# Patient Record
Sex: Male | Born: 1940 | Race: White | Hispanic: No | Marital: Married | Smoking: Former smoker
Health system: Southern US, Community
[De-identification: ages and names within clinical notes are randomized; demographics above are authoritative.]

## PROBLEM LIST (undated history)

## (undated) DIAGNOSIS — E785 Hyperlipidemia, unspecified: Secondary | ICD-10-CM

## (undated) DIAGNOSIS — IMO0002 Reserved for concepts with insufficient information to code with codable children: Secondary | ICD-10-CM

## (undated) DIAGNOSIS — H269 Unspecified cataract: Secondary | ICD-10-CM

## (undated) DIAGNOSIS — M25569 Pain in unspecified knee: Secondary | ICD-10-CM

## (undated) DIAGNOSIS — N400 Enlarged prostate without lower urinary tract symptoms: Secondary | ICD-10-CM

## (undated) DIAGNOSIS — I2510 Atherosclerotic heart disease of native coronary artery without angina pectoris: Secondary | ICD-10-CM

## (undated) DIAGNOSIS — H3530 Unspecified macular degeneration: Secondary | ICD-10-CM

## (undated) DIAGNOSIS — Z8719 Personal history of other diseases of the digestive system: Secondary | ICD-10-CM

## (undated) DIAGNOSIS — I509 Heart failure, unspecified: Secondary | ICD-10-CM

## (undated) DIAGNOSIS — E559 Vitamin D deficiency, unspecified: Secondary | ICD-10-CM

## (undated) DIAGNOSIS — Z87442 Personal history of urinary calculi: Secondary | ICD-10-CM

## (undated) DIAGNOSIS — K5730 Diverticulosis of large intestine without perforation or abscess without bleeding: Secondary | ICD-10-CM

## (undated) DIAGNOSIS — I495 Sick sinus syndrome: Secondary | ICD-10-CM

## (undated) DIAGNOSIS — T7840XA Allergy, unspecified, initial encounter: Secondary | ICD-10-CM

## (undated) DIAGNOSIS — I712 Thoracic aortic aneurysm, without rupture, unspecified: Secondary | ICD-10-CM

## (undated) DIAGNOSIS — H8110 Benign paroxysmal vertigo, unspecified ear: Secondary | ICD-10-CM

## (undated) DIAGNOSIS — E786 Lipoprotein deficiency: Secondary | ICD-10-CM

## (undated) DIAGNOSIS — H6990 Unspecified Eustachian tube disorder, unspecified ear: Secondary | ICD-10-CM

## (undated) DIAGNOSIS — J019 Acute sinusitis, unspecified: Secondary | ICD-10-CM

## (undated) DIAGNOSIS — I359 Nonrheumatic aortic valve disorder, unspecified: Secondary | ICD-10-CM

## (undated) DIAGNOSIS — R51 Headache: Secondary | ICD-10-CM

## (undated) DIAGNOSIS — I428 Other cardiomyopathies: Secondary | ICD-10-CM

## (undated) DIAGNOSIS — I10 Essential (primary) hypertension: Secondary | ICD-10-CM

## (undated) DIAGNOSIS — Z860100 Personal history of colon polyps, unspecified: Secondary | ICD-10-CM

## (undated) DIAGNOSIS — M1990 Unspecified osteoarthritis, unspecified site: Secondary | ICD-10-CM

## (undated) DIAGNOSIS — K219 Gastro-esophageal reflux disease without esophagitis: Secondary | ICD-10-CM

## (undated) DIAGNOSIS — M5126 Other intervertebral disc displacement, lumbar region: Secondary | ICD-10-CM

## (undated) DIAGNOSIS — R269 Unspecified abnormalities of gait and mobility: Secondary | ICD-10-CM

## (undated) DIAGNOSIS — L989 Disorder of the skin and subcutaneous tissue, unspecified: Secondary | ICD-10-CM

## (undated) DIAGNOSIS — E119 Type 2 diabetes mellitus without complications: Secondary | ICD-10-CM

## (undated) DIAGNOSIS — H6980 Other specified disorders of Eustachian tube, unspecified ear: Secondary | ICD-10-CM

## (undated) HISTORY — DX: Unspecified cataract: H26.9

## (undated) HISTORY — DX: Disorder of the skin and subcutaneous tissue, unspecified: L98.9

## (undated) HISTORY — DX: Benign prostatic hyperplasia without lower urinary tract symptoms: N40.0

## (undated) HISTORY — DX: Thoracic aortic aneurysm, without rupture: I71.2

## (undated) HISTORY — DX: Lipoprotein deficiency: E78.6

## (undated) HISTORY — DX: Personal history of colonic polyps: Z86.010

## (undated) HISTORY — DX: Reserved for concepts with insufficient information to code with codable children: IMO0002

## (undated) HISTORY — DX: Other intervertebral disc displacement, lumbar region: M51.26

## (undated) HISTORY — DX: Nonrheumatic aortic valve disorder, unspecified: I35.9

## (undated) HISTORY — DX: Diverticulosis of large intestine without perforation or abscess without bleeding: K57.30

## (undated) HISTORY — DX: Essential (primary) hypertension: I10

## (undated) HISTORY — PX: TONSILLECTOMY: SUR1361

## (undated) HISTORY — DX: Personal history of colon polyps, unspecified: Z86.0100

## (undated) HISTORY — DX: Thoracic aortic aneurysm, without rupture, unspecified: I71.20

## (undated) HISTORY — DX: Unspecified macular degeneration: H35.30

## (undated) HISTORY — DX: Unspecified abnormalities of gait and mobility: R26.9

## (undated) HISTORY — DX: Personal history of other diseases of the digestive system: Z87.19

## (undated) HISTORY — DX: Heart failure, unspecified: I50.9

## (undated) HISTORY — DX: Vitamin D deficiency, unspecified: E55.9

## (undated) HISTORY — DX: Atherosclerotic heart disease of native coronary artery without angina pectoris: I25.10

## (undated) HISTORY — DX: Pain in unspecified knee: M25.569

## (undated) HISTORY — PX: CATARACT EXTRACTION, BILATERAL: SHX1313

## (undated) HISTORY — DX: Gastro-esophageal reflux disease without esophagitis: K21.9

## (undated) HISTORY — DX: Acute sinusitis, unspecified: J01.90

## (undated) HISTORY — PX: COLONOSCOPY W/ POLYPECTOMY: SHX1380

## (undated) HISTORY — DX: Benign paroxysmal vertigo, unspecified ear: H81.10

## (undated) HISTORY — DX: Sick sinus syndrome: I49.5

## (undated) HISTORY — PX: OTHER SURGICAL HISTORY: SHX169

## (undated) HISTORY — DX: Hyperlipidemia, unspecified: E78.5

## (undated) HISTORY — DX: Other cardiomyopathies: I42.8

## (undated) HISTORY — DX: Unspecified eustachian tube disorder, unspecified ear: H69.90

## (undated) HISTORY — DX: Other specified disorders of Eustachian tube, unspecified ear: H69.80

## (undated) HISTORY — DX: Headache: R51

## (undated) HISTORY — DX: Allergy, unspecified, initial encounter: T78.40XA

---
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
## 2003-03-29 ENCOUNTER — Encounter: Payer: Self-pay | Admitting: Family Medicine

## 2005-03-24 HISTORY — PX: DOBUTAMINE STRESS ECHO: SHX5426

## 2006-01-22 HISTORY — PX: ESOPHAGOGASTRODUODENOSCOPY: SHX1529

## 2006-04-02 ENCOUNTER — Encounter: Payer: Self-pay | Admitting: Family Medicine

## 2006-12-23 ENCOUNTER — Ambulatory Visit: Payer: Self-pay | Admitting: Family Medicine

## 2006-12-23 DIAGNOSIS — R51 Headache: Secondary | ICD-10-CM | POA: Insufficient documentation

## 2006-12-23 DIAGNOSIS — Z8719 Personal history of other diseases of the digestive system: Secondary | ICD-10-CM | POA: Insufficient documentation

## 2006-12-23 DIAGNOSIS — I10 Essential (primary) hypertension: Secondary | ICD-10-CM | POA: Insufficient documentation

## 2006-12-23 DIAGNOSIS — R519 Headache, unspecified: Secondary | ICD-10-CM | POA: Insufficient documentation

## 2006-12-23 DIAGNOSIS — N400 Enlarged prostate without lower urinary tract symptoms: Secondary | ICD-10-CM | POA: Insufficient documentation

## 2006-12-23 DIAGNOSIS — I152 Hypertension secondary to endocrine disorders: Secondary | ICD-10-CM | POA: Insufficient documentation

## 2007-06-24 ENCOUNTER — Encounter: Payer: Self-pay | Admitting: Cardiovascular Disease

## 2007-06-24 ENCOUNTER — Ambulatory Visit: Payer: Self-pay | Admitting: Family Medicine

## 2007-06-24 DIAGNOSIS — H8110 Benign paroxysmal vertigo, unspecified ear: Secondary | ICD-10-CM | POA: Insufficient documentation

## 2007-06-24 DIAGNOSIS — I495 Sick sinus syndrome: Secondary | ICD-10-CM | POA: Insufficient documentation

## 2007-07-30 ENCOUNTER — Ambulatory Visit: Payer: Self-pay | Admitting: Cardiology

## 2007-10-08 DIAGNOSIS — I359 Nonrheumatic aortic valve disorder, unspecified: Secondary | ICD-10-CM | POA: Insufficient documentation

## 2007-10-08 DIAGNOSIS — I429 Cardiomyopathy, unspecified: Secondary | ICD-10-CM | POA: Insufficient documentation

## 2007-10-08 DIAGNOSIS — I428 Other cardiomyopathies: Secondary | ICD-10-CM

## 2007-12-06 ENCOUNTER — Ambulatory Visit: Payer: Self-pay | Admitting: Family Medicine

## 2007-12-08 LAB — CONVERTED CEMR LAB
ALT: 19 units/L (ref 0–53)
AST: 24 units/L (ref 0–37)
Albumin: 4 g/dL (ref 3.5–5.2)
Alkaline Phosphatase: 63 units/L (ref 39–117)
BUN: 22 mg/dL (ref 6–23)
Bilirubin, Direct: 0.1 mg/dL (ref 0.0–0.3)
CO2: 30 meq/L (ref 19–32)
Calcium: 9.2 mg/dL (ref 8.4–10.5)
Chloride: 110 meq/L (ref 96–112)
Cholesterol: 171 mg/dL (ref 0–200)
Creatinine, Ser: 1.1 mg/dL (ref 0.4–1.5)
GFR calc Af Amer: 86 mL/min
GFR calc non Af Amer: 71 mL/min
Glucose, Bld: 100 mg/dL — ABNORMAL HIGH (ref 70–99)
HDL: 33.6 mg/dL — ABNORMAL LOW (ref >39.0)
LDL Cholesterol: 123 mg/dL — ABNORMAL HIGH (ref 0–99)
PSA: 0.29 ng/mL (ref 0.10–4.00)
Potassium: 4.2 meq/L (ref 3.5–5.1)
Sodium: 141 meq/L (ref 135–145)
Total Bilirubin: 1.4 mg/dL — ABNORMAL HIGH (ref 0.3–1.2)
Total CHOL/HDL Ratio: 5.1
Total Protein: 7 g/dL (ref 6.0–8.3)
Triglycerides: 73 mg/dL (ref 0–149)
VLDL: 15 mg/dL (ref 0–40)

## 2007-12-09 ENCOUNTER — Ambulatory Visit: Payer: Self-pay | Admitting: Family Medicine

## 2007-12-09 DIAGNOSIS — E786 Lipoprotein deficiency: Secondary | ICD-10-CM | POA: Insufficient documentation

## 2007-12-09 DIAGNOSIS — L989 Disorder of the skin and subcutaneous tissue, unspecified: Secondary | ICD-10-CM | POA: Insufficient documentation

## 2007-12-09 LAB — CONVERTED CEMR LAB
Cholesterol, target level: 200 mg/dL
HDL goal, serum: 40 mg/dL
LDL Goal: 130 mg/dL

## 2008-01-24 ENCOUNTER — Ambulatory Visit: Payer: Self-pay | Admitting: Family Medicine

## 2008-01-24 DIAGNOSIS — IMO0002 Reserved for concepts with insufficient information to code with codable children: Secondary | ICD-10-CM | POA: Insufficient documentation

## 2008-01-24 DIAGNOSIS — M5126 Other intervertebral disc displacement, lumbar region: Secondary | ICD-10-CM | POA: Insufficient documentation

## 2008-02-03 ENCOUNTER — Encounter: Payer: Self-pay | Admitting: Family Medicine

## 2008-02-07 ENCOUNTER — Ambulatory Visit: Payer: Self-pay | Admitting: Family Medicine

## 2008-04-04 ENCOUNTER — Ambulatory Visit: Payer: Self-pay | Admitting: Cardiovascular Disease

## 2008-04-25 ENCOUNTER — Encounter: Payer: Self-pay | Admitting: Cardiovascular Disease

## 2008-04-25 ENCOUNTER — Ambulatory Visit: Payer: Self-pay

## 2008-07-20 ENCOUNTER — Ambulatory Visit: Payer: Self-pay | Admitting: Family Medicine

## 2008-07-20 DIAGNOSIS — IMO0002 Reserved for concepts with insufficient information to code with codable children: Secondary | ICD-10-CM | POA: Insufficient documentation

## 2008-07-20 DIAGNOSIS — M25569 Pain in unspecified knee: Secondary | ICD-10-CM | POA: Insufficient documentation

## 2008-09-01 DIAGNOSIS — E7800 Pure hypercholesterolemia, unspecified: Secondary | ICD-10-CM | POA: Insufficient documentation

## 2008-09-26 ENCOUNTER — Encounter: Payer: Self-pay | Admitting: Cardiovascular Disease

## 2008-09-26 ENCOUNTER — Ambulatory Visit: Payer: Self-pay | Admitting: Cardiovascular Disease

## 2008-09-29 ENCOUNTER — Ambulatory Visit: Payer: Self-pay | Admitting: Cardiology

## 2008-09-29 ENCOUNTER — Encounter: Payer: Self-pay | Admitting: Cardiovascular Disease

## 2008-10-17 LAB — CONVERTED CEMR LAB
Cholesterol: 187 mg/dL (ref 0–200)
HDL: 43 mg/dL (ref >39)
LDL Cholesterol: 126 mg/dL — ABNORMAL HIGH (ref 0–99)
Total CHOL/HDL Ratio: 4.3
Triglycerides: 92 mg/dL (ref <150)
VLDL: 18 mg/dL (ref 0–40)

## 2008-11-21 ENCOUNTER — Ambulatory Visit: Payer: Self-pay | Admitting: Family Medicine

## 2008-11-22 LAB — CONVERTED CEMR LAB
Basophils Absolute: 0.1 10*3/uL (ref 0.0–0.1)
Basophils Relative: 0.8 % (ref 0.0–3.0)
Eosinophils Absolute: 0.1 10*3/uL (ref 0.0–0.7)
Eosinophils Relative: 1.6 % (ref 0.0–5.0)
HCT: 38.7 % — ABNORMAL LOW (ref 39.0–52.0)
Hemoglobin: 13 g/dL (ref 13.0–17.0)
Lymphocytes Relative: 23.7 % (ref 12.0–46.0)
Lymphs Abs: 1.7 10*3/uL (ref 0.7–4.0)
MCHC: 33.5 g/dL (ref 30.0–36.0)
MCV: 93.1 fL (ref 78.0–100.0)
Monocytes Absolute: 0.7 10*3/uL (ref 0.1–1.0)
Monocytes Relative: 9.8 % (ref 3.0–12.0)
Neutro Abs: 4.7 10*3/uL (ref 1.4–7.7)
Neutrophils Relative %: 64.1 % (ref 43.0–77.0)
Platelets: 182 10*3/uL (ref 150.0–400.0)
RBC: 4.16 M/uL — ABNORMAL LOW (ref 4.22–5.81)
RDW: 11.9 % (ref 11.5–14.6)
WBC: 7.3 10*3/uL (ref 4.5–10.5)

## 2008-11-28 ENCOUNTER — Ambulatory Visit: Payer: Self-pay | Admitting: Family Medicine

## 2008-12-01 ENCOUNTER — Telehealth: Payer: Self-pay | Admitting: Family Medicine

## 2008-12-04 ENCOUNTER — Ambulatory Visit: Payer: Self-pay | Admitting: Internal Medicine

## 2008-12-15 ENCOUNTER — Telehealth: Payer: Self-pay | Admitting: Internal Medicine

## 2008-12-19 ENCOUNTER — Ambulatory Visit: Payer: Self-pay | Admitting: Internal Medicine

## 2008-12-19 LAB — HM COLONOSCOPY

## 2009-01-08 ENCOUNTER — Ambulatory Visit: Payer: Self-pay | Admitting: Family Medicine

## 2009-04-05 ENCOUNTER — Encounter: Payer: Self-pay | Admitting: Cardiovascular Disease

## 2009-04-05 ENCOUNTER — Ambulatory Visit: Payer: Self-pay

## 2009-04-13 ENCOUNTER — Ambulatory Visit: Payer: Self-pay | Admitting: Cardiovascular Disease

## 2009-04-16 ENCOUNTER — Ambulatory Visit: Payer: Self-pay | Admitting: Cardiovascular Disease

## 2009-04-19 ENCOUNTER — Encounter: Payer: Self-pay | Admitting: Cardiovascular Disease

## 2009-04-19 LAB — CONVERTED CEMR LAB
Cholesterol: 174 mg/dL (ref 0–200)
HDL: 37 mg/dL — ABNORMAL LOW (ref >39)
LDL Cholesterol: 121 mg/dL — ABNORMAL HIGH (ref 0–99)
Total CHOL/HDL Ratio: 4.7
Triglycerides: 78 mg/dL (ref <150)
VLDL: 16 mg/dL (ref 0–40)

## 2009-04-24 ENCOUNTER — Telehealth: Payer: Self-pay | Admitting: Family Medicine

## 2009-04-26 ENCOUNTER — Ambulatory Visit: Payer: Self-pay | Admitting: Family Medicine

## 2009-04-26 LAB — CONVERTED CEMR LAB
ALT: 21 units/L (ref 0–53)
AST: 24 units/L (ref 0–37)
Albumin: 4.3 g/dL (ref 3.5–5.2)
Alkaline Phosphatase: 77 units/L (ref 39–117)
BUN: 19 mg/dL (ref 6–23)
Basophils Absolute: 0.1 10*3/uL (ref 0.0–0.1)
CO2: 29 meq/L (ref 19–32)
Chloride: 105 meq/L (ref 96–112)
Eosinophils Absolute: 0.6 10*3/uL (ref 0.0–0.7)
Glucose, Bld: 108 mg/dL — ABNORMAL HIGH (ref 70–99)
Lymphocytes Relative: 28.3 % (ref 12.0–46.0)
MCHC: 32.8 g/dL (ref 30.0–36.0)
Monocytes Relative: 9.8 % (ref 3.0–12.0)
PSA: 0.32 ng/mL (ref 0.10–4.00)
Platelets: 182 10*3/uL (ref 150.0–400.0)
Potassium: 4.5 meq/L (ref 3.5–5.1)
RDW: 13.1 % (ref 11.5–14.6)
TSH: 1.69 microintl units/mL (ref 0.35–5.50)
Total Protein: 8.1 g/dL (ref 6.0–8.3)

## 2009-04-27 ENCOUNTER — Ambulatory Visit: Payer: Self-pay | Admitting: Family Medicine

## 2009-04-27 DIAGNOSIS — R269 Unspecified abnormalities of gait and mobility: Secondary | ICD-10-CM | POA: Insufficient documentation

## 2009-04-27 DIAGNOSIS — H6981 Other specified disorders of Eustachian tube, right ear: Secondary | ICD-10-CM | POA: Insufficient documentation

## 2009-04-30 ENCOUNTER — Encounter: Payer: Self-pay | Admitting: Family Medicine

## 2009-05-01 DIAGNOSIS — E559 Vitamin D deficiency, unspecified: Secondary | ICD-10-CM | POA: Insufficient documentation

## 2009-05-01 LAB — CONVERTED CEMR LAB: TSH: 1.134 microintl units/mL (ref 0.350–4.500)

## 2009-05-17 ENCOUNTER — Telehealth: Payer: Self-pay | Admitting: Cardiovascular Disease

## 2009-07-24 ENCOUNTER — Telehealth: Payer: Self-pay | Admitting: Family Medicine

## 2009-10-12 ENCOUNTER — Ambulatory Visit: Payer: Self-pay | Admitting: Cardiovascular Disease

## 2009-10-12 DIAGNOSIS — I712 Thoracic aortic aneurysm, without rupture, unspecified: Secondary | ICD-10-CM | POA: Insufficient documentation

## 2010-02-25 ENCOUNTER — Encounter (INDEPENDENT_AMBULATORY_CARE_PROVIDER_SITE_OTHER): Payer: Self-pay | Admitting: *Deleted

## 2010-02-25 ENCOUNTER — Telehealth: Payer: Self-pay | Admitting: Cardiovascular Disease

## 2010-03-04 ENCOUNTER — Ambulatory Visit: Payer: Self-pay | Admitting: Family Medicine

## 2010-03-04 DIAGNOSIS — J019 Acute sinusitis, unspecified: Secondary | ICD-10-CM | POA: Insufficient documentation

## 2010-03-12 ENCOUNTER — Ambulatory Visit (HOSPITAL_COMMUNITY)
Admission: RE | Admit: 2010-03-12 | Discharge: 2010-03-12 | Payer: Self-pay | Source: Home / Self Care | Attending: Cardiovascular Disease | Admitting: Cardiovascular Disease

## 2010-03-21 DIAGNOSIS — I2510 Atherosclerotic heart disease of native coronary artery without angina pectoris: Secondary | ICD-10-CM | POA: Insufficient documentation

## 2010-03-26 ENCOUNTER — Ambulatory Visit
Admission: RE | Admit: 2010-03-26 | Discharge: 2010-03-26 | Payer: Self-pay | Source: Home / Self Care | Attending: Cardiovascular Disease | Admitting: Cardiovascular Disease

## 2010-03-27 LAB — CONVERTED CEMR LAB
BUN: 16 mg/dL (ref 6–23)
Calcium: 9.1 mg/dL (ref 8.4–10.5)
Creatinine, Ser: 0.8 mg/dL (ref 0.4–1.5)
GFR calc non Af Amer: 96.24 mL/min (ref >60.00)
Potassium: 3.9 meq/L (ref 3.5–5.1)

## 2010-03-28 ENCOUNTER — Ambulatory Visit
Admission: RE | Admit: 2010-03-28 | Discharge: 2010-03-28 | Payer: Self-pay | Source: Home / Self Care | Attending: Cardiovascular Disease | Admitting: Cardiovascular Disease

## 2010-03-29 ENCOUNTER — Other Ambulatory Visit: Payer: Self-pay | Admitting: Cardiovascular Disease

## 2010-03-29 ENCOUNTER — Ambulatory Visit
Admission: RE | Admit: 2010-03-29 | Discharge: 2010-03-29 | Payer: Self-pay | Source: Home / Self Care | Attending: Cardiovascular Disease | Admitting: Cardiovascular Disease

## 2010-03-29 LAB — LIPID PANEL
Cholesterol: 129 mg/dL (ref 0–200)
HDL: 33.9 mg/dL — ABNORMAL LOW (ref >39.00)
LDL Cholesterol: 85 mg/dL (ref 0–99)
Total CHOL/HDL Ratio: 4
Triglycerides: 49 mg/dL (ref 0.0–149.0)
VLDL: 9.8 mg/dL (ref 0.0–40.0)

## 2010-03-29 LAB — HEPATIC FUNCTION PANEL
ALT: 19 U/L (ref 0–53)
AST: 20 U/L (ref 0–37)
Albumin: 3.6 g/dL (ref 3.5–5.2)
Alkaline Phosphatase: 68 U/L (ref 39–117)
Bilirubin, Direct: 0.3 mg/dL (ref 0.0–0.3)
Total Bilirubin: 1.5 mg/dL — ABNORMAL HIGH (ref 0.3–1.2)
Total Protein: 6.4 g/dL (ref 6.0–8.3)

## 2010-04-05 ENCOUNTER — Telehealth: Payer: Self-pay | Admitting: Cardiovascular Disease

## 2010-04-24 NOTE — Miscellaneous (Signed)
  Clinical Lists Changes  Medications: Added new medication of CRESTOR 10 MG TABS (ROSUVASTATIN CALCIUM) Take one tablet by mouth daily.

## 2010-04-24 NOTE — Progress Notes (Signed)
Summary: RX  Phone Note Refill Request Call back at Home Phone [phone] Message from:  Patient on May 17, 2009 12:11 PM  Refills Requested: Medication #1:  CRESTOR 10 MG TABS Take one tablet by mouth daily. [facility] [address] [place]  Initial call taken by: Harlon Flor,  May 17, 2009 12:12 PM    Prescriptions: CRESTOR 10 MG TABS (ROSUVASTATIN CALCIUM) Take one tablet by mouth daily.  #30 x 6   Entered by:   Mercer Pod   Authorized by:   Verne Carrow, MD   Signed by:   Mercer Pod on 05/17/2009   Method used:   Electronically to        [facility] [phone]* (retail)       [address]       [place], [place]  [address]       Ph: [phone]       Fax: [phone]   RxID:   [id number]

## 2010-04-24 NOTE — Assessment & Plan Note (Signed)
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  Hyperlipidemia, poor control: Recnetly started on Crestor10 mg daily.  Prediabetes: glucose increased from last check.    Occ episodes  of "sinking feeling" Sudden loss of energy...lightheaded.  2 episodes in last 6 weeks...lasts seconds.  More frequent when weather bad.  No new medicaitons. No relationship to meals.  CBC, TSH, CMET   Increase in loss of balance in last year, gradual...cannot stand on one leg when dressing, trips over own feet intermittantly. Depth perceptions off some too.  No numbness Fingers and toes cold. No weakness in muscles exccept maybe slightly decrease gripping. Has noted occ memory loss..not severe.  Difficulty recaxlling long term hisotry.  No issue with current events.   Has longterm issues with congestion, inner ear issues...occ feels like fluid in ears. Years ago saw ENT.  Rash at waist sincce August , mild puritics. No blisters. Red, more so after hot shower.No pain.    Hypertension History:      Positive major cardiovascular risk factors include male age 73 years old or older, hyperlipidemia, and hypertension.  Negative major cardiovascular risk factors include no history of diabetes, negative family history for ischemic heart disease, and non-tobacco-user status.        Positive history for target organ damage include ASHD (either angina/prior MI/prior CABG).  Further assessment for target organ damage reveals no history of stroke/TIA, peripheral vascular disease, renal insufficiency, or hypertensive  retinopathy.     Problems Prior to Update: 1)  Sprain and Strain of Unspecified Site of Back  (ICD-847.9) 2)  Sprain and Strain of Ribs  (ICD-848.3) 3)  Change in Bowels  (ICD-787.99) 4)  Hematochezia  (ICD-578.1) 5)  Diverticulitis, Colon  (ICD-562.11) 6)  Cad, Unspecified Site  (ICD-414.00) 7)  Aortic Insufficiency  (ICD-424.1) 8)  Sinus Bradycardia  (ICD-427.81) 9)  Low Hdl  (ICD-272.5) 10)  Hx of Cardiomyopathy, Primary  (ICD-425.4) 11)  Hypertension  (ICD-401.9) 12)  Hyperlipidemia-mixed  (ICD-272.4) 13)  Herniated Lumbar Disc  (ICD-722.10) 14)  Skin Lesions, Multiple  (ICD-709.9) 15)  Benign Positional Vertigo  (ICD-386.11) 16)  Headache  (ICD-784.0) 17)  Benign Prostatic Hypertrophy  (ICD-600.00) 18)  Knee Pain, Right  (ICD-719.46) 19)  Anserine Bursitis, Right  (ICD-726.61) 20)  Back Pain, Lumbar, With Radiculopathy  (ICD-724.4) 21)  Diverticulitis, Hx of  (ICD-V12.79) 22)  Adenomatous Colonic Polyps, Hx of  (ICD-V12.72)  Current Medications (verified): 1)  Ramipril 2.5 Mg  Caps (Ramipril) .... Take 1 Tablet By Mouth Once A Day 2)  Finasteride 5 Mg  Tabs (Finasteride) .... Take 1 Tablet By Mouth Once A Day 3)  Terazosin Hcl 10 Mg Caps (Terazosin Hcl) .... Take 1 Tablet By Mouth Once A Day 4)  Loratadine-D 24hr 10-240 Mg  Tb24 (Loratadine-Pseudoephedrine) .... Take 1 Tablet By Mouth Once A Day 5)  Bayer Low Strength 81 Mg  Tbec (Aspirin) .... Take 1 Tablet By Mouth Once A Day 6)  Glucosamine-Chondroitin 1500-1200 Mg/72ml  Liqd (  Glucosamine-Chondroitin) .... Take 1 Tablet By Mouth Once A Day 7)  Red Yeast Rice 600 Mg Tabs (Red Yeast Rice Extract) .... Takes 2 Tablets By Mouth Once Daily 8)  Fish Oil 1200 Mg Caps (Omega-3 Fatty Acids) .... Take One By Mouth Once Daily 9)  Crestor 10 Mg Tabs (Rosuvastatin Calcium) .... Take One Tablet By Mouth Daily.  Allergies: 1)  ! Sulfa 2)  ! Codeine  Past History:  Past medical, surgical, family and social histories (including  risk factors) reviewed, and no changes noted (except as noted below).  Past Medical History: Reviewed history from 09/01/2008 and no changes required. CAD, UNSPECIFIED SITE (ICD-414.00) AORTIC INSUFFICIENCY (ICD-424.1) SINUS BRADYCARDIA (ICD-427.81) LOW HDL (ICD-272.5) Hx of CARDIOMYOPATHY, PRIMARY (ICD-425.4) HYPERTENSION (ICD-401.9) HYPERLIPIDEMIA-MIXED (ICD-272.4) HERNIATED LUMBAR DISC (ICD-722.10) SKIN LESIONS, MULTIPLE (ICD-709.9) BENIGN POSITIONAL VERTIGO (ICD-386.11) HEADACHE (ICD-784.0) BENIGN PROSTATIC HYPERTROPHY (ICD-600.00) KNEE PAIN, RIGHT (ICD-719.46) ANSERINE BURSITIS, RIGHT (ICD-726.61) BACK PAIN, LUMBAR, WITH RADICULOPATHY (ICD-724.4) DIVERTICULITIS, HX OF (ICD-V12.79) COLONIC POLYPS, HX OF (ICD-V12.72)  Past Surgical History: Reviewed history from 12/04/2008 and no changes required. 03/2003 CT chest neg for PE 07/2003  hosp for chest pain, neg heart cath 09/2003 sleep study 03/2005 stress echo: lateral hypokinesis but no ischemia  PFTs showed diminished lung capacity 2003 PFTS stable in 2005  L knee surgery 1947 01/2006 EGD: Schatzki's ring, non bleeding erosive gastropathy [place]  Family History: Reviewed history from 12/04/2008 and no changes required. father died 65 lung cancer mother died age 67 CHF, CAD, pacemaker, CVA for coumadin 1 sister breast cancer 1 brother overweight MGM: CHF No FH of Colon Cancer:  Social History: Reviewed history from 09/01/2008 and no changes required. 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 CV:  Denies chest pain or discomfort and swelling of feet. Resp:  Denies shortness of breath. GI:  Denies abdominal pain, bloody stools, constipation, and  diarrhea. GU:  Denies dysuria.  Physical Exam  General:  elderly male in NAD  Head:  no maxillary sinus ttp Ears:  External ear exam shows no significant lesions or deformities.  Clear fluid B TMs Nose:  External nasal examination shows no deformity or inflammation. Nasal mucosa are pink and moist without lesions or exudates. Mouth:  Oral mucosa and oropharynx without lesions or exudates.  Teeth in good repair. Neck:  no carotid bruit or thyromegaly no cervical or supraclavicular lymphadenopathy  Lungs:  Normal respiratory effort, chest expands symmetrically. Lungs are clear to auscultation, no crackles or wheezes. Heart:  Normal rate and regular rhythm. S1 and S2 normal without gallop, murmur, click, rub or other extra sounds. Abdomen:  Bowel sounds positive,abdomen soft and non-tender without masses, organomegaly or hernias noted. Rectal:  No external abnormalities noted. Normal sphincter tone. No rectal masses or tenderness. Genitalia:  Testes bilaterally descended without nodularity, tenderness or masses. No scrotal masses or lesions. No penis lesions or urethral discharge. Prostate:  Prostate gland firm and smooth, no enlargement, nodularity, tenderness, mass, asymmetry or induration. Pulses:  R and L posterior tibial pulses are full and equal bilaterally  Extremities:  no edema  Neurologic:  No cranial nerve deficits noted. Station and gait are normal....except cxannot stand on one leg.   Plantar reflexes are down-going bilaterally. DTRs are symmetrical throughout. Sensory, motor and coordinative  functions appear intact.gait normal, DTRs symmetrical and normal, finger-to-nose normal, and heel-to-shin normal.      Impression & Recommendations:  Problem # 1:  GAIT IMBALANCE (ICD-781.2) Less issue with gait and more balancing on one foot.. No other suggestion of cerebellar issues on neuro exam. Nml neuro exam otherwise. ? due to #2. Less likely CVA/TIA but possible. No evidence on exam  of mass lesion...if not imrpoving and labs negative consider MRI brain vs neuro referral.  ? proprioception issue..? caused by knee problems.  Orders: T-Sed Rate (Automated) [phone]) T- * Misc. 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 continbuting to dizzy spells.Marland Kitchenand balance issues..will treat with nasal steroid and follow up in 1 month.  If not better consider ENT referral.   Problem # 3:  CAD, UNSPECIFIED SITE (ICD-414.00) stable per CARDS His updated medication list for this problem includes:    Ramipril 2.5 Mg Caps (Ramipril) .[name]... Take 1 tablet by mouth once a day    Terazosin Hcl 10 Mg Caps (Terazosin hcl) .[name]... Take 1 tablet by mouth once a day    Bayer Low Strength 81 Mg Tbec (Aspirin) .[name]... Take 1 tablet by mouth once a day  Problem # 4:  HYPERTENSION (ICD-401.9)  Well controlled at home on current meds.  His updated medication list for this problem includes:    Ramipril 2.5 Mg Caps (Ramipril) .[name]... Take 1 tablet by mouth once a day    Terazosin Hcl 10 Mg Caps (Terazosin hcl) .[name]... Take 1 tablet by mouth once a day  BP today: 110/90 Prior BP: 104/76 (04/13/2009)  10 Yr Risk Heart Disease: N/A Prior 10 Yr Risk Heart Disease: 22 % (12/09/2007)  Labs Reviewed: K+: 4.5 (04/26/2009) Creat: : 1.1 (04/26/2009)   Chol: 174 (04/16/2009)   HDL: 37 (04/16/2009)   LDL: 121 (04/16/2009)   TG: 78 (04/16/2009)  Problem # 5:  HYPERLIPIDEMIA-MIXED (ICD-272.4) Poor control..started now on crestor. Cards to recehck in 6 months.  His updated medication list for this problem includes:    Crestor 10 Mg Tabs (Rosuvastatin calcium) .[name]... Take one tablet by mouth daily.  Complete Medication List: 1)  Ramipril 2.5 Mg Caps (Ramipril) .... Take 1 tablet by mouth once a day 2)  Finasteride 5 Mg Tabs (Finasteride) .... Take 1 tablet by mouth once a day 3)  Terazosin Hcl 10 Mg Caps (Terazosin hcl) .... Take 1  tablet by mouth once a day 4)  Loratadine-d 24hr 10-240 Mg Tb24 (Loratadine-pseudoephedrine) .... Take 1 tablet by mouth once a day 5)  Bayer Low Strength 81 Mg Tbec (Aspirin) .... Take 1 tablet by mouth once a day 6)  Glucosamine-chondroitin 1500-1200 Mg/26ml Liqd (Glucosamine-chondroitin) .... Take 1 tablet by mouth once a day 7)  Fish Oil 1200 Mg Caps (Omega-3 fatty acids) .... Take one by mouth once daily 8)  Crestor 10 Mg Tabs (Rosuvastatin calcium) .... Take one tablet by mouth daily. 9)  Fluticasone Propionate 50 Mcg/act Susp (Fluticasone propionate) .... 2 sprays nasal daily  Hypertension Assessment/Plan:      The patient's hypertensive risk group is category C: Target organ damage and/or diabetes.  Today's blood pressure is 110/90.  His blood pressure goal is < 140/90.  Patient Instructions: 1)  Call insurance about shingles vaccine.  2)  Please schedule a follow-up appointment in 1 month 30 min balance issues.  3)  Call if any new neurologic symptoms.  Prescriptions: FLUTICASONE PROPIONATE 50 MCG/ACT SUSP (FLUTICASONE PROPIONATE)  2 sprays nasal daily  #1 x 3   Entered and Authorized by:   Kerby Nora MD   Signed by:   Kerby Nora MD on 04/27/2009   Method used:   Electronically to        [facility] [phone]* (retail)       [address]       [place], [place]  [address]       Ph: [phone]       Fax: [phone]   RxID:   [id number]   Current Allergies (reviewed today): ! SULFA ! CODEINE  Last Flu Vaccine:  Fluvax 3+ (12/09/2007 2:22:22 PM) Flu Vaccine Result Date:  12/22/2008 Flu Vaccine Result:  given Flu Vaccine Next Due:  1 yr Hemoccult Next Due:  Not Indicated Last PSA Result:  0.32 (04/26/2009 9:08:29 AM) PSA Next Due:  1 yr  Appended Document: CPX

## 2010-04-24 NOTE — Progress Notes (Signed)
Summary: order for cardiac mri?  Phone Note Call from Patient   Caller: Patient [phone] Reason for Call: Talk to Nurse Summary of Call: pt calling to see if he needs a cardiac mri before next appt Initial call taken by: Glynda Jaeger,  February 25, 2010 11:20 AM

## 2010-04-24 NOTE — Progress Notes (Signed)
Summary: REFILL  Phone Note Refill Request Message from:  Scriptline on Jul 24, 2009 8:23 AM  Refills Requested: Medication #1:  DIFLUNEC SODIUM   Supply Requested: 1 month [facility] [address]   Method Requested: Electronic Initial call taken by: Benny Lennert CMA Duncan Dull),  Jul 24, 2009 8:24 AM  Follow-up for Phone Call        ? diclofenac? yes sorry cant spell Follow-up by: Kerby Nora MD,  Jul 24, 2009 8:33 AM    New/Updated Medications: DICLOFENAC SODIUM 75 MG TBEC (DICLOFENAC SODIUM) 1 tab by mouth two times a day Prescriptions: DICLOFENAC SODIUM 75 MG TBEC (DICLOFENAC SODIUM) 1 tab by mouth two times a day  #60 x 0   Entered and Authorized by:   Kerby Nora MD   Signed by:   Kerby Nora MD on 07/24/2009   Method used:   Electronically to        [facility] [phone]* (retail)       [address]       [place], [place]  [address]       Ph: [phone]       Fax: [phone]   RxID:   [id number]

## 2010-04-24 NOTE — Letter (Signed)
Summary: Appointment - Cardiac MRI  [facility], Main Office  [address]   [place], [place] [address]   Phone: [phone]  Fax: [phone]      February 25, 2010 MRN: [id number]   Dominic Kane [address] [place], [place]  [address]   Dear Mr. Gowin,   We have scheduled the above patient for an appointment for a Cardiac MRI on 03-12-2010 at 1:00 p.m.  Please refer to the below information for the location and instructions for this test:  Location:     [facility]       [address]       [place], [place]  [address] Instructions:    Wilmon Arms at [facility] Outpatient Registration 45 minutes prior to your appointment time.  This will ensure you are in the Radiology Department 30 minutes prior to your appointment.    There are no restrictions for this test you may eat and take medications as usual.  If you need to reschedule this appointment please call at the number listed above.  Sincerely,      Lorne Skeens  [facility] Scheduling Team

## 2010-04-24 NOTE — Assessment & Plan Note (Signed)
Summary: f24m   Visit Type:  Follow-up Referring Provider:  Kerby Nora, MD Primary Provider:  Kerby Nora, MD  CC:  no cp, knee edema from arthritis, and no sob.  History of Present Illness: Dominic Kane is a pleasant 70 year old Caucasian male with a past medical history significant for hypertension, nonobstructive coronary artery disease, idiopathic cardiomyopathy, benign prostatic hypertrophy, hyperlipidemia and hiatal hernia, who presents today for routine cardiac followup. Dominic Kane was seen most recently in our office six months ago.   Upon reviewing all records at the last visit, it seems that Dominic Kane had an episode of chest pain about 5 years ago; at which time, he was evaluated by the [facility] in [place], [place].  He was apparently known to have an ejection fraction of 35-40%, which was felt to be idiopathic.  Left heart catheterization was performed in May 2005 and showed diffuse 15% irregularities in the LAD, diffuse 20% irregularities in the ramus intermedius branch, diffuse 30% irregularities in the nondominant circumflex, and a 50% stenosis in the proximal RCA with irregularities in the midportion of the vessel.  The ascending aorta was mildly dilated with no evidence of dissection during that catheterization.  Ejection fraction was noted to be 65%.  An echocardiogram during that admission in May 2005 in [place] showed an ejection fraction of 55-60% with the ascending aorta at the sinotubular junction measuring 4.1 cm.  We got an echo in February 2010 which showed mild systolic and diastolic dysfunction with LVEF of 45%. There was mild to moderate aortic stenosis.  Follow up echo on January 13,2011  with mild global LV dysfunction, EF 45%, no significant aortic stenosis, mild aortic regurgitation and mildly dilated aortic root (stable in size).    He has been doing well. He denies any chest pain, SOB, DOE, palpitations, near syncope, syncope, orthopnea, PND, LE edema.  He has been watching his diet and eats few fatty foods. His knee has been bothering him but no other complaints.   Current Medications (verified): 1)  Ramipril 2.5 Mg  Caps (Ramipril) .... Take 1 Tablet By Mouth Once A Day 2)  Finasteride 5 Mg  Tabs (Finasteride) .... Take 1 Tablet By Mouth Once A Day 3)  Terazosin Hcl 10 Mg Caps (Terazosin Hcl) .... Take 1 Tablet By Mouth Once A Day 4)  Loratadine-D 24hr 10-240 Mg  Tb24 (Loratadine-Pseudoephedrine) .... Take 1 Tablet By Mouth Once A Day 5)  Bayer Low Strength 81 Mg  Tbec (Aspirin) .... Take 1 Tablet By Mouth Once A Day 6)  Glucosamine-Chondroitin 1500-1200 Mg/83ml  Liqd (Glucosamine-Chondroitin) .... Take 1 Tablet By Mouth Once A Day 7)  Red Yeast Rice 600 Mg Tabs (Red Yeast Rice Extract) .... Takes 2 Tablets By Mouth Once Daily 8)  Fish Oil 1200 Mg Caps (Omega-3 Fatty Acids) .... Take One By Mouth Once Daily  Allergies (verified): 1)  ! Sulfa 2)  ! Codeine  Past History:  Past Medical History: Reviewed history from 09/01/2008 and no changes required. CAD, UNSPECIFIED SITE (ICD-414.00) AORTIC INSUFFICIENCY (ICD-424.1) SINUS BRADYCARDIA (ICD-427.81) LOW HDL (ICD-272.5) Hx of CARDIOMYOPATHY, PRIMARY (ICD-425.4) HYPERTENSION (ICD-401.9) HYPERLIPIDEMIA-MIXED (ICD-272.4) HERNIATED LUMBAR DISC (ICD-722.10) SKIN LESIONS, MULTIPLE (ICD-709.9) BENIGN POSITIONAL VERTIGO (ICD-386.11) HEADACHE (ICD-784.0) BENIGN PROSTATIC HYPERTROPHY (ICD-600.00) KNEE PAIN, RIGHT (ICD-719.46) ANSERINE BURSITIS, RIGHT (ICD-726.61) BACK PAIN, LUMBAR, WITH RADICULOPATHY (ICD-724.4) DIVERTICULITIS, HX OF (ICD-V12.79) COLONIC POLYPS, HX OF (ICD-V12.72)  Social History: Reviewed history from 09/01/2008 and no changes required. 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  The patient denies fatigue, malaise, fever, weight gain/loss, vision loss, decreased hearing, hoarseness, chest pain, palpitations, shortness of breath, prolonged cough, wheezing, sleep apnea, coughing up blood, abdominal pain, blood in stool, nausea, vomiting, diarrhea, heartburn, incontinence, blood in urine, muscle weakness, leg swelling, rash, skin lesions, headache, fainting, dizziness, depression, anxiety, enlarged lymph nodes, easy bruising or bleeding, and environmental allergies.    Vital Signs:  Patient profile:   70 year old male Height:      74 inches Weight:      221.50 pounds BMI:     28.54 Pulse rate:   61 / minute Pulse rhythm:   regular BP sitting:   104 / 76  (left arm) Cuff size:   large  Vitals Entered By: Mercer Pod (April 13, 2009 10:10 AM)  Physical Exam  General:  General: Well developed, well nourished, NAD Musculoskeletal: Muscle strength 5/5 all ext Psychiatric: Mood and affect normal Neck: No JVD, no carotid bruits, no thyromegaly, no lymphadenopathy. Lungs:Clear bilaterally, no wheezes, rhonci, crackles CV: RRR no murmurs, gallops rubs Abdomen: soft, NT, ND, BS present Extremities: No edema, pulses 2+.    Echocardiogram  Procedure date:  04/05/2009  Findings:      Study Conclusions            - Left ventricle: The cavity size was normal. Wall thickness was       increased in a pattern of mild LVH. Systolic function was mildly       reduced. The estimated ejection fraction was 45%. Mild global       hypokinesis. Doppler parameters are consistent with abnormal left       ventricular relaxation (grade 1 diastolic dysfunction).     - Aortic valve: Trileaflet; mildly calcified leaflets. There was no       stenosis.     - Aorta: Mildly dilated aortic root. Aortic root dimension: 42mm       (ED).     - Mitral valve: Trivial regurgitation.     - Left  atrium: The atrium was mildly dilated.     - Right ventricle: The cavity size was normal. Systolic function was       normal.     - Pulmonary arteries: PA peak pressure: 30mm Hg (S).     - Systemic veins: The IVC was not visualized.     Impressions:            - Normal LV size with mildly decreased systolic function, EF 45%       (global hypokinesis). Mild aortic insufficiency and mildly dilated       aortic root.  EKG  Procedure date:  04/13/2009  Findings:      NSR, rate 61 bpm. Non-specific T wave abnormalities.   Impression & Recommendations:  Problem # 1:  CAD, UNSPECIFIED SITE (ICD-414.00) Stable. He has been managed on an ASA and an Ace-inhibitor. We discussed adding a beta blocker but his heart rate is usuallyin in the low 60's so we will hold off for now. He is not on a statin. We have discussed this at prior visits. He has not been willing to start one. We will check fasting lipids next week and then will  call him to discuss statins. He is willing to start a statin now.   His updated medication list for this problem includes:    Ramipril 2.5 Mg Caps (Ramipril) .[name]... Take 1 tablet by mouth once a day    Bayer Low Strength 81 Mg Tbec (Aspirin) .[name]... Take 1 tablet by mouth once a day  His updated medication list for this problem includes:    Ramipril 2.5 Mg Caps (Ramipril) .[name]... Take 1 tablet by mouth once a day    Bayer Low Strength 81 Mg Tbec (Aspirin) .[name]... Take 1 tablet by mouth once a day  Problem # 2:  AORTIC INSUFFICIENCY (ICD-424.1) Mild, stable.  Repeat echo one year.   His updated medication list for this problem includes:    Ramipril 2.5 Mg Caps (Ramipril) .[name]... Take 1 tablet by mouth once a day  His updated medication list for this problem includes:    Ramipril 2.5 Mg Caps (Ramipril) .[name]... Take 1 tablet by mouth once a day  Problem # 3:  Hx of CARDIOMYOPATHY, PRIMARY (ICD-425.4) Stable systolic function. Continue Ace-inhibitor. See discussoin in regards to  beta blocker above.   His updated medication list for this problem includes:    Ramipril 2.5 Mg Caps (Ramipril) .[name]... Take 1 tablet by mouth once a day    Bayer Low Strength 81 Mg Tbec (Aspirin) .[name]... Take 1 tablet by mouth once a day  Problem # 4:  HYPERTENSION (ICD-401.9) Well controlled on current therapy.   His updated medication list for this problem includes:    Ramipril 2.5 Mg Caps (Ramipril) .[name]... Take 1 tablet by mouth once a day    Terazosin Hcl 10 Mg Caps (Terazosin hcl) .[name]... Take 1 tablet by mouth once a day    Bayer Low Strength 81 Mg Tbec (Aspirin) .[name]... Take 1 tablet by mouth once a day  Patient Instructions: 1)  Your physician recommends that you schedule a follow-up appointment in: 6 months 2)  Your physician recommends that you return for a FASTING lipid profile: Monday Prescriptions: TERAZOSIN HCL 10 MG CAPS (TERAZOSIN HCL) Take 1 tablet by mouth once a day  #90 x 3   Entered by:   Charlena Cross, RN, BSN   Authorized by:   Verne Carrow, MD   Signed by:   Charlena Cross, RN, BSN on 04/13/2009   Method used:   Print then Give to Patient   RxID:   [id number] FINASTERIDE 5 MG  TABS (FINASTERIDE) Take 1 tablet by mouth once a day  #90 Each x 3   Entered by:   Charlena Cross, RN, BSN   Authorized by:   Verne Carrow, MD   Signed by:   Charlena Cross, RN, BSN on 04/13/2009   Method used:   Print then Give to Patient   RxID:   [id number] RAMIPRIL 2.5 MG  CAPS (RAMIPRIL) Take 1 tablet by mouth once a day  #90 Each x 3   Entered by:   Charlena Cross, RN, BSN   Authorized by:   Verne Carrow, MD   Signed by:   Charlena Cross, RN, BSN on 04/13/2009   Method used:   Print then Give to Patient   RxID:   [id number]

## 2010-04-24 NOTE — Progress Notes (Signed)
Summary: Rx Terazosin (90 day supply)  Phone Note Refill Request Call back at Work Phone [phone] Message from:  Patient on April 24, 2009 11:17 AM  Refills Requested: Medication #1:  TERAZOSIN HCL 10 MG CAPS Take 1 tablet by mouth once a day   Supply Requested: 3 months Patient request a new Rx, 90 day supply.  Please call when ready for pick up.   Method Requested: Pick up at Office Initial call taken by: Linde Gillis CMA Duncan Dull),  April 24, 2009 11:21 AM  Follow-up for Phone Call        Overdue for CPX.Marland Kitchenrefill until appt scheduled.[name] as below Chol done at pharm but needs fasting CMET, Dx 401.1, PSA Dx v76.44 prior to CPX.  Follow-up by: Kerby Nora MD,  April 24, 2009 12:13 PM  Additional Follow-up for Phone Call Additional follow up Details #1::        Patient advised and he will call and schedule appt Additional Follow-up by: Benny Lennert CMA Duncan Dull),  April 24, 2009 12:24 PM    Prescriptions: TERAZOSIN HCL 10 MG CAPS (TERAZOSIN HCL) Take 1 tablet by mouth once a day  #90 x 0   Entered and Authorized by:   Kerby Nora MD   Signed by:   Kerby Nora MD on 04/24/2009   Method used:   Print then Give to Patient   RxID:   [id number]

## 2010-04-24 NOTE — Assessment & Plan Note (Signed)
Summary: Dominic Kane   Visit Type:  6 months follow up Referring Provider:  Kerby Nora, MD Primary Provider:  Kerby Nora, MD  CC:  None.  History of Present Illness: Dominic Kane is a pleasant 70 year old Caucasian male with a past medical history significant for hypertension, nonobstructive coronary artery disease, idiopathic cardiomyopathy, benign prostatic hypertrophy, hyperlipidemia and hiatal hernia, who presents today for routine cardiac followup. Dominic Kane was seen most recently in our office six months ago.   Upon reviewing all records at the last visit, it seems that Dominic Kane had an episode of chest pain about 5 years ago; at which time, he was evaluated by the [facility] in [place], [place].  He was apparently known to have an ejection fraction of 35-40%, which was felt to be idiopathic.  Left heart catheterization was performed in May 2005 and showed diffuse 15% irregularities in the LAD, diffuse 20% irregularities in the ramus intermedius branch, diffuse 30% irregularities in the nondominant circumflex, and a 50% stenosis in the proximal RCA with irregularities in the midportion of the vessel.  The ascending aorta was mildly dilated with no evidence of dissection during that catheterization.  Ejection fraction was noted to be 65%.  An echocardiogram during that admission in May 2005 in [place] showed an ejection fraction of 55-60% with the ascending aorta at the sinotubular junction measuring 4.1 cm.  We got an echo in February 2010 which showed mild systolic and diastolic dysfunction with LVEF of 45%. There was mild to moderate aortic stenosis.  Follow up echo on January 13,2011  with mild global LV dysfunction, EF 45%, no significant aortic stenosis, mild aortic regurgitation and mildly dilated aortic root (stable in size).    He has been doing well. He denies any chest pain, SOB, DOE, palpitations, near syncope, syncope, orthopnea, PND, LE edema. He has been watching his diet  and eats few fatty foods. He has lost 30 pounds over the last 6 months.  His knee has been bothering him but no other complaints.   Current Medications (verified): 1)  Ramipril 2.5 Mg  Caps (Ramipril) .... Take 1 Tablet By Mouth Once A Day 2)  Finasteride 5 Mg  Tabs (Finasteride) .... Take 1 Tablet By Mouth Once A Day 3)  Terazosin Hcl 10 Mg Caps (Terazosin Hcl) .... Take 1 Tablet By Mouth Once A Day 4)  Loratadine-D 24hr 10-240 Mg  Tb24 (Loratadine-Pseudoephedrine) .... Take 1 Tablet By Mouth Once A Day 5)  Bayer Low Strength 81 Mg  Tbec (Aspirin) .... Take 1 Tablet By Mouth Once A Day 6)  Glucosamine-Chondroitin 1500-1200 Mg/68ml  Liqd (Glucosamine-Chondroitin) .... Take 1 Tablet By Mouth Once A Day 7)  Fish Oil 1200 Mg Caps (Omega-3 Fatty Acids) .... Take One By Mouth Once Daily 8)  Crestor 10 Mg Tabs (Rosuvastatin Calcium) .... Take One Tablet By Mouth Daily. 9)  Fluticasone Propionate 50 Mcg/act Susp (Fluticasone Propionate) .... As Needed  Allergies: 1)  ! Sulfa 2)  ! Codeine  Past History:  Past Medical History: Dilated aortic root CAD, UNSPECIFIED SITE (ICD-414.00) AORTIC INSUFFICIENCY (ICD-424.1)-mild SINUS BRADYCARDIA (ICD-427.81) LOW HDL (ICD-272.5) Hx of CARDIOMYOPATHY, PRIMARY (ICD-425.4) HYPERTENSION (ICD-401.9) HYPERLIPIDEMIA-MIXED (ICD-272.4) HERNIATED LUMBAR DISC (ICD-722.10) SKIN LESIONS, MULTIPLE (ICD-709.9) BENIGN POSITIONAL VERTIGO (ICD-386.11) HEADACHE (ICD-784.0) BENIGN PROSTATIC HYPERTROPHY (ICD-600.00) KNEE PAIN, RIGHT (ICD-719.46) ANSERINE BURSITIS, RIGHT (ICD-726.61) BACK PAIN, LUMBAR, WITH RADICULOPATHY (ICD-724.4) DIVERTICULITIS, HX OF (ICD-V12.79) COLONIC POLYPS, HX OF (ICD-V12.72)  Social History: Reviewed history from 09/01/2008 and no changes required. 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 Never Smoked Alcohol use-yes, wine glasses every few days Regular exercise-yes, bicycle 4 x per week Diet:  fruit and veggies, 40 lb weight loss in lsat 2 years Drug Use - no  Review of Systems  The patient denies fatigue, malaise, fever, weight gain/loss, vision loss, decreased hearing, hoarseness, chest pain, palpitations, shortness of breath, prolonged cough, wheezing, sleep apnea, coughing up blood, abdominal pain, blood in stool, nausea, vomiting, diarrhea, heartburn, incontinence, blood in urine, muscle weakness, joint pain, leg swelling, rash, skin lesions, headache, fainting, dizziness, depression, anxiety, enlarged lymph nodes, easy bruising or bleeding, and environmental allergies.    Vital Signs:  Patient profile:   70 year old male Height:      74 inches Weight:      191.50 pounds BMI:     24.68 Pulse rate:   55 / minute Pulse rhythm:   regular Resp:     18 per minute BP sitting:   108 / 80  (left arm) Cuff size:   large  Vitals Entered By: Vikki Ports (October 12, 2009 9:13 AM)  Physical Exam  General:  General: Well developed, well nourished, NAD Neuro: No focal deficits Musculoskeletal: Muscle strength 5/5 all ext Psychiatric: Mood and affect normal Neck: No JVD, no carotid bruits, no thyromegaly, no lymphadenopathy. Lungs:Clear bilaterally, no wheezes, rhonci, crackles CV: RRR no murmurs, gallops rubs Abdomen: soft, NT, ND, BS present Extremities: No edema, pulses 2+.    EKG  Procedure date:  10/12/2009  Findings:      Sinus bradycardia, rate 55bpm.   Impression & Recommendations:  Problem # 1:  AORTIC INSUFFICIENCY (ICD-424.1)  Stable.   His updated medication list for this problem includes:    Ramipril 2.5 Mg Caps (Ramipril) .[name]... 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   Problem # 3:  CAD, UNSPECIFIED SITE (ICD-414.00) Stable.   His updated medication list for this problem includes:     Ramipril 2.5 Mg Caps (Ramipril) .[name]... Take 1 tablet by mouth once a day    Bayer Low Strength 81 Mg Tbec (Aspirin) .[name]... Take 1 tablet by mouth once a day  Other Orders: EKG w/ Interpretation (93000) MRA (MRA)  Patient Instructions: 1)  Your physician recommends that you schedule a follow-up appointment in: 6 months after MRI/MRA 2)  Your physician recommends that you return for lab work week prior to test. 3)  Your physician has requested that you have a cardiac MRI and MRA. Cardiac MRI uses a computer to create images of your heart as it's beating, producing both still and moving pictures of your heart and major blood vessels. For further information please visit  [email or web address]  Please follow the instruction sheet given to you today for more information. To be done in 6 months

## 2010-04-25 NOTE — Progress Notes (Signed)
Summary: test results  Phone Note Call from Patient Call back at Work Phone [phone]   Caller: Patient Summary of Call: Pt returning call for test results Initial call taken by: Judie Grieve,  April 05, 2010 10:18 AM  Follow-up for Phone Call        patient is aware of test results. Whitney Maeola Sarah RN  April 05, 2010 10:58 AM  Follow-up by: Whitney Maeola Sarah RN,  April 05, 2010 10:58 AM

## 2010-04-25 NOTE — Assessment & Plan Note (Signed)
Summary: CONGESTION,FEVER/CLE   Vital Signs:  Patient profile:   70 year old male Height:      74 inches Weight:      198 pounds BMI:     25.51 Temp:     98.5 degrees F oral Pulse rate:   80 / minute Pulse rhythm:   regular BP sitting:   102 / 60  (left arm) Cuff size:   regular  Vitals Entered By: Delilah Shan CMA Duncan Dull) (March 04, 2010 2:55 PM) CC: Congestion, fever   History of Present Illness: Started about 5 weeks ago with cold or allergies.  Still with symptoms, worse at night.  Temp up to 101.  Inc in pressure in face when bending over.  Yellow discharge from now.  Ears feel plugged up.  Cough with sputum.   Allergies: 1)  ! Sulfa 2)  ! 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  Otherwise negative.    Physical Exam  General:  GEN: nad, alert and oriented HEENT: mucous membranes moist, TM w/o erythema, nasal epithelium injected, OP with cobblestoning NECK: supple w/o LA CV: rrr. PULM: ctab, no inc wob ABD: soft, +bs EXT: no edema  max sinus tender to palpation    Impression & Recommendations:  Problem # 1:  SINUSITIS - ACUTE-NOS (ICD-461.9) Start amoxil today and continue supportive tx.  Nontoxic and okay for outpatient follow up.  He agrees.  See instructions.  His updated medication list for this problem includes:    Loratadine-d 24hr 10-240 Mg Tb24 (Loratadine-pseudoephedrine) .[name]... Take 1 tablet by mouth once a day    Fluticasone Propionate 50 Mcg/act Susp (Fluticasone propionate) .[name]... As needed    Amoxicillin 875 Mg Tabs (Amoxicillin) .[name]... 1 by mouth two times a day    Hydrocodone-homatropine 5-1.5 Mg/67ml Syrp (Hydrocodone-homatropine) .Marland KitchenMarland KitchenMarland KitchenMarland Kitchen 5 ml by mouth at bedtime as needed  for cough  Orders: Prescription Created Electronically [phone])  Complete Medication List: 1)  Ramipril 2.5 Mg Caps (Ramipril) .... Take 1 tablet by mouth once a day 2)  Finasteride 5 Mg Tabs (Finasteride) .... Take 1 tablet by mouth once a day 3)  Terazosin Hcl 10 Mg Caps (Terazosin hcl) .... Take 1 tablet by mouth once a day 4)  Loratadine-d 24hr 10-240 Mg Tb24 (Loratadine-pseudoephedrine) .... Take 1 tablet by mouth once a day 5)  Bayer Low Strength 81 Mg Tbec (Aspirin) .... Take 1 tablet by mouth once a day 6)  Glucosamine-chondroitin 1500-1200 Mg/63ml Liqd (Glucosamine-chondroitin) .... Take 1 tablet by mouth once a day 7)  Fish Oil 1200 Mg Caps (Omega-3 fatty acids) .... Take one by mouth once daily 8)  Crestor 10 Mg Tabs (Rosuvastatin calcium) .... Take one tablet by mouth daily. 9)  Fluticasone Propionate 50 Mcg/act Susp (Fluticasone propionate) .... As needed 10)  Amoxicillin 875 Mg Tabs (Amoxicillin) .[name].. 1 by mouth two times a day 11)  Hydrocodone-homatropine 5-1.5 Mg/75ml Syrp (Hydrocodone-homatropine) .... 5 ml by mouth at bedtime as needed for cough  Patient Instructions: 1)  Get plenty of rest, drink lots of clear liquids, and use Tylenol for fever and comfort. Start the antibiotics today and use the cough medicine as needed.  It can make you drowsy.  Take care.  Prescriptions: HYDROCODONE-HOMATROPINE 5-1.5 MG/5ML SYRP (HYDROCODONE-HOMATROPINE) 5 ml by mouth at bedtime as needed for cough  #6oz x 0   Entered and Authorized by:   Crawford Givens MD   Signed by:   Crawford Givens MD on 03/04/2010   Method used:   Print then Give to Patient   RxID:   [id number] AMOXICILLIN 875 MG TABS (AMOXICILLIN) 1 by mouth two times a day  #20 x 0   Entered and Authorized by:   Crawford Givens MD   Signed by:   Crawford Givens MD on 03/04/2010   Method used:   Electronically to        [facility] [phone]* (retail)       [address]       [place], [place]  [address]       Ph:  [phone]       Fax: [phone]   RxID:   [id number]    Orders Added: 1)  Est. Patient Level III [34742] 2)  Prescription Created Electronically [phone]    Current Allergies (reviewed today): ! SULFA ! CODEINE

## 2010-04-25 NOTE — Assessment & Plan Note (Signed)
Summary: per check out/sf   Visit Type:  rov Referring Provider:  Kerby Nora, MD Primary Provider:  Kerby Nora, MD  CC:  no chest pain.  History of Present Illness: [name] is a pleasant 70 year-old Caucasian male with a past medical history significant for hypertension, nonobstructive coronary artery disease, idiopathic cardiomyopathy, benign prostatic hypertrophy, hyperlipidemia and hiatal hernia, who presents today for routine cardiac followup. Mr. Halley was seen most recently in our office six months ago.  [name] had an episode of chest pain about 5 years ago; at which time, he was evaluated by the [facility] in [place], [place].  He was apparently known to have an ejection fraction of 35-40%, which was felt to be idiopathic.  Left heart catheterization was performed in May 2005 and showed diffuse 15% irregularities in the LAD, diffuse 20% irregularities in the ramus intermedius branch, diffuse 30% irregularities in the nondominant circumflex, and a 50% stenosis in the proximal RCA with irregularities in the midportion of the vessel.  The ascending aorta was mildly dilated with no evidence of dissection during that catheterization.  Ejection fraction was noted to be 65%.  An echocardiogram during that admission in May 2005 in [place] showed an ejection fraction of 55-60% with the ascending aorta at the sinotubular junction measuring 4.1 cm.  We got an echo in February 2010 which showed mild systolic and diastolic dysfunction with LVEF of 45%. There was mild to moderate aortic stenosis.  Follow up echo on January 13,2011  with mild global LV dysfunction, EF 45%, no significant aortic stenosis, mild aortic regurgitation and mildly dilated aortic root (stable in size). At the last visit I ordered a cardiac MRI which showed mild enlargement of the aortic root and EF of 48%.    He has been doing well. He denies any chest pain, SOB, DOE, palpitations, near syncope, syncope,  orthopnea, PND, LE edema. He has been watching his diet and eats few fatty foods.   Current Medications (verified): 1)  Ramipril 2.5 Mg  Caps (Ramipril) .... Take 1 Tablet By Mouth Once A Day 2)  Finasteride 5 Mg  Tabs (Finasteride) .... Take 1 Tablet By Mouth Once A Day 3)  Terazosin Hcl 10 Mg Caps (Terazosin Hcl) .... Take 1 Tablet By Mouth Once A Day 4)  Loratadine-D 24hr 10-240 Mg  Tb24 (Loratadine-Pseudoephedrine) .... Take 1 Tablet By Mouth Once A Day 5)  Bayer Low Strength 81 Mg  Tbec (Aspirin) .... Take 1 Tablet By Mouth Once A Day 6)  Glucosamine-Chondroitin 1500-1200 Mg/52ml  Liqd (Glucosamine-Chondroitin) .... 2 Tab Once Daily 7)  Fish Oil 1200 Mg Caps (Omega-3 Fatty Acids) .... 2 Caps Once Daily 8)  Crestor 10 Mg Tabs (Rosuvastatin Calcium) .... Take One Tablet By Mouth Daily. 9)  Fluticasone Propionate 50 Mcg/act Susp (Fluticasone Propionate) .... As Needed  Allergies: 1)  ! Sulfa 2)  ! Codeine  Past History:  Past Medical History: Reviewed history from 03/21/2010 and no changes required. 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) UNSPECIFIED VITAMIN D DEFICIENCY (ICD-268.9) EUSTACHIAN TUBE DYSFUNCTION, BILATERAL (ICD-381.81) GAIT IMBALANCE (ICD-781.2) AORTIC INSUFFICIENCY (ICD-424.1) SINUS BRADYCARDIA (ICD-427.81) LOW HDL (ICD-272.5) Hx of CARDIOMYOPATHY, PRIMARY (ICD-425.4) HYPERTENSION (ICD-401.9) HYPERLIPIDEMIA-MIXED (ICD-272.4) HERNIATED LUMBAR DISC (ICD-722.10) SKIN LESIONS, MULTIPLE (ICD-709.9) BENIGN POSITIONAL VERTIGO (ICD-386.11) HEADACHE (ICD-784.0) BENIGN PROSTATIC HYPERTROPHY (ICD-600.00) KNEE PAIN, RIGHT (ICD-719.46) ANSERINE BURSITIS, RIGHT (ICD-726.61) BACK PAIN, LUMBAR, WITH RADICULOPATHY (ICD-724.4) DIVERTICULITIS, HX OF (ICD-V12.79) ADENOMATOUS COLONIC POLYPS, HX OF (ICD-V12.72)  Social History: Reviewed history from 03/04/2010 and no changes required. 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; Clinical research associate Married 1 daughter Never Smoked Alcohol use-yes, wine glasses every few days Regular exercise-yes, bicycle 4 x per week Diet: fruit and veggies, 40 lb weight loss in lsat 2 years Drug Use - no  Review of Systems  The patient denies fatigue, malaise, fever, weight gain/loss, vision loss, decreased hearing, hoarseness, chest pain, palpitations, shortness of breath, prolonged cough, wheezing, sleep apnea, coughing up blood, abdominal pain, blood in stool, nausea, vomiting, diarrhea, heartburn, incontinence, blood in urine, muscle weakness, joint pain, leg swelling, rash, skin lesions, headache, fainting, dizziness, depression, anxiety, enlarged lymph nodes, easy bruising or bleeding, and environmental allergies.    Vital Signs:  Patient profile:   70 year old male Height:      74 inches Weight:      200.50 pounds BMI:     25.84 Pulse rate:   72 / minute Pulse rhythm:   regular BP sitting:   104 / 70  (left arm) Cuff size:   regular  Vitals Entered By: Danielle Rankin, CMA (March 28, 2010 12:14 PM)  Physical Exam  General:  General: Well developed, well nourished, NAD Musculoskeletal: Muscle strength 5/5 all ext Psychiatric: Mood and affect normal Neck: No JVD, no carotid bruits, no thyromegaly, no lymphadenopathy. Lungs:Clear bilaterally, no wheezes, rhonci, crackles CV: RRR no murmurs, gallops rubs Abdomen: soft, NT, ND, BS present Extremities: No edema, pulses 2+.    MRI EXAM  Procedure date:  03/12/2010  Findings:      Measurements:   LV EDV 159 mL   LV SV 77 mL   LV EF 48%   Aortic root 4.2 cm   Ascending aorta at maximal dimension 4.3 cm   Aortic arch 2.8 cm   Isthmus 2.7 cm   Descending thoracic aorta 2.7 cm   IMPRESSION: 1.  Normal LV size with mild global hypokinesis, EF 48%.   2. Normal RV size with mild systolic dysfunction.   3. No myocardial delayed enhancement, so no definite evidence for infiltrative  disease, myocarditis, or prior myocardial infarction.   4. Mildly dilated aortic root and ascending aorta in the setting of a trileaflet aortic valve and mild aortic insufficiency.  Impression & Recommendations:  Problem # 1:  CAD, NATIVE VESSEL (ICD-414.01) STable. No changdes in therapy.   His updated medication list for this problem includes:    Ramipril 2.5 Mg Caps (Ramipril) .[name]... Take 1 tablet by mouth once a day    Bayer Low Strength 81 Mg Tbec (Aspirin) .[name]... 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 Repeat imaging with echo in one year.   Problem # 3:  HYPERLIPIDEMIA-MIXED (ICD-272.4) Fasting lipids and LFTs. Continue statin.   His updated medication list for this problem includes:    Crestor 10 Mg Tabs (Rosuvastatin calcium) .[name]... Take one tablet by mouth daily.  Patient Instructions: 1)  Your physician recommends that you schedule a follow-up appointment in: 1 year 2)  Your physician recommends that you continue on your current medications as directed. Please refer to the Current Medication list given to you today. 3)  Your physician recommends that you return for a FASTING lipid profile and liver function profile in 1-2 weeks.  Prescriptions: CRESTOR 10 MG TABS (ROSUVASTATIN CALCIUM) Take one tablet by mouth daily.  #90 x 3   Entered by:   Whitney Maeola Sarah RN  Authorized by:   Verne Carrow, MD   Signed by:   Whitney Maeola Sarah RN on 03/28/2010   Method used:   Print then Give to Patient   RxID:   [id number] RAMIPRIL 2.5 MG  CAPS (RAMIPRIL) Take 1 tablet by mouth once a day  #90 x 3   Entered by:   Whitney Maeola Sarah RN   Authorized by:   Verne Carrow, MD   Signed by:   Whitney Maeola Sarah RN on 03/28/2010   Method used:   Print then Give to Patient   RxID:   [id number] FINASTERIDE 5 MG  TABS (FINASTERIDE) Take 1 tablet by mouth once a day  #90 x 3   Entered by:   Whitney Maeola Sarah RN    Authorized by:   Verne Carrow, MD   Signed by:   Whitney Maeola Sarah RN on 03/28/2010   Method used:   Print then Give to Patient   RxID:   [id number] RAMIPRIL 2.5 MG  CAPS (RAMIPRIL) Take 1 tablet by mouth once a day  #30 x 12   Entered by:   Whitney Maeola Sarah RN   Authorized by:   Verne Carrow, MD   Signed by:   Ellender Hose RN on 03/28/2010   Method used:   Electronically to        [facility] [phone]* (retail)       [address]       [place], [place]  [address]       Ph: [phone]       Fax: [phone]   RxID:   [id number] CRESTOR 10 MG TABS (ROSUVASTATIN CALCIUM) Take one tablet by mouth daily.  #30 x 12   Entered by:   Whitney Maeola Sarah RN   Authorized by:   Verne Carrow, MD   Signed by:   Ellender Hose RN on 03/28/2010   Method used:   Electronically to        [facility] [phone]* (retail)       [address]       [place], [place]  [address]       Ph: [phone]       Fax: [phone]   RxID:   [id number] FINASTERIDE 5 MG  TABS (FINASTERIDE) Take 1 tablet by mouth once a day  #30 x 12   Entered by:   Whitney Maeola Sarah RN   Authorized by:   Verne Carrow, MD   Signed by:   Ellender Hose RN on 03/28/2010   Method used:   Electronically to        [facility] [phone]* (retail)       [address]       [place], [place]  [address]       Ph: [phone]       Fax: [phone]   RxID:   [id number]

## 2010-06-03 LAB — BUN: BUN: 12 mg/dL (ref 6–23)

## 2010-07-03 ENCOUNTER — Encounter: Payer: Self-pay | Admitting: Family Medicine

## 2010-07-04 ENCOUNTER — Encounter: Payer: Self-pay | Admitting: Family Medicine

## 2010-07-04 ENCOUNTER — Ambulatory Visit (INDEPENDENT_AMBULATORY_CARE_PROVIDER_SITE_OTHER): Payer: Medicare Other | Admitting: Family Medicine

## 2010-07-04 DIAGNOSIS — IMO0002 Reserved for concepts with insufficient information to code with codable children: Secondary | ICD-10-CM

## 2010-07-04 DIAGNOSIS — M5126 Other intervertebral disc displacement, lumbar region: Secondary | ICD-10-CM

## 2010-07-04 MED ORDER — CYCLOBENZAPRINE HCL 10 MG PO TABS: 10.0000 mg | ORAL_TABLET | Freq: Three times a day (TID) | ORAL | Status: AC | PRN

## 2010-07-04 MED ORDER — TRAMADOL HCL 50 MG PO TABS: 50.0000 mg | ORAL_TABLET | Freq: Four times a day (QID) | ORAL | Status: AC | PRN

## 2010-07-04 NOTE — Progress Notes (Signed)
70 year old with back pain:  Pain Monday was bad, then got a lot worse. Hurt to pick up or move either leg.  Has been taking some motrin.  No injury.  No trauma.   Radiculopathy to the knee.  7-8/10 at worst, now 3/10  The above patient presents with back pain that has been ongoing for approximately: 4 days The patient has had back pain before and radiculopathy The back pain is localized into the lumbar spine area down to R leg.  No bowel or bladder incontinence. No focal weakness. Prior interventions: none Physical therapy: No Chiropractic manipulations: no Acupuncture: No Osteopathic manipulation: No Heat or cold: Minimal effect  REVIEW OF SYSTEMS  GEN: No fevers, chills. Nontoxic. Primarily MSK c/o today. MSK: Detailed in the HPI GI: tolerating PO intake without difficulty Neuro: As above  Otherwise the pertinent positives of the ROS are noted above.    The PMH, PSH, Social History, Family History, Medications, and allergies have been reviewed in [facility], and have been updated if relevant.  Gen: Well-developed,well-nourished,in no acute distress; alert,appropriate and cooperative throughout examination HEENT: Normocephalic and atraumatic without obvious abnormalities.  Ears, externally no deformities Pulm: Breathing comfortably in no respiratory distress Range of motion at  the waist: Flexion: nml Extension: nml Rotation: nml  No echymosis or edema Rises to examination table with no difficulty Gait: non antalgic  Inspection/Deformity: N Paraspinus T: nt  B Ankle Dorsiflexion (L5,4): 5/5 B Great Toe Dorsiflexion (L5,4): 5/5 Heel Walk (L5): WNL Toe Walk (S1): WNL Rise/Squat (L4): WNL  SENSORY B Medial Foot (L4): WNL B Dorsum (L5): WNL B Lateral (S1): WNL Light Touch: WNL Pinprick: WNL  REFLEXES Knee (L4): 2+ Ankle (S1): 2+  B SLR, seated: neg B SLR, supine: neg B FABER: neg B Reverse FABER: neg B Greater Troch: NT B Log Roll: neg B Stork: NT B  Sciatic Notch: NT Leg Lengths: equal    Assessment and plan: Lumbar back pain with radiculopathy, sciatica  I reviewed with the patient the structures involved and how they related to their diagnosis.   Conservative algorithms for acute back pain generally begin with the following: NSAIDS Muscle Relaxants Mild pain medication  If not progressing, these modalities can be helpful in select cases. Chiropractic or Osteopathic Manipulation Accupuncture  Start with medications, core rehab, and progress from there following low back pain algorithm.  No red flags are present.

## 2010-07-04 NOTE — Patient Instructions (Signed)
MOTRIN 4 TABLETS, 3 TIMES A DAY TRAMADOL FOR PAIN IF YOU NEED IT CYCLOBENZAPRINE AT NIGHT - CAN MAKE YOU DROWSY

## 2010-08-06 NOTE — Assessment & Plan Note (Signed)
[facility] OFFICE NOTE   NAME:Dominic Kane, Dominic Kane                       MRN:          [id number]  DATE:07/30/2007                            DOB:          December 30, 1940    PRIMARY CARE PHYSICIAN:  Kerby Nora, M.D.   REASON FOR PRESENTATION:  Bradycardia.   HISTORY OF PRESENT ILLNESS:  The patient is pleasant 70 year old  gentleman whose cardiac history includes chest discomfort about 5 or 6  years ago.  He was  evaluated lived elsewhere.  He had a cardiac  catheterization.  He reports mild disease.  He has not required any  further cardiovascular testing.  He has been battling vertigo.  He gets  these episodes relatively frequently and they sound significant.  He  said in the last 3 months he has had two episodes of his blood pressure  dropping and his heart rate dropping while he has been in the middle one  his vertigo spells.  Vertigo spells last for days.  He has noticed again  twice that while up and moving during the vertigo episode, he has  noticed all the energy drain out of my feet.  He knows he needs to sit  down.  He has taken his blood pressure and pulse from a typical 120-130  range to systolics in the 100-110 range.  The heart rate which is  typically in the 80s falls to the 50 silk.  Again this has happened  twice.  He has not lost consciousness.  It passes after 3 or 4.  He does  not get chest discomfort, neck or arm discomfort.  He does not describe  any new shortness of breath.  He does not break out in a sweat.  He  cannot bring these on.  Otherwise he is active if he is not having  vertigo.  He can do heavy lifting and walking in his job.  With this he  denies any chest pressure, neck or arm discomfort.  He does not  typically have any palpitations.  He has some baseline shortness of  breath and been told he has some lung disease.  However, does not have  any PND or orthopnea typically.   PAST  MEDICAL HISTORY:  1. Diverticulitis.  2. Hypertension times 10 years.  3. Benign prostatic hypertrophy.  4. Seasonal headache.   PAST SURGICAL HISTORY:  1. Left knee surgery.  2. Tonsillectomy.   ALLERGIES/INTOLERANCES:  SULFA.   MEDICATIONS:  1. Ramipril 2.5 mg daily.  2. Finasteride 5 mg daily.  3. Terazosin 5 mg daily.  4. Slo-Niacin 5 mg daily.  5. Ranitidine.  6. Loratadine.  7. Fish oil.  8. Omega 3.  9. Glucosamine chondroitin.   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  He is married.  Has  one child.  He was relocated here.  His 61 year old mother-in-law lives  with them.   FAMILY HISTORY:  Noncontributory for early coronary artery disease.  His  mother had heart failure, dying at age 19.   REVIEW OF SYSTEMS:  As stated in the  HPI, positive for reflux, colitis,  seasonal allergies.  Negative for all other systems.   PHYSICAL EXAMINATION:  VITAL SIGNS:  The patient is in no distress.  Blood pressure 126/73, heart rate 83 and regular, body mass index 27.5.  HEENT:  Pupils equal, round, reactive.  Fundi not visualized, oral  mucosa normal.  NECK:  No jugular distention at 45 degrees.  Carotid upstroke brisk and  symmetrical.  No bruits.  No thyromegaly.  LYMPHATICS:  No cervical, axillary, inguinal nodes.  LUNGS:  Clear to auscultation bilaterally.  BACK:  No costovertebral angle tenderness.  CHEST:  Normal.  HEART:  PMI not displaced or sustained.  S1 and S2 within normal limits.  No S3, S4.  No clicks, rubs, murmurs.  ABDOMEN:  Flat, positive bowel sounds.  Normal in frequency and pitch.  No bruits, rebound, guarding, or midline pulsatile mass.  No hepatomegaly, no splenomegaly.  SKIN:  No rashes, no nodules.  EXTREMITIES:  2+ pulses throughout.  No edema, cyanosis or clubbing.  NEURO:  Oriented to person, place and time.  Cranial nerves II-XII  grossly intact.  Motor grossly intact.   An EKG (done Dr. Daphine Deutscher office) showed sinus rhythm, rate  66, axis  within normal limits, intervals within normal limits, early transition  lead V1.  No acute ST wave changes.   ASSESSMENT/PLAN:  1. Bradycardia.  The patient has had two episodes of bradycardia and      low blood pressure in the midst of vertigo spells.  He has a normal      physical exam.  His EKG demonstrates some mild abnormality with      early transition in lead V1 and a very slightly prolonged QT.      However, I do not suspect tachy arrhythmia for any of his symptoms.      This sounds more like a vagal episode.  At this point he has only      had two and they were short lived.  I would not suggest further      cardiovascular testing unless he has recurrence of these.  We      discussed this.  At that point we would probably apply a monitor of      some type.  He will let me know if he has worsening symptoms.  2. Hypertension.  Blood pressure is controlled on medications as      listed.  I doubt that he is having any orthostatic symptoms.  He      can remain on these medications.  3. Weight.  He is very slightly overweight, looks fit and I do not      suggest any specific change.  4. Follow up.  Will see back as needed.     Rollene Rotunda, MD, [facility]  Electronically Signed    JH/MedQ  DD: 07/30/2007  DT: 07/30/2007  Job #: [id number]   cc:   Kerby Nora, MD

## 2010-08-06 NOTE — Assessment & Plan Note (Signed)
[facility] OFFICE NOTE   NAME:Dominic Kane, Dominic Kane                       MRN:          [id number]  DATE:04/04/2008                            DOB:          12-Nov-1940    PRIMARY CARE PHYSICIAN:  Kerby Nora, MD   HISTORY OF PRESENT ILLNESS:  Mr. Vanscyoc is a pleasant 70 year old  Caucasian male with a past medical history significant for hypertension,  nonobstructive coronary artery disease, idiopathic cardiomyopathy, mild  aortic insufficiency, benign prostatic hypertrophy, hyperlipidemia and  hiatal hernia, who presents today for routine cardiac followup.  Mr.  Creekmore was seen most recently in our office in May 2009 by Dr. Angelina Sheriff.  Dr. Antoine Poche is no longer seeing patients in this office and  because of that, Mr. Merrick is here to see me today.  Upon reviewing all  records, it seems that Mr. Stiehl had an episode of chest pain about 5  years ago; at which time, he was evaluated by the [facility] in  [place], [place].  He was apparently known to have an ejection  fraction of 35-40%, which was felt to be idiopathic.  Left heart  catheterization was performed in May 2005 and showed diffuse 15%  irregularities in the LAD, diffuse 20% irregularities in the ramus  intermedius branch, diffuse 30% irregularities in the nondominant  circumflex, and a 50% stenosis in the proximal RCA with irregularities  in the midportion of the vessel.  The ascending aorta was mildly dilated  with no evidence of dissection during that catheterization.  Ejection  fraction was noted to be 65%.  An echocardiogram during that admission  in May 2005 in [place] showed an ejection fraction of 55-60% with the  ascending aorta at the sinotubular junction measuring 4.1 cm.  The  patient has done well since his discharge from the hospital there 5  years ago, and was seen by Dr. Antoine Poche in this office in May 2009 with  complaints  of dizziness and vertigo.  The patient did have some  bradycardia and drop in his blood pressure with these dizzy spells.  At  that point, it was not felt that any further cardiac testing was  necessary.   The patient comes in today and tells me that he has been in his normal  state of good health, but has over the last 7-10 days had episodes of  nausea and vomiting after meals.  He tells me that certain foods cause  him to become nauseous and to abruptly vomit.  He feels dizzy during  these spells, but he has not had any syncopal episodes.  After he  vomits, he tells me that he feels normal.  He denies any chest pain,  change in his baseline dyspnea, palpitations, syncopal episodes,  orthopnea, PND, or lower extremity edema.  He does have a history of a  hiatal hernia, but the records that I have available do not indicate if  there was any workup for this.  He has no other complaints at this time.   PAST MEDICAL HISTORY:  1. Hypertension.  2. Hyperlipidemia.  3. Benign prostatic hypertrophy.  4. Nonobstructive coronary artery disease.  5. Mentioned in the records of idiopathic cardiomyopathy with most      recent assessment of his ejection fraction being normal in May      2005.  6. Diverticulitis.  7. Dyslipidemia.  8. History of aortic insufficiency, although most recent      echocardiogram in May 2005 did not demonstrate any aortic      insufficiency.  9. Dilated aortic root with most recent assessment in 2005.  10.Hiatal hernia.   PAST SURGICAL HISTORY:  1. Left knee surgery.  2. Tonsillectomy.   ALLERGIES:  SULFA and CODEINE.   CURRENT MEDICATIONS:  1. Ramipril 2.5 mg once daily.  2. Finasteride 5 mg once daily.  3. Terazosin 10 mg once daily.  4. Slo-Niacin 500 mg once daily.  5. Loratadine 10 mg once daily.  6. Fish oil 1 g once daily.  7. Aspirin 81 mg once daily.  8. Glucosamine/chondroitin once daily.   SOCIAL HISTORY:  The patient admits to remote tobacco  abuse, but has not  smoked in the last 25 years.  He denies use of alcohol or illicit drugs.  He is married and has 1 child.  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   FAMILY HISTORY:  Noncontributory.   REVIEW OF SYSTEMS:  As stated in the history of present illness is  otherwise negative.   PHYSICAL EXAMINATION:  VITAL SIGNS:  Blood pressure 130/74, pulse 65 and  regular, and respirations 12 and unlabored.  GENERAL:  He is a pleasant elderly Caucasian male in no acute distress.  He is alert and oriented x3.  SKIN:  Warm and dry.  HEENT:  Normal.  PSYCHIATRIC:  Mood and affect are appropriate.  MUSCULOSKELETAL:  Muscle strength and tone is normal.  NEUROLOGICAL:  No focal neurological deficits.  NECK:  No JVD.  No carotid bruits.  No thyromegaly.  No lymphadenopathy.  LUNGS:  Clear to auscultation bilaterally without wheezes, rhonchi, or  crackles noted.  CARDIOVASCULAR:  Regular rate and rhythm with soft systolic murmur noted  at the lower left sternal border and apex.  No diastolic murmurs are  noted.  No gallops or rubs are noted.  ABDOMEN:  Soft, nontender, and nondistended.  Bowel sounds are present.  EXTREMITIES:  No evidence of edema.  Pulses are 2+ in all extremities.   DIAGNOSTIC STUDIES:  A 12-lead EKG obtained in our office today shows  normal sinus rhythm with intraventricular conduction delay.  Corrected  QT interval is 478 milliseconds.   ASSESSMENT AND PLAN:  This is a pleasant 70 year old Caucasian male with  a history of idiopathic cardiomyopathy with most recent assessment of  ejection fraction normal, nonobstructive coronary artery disease without  a assessment in the last 5 years, hypertension, hyperlipidemia, benign  prostatic hypertrophy and hiatal hernia, who presents today for a  routine cardiac followup.  The patient has complaints today that seen  mostly gastrointestinal in nature.  I cannot explain any of his symptoms   has a cardiac source.  His blood pressure is well controlled today and  his heart rate is in an acceptable range.  His EKG shows no changes that  are suggestive of ischemia.  The patient does not describe a history  that is suggestive of arrhythmias, angina, or congestive heart failures.  I would like to perform an echocardiogram, as he has not had an  assessment  of his left ventricular function in the last 5 years.  We  will plan on performing this in the next several weeks and we will alert  the patient of those results.  Otherwise, I would like to see him back  in 6 months.  He is instructed to call us if he has any change in his  clinical status.  The patient is scheduled to follow up with his primary  care physician, Dr. Ermalene Searing, later this week for further evaluation of  his nausea and vomiting.     Verne Carrow, MD  Electronically Signed    CM/MedQ  DD: 04/04/2008  DT: 04/05/2008  Job #: [id number]   cc:   Kerby Nora, MD

## 2010-09-12 ENCOUNTER — Encounter: Payer: Self-pay | Admitting: Cardiovascular Disease

## 2010-09-30 ENCOUNTER — Other Ambulatory Visit: Payer: Self-pay | Admitting: Family Medicine

## 2010-09-30 NOTE — Telephone Encounter (Signed)
Refilled for 3 months , no further refills until pt has an appt.

## 2010-09-30 NOTE — Telephone Encounter (Signed)
Patient not had Physical in 1 year

## 2010-10-02 NOTE — Telephone Encounter (Signed)
Unable to contact patient advised pharmacy to let patient know no further refills until seen for physical

## 2010-10-09 ENCOUNTER — Telehealth: Payer: Self-pay | Admitting: *Deleted

## 2010-10-09 NOTE — Telephone Encounter (Signed)
Patient is going deep sea fishing in a couple of weeks and he gets sea sick. He is asking for motion sickness patches. Uses [facility] [address].

## 2010-10-10 MED ORDER — SCOPOLAMINE 1 MG/3DAYS TD PT72: 1.0000 | MEDICATED_PATCH | TRANSDERMAL | Status: DC

## 2010-10-10 NOTE — Telephone Encounter (Signed)
Patient advised.

## 2010-10-10 NOTE — Telephone Encounter (Signed)
Sent in enough for 30 days... Not sure ho much he needs.

## 2010-10-21 ENCOUNTER — Other Ambulatory Visit: Payer: Self-pay | Admitting: Family Medicine

## 2011-04-02 ENCOUNTER — Ambulatory Visit: Payer: Medicare Other | Admitting: Cardiovascular Disease

## 2011-04-02 ENCOUNTER — Other Ambulatory Visit: Payer: Self-pay | Admitting: *Deleted

## 2011-04-02 MED ORDER — ROSUVASTATIN CALCIUM 10 MG PO TABS: 10.0000 mg | ORAL_TABLET | Freq: Every day | ORAL | Status: DC

## 2011-04-19 ENCOUNTER — Other Ambulatory Visit: Payer: Self-pay | Admitting: Cardiovascular Disease

## 2011-04-22 ENCOUNTER — Telehealth: Payer: Self-pay | Admitting: Family Medicine

## 2011-04-22 DIAGNOSIS — I10 Essential (primary) hypertension: Secondary | ICD-10-CM

## 2011-04-22 DIAGNOSIS — E785 Hyperlipidemia, unspecified: Secondary | ICD-10-CM

## 2011-04-22 DIAGNOSIS — E559 Vitamin D deficiency, unspecified: Secondary | ICD-10-CM

## 2011-04-22 NOTE — Telephone Encounter (Signed)
Message copied by Excell Seltzer on Tue Apr 22, 2011  3:34 PM ------      Message from: Alvina Chou      Created: Tue Apr 22, 2011  3:14 PM      Regarding: Lab orders for 2.6.13       Patient is scheduled for CPX labs, please order future labs, Thanks , Camelia Eng

## 2011-04-30 ENCOUNTER — Other Ambulatory Visit: Payer: Medicare Other

## 2011-05-01 ENCOUNTER — Ambulatory Visit (INDEPENDENT_AMBULATORY_CARE_PROVIDER_SITE_OTHER): Payer: Medicare Other | Admitting: Family Medicine

## 2011-05-01 ENCOUNTER — Encounter: Payer: Self-pay | Admitting: Family Medicine

## 2011-05-01 VITALS — BP 120/76 | HR 58 | Temp 98.5°F | Ht 74.0 in | Wt 217.1 lb

## 2011-05-01 DIAGNOSIS — I428 Other cardiomyopathies: Secondary | ICD-10-CM

## 2011-05-01 DIAGNOSIS — I10 Essential (primary) hypertension: Secondary | ICD-10-CM

## 2011-05-01 DIAGNOSIS — Z125 Encounter for screening for malignant neoplasm of prostate: Secondary | ICD-10-CM | POA: Diagnosis not present

## 2011-05-01 DIAGNOSIS — M5416 Radiculopathy, lumbar region: Secondary | ICD-10-CM

## 2011-05-01 DIAGNOSIS — IMO0002 Reserved for concepts with insufficient information to code with codable children: Secondary | ICD-10-CM | POA: Diagnosis not present

## 2011-05-01 DIAGNOSIS — N400 Enlarged prostate without lower urinary tract symptoms: Secondary | ICD-10-CM

## 2011-05-01 DIAGNOSIS — I495 Sick sinus syndrome: Secondary | ICD-10-CM

## 2011-05-01 DIAGNOSIS — Z0000 Encounter for general adult medical examination without abnormal findings: Secondary | ICD-10-CM | POA: Diagnosis not present

## 2011-05-01 DIAGNOSIS — E559 Vitamin D deficiency, unspecified: Secondary | ICD-10-CM | POA: Diagnosis not present

## 2011-05-01 DIAGNOSIS — E786 Lipoprotein deficiency: Secondary | ICD-10-CM | POA: Diagnosis not present

## 2011-05-01 DIAGNOSIS — E785 Hyperlipidemia, unspecified: Secondary | ICD-10-CM | POA: Diagnosis not present

## 2011-05-01 DIAGNOSIS — N139 Obstructive and reflux uropathy, unspecified: Secondary | ICD-10-CM

## 2011-05-01 DIAGNOSIS — N320 Bladder-neck obstruction: Secondary | ICD-10-CM

## 2011-05-01 DIAGNOSIS — I359 Nonrheumatic aortic valve disorder, unspecified: Secondary | ICD-10-CM

## 2011-05-01 DIAGNOSIS — I2510 Atherosclerotic heart disease of native coronary artery without angina pectoris: Secondary | ICD-10-CM

## 2011-05-01 DIAGNOSIS — Z79899 Other long term (current) drug therapy: Secondary | ICD-10-CM

## 2011-05-01 DIAGNOSIS — R102 Pelvic and perineal pain unspecified side: Secondary | ICD-10-CM

## 2011-05-01 LAB — CBC WITH DIFFERENTIAL/PLATELET
Basophils Relative: 0.7 % (ref 0.0–3.0)
Eosinophils Absolute: 0.3 10*3/uL (ref 0.0–0.7)
HCT: 44.1 % (ref 39.0–52.0)
Hemoglobin: 15.1 g/dL (ref 13.0–17.0)
Lymphs Abs: 1.9 10*3/uL (ref 0.7–4.0)
MCHC: 34.3 g/dL (ref 30.0–36.0)
MCV: 91 fl (ref 78.0–100.0)
Monocytes Absolute: 0.7 10*3/uL (ref 0.1–1.0)
Neutro Abs: 6.3 10*3/uL (ref 1.4–7.7)
Neutrophils Relative %: 67.7 % (ref 43.0–77.0)
RBC: 4.84 Mil/uL (ref 4.22–5.81)

## 2011-05-01 LAB — LIPID PANEL
Cholesterol: 141 mg/dL (ref 0–200)
HDL: 39.6 mg/dL (ref >39.00)
Triglycerides: 79 mg/dL (ref 0.0–149.0)

## 2011-05-01 LAB — BASIC METABOLIC PANEL
CO2: 28 mEq/L (ref 19–32)
Chloride: 103 mEq/L (ref 96–112)
Creatinine, Ser: 1 mg/dL (ref 0.4–1.5)

## 2011-05-01 LAB — HEPATIC FUNCTION PANEL
Albumin: 4.2 g/dL (ref 3.5–5.2)
Total Protein: 7.7 g/dL (ref 6.0–8.3)

## 2011-05-01 MED ORDER — TRAMADOL HCL 50 MG PO TABS: 50.0000 mg | ORAL_TABLET | Freq: Four times a day (QID) | ORAL | Status: AC | PRN

## 2011-05-01 MED ORDER — CYCLOBENZAPRINE HCL 10 MG PO TABS: 10.0000 mg | ORAL_TABLET | Freq: Three times a day (TID) | ORAL | Status: AC | PRN

## 2011-05-01 MED ORDER — SILDENAFIL CITRATE 100 MG PO TABS: 100.0000 mg | ORAL_TABLET | Freq: Every day | ORAL | Status: DC | PRN

## 2011-05-01 NOTE — Progress Notes (Signed)
Patient Name: Dominic Kane Date of Birth: [birth year] Age: 71 y.o. Medical Record Number: [id number] Gender: male Date of Encounter: 05/01/2011  History of Present Illness:  Dominic Kane is a 71 y.o. very pleasant male patient who presents with the following:  Here for medicare wellness exam and f/u of multiple medical problems:  Preventative Health Maintenance Visit:  Health Maintenance Summary Reviewed and updated, unless pt declines services.  Tobacco History Reviewed. Alcohol: No concerns, no excessive use Exercise Habits: Some activity, rec at least 30 mins 5 times a week STD concerns: no risk or activity to increase risk Drug Use: None Encouraged self-testicular check  Health Maintenance  Topic Date Due  . Zostavax  01/07/2001  . Tetanus/tdap  03/25/2011  . Influenza Vaccine  12/23/2011  . Colonoscopy  12/20/2018  . Pneumococcal Polysaccharide Vaccine Age 50 And Over  Addressed  given zostavax script  Labs reviewed with the patient.   Lipids:    Component Value Date/Time   CHOL 141 05/01/2011 1117   TRIG 79.0 05/01/2011 1117   HDL 39.60 05/01/2011 1117   VLDL 15.8 05/01/2011 1117   CHOLHDL 4 05/01/2011 1117    CBC:    Component Value Date/Time   WBC 9.3 05/01/2011 1117   HGB 15.1 05/01/2011 1117   HCT 44.1 05/01/2011 1117   PLT 205.0 05/01/2011 1117   MCV 91.0 05/01/2011 1117   NEUTROABS 6.3 05/01/2011 1117   LYMPHSABS 1.9 05/01/2011 1117   MONOABS 0.7 05/01/2011 1117   EOSABS 0.3 05/01/2011 1117   BASOSABS 0.1 05/01/2011 1117    Basic Metabolic Panel:    Component Value Date/Time   NA 138 05/01/2011 1117   K 4.4 05/01/2011 1117   CL 103 05/01/2011 1117   CO2 28 05/01/2011 1117   BUN 20 05/01/2011 1117   CREATININE 1.0 05/01/2011 1117   GLUCOSE 104* 05/01/2011 1117   CALCIUM 9.9 05/01/2011 1117    Lab Results  Component Value Date   ALT 18 05/01/2011   AST 22 05/01/2011   ALKPHOS 90 05/01/2011   BILITOT 1.5* 05/01/2011    Lab Results  Component Value Date   PSA 0.27 05/01/2011   PSA  0.32 04/26/2009   PSA 0.29 12/06/2007     HTN: Tolerating all medications without side effects Stable and at goal No CP, no sob. No HA.  BP Readings from Last 3 Encounters:  05/01/11 120/76  07/04/10 120/72  03/28/10 104/70   Lipids: Doing well, stable. Tolerating meds fine with no SE. Panel reviewed with patient.  Cardiomyopathy. The patient is on an ACE inhibitor. He is also on a statin. He is also taking aspirin. No pulmonary congestion. No significant edema. No shortness of breath.  Intermittent sciatica. This will flare up and bothering him when he is not active. He does still work. When he is not working, sciatica will flare up. He is doing well when he is more active in the stretches region now. He does intermittently use of Flexeril and tramadol when it will flare up.  Rare headache.  More in fall and winter.   Patient Active Problem List  Diagnoses  . UNSPECIFIED VITAMIN D DEFICIENCY  . HYPERLIPIDEMIA-MIXED  . LOW HDL  . EUSTACHIAN TUBE DYSFUNCTION, BILATERAL  . BENIGN POSITIONAL VERTIGO  . HYPERTENSION  . AORTIC INSUFFICIENCY  . CARDIOMYOPATHY, PRIMARY  . SINUS BRADYCARDIA  . THORACIC AORTIC ANEURYSM  . BENIGN PROSTATIC HYPERTROPHY  . SKIN LESIONS, MULTIPLE  . BACK PAIN, LUMBAR, WITH RADICULOPATHY  . GAIT IMBALANCE  .  DIVERTICULITIS, HX OF  . CAD, NATIVE VESSEL   Past Medical History  Diagnosis Date  . Coronary atherosclerosis of native coronary artery   . Acute sinusitis, unspecified   . 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 Unspecified vitamin D deficiency   . Dysfunction of eustachian tube   . Abnormality of gait   . Aortic valve disorders   . Sinoatrial node dysfunction   . Lipoprotein deficiencies   . Other primary cardiomyopathies   . Unspecified essential hypertension   . Other and unspecified hyperlipidemia   . Displacement of lumbar intervertebral disc without myelopathy   . Unspecified disorder of skin and subcutaneous tissue   .  Benign paroxysmal positional vertigo   . Headache   . Hypertrophy of prostate without urinary obstruction and other lower urinary tract symptoms (LUTS)   . Pain in joint, lower leg   . Pes anserinus tendinitis or bursitis   . Thoracic or lumbosacral neuritis or radiculitis, unspecified   . Personal history of other diseases of digestive system   . Personal history of colonic polyps    Past Surgical History  Procedure Date  . Knee surgery 1947  . Esophagogastroduodenoscopy 11/07    Schatzki's ring, non bleeding erosive gastropathy [place]  . Dobutamine stress echo 1/07    Lateral hypokinesis but no ischemia  . Pulmonary functioning tests 1. 2003  2. 2005    1. Diminished lung capacity  2. Stable   History  Substance Use Topics  . Smoking status: Former Smoker    Quit date: 07/03/1980  . Smokeless tobacco: Not on file  . Alcohol Use: Yes   Family History  Problem Relation Age of Onset  . Heart failure Mother   . Coronary artery disease Mother   . Stroke Mother   . Cancer Father     LUNG  . Cancer Sister     BREAST  . Heart failure Maternal Grandmother    Allergies  Allergen Reactions  . Codeine     REACTION: nausea  . Sulfonamide Derivatives     REACTION: hives   Current Outpatient Prescriptions on File Prior to Visit  Medication Sig Dispense Refill  . aspirin 81 MG tablet Take 81 mg by mouth daily.        . finasteride (PROSCAR) 5 MG tablet TAKE ONE TABLET BY MOUTH EVERY DAY  90 tablet  0  . fluticasone (FLONASE) 50 MCG/ACT nasal spray USE TWO SPRAY IN EACH NOSTRIL EVERY DAY  16 g  2  . glucosamine-chondroitin 500-400 MG tablet Take 1 tablet by mouth 2 (two) times daily.        [name] loratadine-pseudoephedrine (CLARITIN-D 24-HOUR) 10-240 MG per 24 hr tablet Take 1 tablet by mouth daily.        . Omega-3 Fatty Acids (FISH OIL) 1200 MG CAPS Take 2 capsules by mouth daily.        . ramipril (ALTACE) 2.5 MG tablet Take 2.5 mg by mouth daily.        . rosuvastatin  (CRESTOR) 10 MG tablet Take 1 tablet (10 mg total) by mouth daily.  90 tablet  2  . terazosin (HYTRIN) 10 MG capsule TAKE ONE CAPSULE BY MOUTH EVERY DAY  90 capsule  3     Past Medical History, Surgical History, Social History, Family History, Problem List, Medications, and Allergies have been reviewed and updated if relevant.  Review of Systems:  General: Denies fever, chills, sweats. No significant weight loss. Eyes: Denies  blurring,significant itching ENT: Denies earache, sore throat, and hoarseness. Cardiovascular: Denies chest pains, palpitations, dyspnea on exertion Respiratory: Denies cough, dyspnea at rest,wheeezing Breast: no concerns about lumps GI: Denies nausea, vomiting, diarrhea, constipation, change in bowel habits, abdominal pain, melena, hematochezia GU: Denies penile discharge, ED, urinary flow / outflow problems. No STD concerns. Musculoskeletal: ABOVE Derm: Denies rash, itching Neuro: Denies  paresthesias, frequent falls, frequent headaches Psych: Denies depression, anxiety Endocrine: Denies cold intolerance, heat intolerance, polydipsia Heme: Denies enlarged lymph nodes Allergy: No hayfever   Physical Examination: Filed Vitals:   05/01/11 0954  BP: 120/76  Pulse: 58  Temp: 98.5 F (36.9 C)  TempSrc: Oral  Height: 6\' 2"  (1.88 m)  Weight: 217 lb 1.9 oz (98.485 kg)  SpO2: 98%    Body mass index is 27.88 kg/(m^2).   Wt Readings from Last 3 Encounters:  05/01/11 217 lb 1.9 oz (98.485 kg)  07/04/10 208 lb 12.8 oz (94.711 kg)  03/28/10 200 lb 8 oz (90.946 kg)    GEN: well developed, well nourished, no acute distress Eyes: conjunctiva and lids normal, PERRLA, EOMI ENT: TM clear, nares clear, oral exam WNL Neck: supple, no lymphadenopathy, no thyromegaly, no JVD Pulm: clear to auscultation and percussion, respiratory effort normal CV: regular rate and rhythm, S1-S2, 1-2/6 SEM, rub or gallop, no bruits, peripheral pulses normal and symmetric, no  cyanosis, clubbing, edema or varicosities Chest: no scars, masses GI: soft, non-tender; no hepatosplenomegaly, masses; active bowel sounds all quadrants GU: no hernia, testicular mass, penile discharge, or prostate enlargement Lymph: no cervical, axillary or inguinal adenopathy MSK: gait normal, muscle tone and strength WNL, no joint swelling, effusions, discoloration, crepitus  SKIN: clear, good turgor, color WNL, no rashes, lesions, or ulcerations Neuro: normal mental status, normal strength, sensation, and motion Psych: alert; oriented to person, place and time, normally interactive and not anxious or depressed in appearance.  Assessment and Plan: 1. Routine general medical examination at a health care facility    2. CAD, NATIVE VESSEL    3. SINUS BRADYCARDIA    4. CARDIOMYOPATHY, PRIMARY    5. AORTIC INSUFFICIENCY    6. LOW HDL  Lipid panel  7. HYPERLIPIDEMIA-MIXED  Lipid panel  8. Unspecified vitamin D deficiency  Vitamin D 25 hydroxy  9. HYPERTENSION    10. Lumbar radiculopathy  traMADol (ULTRAM) 50 MG tablet, cyclobenzaprine (FLEXERIL) 10 MG tablet  11. BPH (benign prostatic hyperplasia)  PSA  12. Encounter for long-term (current) use of other medications  Basic metabolic panel, CBC with Differential, Hepatic function panel  13. Special screening for malignant neoplasm of prostate  PSA  14. Pelvic pain  PSA  15. Bladder outflow obstruction  PSA  16. BENIGN PROSTATIC HYPERTROPHY      I have personally reviewed the Medicare Annual Wellness questionnaire and have noted 1. The patient's medical and social history 2. Their use of alcohol, tobacco or illicit drugs 3. Their current medications and supplements 4. The patient's functional ability including ADL's, fall risks, home safety risks and hearing or visual             impairment. 5. Diet and physical activities 6. Evidence for depression or mood disorders  The patients weight, height, BMI and visual acuity have been  recorded in the chart I have made referrals, counseling and provided education to the patient based review of the above and I have provided the pt with a written personalized care plan for preventive services.  I have provided the patient with a  copy of your personalized plan for preventive services. Instructed to take the time to review along with their updated medication list.   From a coronary disease as well as his cardiomyopathy and aortic insufficiency. The patient is stable and has no complaints. He does follow up and see cardiology routinely.  Hyperlipidemia, we'll check a lipid panel today. Stable current medications.  Hypertension: Stable.  Sciatica and lumbar radiculopathy. Reviewed  Some basic rehabilitation and stretching would be appropriate and a good idea. Refill tramadol and Flexeril.  BPH with some mild outflow obstruction. Check psa, cont hytrin and proscar

## 2011-05-02 ENCOUNTER — Encounter: Payer: Medicare Other | Admitting: Family Medicine

## 2011-05-02 DIAGNOSIS — L909 Atrophic disorder of skin, unspecified: Secondary | ICD-10-CM | POA: Diagnosis not present

## 2011-05-02 DIAGNOSIS — L919 Hypertrophic disorder of the skin, unspecified: Secondary | ICD-10-CM | POA: Diagnosis not present

## 2011-05-02 DIAGNOSIS — L570 Actinic keratosis: Secondary | ICD-10-CM | POA: Diagnosis not present

## 2011-05-02 DIAGNOSIS — L821 Other seborrheic keratosis: Secondary | ICD-10-CM | POA: Diagnosis not present

## 2011-05-05 ENCOUNTER — Encounter: Payer: Medicare Other | Admitting: Family Medicine

## 2011-06-05 ENCOUNTER — Ambulatory Visit (INDEPENDENT_AMBULATORY_CARE_PROVIDER_SITE_OTHER): Payer: Medicare Other | Admitting: Family Medicine

## 2011-06-05 ENCOUNTER — Encounter: Payer: Self-pay | Admitting: Family Medicine

## 2011-06-05 VITALS — BP 118/62 | HR 73 | Temp 100.2°F | Wt 217.8 lb

## 2011-06-05 DIAGNOSIS — B349 Viral infection, unspecified: Secondary | ICD-10-CM

## 2011-06-05 DIAGNOSIS — B9789 Other viral agents as the cause of diseases classified elsewhere: Secondary | ICD-10-CM | POA: Diagnosis not present

## 2011-06-05 MED ORDER — BENZONATATE 200 MG PO CAPS: 200.0000 mg | ORAL_CAPSULE | Freq: Three times a day (TID) | ORAL | Status: AC | PRN

## 2011-06-05 NOTE — Progress Notes (Signed)
duration of symptoms: ~1 week Rhinorrhea: yes, discolored congestion:yes ear pain: no sore throat: yes, worse at night.   Cough: yes, since last night, now chest is sore with certain R arm movements.  myalgias:yes other concerns:fatigue and facial pain  Works with [employer]  ROS: See HPI.  Otherwise negative.    Meds, vitals, and allergies reviewed.   GEN: nad, alert and oriented HEENT: mucous membranes moist, TM w/o erythema, nasal epithelium injected, OP with cobblestoning and typical viral appearing changes in the posterior OP NECK: supple w/o LA CV: rrr. PULM: ctab, no inc wob ABD: soft, +bs EXT: no edema Chest wall ttp on R anterior side, more pain with deep breath but improved with external compression.

## 2011-06-05 NOTE — Patient Instructions (Signed)
Hug a pillow when you cough.  Use the tessalon for the cough, too.  Gargle with salt water for your throat and use the nasal spray.  This should gradually get better.

## 2011-06-06 DIAGNOSIS — B349 Viral infection, unspecified: Secondary | ICD-10-CM | POA: Insufficient documentation

## 2011-06-06 NOTE — Assessment & Plan Note (Signed)
With diffuse changes suggestive of viral process.  Chest wall irritation likely from cough.  Supportive tx. ddx d/w pt and abx likely not of use.  F/u prn.  He agrees. Nontoxic.

## 2011-06-16 ENCOUNTER — Ambulatory Visit (INDEPENDENT_AMBULATORY_CARE_PROVIDER_SITE_OTHER): Payer: Medicare Other | Admitting: Ophthalmology

## 2011-06-16 DIAGNOSIS — H40019 Open angle with borderline findings, low risk, unspecified eye: Secondary | ICD-10-CM

## 2011-06-16 DIAGNOSIS — H2510 Age-related nuclear cataract, unspecified eye: Secondary | ICD-10-CM

## 2011-06-16 DIAGNOSIS — H43819 Vitreous degeneration, unspecified eye: Secondary | ICD-10-CM

## 2011-06-16 DIAGNOSIS — H35039 Hypertensive retinopathy, unspecified eye: Secondary | ICD-10-CM

## 2011-06-16 DIAGNOSIS — I10 Essential (primary) hypertension: Secondary | ICD-10-CM | POA: Diagnosis not present

## 2011-06-16 DIAGNOSIS — H35379 Puckering of macula, unspecified eye: Secondary | ICD-10-CM

## 2011-06-18 ENCOUNTER — Encounter (INDEPENDENT_AMBULATORY_CARE_PROVIDER_SITE_OTHER): Payer: Self-pay | Admitting: Ophthalmology

## 2011-07-10 DIAGNOSIS — H2510 Age-related nuclear cataract, unspecified eye: Secondary | ICD-10-CM | POA: Diagnosis not present

## 2011-07-10 DIAGNOSIS — H40019 Open angle with borderline findings, low risk, unspecified eye: Secondary | ICD-10-CM | POA: Diagnosis not present

## 2011-07-10 DIAGNOSIS — H35039 Hypertensive retinopathy, unspecified eye: Secondary | ICD-10-CM | POA: Diagnosis not present

## 2011-07-10 DIAGNOSIS — H3530 Unspecified macular degeneration: Secondary | ICD-10-CM | POA: Diagnosis not present

## 2011-07-19 ENCOUNTER — Other Ambulatory Visit: Payer: Self-pay | Admitting: Cardiovascular Disease

## 2011-07-21 NOTE — Telephone Encounter (Signed)
Refilled ramipril and proscar

## 2011-07-24 ENCOUNTER — Other Ambulatory Visit: Payer: Self-pay

## 2011-07-24 NOTE — Telephone Encounter (Signed)
Pt said [facility] [address] does not have refills for Crestor, terazosin, finasteride, and ramipril. I spoke with Dois Davenport at [facility] [address] and pt has refills on all meds. Pt notified while on phone.

## 2011-07-25 ENCOUNTER — Encounter: Payer: Medicare Other | Admitting: Family Medicine

## 2011-09-02 ENCOUNTER — Other Ambulatory Visit: Payer: Self-pay | Admitting: Cardiovascular Disease

## 2011-10-13 ENCOUNTER — Other Ambulatory Visit: Payer: Self-pay | Admitting: *Deleted

## 2011-10-13 MED ORDER — TERAZOSIN HCL 10 MG PO CAPS: 10.0000 mg | ORAL_CAPSULE | Freq: Every day | ORAL | Status: DC

## 2011-10-13 NOTE — Telephone Encounter (Signed)
Received faxed refill request from pharmacy. Refill sent to pharmacy electronically. 

## 2011-10-16 ENCOUNTER — Other Ambulatory Visit: Payer: Self-pay | Admitting: Cardiovascular Disease

## 2011-10-17 MED ORDER — FINASTERIDE 5 MG PO TABS: 5.0000 mg | ORAL_TABLET | Freq: Every day | ORAL | Status: DC

## 2011-11-04 ENCOUNTER — Other Ambulatory Visit: Payer: Self-pay | Admitting: *Deleted

## 2011-12-05 ENCOUNTER — Ambulatory Visit (INDEPENDENT_AMBULATORY_CARE_PROVIDER_SITE_OTHER): Payer: Medicare Other | Admitting: Cardiovascular Disease

## 2011-12-05 ENCOUNTER — Encounter: Payer: Self-pay | Admitting: Cardiovascular Disease

## 2011-12-05 VITALS — BP 128/70 | HR 72 | Ht 74.0 in | Wt 215.0 lb

## 2011-12-05 DIAGNOSIS — I428 Other cardiomyopathies: Secondary | ICD-10-CM | POA: Diagnosis not present

## 2011-12-05 DIAGNOSIS — I712 Thoracic aortic aneurysm, without rupture, unspecified: Secondary | ICD-10-CM | POA: Diagnosis not present

## 2011-12-05 DIAGNOSIS — I25810 Atherosclerosis of coronary artery bypass graft(s) without angina pectoris: Secondary | ICD-10-CM | POA: Diagnosis not present

## 2011-12-05 DIAGNOSIS — E785 Hyperlipidemia, unspecified: Secondary | ICD-10-CM | POA: Diagnosis not present

## 2011-12-05 DIAGNOSIS — R079 Chest pain, unspecified: Secondary | ICD-10-CM

## 2011-12-05 NOTE — Progress Notes (Signed)
History of Present Illness: [name] is a pleasant 71 year-old Caucasian male with a past medical history significant for hypertension, nonobstructive coronary artery disease, idiopathic cardiomyopathy, benign prostatic hypertrophy, hyperlipidemia and hiatal hernia, who presents today for routine cardiac followup. [name] had an episode of chest pain about 7 years ago; at which time, he was evaluated by the [facility] in [place], [place]. He was apparently known to have an ejection fraction of 35-40%, which was felt to be idiopathic. Left heart catheterization was performed in May 2005 and showed diffuse 15% irregularities in the LAD, diffuse 20% irregularities in the ramus intermedius branch, diffuse 30% irregularities in the nondominant circumflex, and a 50% stenosis in the proximal RCA with irregularities in the midportion of the vessel. The ascending aorta was mildly dilated with no evidence of dissection during that catheterization. Ejection fraction was noted to be 65%. An echocardiogram during that admission in May 2005 in [place] showed an ejection fraction of 55-60% with the ascending aorta at the sinotubular junction measuring 4.1 cm. We got an echo in February 2010 which showed mild systolic and diastolic dysfunction with LVEF of 45%. There was mild to moderate aortic stenosis. Follow up echo on January 13,2011 with mild global LV dysfunction, EF 45%, no significant aortic stenosis, mild aortic regurgitation and mildly dilated aortic root (stable in size). Cardiac MRI December 2011 showed mild enlargement of the aortic root and EF of 48%.   He is here today for follow up. He describes exertional dyspnea and mild chest pressure. This has been present for several months. No worsening. No awareness of palpitations, near syncope, syncope, orthopnea, PND, LE edema. He is very active.   Primary Care Physician: Kerby Nora  Last Lipid Profile:Lipid Panel     Component Value  Date/Time   CHOL 141 05/01/2011 1117   TRIG 79.0 05/01/2011 1117   HDL 39.60 05/01/2011 1117   CHOLHDL 4 05/01/2011 1117   VLDL 15.8 05/01/2011 1117   LDLCALC 86 05/01/2011 1117     Past Medical History  Diagnosis Date  . Coronary atherosclerosis of native coronary artery   . Acute sinusitis, unspecified   . 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 Unspecified vitamin D deficiency   . Dysfunction of eustachian tube   . Abnormality of gait   . Aortic valve disorders   . Sinoatrial node dysfunction   . Lipoprotein deficiencies   . Other primary cardiomyopathies   . Unspecified essential hypertension   . Other and unspecified hyperlipidemia   . Displacement of lumbar intervertebral disc without myelopathy   . Unspecified disorder of skin and subcutaneous tissue   . Benign paroxysmal positional vertigo   . Headache   . Hypertrophy of prostate without urinary obstruction and other lower urinary tract symptoms (LUTS)   . Pain in joint, lower leg   . Pes anserinus tendinitis or bursitis   . Thoracic or lumbosacral neuritis or radiculitis, unspecified   . Personal history of other diseases of digestive system   . Personal history of colonic polyps     Past Surgical History  Procedure Date  . Knee surgery 1947  . Esophagogastroduodenoscopy 11/07    Schatzki's ring, non bleeding erosive gastropathy [name]  . Dobutamine stress echo 1/07    Lateral hypokinesis but no ischemia  . Pulmonary functioning tests 1. 2003  2. 2005    1. Diminished lung capacity  2. Stable    Current Outpatient Prescriptions  Medication Sig Dispense  Refill  . aspirin 81 MG tablet Take 81 mg by mouth daily.        . finasteride (PROSCAR) 5 MG tablet Take 1 tablet (5 mg total) by mouth daily.  30 tablet  0  . Multiple Vitamins-Minerals (VISION-VITE PRESERVE PO) Take 1 tablet by mouth daily.      . Pseudoephedrine-Acetaminophen (ALLEREST NO DROWSINESS PO) Take by mouth daily.      . ramipril (ALTACE)  2.5 MG capsule TAKE ONE CAPSULE BY MOUTH EVERY DAY  30 capsule  6  . rosuvastatin (CRESTOR) 10 MG tablet Take 1 tablet (10 mg total) by mouth daily.  90 tablet  2  . terazosin (HYTRIN) 10 MG capsule Take 1 capsule (10 mg total) by mouth daily.  90 capsule  1  . sildenafil (VIAGRA) 100 MG tablet Take 1 tablet (100 mg total) by mouth daily as needed for erectile dysfunction.  5 tablet  prn  . DISCONTD: ramipril (ALTACE) 2.5 MG tablet Take 2.5 mg by mouth daily.          Allergies  Allergen Reactions  . Codeine     REACTION: nausea  . Sulfonamide Derivatives     REACTION: hives    History   Social History  . Marital Status: Married    Spouse Name: N/A    Number of Children: 1  . Years of Education: N/A   Occupational History  . retired Museum/gallery curator    Social History Main Topics  . Smoking status: Former Smoker    Quit date: 07/03/1980  . Smokeless tobacco: Not on file  . Alcohol Use: Yes  . Drug Use: No  . Sexually Active: Not on file   Other Topics Concern  . Not on file   Social History Narrative   REGULAR EXERCISE-Yes, bicycle 4 times per weekDiet: fruit and veggies, 40lb weight loss in 2 years    Family History  Problem Relation Age of Onset  . Heart failure Mother   . Coronary artery disease Mother   . Stroke Mother   . Cancer Father     LUNG  . Cancer Sister     BREAST  . Heart failure Maternal Grandmother     Review of Systems:  As stated in the HPI and otherwise negative.   BP 128/70  Pulse 72  Ht 6\' 2"  (1.88 m)  Wt 215 lb (97.523 kg)  BMI 27.60 kg/m2  Physical Examination: General: Well developed, well nourished, NAD HEENT: OP clear, mucus membranes moist SKIN: warm, dry. No rashes. Neuro: No focal deficits Musculoskeletal: Muscle strength 5/5 all ext Psychiatric: Mood and affect normal Neck: No JVD, no carotid bruits, no thyromegaly, no lymphadenopathy. Lungs:Clear bilaterally, no wheezes, rhonci, crackles Cardiovascular: Regular rate and  rhythm. No murmurs, gallops or rubs. Abdomen:Soft. Bowel sounds present. Non-tender.  Extremities: No lower extremity edema. Pulses are 2 + in the bilateral DP/PT.  EKG: NSR, rate 72 bpm. LAFB. LVH.   Assessment and Plan:   1. CAD: Recent chest pressure. He is known to have CAD. Will arrange exercise stress myoview to exclude ischemia. Continue ASA and statin.   2. Non-ischemic Cardiomyopathy: LVEF 48% by cardiac MRI 12/11. Will repeat echo to assess LVEF, exclude new structural abnormalities.   3. Thoracic aortic aneurysm:  Last imaging 12/11. We had planned repeat echo earlier this year but this was not done. Will arrange f/u echo today.    4. HLD: Continue statin.

## 2011-12-05 NOTE — Patient Instructions (Addendum)
Your physician wants you to follow-up in:  12 months. You will receive a reminder letter in the mail two months in advance. If you don't receive a letter, please call our office to schedule the follow-up appointment.  Your physician has requested that you have an exercise stress myoview. For further information please visit [email or web address] Please follow instruction sheet, as given.  Your physician has requested that you have an echocardiogram. Echocardiography is a painless test that uses sound waves to create images of your heart. It provides your doctor with information about the size and shape of your heart and how well your heart's chambers and valves are working. This procedure takes approximately one hour. There are no restrictions for this procedure.

## 2011-12-10 ENCOUNTER — Ambulatory Visit (HOSPITAL_COMMUNITY): Payer: Medicare Other | Attending: Cardiovascular Disease

## 2011-12-10 DIAGNOSIS — I2510 Atherosclerotic heart disease of native coronary artery without angina pectoris: Secondary | ICD-10-CM | POA: Diagnosis not present

## 2011-12-10 DIAGNOSIS — I080 Rheumatic disorders of both mitral and aortic valves: Secondary | ICD-10-CM | POA: Insufficient documentation

## 2011-12-10 DIAGNOSIS — I369 Nonrheumatic tricuspid valve disorder, unspecified: Secondary | ICD-10-CM | POA: Insufficient documentation

## 2011-12-10 DIAGNOSIS — I712 Thoracic aortic aneurysm, without rupture, unspecified: Secondary | ICD-10-CM

## 2011-12-10 DIAGNOSIS — I2589 Other forms of chronic ischemic heart disease: Secondary | ICD-10-CM | POA: Insufficient documentation

## 2011-12-10 DIAGNOSIS — I428 Other cardiomyopathies: Secondary | ICD-10-CM

## 2011-12-10 DIAGNOSIS — I10 Essential (primary) hypertension: Secondary | ICD-10-CM | POA: Diagnosis not present

## 2011-12-10 DIAGNOSIS — I379 Nonrheumatic pulmonary valve disorder, unspecified: Secondary | ICD-10-CM | POA: Insufficient documentation

## 2011-12-10 NOTE — Progress Notes (Signed)
Echocardiogram performed.  

## 2011-12-15 ENCOUNTER — Ambulatory Visit (HOSPITAL_COMMUNITY): Payer: Medicare Other | Admitting: Radiology

## 2011-12-15 ENCOUNTER — Telehealth (HOSPITAL_COMMUNITY): Payer: Self-pay

## 2011-12-15 ENCOUNTER — Encounter: Payer: Self-pay | Admitting: Family Medicine

## 2011-12-15 ENCOUNTER — Ambulatory Visit (INDEPENDENT_AMBULATORY_CARE_PROVIDER_SITE_OTHER): Payer: Medicare Other | Admitting: Family Medicine

## 2011-12-15 VITALS — BP 104/64 | HR 80 | Temp 99.3°F | Wt 219.0 lb

## 2011-12-15 DIAGNOSIS — J0100 Acute maxillary sinusitis, unspecified: Secondary | ICD-10-CM | POA: Diagnosis not present

## 2011-12-15 DIAGNOSIS — J069 Acute upper respiratory infection, unspecified: Secondary | ICD-10-CM

## 2011-12-15 LAB — POCT INFLUENZA A/B
Influenza A, POC: NEGATIVE
Influenza B, POC: NEGATIVE

## 2011-12-15 MED ORDER — AMOXICILLIN-POT CLAVULANATE 875-125 MG PO TABS: 1.0000 | ORAL_TABLET | Freq: Two times a day (BID) | ORAL | Status: DC

## 2011-12-15 NOTE — Telephone Encounter (Signed)
Dominic Kane (DOB 1940-06-02) here for a Cardiolite today. The patient is complaining of fever 100 yesterday. He has a cough with yellow-green phlegm,dizziness, and chills. The patient's temperature here is 98.9. The  Cardiolite was  cancelled today. I  called Dr Daphine Deutscher office and was given an appointment for the patient at 10:15 this am with Dr. Dallas Schimke.Irean Hong, RN.

## 2011-12-15 NOTE — Progress Notes (Signed)
[facility] [address] [place] [address] Phone: [phone] Fax: [phone]  Date:  12/15/2011   Name:  Dominic Kane   DOB:  [birth year]   MRN:  [id number] Gender: male Age: 71 y.o.  PCP:  Kerby Nora, MD    Chief Complaint: Chills, fever up to 100 yesterday, feels lousy and Yellow green nasal drainage, sore throat, cough   History of Present Illness:  Dominic Kane is a 71 y.o. pleasant patient who presents with the following:  Sat, HA, fever,sore throat and light chills. Will be eating ok in general.  Having a lot of muscle aches and joint aches.   He did have a fever greater than 100 on Saturday, and he is been having a lot of facial pain and headaches. He has had some ear fullness. He also complains of polyarthralgias and myalgias.  Patient Active Problem List  Diagnosis  . UNSPECIFIED VITAMIN D DEFICIENCY  . HYPERLIPIDEMIA-MIXED  . LOW HDL  . EUSTACHIAN TUBE DYSFUNCTION, BILATERAL  . BENIGN POSITIONAL VERTIGO  . HYPERTENSION  . AORTIC INSUFFICIENCY  . CARDIOMYOPATHY, PRIMARY  . SINUS BRADYCARDIA  . THORACIC AORTIC ANEURYSM  . BENIGN PROSTATIC HYPERTROPHY  . SKIN LESIONS, MULTIPLE  . BACK PAIN, LUMBAR, WITH RADICULOPATHY  . GAIT IMBALANCE  . DIVERTICULITIS, HX OF  . CAD, NATIVE VESSEL  . Viral syndrome    Past Medical History  Diagnosis Date  . Coronary atherosclerosis of native coronary artery   . Acute sinusitis, unspecified   . 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 Unspecified vitamin D deficiency   . Dysfunction of eustachian tube   . Abnormality of gait   . Aortic valve disorders   . Sinoatrial node dysfunction   . Lipoprotein deficiencies   . Other primary cardiomyopathies   . Unspecified essential hypertension   . Other and unspecified hyperlipidemia   . Displacement of lumbar intervertebral disc without myelopathy   . Unspecified disorder of skin and subcutaneous tissue   . Benign  paroxysmal positional vertigo   . Headache   . Hypertrophy of prostate without urinary obstruction and other lower urinary tract symptoms (LUTS)   . Pain in joint, lower leg   . Pes anserinus tendinitis or bursitis   . Thoracic or lumbosacral neuritis or radiculitis, unspecified   . Personal history of other diseases of digestive system   . Personal history of colonic polyps     Past Surgical History  Procedure Date  . Knee surgery 1947  . Esophagogastroduodenoscopy 11/07    Schatzki's ring, non bleeding erosive gastropathy Dominic Kane  . Dobutamine stress echo 1/07    Lateral hypokinesis but no ischemia  . Pulmonary functioning tests 1. 2003  2. 2005    1. Diminished lung capacity  2. Stable    History  Substance Use Topics  . Smoking status: Former Smoker    Quit date: 07/03/1980  . Smokeless tobacco: Not on file  . Alcohol Use: Yes    Family History  Problem Relation Age of Onset  . Heart failure Mother   . Coronary artery disease Mother   . Stroke Mother   . Cancer Father     LUNG  . Cancer Sister     BREAST  . Heart failure Maternal Grandmother     Allergies  Allergen Reactions  . Codeine     REACTION: nausea  . Sulfonamide Derivatives     REACTION: hives    Medication  list has been reviewed and updated.  Outpatient Prescriptions Prior to Visit  Medication Sig Dispense Refill  . aspirin 81 MG tablet Take 81 mg by mouth daily.        . Multiple Vitamins-Minerals (VISION-VITE PRESERVE PO) Take 1 tablet by mouth daily.      . Pseudoephedrine-Acetaminophen (ALLEREST NO DROWSINESS PO) Take by mouth daily.      . ramipril (ALTACE) 2.5 MG capsule TAKE ONE CAPSULE BY MOUTH EVERY DAY  30 capsule  6  . rosuvastatin (CRESTOR) 10 MG tablet Take 1 tablet (10 mg total) by mouth daily.  90 tablet  2  . terazosin (HYTRIN) 10 MG capsule Take 1 capsule (10 mg total) by mouth daily.  90 capsule  1  . finasteride (PROSCAR) 5 MG tablet Take 1 tablet (5 mg total) by mouth  daily.  30 tablet  0  . sildenafil (VIAGRA) 100 MG tablet Take 1 tablet (100 mg total) by mouth daily as needed for erectile dysfunction.  5 tablet  prn    Review of Systems: ROS: GEN: Acute illness details above GI: Tolerating PO intake GU: maintaining adequate hydration and urination Pulm: No SOB Interactive and getting along well at home.  Otherwise, ROS is as per the HPI.   Physical Examination: Filed Vitals:   12/15/11 1011  BP: 104/64  Pulse: 80  Temp: 99.3 F (37.4 C)   Filed Vitals:   12/15/11 1011  Weight: 219 lb (99.338 kg)   There is no height on file to calculate BMI. Ideal Body Weight:     Gen: WDWN, NAD; alert,appropriate and cooperative throughout exam  HEENT: Normocephalic and atraumatic. Throat clear, w/o exudate, no LAD, R TM clear, L TM - good landmarks, No fluid present. rhinnorhea.  Left frontal and maxillary sinuses: Tender, max Right frontal and maxillary sinuses: Tender, max  Neck: No ant or post LAD CV: RRR, No M/G/R Pulm: Breathing comfortably in no resp distress. no w/c/r Abd: S,NT,ND,+BS Extr: no c/c/e Psych: full affect, pleasant   Assessment and Plan:  1. Sinusitis, acute maxillary    2. URI (upper respiratory infection)  Influenza A/B   Probable sinusitis, treated with antibiotics. Augmentin  Results for orders placed in visit on 12/15/11  POCT INFLUENZA A/B      Component Value Range   Influenza A, POC Negative     Influenza B, POC Negative       Orders Today:  Orders Placed This Encounter  Procedures  . Influenza A/B    Updated Medication List: (Includes new medications, updates to list, dose adjustments) Outpatient Encounter Prescriptions as of 12/15/2011  Medication Sig Dispense Refill  . aspirin 81 MG tablet Take 81 mg by mouth daily.        . Multiple Vitamins-Minerals (VISION-VITE PRESERVE PO) Take 1 tablet by mouth daily.      . Pseudoephedrine-Acetaminophen (ALLEREST NO DROWSINESS PO) Take by mouth daily.        . ramipril (ALTACE) 2.5 MG capsule TAKE ONE CAPSULE BY MOUTH EVERY DAY  30 capsule  6  . rosuvastatin (CRESTOR) 10 MG tablet Take 1 tablet (10 mg total) by mouth daily.  90 tablet  2  . terazosin (HYTRIN) 10 MG capsule Take 1 capsule (10 mg total) by mouth daily.  90 capsule  1  . amoxicillin-clavulanate (AUGMENTIN) 875-125 MG per tablet Take 1 tablet by mouth 2 (two) times daily.  20 tablet  0  . finasteride (PROSCAR) 5 MG tablet Take 1 tablet (5 mg  total) by mouth daily.  30 tablet  0  . DISCONTD: sildenafil (VIAGRA) 100 MG tablet Take 1 tablet (100 mg total) by mouth daily as needed for erectile dysfunction.  5 tablet  prn    Medications Discontinued: Medications Discontinued During This Encounter  Medication Reason  . sildenafil (VIAGRA) 100 MG tablet Error     Hannah Beat, MD

## 2011-12-29 ENCOUNTER — Ambulatory Visit (HOSPITAL_COMMUNITY): Payer: Medicare Other | Attending: Cardiology | Admitting: Radiology

## 2011-12-29 VITALS — BP 117/76 | Ht 74.0 in | Wt 211.0 lb

## 2011-12-29 DIAGNOSIS — I4949 Other premature depolarization: Secondary | ICD-10-CM

## 2011-12-29 DIAGNOSIS — R079 Chest pain, unspecified: Secondary | ICD-10-CM | POA: Diagnosis not present

## 2011-12-29 DIAGNOSIS — I2510 Atherosclerotic heart disease of native coronary artery without angina pectoris: Secondary | ICD-10-CM | POA: Diagnosis not present

## 2011-12-29 DIAGNOSIS — R0609 Other forms of dyspnea: Secondary | ICD-10-CM | POA: Diagnosis not present

## 2011-12-29 DIAGNOSIS — I10 Essential (primary) hypertension: Secondary | ICD-10-CM | POA: Insufficient documentation

## 2011-12-29 DIAGNOSIS — R0602 Shortness of breath: Secondary | ICD-10-CM

## 2011-12-29 DIAGNOSIS — R0789 Other chest pain: Secondary | ICD-10-CM | POA: Diagnosis not present

## 2011-12-29 DIAGNOSIS — R42 Dizziness and giddiness: Secondary | ICD-10-CM | POA: Insufficient documentation

## 2011-12-29 DIAGNOSIS — R0989 Other specified symptoms and signs involving the circulatory and respiratory systems: Secondary | ICD-10-CM | POA: Diagnosis not present

## 2011-12-29 MED ORDER — TECHNETIUM TC 99M SESTAMIBI GENERIC - CARDIOLITE
30.0000 | Freq: Once | INTRAVENOUS | Status: AC | PRN
  Administered 2011-12-29: 30 via INTRAVENOUS

## 2011-12-29 MED ORDER — TECHNETIUM TC 99M SESTAMIBI GENERIC - CARDIOLITE
10.0000 | Freq: Once | INTRAVENOUS | Status: AC | PRN
  Administered 2011-12-29: 10 via INTRAVENOUS

## 2011-12-29 NOTE — Progress Notes (Signed)
[facility] SITE 3 NUCLEAR MED [id number] [place] [address] [phone]  Cardiology Nuclear Med Study  Dominic Kane is a 71 y.o. male     MRN : [id number]     DOB: 08-04-1940  Procedure Date: 12/29/2011  Nuclear Med Background Indication for Stress Test:  Evaluation for Ischemia History:  NICM, 5/05 Heart Cath: N/O CAD EF: 65% Ascending Aorta dilated  12.11 Cardiac MRI: EF: 48% mild increase aortic root and ascending aorta 4.1 cm, SSS, Idiopathic EF: 35-40% 12/10/11 ECHO: EF: 30% mild LVH Trivial AR Cardiac Risk Factors: Family History - CAD, History of Smoking, Hypertension and Lipids  Symptoms:  Chest Pressure, DOE, Fatigue, Lightheadedness   Nuclear Pre-Procedure Caffeine/Decaff Intake:  8:00pm NPO After: 9:00pm   Lungs:  clear O2 Sat: 94% on room air. IV 0.9% NS with Angio Cath:  20g  IV Site: R Hand  IV Started by:  Cathlyn Parsons, RN  Chest Size (in):  46 Cup Size: n/a  Height: 6\' 2"  (1.88 m)  Weight:  211 lb (95.709 kg)  BMI:  Body mass index is 27.09 kg/(m^2). Tech Comments:  n/a    Nuclear Med Study 1 or 2 day study: 1 day  Stress Test Type:  Stress  Reading MD: Willa Rough, MD  Order Authorizing Provider:  Tedra Senegal  Resting Radionuclide: Technetium 50m Sestamibi  Resting Radionuclide Dose: 11.0 mCi   Stress Radionuclide:  Technetium 56m Sestamibi  Stress Radionuclide Dose: 33.0 mCi           Stress Protocol Rest HR: 60 Stress HR: 60  Rest BP: 117/76 Stress BP: 165/79  Exercise Time (min): 5:59 METS: 7.0   Predicted Max HR: 150 bpm % Max HR: 89.33 bpm Rate Pressure Product: 62952   Dose of Adenosine (mg):  n/a Dose of Lexiscan: n/a mg  Dose of Atropine (mg): n/a Dose of Dobutamine: n/a mcg/kg/min (at max HR)  Stress Test Technologist: Milana Na, EMT-P  Nuclear Technologist:  Domenic Polite, CNMT     Rest Procedure:  Myocardial perfusion imaging was performed at rest 45 minutes  following the intravenous administration of Technetium 7m Sestamibi. Rest ECG: Sinus Bradycardia  Stress Procedure:  The patient performed treadmill exercise using a Bruce  Protocol for 5:59 minutes. The patient stopped due to fatigue and denied any chest pain.  There were no significant ST-T wave changes and occ pvcs.  Technetium 9m Sestamibi was injected at peak exercise and myocardial perfusion imaging was performed after a brief delay. Stress ECG: No significant change from baseline ECG  QPS Raw Data Images:  Patient motion noted; appropriate software correction applied. Stress Images:  There is mild photon reduction in the inferior wall.This represents a small area. Rest Images:  The rest images are similar to the stress images. There is there is mild photon reduction in the inferior wall and a small area. Subtraction (SDS):  There is no definite ischemia. Transient Ischemic Dilatation (Normal <1.22):  1.00 Lung/Heart Ratio (Normal <0.45):  0.34  Quantitative Gated Spect Images QGS EDV:  146 ml QGS ESV:  84 ml  Impression Exercise Capacity:  Fair exercise capacity. BP Response:  Normal blood pressure response. Clinical Symptoms:  mild chest tightness ECG Impression:  No significant ST segment change suggestive of ischemia. Comparison with Prior Nuclear Study: No images to compare  Overall Impression:  There is mild decreased activity in the inferior wall. There is a corresponding wall motion abnormality. This may represent some scar in  this area. 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 There is no significant ischemia seen.  LV Ejection Fraction: 42%.  LV Wall Motion:    There is mild hypokinesis of the inferior wall and the inferior septum.  Willa Rough, MD

## 2011-12-30 NOTE — Progress Notes (Signed)
Nuclear report sent to Dr. Clifton James 12/30/11.Dominic Kane

## 2012-01-07 DIAGNOSIS — Z23 Encounter for immunization: Secondary | ICD-10-CM | POA: Diagnosis not present

## 2012-02-08 ENCOUNTER — Other Ambulatory Visit: Payer: Self-pay | Admitting: Cardiovascular Disease

## 2012-04-20 ENCOUNTER — Ambulatory Visit (INDEPENDENT_AMBULATORY_CARE_PROVIDER_SITE_OTHER): Payer: Medicare Other | Admitting: Family Medicine

## 2012-04-20 ENCOUNTER — Encounter: Payer: Self-pay | Admitting: Family Medicine

## 2012-04-20 VITALS — BP 120/72 | HR 72 | Temp 97.8°F | Ht 74.0 in | Wt 220.5 lb

## 2012-04-20 DIAGNOSIS — R1310 Dysphagia, unspecified: Secondary | ICD-10-CM | POA: Insufficient documentation

## 2012-04-20 DIAGNOSIS — H9190 Unspecified hearing loss, unspecified ear: Secondary | ICD-10-CM | POA: Diagnosis not present

## 2012-04-20 NOTE — Progress Notes (Signed)
  Subjective:    Patient ID: Dominic Kane, male    DOB: Oct 10, 1940, 73 y.o.   MRN: [id number]  HPI  72 year old male with issues swallowing rough or hot foods... Causes regurgitation stomach contents.  Has been going on off and on in last 3-4 months, graduallly worsening frequency. Sometimes able to stop swallowing and it calms down before he regurgitates. no fever. No episgatric pain. No heart burn  No chest pain.  Has history of similar symptoms in 200. Saw Dr. Leone Payor and had area dilated per pt.. I cannot find records of endoscopy.. Only colonoscopy.   He has been having further hearing loss as he ages.. Asks for referral to audiologist for consideration of hearing aid.  Review of Systems  Constitutional: Negative for fever and fatigue.  HENT: Negative for ear pain.   Eyes: Negative for pain.  Respiratory: Negative for shortness of breath.   Cardiovascular: Negative for chest pain.       Objective:   Physical Exam  Constitutional: Vital signs are normal. He appears well-developed and well-nourished.  HENT:  Head: Normocephalic.  Right Ear: Hearing normal.  Left Ear: Hearing normal.  Nose: Nose normal.  Mouth/Throat: Oropharynx is clear and moist and mucous membranes are normal.  Neck: Trachea normal. Carotid bruit is not present. No mass and no thyromegaly present.  Cardiovascular: Normal rate, regular rhythm and normal pulses.  Exam reveals no gallop, no distant heart sounds and no friction rub.   No murmur heard.      No peripheral edema  Pulmonary/Chest: Effort normal and breath sounds normal. No respiratory distress.  Skin: Skin is warm, dry and intact. No rash noted.  Psychiatric: He has a normal mood and affect. His speech is normal and behavior is normal. Thought content normal.          Assessment & Plan:

## 2012-04-20 NOTE — Patient Instructions (Addendum)
Overdue for medicare wellness visit schedule in next few weeks with fasting labs prior.  Stop at front desk for referral.

## 2012-04-20 NOTE — Assessment & Plan Note (Signed)
Ext ears and TMs clear.. Refer to audiologist for eval.

## 2012-04-20 NOTE — Assessment & Plan Note (Signed)
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 No symptoms of reflux. Refer to GI for endoscopy.

## 2012-04-27 DIAGNOSIS — H903 Sensorineural hearing loss, bilateral: Secondary | ICD-10-CM | POA: Diagnosis not present

## 2012-04-27 DIAGNOSIS — R1310 Dysphagia, unspecified: Secondary | ICD-10-CM | POA: Diagnosis not present

## 2012-04-27 DIAGNOSIS — K219 Gastro-esophageal reflux disease without esophagitis: Secondary | ICD-10-CM | POA: Diagnosis not present

## 2012-05-05 ENCOUNTER — Encounter: Payer: Self-pay | Admitting: *Deleted

## 2012-05-07 ENCOUNTER — Other Ambulatory Visit: Payer: Self-pay | Admitting: Cardiovascular Disease

## 2012-05-11 ENCOUNTER — Ambulatory Visit: Payer: Medicare Other | Admitting: Internal Medicine

## 2012-05-13 ENCOUNTER — Other Ambulatory Visit: Payer: Self-pay | Admitting: Cardiovascular Disease

## 2012-05-21 ENCOUNTER — Other Ambulatory Visit: Payer: Self-pay | Admitting: Cardiovascular Disease

## 2012-05-23 ENCOUNTER — Telehealth: Payer: Self-pay | Admitting: Family Medicine

## 2012-05-23 DIAGNOSIS — E785 Hyperlipidemia, unspecified: Secondary | ICD-10-CM

## 2012-05-23 DIAGNOSIS — E559 Vitamin D deficiency, unspecified: Secondary | ICD-10-CM

## 2012-05-23 DIAGNOSIS — Z125 Encounter for screening for malignant neoplasm of prostate: Secondary | ICD-10-CM

## 2012-05-23 DIAGNOSIS — N400 Enlarged prostate without lower urinary tract symptoms: Secondary | ICD-10-CM

## 2012-05-23 NOTE — Telephone Encounter (Signed)
Message copied by Excell Seltzer on Sun May 23, 2012 10:19 PM ------      Message from: Alvina Chou      Created: Fri May 21, 2012  1:03 PM      Regarding: lab orders for Monday,03.3.14       Patient is scheduled for CPX labs, please order future labs, Thanks , Terri             ------

## 2012-05-24 ENCOUNTER — Other Ambulatory Visit (INDEPENDENT_AMBULATORY_CARE_PROVIDER_SITE_OTHER): Payer: Medicare Other

## 2012-05-24 DIAGNOSIS — E785 Hyperlipidemia, unspecified: Secondary | ICD-10-CM

## 2012-05-24 DIAGNOSIS — Z125 Encounter for screening for malignant neoplasm of prostate: Secondary | ICD-10-CM | POA: Diagnosis not present

## 2012-05-24 DIAGNOSIS — N400 Enlarged prostate without lower urinary tract symptoms: Secondary | ICD-10-CM

## 2012-05-24 DIAGNOSIS — E559 Vitamin D deficiency, unspecified: Secondary | ICD-10-CM | POA: Diagnosis not present

## 2012-05-24 LAB — PSA, MEDICARE: PSA: 1.48 ng/ml (ref 0.10–4.00)

## 2012-05-24 LAB — COMPREHENSIVE METABOLIC PANEL
AST: 20 U/L (ref 0–37)
Albumin: 3.9 g/dL (ref 3.5–5.2)
BUN: 20 mg/dL (ref 6–23)
CO2: 25 mEq/L (ref 19–32)
Calcium: 9.5 mg/dL (ref 8.4–10.5)
Chloride: 105 mEq/L (ref 96–112)
Creatinine, Ser: 1 mg/dL (ref 0.4–1.5)
GFR: 77.31 mL/min (ref >60.00)
Potassium: 4.6 mEq/L (ref 3.5–5.1)

## 2012-05-24 LAB — LIPID PANEL
Total CHOL/HDL Ratio: 4
Triglycerides: 106 mg/dL (ref 0.0–149.0)

## 2012-05-25 ENCOUNTER — Other Ambulatory Visit: Payer: Medicare Other

## 2012-06-01 ENCOUNTER — Encounter: Payer: Medicare Other | Admitting: Family Medicine

## 2012-06-15 ENCOUNTER — Other Ambulatory Visit: Payer: Self-pay

## 2012-06-15 MED ORDER — TERAZOSIN HCL 10 MG PO CAPS: 10.0000 mg | ORAL_CAPSULE | Freq: Every day | ORAL | Status: DC

## 2012-06-15 NOTE — Telephone Encounter (Signed)
Dominic Kane with [employer] left message pt transferring refills from [facility] and pt does not have refill on Hytrin. Sent electronically.

## 2012-06-16 ENCOUNTER — Ambulatory Visit (INDEPENDENT_AMBULATORY_CARE_PROVIDER_SITE_OTHER): Payer: Medicare Other | Admitting: Ophthalmology

## 2012-06-21 ENCOUNTER — Other Ambulatory Visit: Payer: Self-pay | Admitting: Cardiovascular Disease

## 2012-06-30 ENCOUNTER — Ambulatory Visit (INDEPENDENT_AMBULATORY_CARE_PROVIDER_SITE_OTHER): Payer: Medicare Other | Admitting: Ophthalmology

## 2012-06-30 DIAGNOSIS — H3530 Unspecified macular degeneration: Secondary | ICD-10-CM

## 2012-06-30 DIAGNOSIS — H35379 Puckering of macula, unspecified eye: Secondary | ICD-10-CM | POA: Diagnosis not present

## 2012-06-30 DIAGNOSIS — H43819 Vitreous degeneration, unspecified eye: Secondary | ICD-10-CM

## 2012-06-30 DIAGNOSIS — H35039 Hypertensive retinopathy, unspecified eye: Secondary | ICD-10-CM

## 2012-06-30 DIAGNOSIS — H2510 Age-related nuclear cataract, unspecified eye: Secondary | ICD-10-CM

## 2012-06-30 DIAGNOSIS — I10 Essential (primary) hypertension: Secondary | ICD-10-CM

## 2012-07-01 ENCOUNTER — Ambulatory Visit (INDEPENDENT_AMBULATORY_CARE_PROVIDER_SITE_OTHER): Payer: Medicare Other | Admitting: Family Medicine

## 2012-07-01 ENCOUNTER — Ambulatory Visit (INDEPENDENT_AMBULATORY_CARE_PROVIDER_SITE_OTHER): Payer: Self-pay | Admitting: Ophthalmology

## 2012-07-01 ENCOUNTER — Encounter: Payer: Self-pay | Admitting: Family Medicine

## 2012-07-01 VITALS — BP 110/74 | HR 75 | Temp 98.2°F | Ht 74.0 in | Wt 214.8 lb

## 2012-07-01 DIAGNOSIS — R1310 Dysphagia, unspecified: Secondary | ICD-10-CM | POA: Diagnosis not present

## 2012-07-01 DIAGNOSIS — Z0000 Encounter for general adult medical examination without abnormal findings: Secondary | ICD-10-CM

## 2012-07-01 DIAGNOSIS — IMO0002 Reserved for concepts with insufficient information to code with codable children: Secondary | ICD-10-CM

## 2012-07-01 DIAGNOSIS — I10 Essential (primary) hypertension: Secondary | ICD-10-CM | POA: Diagnosis not present

## 2012-07-01 DIAGNOSIS — E785 Hyperlipidemia, unspecified: Secondary | ICD-10-CM

## 2012-07-01 MED ORDER — TRAMADOL HCL 50 MG PO TABS: 50.0000 mg | ORAL_TABLET | Freq: Three times a day (TID) | ORAL | Status: DC | PRN

## 2012-07-01 NOTE — Assessment & Plan Note (Signed)
Current exam is normal.. Will given tramadol to use for flares.

## 2012-07-01 NOTE — Progress Notes (Signed)
Subjective:    Patient ID: Dominic Kane, male    DOB: 1941-01-26, 72 y.o.   MRN: [id number]  HPI  I have personally reviewed the Medicare Annual Wellness questionnaire and have noted 1. The patient's medical and social history 2. Their use of alcohol, tobacco or illicit drugs 3. Their current medications and supplements 4. The patient's functional ability including ADL's, fall risks, home safety risks and hearing or visual             impairment. 5. Diet and physical activities 6. Evidence for depression or mood disorders The patients weight, height, BMI and visual acuity have been recorded in the chart I have made referrals, counseling and provided education to the patient based review of the above and I have provided the pt with a written personalized care plan for preventive services.  Currently having some issue with low back pain, occ radiates to left leg, pain returned in 01/2012, has been off and on since then.  Has seen Dr. Patsy Lager for this in past.  In past tramadol and flexeril has helped.  Was worse in winter better now.  Some difficulty sleeping at night.. Some from back. No frequent urination.  Has not tried anything for this.  no depression, no anxiety.  Very active and working outside.  Hypertension:  At goal on current meds. Using medication without problems or lightheadedness: none Chest pain with exertion:None Edema:none Short of breath:none Average home BPs: not checking   Other issues: HX of CAD, aortic insufficiency, thoracic aneyrysm Followed by Dr. Dicie Beam  Seen last 11/2011  Echo stable and stress test low risk for ischemia  Elevated Cholesterol:  LDL at goal on low dose crestor.  Lab Results  Component Value Date   CHOL 125 05/24/2012   HDL 33.50* 05/24/2012   LDLCALC 70 05/24/2012   TRIG 106.0 05/24/2012   CHOLHDL 4 05/24/2012  Using medications without problems: None Muscle aches: None Diet compliance: Good Exercise:Walking and manual  work. Other complaints: CAD Wt Readings from Last 3 Encounters:  07/01/12 214 lb 12 oz (97.41 kg)  04/20/12 220 lb 8 oz (100.018 kg)  12/29/11 211 lb (95.709 kg)       Review of Systems  Constitutional: Negative for fever, fatigue and unexpected weight change.  HENT: Negative for ear pain, congestion, sore throat, rhinorrhea, trouble swallowing and postnasal drip.   Eyes: Negative for pain.  Respiratory: Negative for cough, shortness of breath and wheezing.   Cardiovascular: Negative for chest pain, palpitations and leg swelling.  Gastrointestinal: Negative for nausea, abdominal pain, diarrhea, constipation and blood in stool.  Genitourinary: Negative for dysuria, urgency, hematuria, discharge, penile swelling, scrotal swelling, difficulty urinating, penile pain and testicular pain.  Musculoskeletal: Positive for back pain.  Skin: Negative for rash.  Neurological: Negative for syncope, weakness, light-headedness, numbness and headaches.  Psychiatric/Behavioral: Positive for sleep disturbance. Negative for behavioral problems and dysphoric mood. The patient is not nervous/anxious.        Objective:   Physical Exam  Constitutional: He appears well-developed and well-nourished.  Non-toxic appearance. He does not appear ill. No distress.  HENT:  Head: Normocephalic and atraumatic.  Right Ear: Hearing, tympanic membrane, external ear and ear canal normal.  Left Ear: Hearing, tympanic membrane, external ear and ear canal normal.  Nose: Nose normal.  Mouth/Throat: Uvula is midline, oropharynx is clear and moist and mucous membranes are normal.  Eyes: Conjunctivae, EOM and lids are normal. Pupils are equal, round, and reactive to light. No foreign  bodies found.  Neck: Trachea normal, normal range of motion and phonation normal. Neck supple. Carotid bruit is not present. No mass and no thyromegaly present.  Cardiovascular: Normal rate, regular rhythm, S1 normal, S2 normal, intact distal  pulses and normal pulses.  Exam reveals no gallop.   No murmur heard. Pulmonary/Chest: Breath sounds normal. He has no wheezes. He has no rhonchi. He has no rales.  Abdominal: Soft. Normal appearance and bowel sounds are normal. There is no hepatosplenomegaly. There is no tenderness. There is no rebound, no guarding and no CVA tenderness. No hernia. Hernia confirmed negative in the right inguinal area and confirmed negative in the left inguinal area.  Genitourinary: Prostate normal, testes normal and penis normal. Rectal exam shows no external hemorrhoid, no internal hemorrhoid, no fissure, no mass, no tenderness and anal tone normal. Guaiac negative stool. Prostate is not enlarged and not tender. Right testis shows no mass and no tenderness. Left testis shows no mass and no tenderness. No paraphimosis or penile tenderness.  Musculoskeletal:       Thoracic back: Normal.       Lumbar back: Normal. He exhibits normal range of motion, no tenderness, no bony tenderness and no swelling.  Neg SLR  Lymphadenopathy:    He has no cervical adenopathy.       Right: No inguinal adenopathy present.       Left: No inguinal adenopathy present.  Neurological: He is alert. He has normal strength and normal reflexes. No cranial nerve deficit or sensory deficit. Gait normal.  Skin: Skin is warm, dry and intact. No rash noted.  Psychiatric: He has a normal mood and affect. His speech is normal and behavior is normal. Judgment normal.          Assessment & Plan:  The patient's preventative maintenance and recommended screening tests for an annual wellness exam were reviewed in full today. Brought up to date unless services declined.  Counselled on the importance of diet, exercise, and its role in overall health and mortality. The patient's FH and SH was reviewed, including their home life, tobacco status, and drug and alcohol status.   Vaccines:Uptodate with PNA, due for Td and shingles vaccine  Colon: Nml  2010 , Dr. Leone Payor, repeat in 5 years given past adenoma. Former smoker Lab Results  Component Value Date   PSA 1.48 05/24/2012   PSA 0.27 05/01/2011   PSA 0.32 04/26/2009  Will do one last prostate exam today, then stop at this point on.

## 2012-07-01 NOTE — Assessment & Plan Note (Signed)
Endoscopy showed no stricture.. Esophagitis present.. Improved with omeprazole 40 mg daily.

## 2012-07-01 NOTE — Patient Instructions (Addendum)
Can try melatonin to help with sleep if needed. Try to increase cardiovascular exercise. Look into shingles vaccine coverage. Follow up in 1 year for medicare wellness.

## 2012-07-01 NOTE — Assessment & Plan Note (Signed)
Well controlled. Continue current medication.  

## 2012-07-27 ENCOUNTER — Encounter: Payer: Medicare Other | Admitting: Family Medicine

## 2012-09-29 ENCOUNTER — Other Ambulatory Visit: Payer: Self-pay | Admitting: Cardiovascular Disease

## 2012-10-05 ENCOUNTER — Other Ambulatory Visit: Payer: Self-pay | Admitting: Cardiovascular Disease

## 2012-11-05 ENCOUNTER — Ambulatory Visit (INDEPENDENT_AMBULATORY_CARE_PROVIDER_SITE_OTHER)
Admission: RE | Admit: 2012-11-05 | Discharge: 2012-11-05 | Disposition: A | Discharge status: Home / Self Care | Payer: Medicare Other | Source: Ambulatory Visit | Attending: Internal Medicine | Admitting: Internal Medicine

## 2012-11-05 ENCOUNTER — Other Ambulatory Visit: Payer: Self-pay | Admitting: Cardiovascular Disease

## 2012-11-05 ENCOUNTER — Encounter: Payer: Self-pay | Admitting: Internal Medicine

## 2012-11-05 ENCOUNTER — Ambulatory Visit (INDEPENDENT_AMBULATORY_CARE_PROVIDER_SITE_OTHER): Payer: Medicare Other | Admitting: Internal Medicine

## 2012-11-05 VITALS — BP 110/60 | HR 60 | Temp 98.6°F | Wt 223.0 lb

## 2012-11-05 DIAGNOSIS — IMO0002 Reserved for concepts with insufficient information to code with codable children: Secondary | ICD-10-CM | POA: Diagnosis not present

## 2012-11-05 DIAGNOSIS — M25562 Pain in left knee: Secondary | ICD-10-CM

## 2012-11-05 DIAGNOSIS — M25569 Pain in unspecified knee: Secondary | ICD-10-CM

## 2012-11-05 DIAGNOSIS — M1712 Unilateral primary osteoarthritis, left knee: Secondary | ICD-10-CM | POA: Insufficient documentation

## 2012-11-05 DIAGNOSIS — M1710 Unilateral primary osteoarthritis, unspecified knee: Secondary | ICD-10-CM | POA: Diagnosis not present

## 2012-11-05 NOTE — Patient Instructions (Signed)
You can continue heat and use aleve as needed for your knee. Let me know if it gets worse--we can consider a cortisone shot.

## 2012-11-05 NOTE — Assessment & Plan Note (Signed)
Only this week Doesn't seem to have major arthritis No ligament or meniscus findings  Will check x-ray Discussed exercises and naproxen prn (he uses this at times) Could consider injection if worsens

## 2012-11-05 NOTE — Progress Notes (Signed)
Subjective:    Patient ID: Dominic Kane, male    DOB: [birth year], 72 y.o.   MRN: [id number]  HPI Having problems with his left knee 4-5 days of pain nad has been progressively worsening Now some numbness in left great toe and 2nd toe--when off them (only started this AM) Pain is worst when getting up but then improves with being up for a while Notes limited flexion--- hard to get shoe and sock on  Had surgery on left knee 65 years ago He doesn't know what was done  Knee not clearly swollen Romeo Apple gay on the posterior portion seems to help  Current Outpatient Prescriptions on File Prior to Visit  Medication Sig Dispense Refill  . aspirin 81 MG tablet Take 81 mg by mouth daily.        . CRESTOR 10 MG tablet TAKE ONE TABLET BY MOUTH ONE TIME DAILY  90 tablet  3  . finasteride (PROSCAR) 5 MG tablet TAKE ONE TABLET BY MOUTH ONE TIME DAILY  30 tablet  0  . ramipril (ALTACE) 2.5 MG capsule TAKE ONE CAPSULE BY MOUTH ONE TIME DAILY  30 capsule  3  . terazosin (HYTRIN) 10 MG capsule Take 1 capsule (10 mg total) by mouth daily.  90 capsule  1  . traMADol (ULTRAM) 50 MG tablet Take 1 tablet (50 mg total) by mouth every 8 (eight) hours as needed for pain.  30 tablet  0   No current facility-administered medications on file prior to visit.    Allergies  Allergen Reactions  . Codeine     REACTION: nausea  . Sulfonamide Derivatives     REACTION: hives    Past Medical History  Diagnosis Date  . Coronary atherosclerosis of native coronary artery   . Acute sinusitis, unspecified   . 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 Unspecified vitamin D deficiency   . Dysfunction of eustachian tube   . Abnormality of gait   . Aortic valve disorders   . Sinoatrial node dysfunction   . Lipoprotein deficiencies   . Other primary cardiomyopathies   . Unspecified essential hypertension   . Other and unspecified hyperlipidemia   . Displacement of lumbar intervertebral disc without  myelopathy   . Unspecified disorder of skin and subcutaneous tissue   . Benign paroxysmal positional vertigo   . Headache(784.0)   . Hypertrophy of prostate without urinary obstruction and other lower urinary tract symptoms (LUTS)   . Pain in joint, lower leg   . Pes anserinus tendinitis or bursitis   . Thoracic or lumbosacral neuritis or radiculitis, unspecified   . Personal history of other diseases of digestive system   . Personal history of colonic polyps   . Diverticulosis of colon (without mention of hemorrhage)     Past Surgical History  Procedure Laterality Date  . Knee surgery  1947  . Esophagogastroduodenoscopy  11/07    Schatzki's ring, non bleeding erosive gastropathy [place]  . Dobutamine stress echo  1/07    Lateral hypokinesis but no ischemia  . Pulmonary functioning tests  1. 2003  2. 2005    1. Diminished lung capacity  2. Stable    Family History  Problem Relation Age of Onset  . Heart failure Mother   . Coronary artery disease Mother   . Stroke Mother   . Cancer Father     LUNG  . Cancer Sister     BREAST  . Heart failure Maternal Grandmother  History   Social History  . Marital Status: Married    Spouse Name: N/A    Number of Children: 1  . Years of Education: N/A   Occupational History  . retired Museum/gallery curator    Social History Main Topics  . Smoking status: Former Smoker    Quit date: 07/03/1980  . Smokeless tobacco: Never Used  . Alcohol Use: Yes  . Drug Use: No  . Sexual Activity: Not on file   Other Topics Concern  . Not on file   Social History Narrative   REGULAR EXERCISE-Yes, bicycle 4 times per week      Diet: fruit and veggies, 40lb weight loss in 2 years   Review of Systems No sickness No fever    Objective:   Physical Exam  Constitutional: He appears well-developed and well-nourished. No distress.  Musculoskeletal:  Large medial left knee scar Limited passive flexion (~100 degrees) of left knee No  ligament instability or meniscus findings No sig swelling No crepitus  Moderate decreased internal rotation of left hip but no tenderness  Neurological:  Gait normal No focal weakness Mild decreased sensation in left 1st and 2nd toes          Assessment & Plan:

## 2012-11-08 ENCOUNTER — Encounter: Payer: Self-pay | Admitting: *Deleted

## 2012-11-15 ENCOUNTER — Encounter: Payer: Self-pay | Admitting: Internal Medicine

## 2012-11-15 ENCOUNTER — Ambulatory Visit (INDEPENDENT_AMBULATORY_CARE_PROVIDER_SITE_OTHER): Payer: Medicare Other | Admitting: Internal Medicine

## 2012-11-15 VITALS — BP 110/70 | HR 77 | Temp 98.6°F | Wt 219.0 lb

## 2012-11-15 DIAGNOSIS — IMO0002 Reserved for concepts with insufficient information to code with codable children: Secondary | ICD-10-CM | POA: Diagnosis not present

## 2012-11-15 DIAGNOSIS — M1710 Unilateral primary osteoarthritis, unspecified knee: Secondary | ICD-10-CM | POA: Diagnosis not present

## 2012-11-15 DIAGNOSIS — M1712 Unilateral primary osteoarthritis, left knee: Secondary | ICD-10-CM

## 2012-11-15 MED ORDER — DICLOFENAC SODIUM 1 % TD GEL: 2.0000 g | Freq: Four times a day (QID) | TRANSDERMAL | Status: DC | PRN

## 2012-11-15 NOTE — Progress Notes (Signed)
Subjective:    Patient ID: Dominic Kane, male    DOB: 20-Jan-1941, 72 y.o.   MRN: [id number]  HPI Still having bad knee pain Suddenly seems better today Using his wife's voltaren gel since last week  Trouble getting out of bed Pain for 30 minutes or so when first getting up--then more tolerable Swelling is not apparent Current Outpatient Prescriptions on File Prior to Visit  Medication Sig Dispense Refill  . aspirin 81 MG tablet Take 81 mg by mouth daily.        . CRESTOR 10 MG tablet TAKE ONE TABLET BY MOUTH ONE TIME DAILY  90 tablet  3  . finasteride (PROSCAR) 5 MG tablet TAKE ONE TABLET BY MOUTH ONE TIME DAILY   30 tablet  6  . ramipril (ALTACE) 2.5 MG capsule TAKE ONE CAPSULE BY MOUTH ONE TIME DAILY  30 capsule  3  . terazosin (HYTRIN) 10 MG capsule Take 1 capsule (10 mg total) by mouth daily.  90 capsule  1   No current facility-administered medications on file prior to visit.    Allergies  Allergen Reactions  . Codeine     REACTION: nausea  . Sulfonamide Derivatives     REACTION: hives    Past Medical History  Diagnosis Date  . Coronary atherosclerosis of native coronary artery   . Acute sinusitis, unspecified   . 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 Unspecified vitamin D deficiency   . Dysfunction of eustachian tube   . Abnormality of gait   . Aortic valve disorders   . Sinoatrial node dysfunction   . Lipoprotein deficiencies   . Other primary cardiomyopathies   . Unspecified essential hypertension   . Other and unspecified hyperlipidemia   . Displacement of lumbar intervertebral disc without myelopathy   . Unspecified disorder of skin and subcutaneous tissue   . Benign paroxysmal positional vertigo   . Headache(784.0)   . Hypertrophy of prostate without urinary obstruction and other lower urinary tract symptoms (LUTS)   . Pain in joint, lower leg   . Pes anserinus tendinitis or bursitis   . Thoracic or lumbosacral neuritis or radiculitis,  unspecified   . Personal history of other diseases of digestive system   . Personal history of colonic polyps   . Diverticulosis of colon (without mention of hemorrhage)     Past Surgical History  Procedure Laterality Date  . Knee surgery  1947  . Esophagogastroduodenoscopy  11/07    Schatzki's ring, non bleeding erosive gastropathy [place]  . Dobutamine stress echo  1/07    Lateral hypokinesis but no ischemia  . Pulmonary functioning tests  1. 2003  2. 2005    1. Diminished lung capacity  2. Stable    Family History  Problem Relation Age of Onset  . Heart failure Mother   . Coronary artery disease Mother   . Stroke Mother   . Cancer Father     LUNG  . Cancer Sister     BREAST  . Heart failure Maternal Grandmother     History   Social History  . Marital Status: Married    Spouse Name: N/A    Number of Children: 1  . Years of Education: N/A   Occupational History  . retired Museum/gallery curator    Social History Main Topics  . Smoking status: Former Smoker    Quit date: 07/03/1980  . Smokeless tobacco: Never Used  . Alcohol Use: Yes  . Drug Use:  No  . Sexual Activity: Not on file   Other Topics Concern  . Not on file   Social History Narrative   REGULAR EXERCISE-Yes, bicycle 4 times per week      Diet: fruit and veggies, 40lb weight loss in 2 years   Review of Systems No fever No redness    Objective:   Physical Exam  Constitutional: He appears well-developed and well-nourished. No distress.  Musculoskeletal:  No left knee swelling Good range of motion  Neurological:  Gait normal No weakness          Assessment & Plan:

## 2012-11-15 NOTE — Assessment & Plan Note (Signed)
Suddenly better this AM X-ray only shows mild arthritis Presumably the voltaren gel is now working  Discussed the cortisone shot Since he is doing well now, we will hold off

## 2012-11-25 ENCOUNTER — Other Ambulatory Visit: Payer: Self-pay | Admitting: Family Medicine

## 2012-11-26 NOTE — Telephone Encounter (Signed)
Ok to refill x 1 year per Dr. Ermalene Searing.

## 2012-11-29 ENCOUNTER — Ambulatory Visit: Payer: Medicare Other | Admitting: Cardiovascular Disease

## 2012-12-08 ENCOUNTER — Other Ambulatory Visit: Payer: Self-pay | Admitting: Internal Medicine

## 2012-12-08 NOTE — Telephone Encounter (Signed)
Ok to fill 

## 2012-12-15 DIAGNOSIS — L570 Actinic keratosis: Secondary | ICD-10-CM | POA: Diagnosis not present

## 2012-12-15 DIAGNOSIS — D485 Neoplasm of uncertain behavior of skin: Secondary | ICD-10-CM | POA: Diagnosis not present

## 2012-12-15 DIAGNOSIS — L821 Other seborrheic keratosis: Secondary | ICD-10-CM | POA: Diagnosis not present

## 2012-12-28 DIAGNOSIS — D485 Neoplasm of uncertain behavior of skin: Secondary | ICD-10-CM | POA: Diagnosis not present

## 2012-12-28 DIAGNOSIS — L905 Scar conditions and fibrosis of skin: Secondary | ICD-10-CM | POA: Diagnosis not present

## 2013-01-02 ENCOUNTER — Other Ambulatory Visit: Payer: Self-pay | Admitting: Cardiovascular Disease

## 2013-01-06 ENCOUNTER — Encounter: Payer: Self-pay | Admitting: Cardiovascular Disease

## 2013-01-06 ENCOUNTER — Ambulatory Visit (INDEPENDENT_AMBULATORY_CARE_PROVIDER_SITE_OTHER): Payer: Medicare Other | Admitting: Cardiovascular Disease

## 2013-01-06 VITALS — BP 158/100 | HR 59 | Ht 74.0 in | Wt 219.4 lb

## 2013-01-06 DIAGNOSIS — I2510 Atherosclerotic heart disease of native coronary artery without angina pectoris: Secondary | ICD-10-CM

## 2013-01-06 DIAGNOSIS — E785 Hyperlipidemia, unspecified: Secondary | ICD-10-CM | POA: Diagnosis not present

## 2013-01-06 DIAGNOSIS — I428 Other cardiomyopathies: Secondary | ICD-10-CM

## 2013-01-06 DIAGNOSIS — I712 Thoracic aortic aneurysm, without rupture, unspecified: Secondary | ICD-10-CM | POA: Diagnosis not present

## 2013-01-06 DIAGNOSIS — Z23 Encounter for immunization: Secondary | ICD-10-CM | POA: Diagnosis not present

## 2013-01-06 LAB — BASIC METABOLIC PANEL
BUN: 17 mg/dL (ref 6–23)
CO2: 28 mEq/L (ref 19–32)
Glucose, Bld: 89 mg/dL (ref 70–99)
Potassium: 3.8 mEq/L (ref 3.5–5.1)
Sodium: 138 mEq/L (ref 135–145)

## 2013-01-06 MED ORDER — FUROSEMIDE 20 MG PO TABS: 20.0000 mg | ORAL_TABLET | Freq: Every day | ORAL | Status: DC

## 2013-01-06 NOTE — Patient Instructions (Signed)
Your physician wants you to follow-up in:  3 months. You will receive a reminder letter in the mail two months in advance. If you don't receive a letter, please call our office to schedule the follow-up appointment.  Your physician has requested that you have a cardiac MRI. Cardiac MRI uses a computer to create images of your heart as its beating, producing both still and moving pictures of your heart and major blood vessels. For further information please visit [email or web address].    Your physician has recommended you make the following change in your medication:  Start furosemide 20 mg by mouth daily

## 2013-01-06 NOTE — Progress Notes (Signed)
History of Present Illness: [name] is a pleasant 72 year-old Caucasian male with a past medical history significant for hypertension, nonobstructive coronary artery disease, idiopathic cardiomyopathy, benign prostatic hypertrophy, hyperlipidemia and hiatal hernia, who presents today for routine cardiac followup. [name] had an episode of chest pain about 7 years ago; at which time, he was evaluated by the [facility] in [place], [place]. He was apparently known to have an ejection fraction of 35-40%, which was felt to be idiopathic. Left heart catheterization was performed in May 2005 and showed diffuse 15% irregularities in the LAD, diffuse 20% irregularities in the ramus intermedius branch, diffuse 30% irregularities in the nondominant circumflex, and a 50% stenosis in the proximal RCA with irregularities in the midportion of the vessel. The ascending aorta was mildly dilated with no evidence of dissection during that catheterization. Ejection fraction was noted to be 65%. An echocardiogram during that admission in May 2005 in [place] showed an ejection fraction of 55-60% with the ascending aorta at the sinotubular junction measuring 4.1 cm. We got an echo in February 2010 which showed mild systolic and diastolic dysfunction with LVEF of 45%. There was mild to moderate aortic stenosis. Follow up echo on January 13,2011 with mild global LV dysfunction, EF 45%, no significant aortic stenosis, mild aortic regurgitation and mildly dilated aortic root (stable in size). Cardiac MRI December 2011 showed mild enlargement of the aortic root and EF of 48%.   He is here today for follow up. He describes dyspnea. Worsened with bending over.  This has been present for several months. No worsening. No awareness of palpitations, near syncope, syncope, orthopnea, PND, LE edema. He is very active. Weight up by 5 lbs last year.   Primary Care Physician: Kerby Nora  Last Lipid Profile:Lipid Panel    Component Value Date/Time   CHOL 125 05/24/2012 0818   TRIG 106.0 05/24/2012 0818   HDL 33.50* 05/24/2012 0818   CHOLHDL 4 05/24/2012 0818   VLDL 21.2 05/24/2012 0818   LDLCALC 70 05/24/2012 0818     Past Medical History  Diagnosis Date  . Coronary atherosclerosis of native coronary artery   . Acute sinusitis, unspecified   . 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 Unspecified vitamin D deficiency   . Dysfunction of eustachian tube   . Abnormality of gait   . Aortic valve disorders   . Sinoatrial node dysfunction   . Lipoprotein deficiencies   . Other primary cardiomyopathies   . Unspecified essential hypertension   . Other and unspecified hyperlipidemia   . Displacement of lumbar intervertebral disc without myelopathy   . Unspecified disorder of skin and subcutaneous tissue   . Benign paroxysmal positional vertigo   . Headache(784.0)   . Hypertrophy of prostate without urinary obstruction and other lower urinary tract symptoms (LUTS)   . Pain in joint, lower leg   . Pes anserinus tendinitis or bursitis   . Thoracic or lumbosacral neuritis or radiculitis, unspecified   . Personal history of other diseases of digestive system   . Personal history of colonic polyps   . Diverticulosis of colon (without mention of hemorrhage)     Past Surgical History  Procedure Laterality Date  . Knee surgery  1947  . Esophagogastroduodenoscopy  11/07    Schatzki's ring, non bleeding erosive gastropathy [place]  . Dobutamine stress echo  1/07    Lateral hypokinesis but no ischemia  . Pulmonary functioning tests  1. 2003  2.  2005    1. Diminished lung capacity  2. Stable    Current Outpatient Prescriptions  Medication Sig Dispense Refill  . aspirin 81 MG tablet Take 81 mg by mouth daily.        . CRESTOR 10 MG tablet TAKE ONE TABLET BY MOUTH ONE TIME DAILY  90 tablet  3  . finasteride (PROSCAR) 5 MG tablet TAKE ONE TABLET BY MOUTH ONE TIME DAILY   30 tablet  6  . Multiple  Vitamins-Minerals (OCUVITE PRESERVISION PO) Take by mouth daily.      . ramipril (ALTACE) 2.5 MG capsule Take one capsule by mouth one time daily  30 capsule  2  . terazosin (HYTRIN) 10 MG capsule Take one capsule by mouth one time daily  90 capsule  3  . VOLTAREN 1 % GEL APPLY 2GM TO AFFECTED AREA(S) FOUR TIMES A DAY AS NEEDED   100 g  0   No current facility-administered medications for this visit.    Allergies  Allergen Reactions  . Codeine     REACTION: nausea  . Sulfonamide Derivatives     REACTION: hives    History   Social History  . Marital Status: Married    Spouse Name: N/A    Number of Children: 1  . Years of Education: N/A   Occupational History  . retired Museum/gallery curator    Social History Main Topics  . Smoking status: Former Smoker    Quit date: 07/03/1980  . Smokeless tobacco: Never Used  . Alcohol Use: Yes  . Drug Use: No  . Sexual Activity: Not on file   Other Topics Concern  . Not on file   Social History Narrative   REGULAR EXERCISE-Yes, bicycle 4 times per week      Diet: fruit and veggies, 40lb weight loss in 2 years    Family History  Problem Relation Age of Onset  . Heart failure Mother   . Coronary artery disease Mother   . Stroke Mother   . Cancer Father     LUNG  . Cancer Sister     BREAST  . Heart failure Maternal Grandmother     Review of Systems:  As stated in the HPI and otherwise negative.   BP 158/100  Pulse 59  Ht 6\' 2"  (1.88 m)  Wt 219 lb 6.4 oz (99.519 kg)  BMI 28.16 kg/m2  Physical Examination: General: Well developed, well nourished, NAD HEENT: OP clear, mucus membranes moist SKIN: warm, dry. No rashes. Neuro: No focal deficits Musculoskeletal: Muscle strength 5/5 all ext Psychiatric: Mood and affect normal Neck: No JVD, no carotid bruits, no thyromegaly, no lymphadenopathy. Lungs:Clear bilaterally, no wheezes, rhonci, crackles Cardiovascular: Regular rate and rhythm. No murmurs, gallops or  rubs. Abdomen:Soft. Bowel sounds present. Non-tender.  Extremities: No lower extremity edema. Pulses are 2 + in the bilateral DP/PT.  EKG: Sinus brady, rate 59 bpm. RBBB. LAFB. LVH.   Echo 12/10/11: Left ventricle: The cavity size was normal. Wall thickness was increased in a pattern of mild LVH. The estimated ejection fraction was 30%. Diffuse hypokinesis. Doppler parameters are consistent with abnormal left ventricular relaxation (grade 1 diastolic dysfunction). - Aortic valve: There was no stenosis. Trivial regurgitation. - Aorta: Ascending aorta dimension: 43 mm. Mildly dilated aortic root and ascending aorta. Aortic root dimension:20mm (ED). - Mitral valve: Trivial regurgitation. - Left atrium: The atrium was mildly dilated. - Right ventricle: The cavity size was normal. Systolic function was mildly to moderately reduced. - Tricuspid  valve: Peak RV-RA gradient: 22mm Hg (S). - Pulmonary arteries: PA peak pressure: 32mm Hg (S). - Systemic veins: IVC measured 1.9 cm with some respirophasic variation, suggesting RA pressure 10 mmHg. Impressions:  - Normal LV size with mild LV hypertrophy. EF 30% with diffuse hypokinesis. Normal RV size with mild to moderate hypokinesis. No significant valvular abnormalities. Dilation of the aortic root and ascending aorta. Would consider MRA of the chest to further delineate the thoracic aorta.    Assessment and Plan:   1. CAD: No chest pains. Appears stable. Recent normal stress test September 2013. He is known to have non-obstructive CAD by cath 2005. Continue ASA and statin.   2. Non-ischemic Cardiomyopathy: LVEF 48% by cardiac MRI 12/11 but 30% by echo September 2013. Repeat cardiac MRI now to assess LV function and aortic root. Continue Ace-inh. No beta blocker. Weight overall stable but some SOB. Will start Lasix 20 mg po Qdaily. Check BMET today. If LVEF still below 35% on MRI, will need repeat cath and then discuss ICD.   3. Thoracic  aortic aneurysm:  Last imaging 12/11. Cardiac MRI/MRA to assess.     4. HLD: Continue statin.

## 2013-01-10 ENCOUNTER — Encounter: Payer: Self-pay | Admitting: Cardiovascular Disease

## 2013-01-10 NOTE — Telephone Encounter (Signed)
This encounter was created in error - please disregard.

## 2013-01-10 NOTE — Telephone Encounter (Signed)
Follow Up  Pt returning call// please assist.

## 2013-01-23 ENCOUNTER — Emergency Department (HOSPITAL_COMMUNITY)
Admission: EM | Admit: 2013-01-23 | Discharge: 2013-01-23 | Disposition: A | Discharge status: Home / Self Care | Payer: Medicare Other | Attending: Emergency Medicine | Admitting: Emergency Medicine

## 2013-01-23 ENCOUNTER — Emergency Department (HOSPITAL_COMMUNITY): Payer: Medicare Other

## 2013-01-23 ENCOUNTER — Encounter (HOSPITAL_COMMUNITY): Payer: Self-pay | Admitting: Emergency Medicine

## 2013-01-23 DIAGNOSIS — M5126 Other intervertebral disc displacement, lumbar region: Secondary | ICD-10-CM | POA: Diagnosis not present

## 2013-01-23 DIAGNOSIS — Z9889 Other specified postprocedural states: Secondary | ICD-10-CM | POA: Insufficient documentation

## 2013-01-23 DIAGNOSIS — Z79899 Other long term (current) drug therapy: Secondary | ICD-10-CM | POA: Insufficient documentation

## 2013-01-23 DIAGNOSIS — I10 Essential (primary) hypertension: Secondary | ICD-10-CM | POA: Insufficient documentation

## 2013-01-23 DIAGNOSIS — Z87891 Personal history of nicotine dependence: Secondary | ICD-10-CM | POA: Insufficient documentation

## 2013-01-23 DIAGNOSIS — Z8639 Personal history of other endocrine, nutritional and metabolic disease: Secondary | ICD-10-CM | POA: Diagnosis not present

## 2013-01-23 DIAGNOSIS — Z860100 Personal history of colon polyps, unspecified: Secondary | ICD-10-CM | POA: Insufficient documentation

## 2013-01-23 DIAGNOSIS — I2510 Atherosclerotic heart disease of native coronary artery without angina pectoris: Secondary | ICD-10-CM | POA: Diagnosis not present

## 2013-01-23 DIAGNOSIS — N400 Enlarged prostate without lower urinary tract symptoms: Secondary | ICD-10-CM | POA: Diagnosis not present

## 2013-01-23 DIAGNOSIS — Z872 Personal history of diseases of the skin and subcutaneous tissue: Secondary | ICD-10-CM | POA: Diagnosis not present

## 2013-01-23 DIAGNOSIS — K922 Gastrointestinal hemorrhage, unspecified: Secondary | ICD-10-CM | POA: Diagnosis not present

## 2013-01-23 DIAGNOSIS — Z8669 Personal history of other diseases of the nervous system and sense organs: Secondary | ICD-10-CM | POA: Diagnosis not present

## 2013-01-23 DIAGNOSIS — E785 Hyperlipidemia, unspecified: Secondary | ICD-10-CM | POA: Diagnosis not present

## 2013-01-23 DIAGNOSIS — Z8709 Personal history of other diseases of the respiratory system: Secondary | ICD-10-CM | POA: Insufficient documentation

## 2013-01-23 DIAGNOSIS — K5730 Diverticulosis of large intestine without perforation or abscess without bleeding: Secondary | ICD-10-CM | POA: Diagnosis not present

## 2013-01-23 DIAGNOSIS — Z7982 Long term (current) use of aspirin: Secondary | ICD-10-CM | POA: Diagnosis not present

## 2013-01-23 LAB — COMPREHENSIVE METABOLIC PANEL
ALT: 13 U/L (ref 0–53)
AST: 15 U/L (ref 0–37)
Albumin: 3.8 g/dL (ref 3.5–5.2)
Alkaline Phosphatase: 78 U/L (ref 39–117)
Chloride: 104 mEq/L (ref 96–112)
GFR calc non Af Amer: 66 mL/min — ABNORMAL LOW (ref >90)
Glucose, Bld: 113 mg/dL — ABNORMAL HIGH (ref 70–99)
Potassium: 3.9 mEq/L (ref 3.5–5.1)
Sodium: 138 mEq/L (ref 135–145)
Total Protein: 7.1 g/dL (ref 6.0–8.3)

## 2013-01-23 LAB — OCCULT BLOOD, POC DEVICE: Fecal Occult Bld: POSITIVE — AB

## 2013-01-23 LAB — CBC
Hemoglobin: 12.9 g/dL — ABNORMAL LOW (ref 13.0–17.0)
MCHC: 34.4 g/dL (ref 30.0–36.0)
Platelets: 182 10*3/uL (ref 150–400)
RBC: 4.15 MIL/uL — ABNORMAL LOW (ref 4.22–5.81)
RDW: 13.1 % (ref 11.5–15.5)

## 2013-01-23 MED ORDER — IOHEXOL 300 MG/ML  SOLN
100.0000 mL | Freq: Once | INTRAMUSCULAR | Status: AC | PRN
  Administered 2013-01-23: 100 mL via INTRAVENOUS

## 2013-01-23 MED ORDER — IOHEXOL 300 MG/ML  SOLN
25.0000 mL | Freq: Once | INTRAMUSCULAR | Status: AC | PRN
  Administered 2013-01-23: 25 mL via ORAL

## 2013-01-23 NOTE — ED Notes (Signed)
CT notified that pt finished drinking contrast. 

## 2013-01-23 NOTE — ED Notes (Signed)
Pt reports onset Friday of dark black stools and abd pressure, denies any pain. occ nausea, no vomiting. Reports hx of same.

## 2013-01-23 NOTE — ED Notes (Signed)
MD at bedside. 

## 2013-01-23 NOTE — ED Provider Notes (Signed)
CSN: [id number]     Arrival date & time 01/23/13  1631 History   First MD Initiated Contact with Patient 01/23/13 1918     Chief Complaint  Patient presents with  . GI Problem   (Consider location/radiation/quality/duration/timing/severity/associated sxs/prior Treatment) HPI Comments: Patient presents today with a chief complaint of melena.  He reports that he has had dark black stool for the past 3 days.  He has had approximately two episodes daily.  He also reports that he has noticed a small amount of bright red blood mixed in with the stool.  He denies any rectal bleeding in between bowel movements.  He denies any abdominal pain, nausea, or vomiting.  He denies fever or chills.  He does have a prior history of Diverticulosis and reports that he has had prior GI bleeds secondary to this.  He is currently on 81 mg ASA, but no other anticoagulants.  He reports that his last Colonoscopy was approximately five years ago and was done by [facility].  He reports that he was diagnosed with Diverticulosis and Polyps at that time.  He denies any SOB, weakness, dizziness, or lightheadedness.  The history is provided by the patient.    Past Medical History  Diagnosis Date  . Coronary atherosclerosis of native coronary artery   . Acute sinusitis, unspecified   . 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 Unspecified vitamin D deficiency   . Dysfunction of eustachian tube   . Abnormality of gait   . Aortic valve disorders   . Sinoatrial node dysfunction   . Lipoprotein deficiencies   . Other primary cardiomyopathies   . Unspecified essential hypertension   . Other and unspecified hyperlipidemia   . Displacement of lumbar intervertebral disc without myelopathy   . Unspecified disorder of skin and subcutaneous tissue   . Benign paroxysmal positional vertigo   . Headache(784.0)   . Hypertrophy of prostate without urinary obstruction and other lower urinary tract symptoms (LUTS)   . Pain in  joint, lower leg   . Pes anserinus tendinitis or bursitis   . Thoracic or lumbosacral neuritis or radiculitis, unspecified   . Personal history of other diseases of digestive system   . Personal history of colonic polyps   . Diverticulosis of colon (without mention of hemorrhage)    Past Surgical History  Procedure Laterality Date  . Knee surgery  1947  . Esophagogastroduodenoscopy  11/07    Schatzki's ring, non bleeding erosive gastropathy [place]  . Dobutamine stress echo  1/07    Lateral hypokinesis but no ischemia  . Pulmonary functioning tests  1. 2003  2. 2005    1. Diminished lung capacity  2. Stable   Family History  Problem Relation Age of Onset  . Heart failure Mother   . Coronary artery disease Mother   . Stroke Mother   . Cancer Father     LUNG  . Cancer Sister     BREAST  . Heart failure Maternal Grandmother    History  Substance Use Topics  . Smoking status: Former Smoker    Quit date: 07/03/1980  . Smokeless tobacco: Never Used  . Alcohol Use: Yes    Review of Systems  All other systems reviewed and are negative.    Allergies  Codeine and Sulfonamide derivatives  Home Medications   Current Outpatient Rx  Name  Route  Sig  Dispense  Refill  . aspirin 81 MG tablet   Oral   Take  81 mg by mouth daily.           . CRESTOR 10 MG tablet      TAKE ONE TABLET BY MOUTH ONE TIME DAILY   90 tablet   3   . finasteride (PROSCAR) 5 MG tablet      TAKE ONE TABLET BY MOUTH ONE TIME DAILY    30 tablet   6   . furosemide (LASIX) 20 MG tablet   Oral   Take 1 tablet (20 mg total) by mouth daily.   30 tablet   6   . Multiple Vitamins-Minerals (OCUVITE PRESERVISION PO)   Oral   Take by mouth daily.         [name] omeprazole (PRILOSEC) 40 MG capsule   Oral   Take 40 mg by mouth daily.         . ramipril (ALTACE) 2.5 MG capsule      Take one capsule by mouth one time daily   30 capsule   2     .Marland KitchenPatient needs to contact office to  schedule  App ...   . terazosin (HYTRIN) 10 MG capsule      Take one capsule by mouth one time daily   90 capsule   3   . VOLTAREN 1 % GEL      APPLY 2GM TO AFFECTED AREA(S) FOUR TIMES A DAY AS NEEDED    100 g   0    BP 99/69  Pulse 99  Temp(Src) 97.8 F (36.6 C) (Oral)  Resp 31  Wt 212 lb 1.6 oz (96.208 kg)  SpO2 92% Physical Exam  Nursing note and vitals reviewed. Constitutional: He appears well-developed and well-nourished.  HENT:  Head: Normocephalic and atraumatic.  Mouth/Throat: Oropharynx is clear and moist.  Neck: Normal range of motion. Neck supple.  Cardiovascular: Normal rate, regular rhythm and normal heart sounds.   Pulmonary/Chest: Effort normal and breath sounds normal.  Abdominal: Soft. Bowel sounds are normal. He exhibits no distension and no mass. There is no tenderness. There is no rebound and no guarding.  Genitourinary: Rectal exam shows no fissure. Guaiac positive stool.  Dark red blood stool  Neurological: He is alert.  Skin: Skin is warm and dry.  Psychiatric: He has a normal mood and affect.    ED Course  Procedures (including critical care time) Labs Review Labs Reviewed  CBC - Abnormal; Notable for the following:    RBC 4.15 (*)    Hemoglobin 12.9 (*)    HCT 37.5 (*)    All other components within normal limits  COMPREHENSIVE METABOLIC PANEL - Abnormal; Notable for the following:    Glucose, Bld 113 (*)    BUN 26 (*)    GFR calc non Af Amer 66 (*)    GFR calc Af Amer 76 (*)    All other components within normal limits  OCCULT BLOOD, POC DEVICE - Abnormal; Notable for the following:    Fecal Occult Bld POSITIVE (*)    All other components within normal limits   Imaging Review Ct Abdomen Pelvis W Contrast  01/23/2013   CLINICAL DATA:  72 year old male with history of dark black stools and abdominal pain. Initial encounter.  EXAM: CT ABDOMEN AND PELVIS WITH CONTRAST  TECHNIQUE: Multidetector CT imaging of the abdomen and pelvis was  performed using the standard protocol following bolus administration of intravenous contrast.  CONTRAST:  OMNIPAQUE IOHEXOL 300 MG/ML  SOLN  COMPARISON:  None.  FINDINGS: No pericardial  or pleural effusion. Mild scarring or atelectasis at the lung bases.  Scoliosis. Degenerative changes in the spine, including grade 1 spondylolisthesis at the lumbosacral junction with advanced disc and facet degeneration. Associated chronic L5 pars fractures. No acute osseous abnormality identified.  No pelvic free fluid. Negative bladder. Negative rectum and distal sigmoid colon.  Redundant sigmoid with severe diverticulosis of the proximal sigmoid segment and throughout the more proximal colon (including the left colon, transverse, and right colon). Normal appendix and terminal ileum. Oral contrast administered but has not yet reached the distal small bowel. No dilated small bowel loops. No definite large bowel mesenteric stranding or wall thickening.  Negative stomach and duodenum. Negative liver, gallbladder, spleen, and adrenal glands. Scattered parenchymal calcification of the pancreas which appears mildly atrophied. No definite pancreatic inflammation.  Portal venous system within normal limits. Aortoiliac calcified atherosclerosis noted. Major arterial structures in the abdomen and pelvis are patent.  Mild to moderate bilateral perinephric stranding may be age related. No renal or ureteral obstruction. Hypodense areas in both kidneys are too small to characterize but most likely benign cysts.  No lymphadenopathy or abdominal free fluid.  IMPRESSION: 1. Severe diverticulosis throughout the colon, sparing only the distal sigmoid and rectum. No definite acute diverticulitis. If GI bleeding is suspected, consider bleeding diverticular disease.  2. No bowel obstruction or definite focal inflammatory process in the abdomen or pelvis.  3. Chronic findings, including L5-S1 spondylolisthesis, spondylolysis, and associated  spinal degeneration.   Electronically Signed   By: Augusto Gamble M.D.   On: 01/23/2013 22:26    EKG Interpretation   None      Patient discussed with Dr. Micheline Maze who also evaluated the patient.   MDM  No diagnosis found. Patient presenting with a chief complaint of melena and also hematochezia.  Hemoccult positive.  Hemoglobin 12.9.  Patient is hemodynamically stable.  No vomiting.  No abdominal pain.  CT abdomen/pelvis showing severe diverticulosis, but no acute findings.  Suspect diverticular bleed.  Feel that the patient is stable for discharge. Patient instructed to follow up with GI and PCP.  Dr. Micheline Maze in agreement with plan for discharge.  Strict return precautions given to the patient.    Santiago Glad, PA-C 01/24/13 1200

## 2013-01-24 ENCOUNTER — Ambulatory Visit (INDEPENDENT_AMBULATORY_CARE_PROVIDER_SITE_OTHER): Payer: Medicare Other | Admitting: Internal Medicine

## 2013-01-24 ENCOUNTER — Encounter: Payer: Self-pay | Admitting: Internal Medicine

## 2013-01-24 ENCOUNTER — Telehealth: Payer: Self-pay

## 2013-01-24 VITALS — BP 104/64 | HR 64 | Temp 97.9°F | Ht 74.0 in | Wt 214.8 lb

## 2013-01-24 DIAGNOSIS — K5730 Diverticulosis of large intestine without perforation or abscess without bleeding: Secondary | ICD-10-CM

## 2013-01-24 DIAGNOSIS — K921 Melena: Secondary | ICD-10-CM

## 2013-01-24 DIAGNOSIS — K5790 Diverticulosis of intestine, part unspecified, without perforation or abscess without bleeding: Secondary | ICD-10-CM

## 2013-01-24 MED ORDER — SUCRALFATE 1 G PO TABS: 1.0000 g | ORAL_TABLET | Freq: Three times a day (TID) | ORAL | Status: DC

## 2013-01-24 MED ORDER — OMEPRAZOLE 40 MG PO CPDR: 40.0000 mg | DELAYED_RELEASE_CAPSULE | Freq: Two times a day (BID) | ORAL | Status: DC

## 2013-01-24 NOTE — ED Provider Notes (Signed)
Medical screening examination/treatment/procedure(s) were conducted as a shared visit with non-physician practitioner(s) and myself.  I personally evaluated the patient during the encounter.  Pt presents w/ painless rectal bleeding. VSS, pt in NAD.  Abdominal exam benign. Hb stable.  CT c/w bleeding diverticula.  Pt safe for d/c w/ return precautions for new or worsening symptoms. He can f/u with PCP and GI.  1. GI bleed      EKG Interpretation   None         Shanna Cisco, MD 01/24/13 1541

## 2013-01-24 NOTE — Patient Instructions (Signed)

## 2013-01-24 NOTE — Telephone Encounter (Signed)
Pt was seen [place] for GI bleeding (? Diverticulitis) on 01/23/13; pt scheduled appt with Nicki Reaper NP this today at 10:15 for f/u ED visit and referral to GI doctor. Pt said has not seen bright red blood but dark tarry stools. No N or V and no abd pain.

## 2013-01-24 NOTE — Progress Notes (Signed)
HPI: Pt presents to office today for ED follow up regarding GI bleed. Pt was evaluated on 11/2 at [facility] ED for episodes of black tarry stools. Pt has history diverticulitis and was concerned about possible flare. CT Scan revealed severe diverticulosis, without evidence of acute diverticulitis, but could not rule out a diverticular bleed. Pt endorses one black tarry stool since hospital visit. Pt denies abdominal pain, bright red blood per rectum, diarrhea, fever, or chills. Pt did stop taking his ASA when stools started. Pt denies taking anything OTC and was not prescribed anything per ED MD.  Past Medical History  Diagnosis Date  . Coronary atherosclerosis of native coronary artery   . Acute sinusitis, unspecified   . 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 Unspecified vitamin D deficiency   . Dysfunction of eustachian tube   . Abnormality of gait   . Aortic valve disorders   . Sinoatrial node dysfunction   . Lipoprotein deficiencies   . Other primary cardiomyopathies   . Unspecified essential hypertension   . Other and unspecified hyperlipidemia   . Displacement of lumbar intervertebral disc without myelopathy   . Unspecified disorder of skin and subcutaneous tissue   . Benign paroxysmal positional vertigo   . Headache(784.0)   . Hypertrophy of prostate without urinary obstruction and other lower urinary tract symptoms (LUTS)   . Pain in joint, lower leg   . Pes anserinus tendinitis or bursitis   . Thoracic or lumbosacral neuritis or radiculitis, unspecified   . Personal history of other diseases of digestive system   . Personal history of colonic polyps   . Diverticulosis of colon (without mention of hemorrhage)     Current Outpatient Prescriptions  Medication Sig Dispense Refill  . aspirin 81 MG tablet Take 81 mg by mouth daily.        . CRESTOR 10 MG tablet TAKE ONE TABLET BY MOUTH ONE TIME DAILY  90 tablet  3  . finasteride (PROSCAR) 5 MG tablet TAKE ONE TABLET BY MOUTH  ONE TIME DAILY   30 tablet  6  . furosemide (LASIX) 20 MG tablet Take 1 tablet (20 mg total) by mouth daily.  30 tablet  6  . Multiple Vitamins-Minerals (OCUVITE PRESERVISION PO) Take by mouth daily.      [name] omeprazole (PRILOSEC) 40 MG capsule Take 40 mg by mouth daily.      . ramipril (ALTACE) 2.5 MG capsule Take one capsule by mouth one time daily  30 capsule  2  . terazosin (HYTRIN) 10 MG capsule Take one capsule by mouth one time daily  90 capsule  3  . VOLTAREN 1 % GEL APPLY 2GM TO AFFECTED AREA(S) FOUR TIMES A DAY AS NEEDED   100 g  0   No current facility-administered medications for this visit.    Allergies  Allergen Reactions  . Codeine     REACTION: nausea  . Sulfonamide Derivatives     REACTION: hives     ROS:  Constitutional: Denies fever, malaise, fatigue, headache or abrupt weight changes.   Gastrointestinal:Endorses dark tarry stool.  Denies abdominal pain, bloating, constipation, or diarrhea  Neurological: Denies dizziness, difficulty with memory, difficulty with speech or problems with balance and coordination.   No other specific complaints in a complete review of systems (except as listed in HPI above).  PE:  BP 104/64  Pulse 64  Temp(Src) 97.9 F (36.6 C) (Oral)  Ht 6\' 2"  (1.88 m)  Wt 214 lb 12 oz (  97.41 kg)  BMI 27.56 kg/m2 Wt Readings from Last 3 Encounters:  01/24/13 214 lb 12 oz (97.41 kg)  01/23/13 212 lb 1.6 oz (96.208 kg)  01/06/13 219 lb 6.4 oz (99.519 kg)    General: Appears their stated age, well developed, well nourished in NAD. Cardiovascular: Normal rate and rhythm. S1,S2 noted.  No murmur, rubs or gallops noted. No JVD or BLE edema. No carotid bruits noted. Pulmonary/Chest: Normal effort and positive vesicular breath sounds. No respiratory distress. No wheezes, rales or ronchi noted.  Abdomen: Soft and nontender. Normal bowel sounds, no bruits noted. No distention or masses noted. Liver, spleen and kidneys non palpable. Psychiatric: Mood  and affect normal. Behavior is normal. Judgment and thought content normal.    Assessment and Plan: Black tarry stools/Diverticulosis Increase Prilosec 40mg  to one tablet twice daily Stop ASA 81mg  for 3 days Prescribed Carafate 1gram by mouth TID for 2 weeks GI Referral? If symptoms persist Follow up in 5-7 days if symptoms worsen or do not improve  Dewarren Ledbetter S, Student-NP

## 2013-01-24 NOTE — Telephone Encounter (Signed)
Noted, will discuss at OV

## 2013-01-24 NOTE — Telephone Encounter (Signed)
[facility] Triage Call Report Triage Record Num: [id number] Operator: Tomasita Crumble Patient Name: Dominic Kane Call Date & Time: 01/23/2013 3:08:08PM Patient Phone: [phone] PCP: Patient Gender: Male PCP Fax : Patient DOB: Aug 13, 1940 Practice Name: [facility] Reason for Call: Caller: Gradyn/Patient; PCP: Kerby Nora (Family Practice); CB#: [phone]; Call regarding Rectal bleeding; onset 01/21/13. Blood with black/ tarry stools (estimated 6-7 stools). Emergent symptoms ruled out. See provider within 4 hours per Gastrointestinal Bleeding protocol due to One or more episodes of rectal bleeding more than scant and no symptoms of hypovolemia. Advised ED per nursing judgment. Caller agreed to go to [facility] ED for evaluation. Protocol(s) Used: Gastrointestinal Bleeding Recommended Outcome per Protocol: See Provider within 4 hours Reason for Outcome: One or more episodes of rectal bleeding (more than scant) and no symptoms of hypovolemia Care Advice: ~ List, or take, all current prescription(s), nonprescription or alternative medication(s) to provider for evaluation. 01/23/2013 3:16:54PM Page 1 of 1 [email or web address]

## 2013-01-24 NOTE — Progress Notes (Signed)
Subjective:    Patient ID: Dominic Kane, male    DOB: [birth year], 72 y.o.   MRN: [id number]  HPI  Pt presents to the clinic today for ER follow up. He was seen at [facility] ER on 11/2 for rectal bleeding. He did notice some BRBPR. He has noticed this before when he had a diverticulitis flare. He denies abdominal pain. CT scan of the abdomen was done, see results below. After leaving the hospital, he has noticed some black, tarry looking stools. He denies dizziness, lightheadedness, chest pain or shortness of breath.  IMPRESSION:  1. Severe diverticulosis throughout the colon, sparing only the  distal sigmoid and rectum. No definite acute diverticulitis. If GI  bleeding is suspected, consider bleeding diverticular disease.  2. No bowel obstruction or definite focal inflammatory process in  the abdomen or pelvis.  3. Chronic findings, including L5-S1 spondylolisthesis,  spondylolysis, and associated spinal degeneration.  Electronically Signed  By: Augusto Gamble M.D.  On: 01/23/2013 22:26  Review of Systems      Past Medical History  Diagnosis Date  . Coronary atherosclerosis of native coronary artery   . Acute sinusitis, unspecified   . 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 Unspecified vitamin D deficiency   . Dysfunction of eustachian tube   . Abnormality of gait   . Aortic valve disorders   . Sinoatrial node dysfunction   . Lipoprotein deficiencies   . Other primary cardiomyopathies   . Unspecified essential hypertension   . Other and unspecified hyperlipidemia   . Displacement of lumbar intervertebral disc without myelopathy   . Unspecified disorder of skin and subcutaneous tissue   . Benign paroxysmal positional vertigo   . Headache(784.0)   . Hypertrophy of prostate without urinary obstruction and other lower urinary tract symptoms (LUTS)   . Pain in joint, lower leg   . Pes anserinus tendinitis or bursitis   . Thoracic or lumbosacral neuritis or radiculitis,  unspecified   . Personal history of other diseases of digestive system   . Personal history of colonic polyps   . Diverticulosis of colon (without mention of hemorrhage)     Current Outpatient Prescriptions  Medication Sig Dispense Refill  . aspirin 81 MG tablet Take 81 mg by mouth daily.        . CRESTOR 10 MG tablet TAKE ONE TABLET BY MOUTH ONE TIME DAILY  90 tablet  3  . finasteride (PROSCAR) 5 MG tablet TAKE ONE TABLET BY MOUTH ONE TIME DAILY   30 tablet  6  . furosemide (LASIX) 20 MG tablet Take 1 tablet (20 mg total) by mouth daily.  30 tablet  6  . Multiple Vitamins-Minerals (OCUVITE PRESERVISION PO) Take by mouth daily.      [name] omeprazole (PRILOSEC) 40 MG capsule Take 40 mg by mouth daily.      . ramipril (ALTACE) 2.5 MG capsule Take one capsule by mouth one time daily  30 capsule  2  . terazosin (HYTRIN) 10 MG capsule Take one capsule by mouth one time daily  90 capsule  3  . VOLTAREN 1 % GEL APPLY 2GM TO AFFECTED AREA(S) FOUR TIMES A DAY AS NEEDED   100 g  0   No current facility-administered medications for this visit.    Allergies  Allergen Reactions  . Codeine     REACTION: nausea  . Sulfonamide Derivatives     REACTION: hives    Family History  Problem Relation Age of Onset  .  Heart failure Mother   . Coronary artery disease Mother   . Stroke Mother   . Cancer Father     LUNG  . Cancer Sister     BREAST  . Heart failure Maternal Grandmother     History   Social History  . Marital Status: Married    Spouse Name: N/A    Number of Children: 1  . Years of Education: N/A   Occupational History  . retired Museum/gallery curator    Social History Main Topics  . Smoking status: Former Smoker    Quit date: 07/03/1980  . Smokeless tobacco: Never Used  . Alcohol Use: Yes  . Drug Use: No  . Sexual Activity: Not on file   Other Topics Concern  . Not on file   Social History Narrative   REGULAR EXERCISE-Yes, bicycle 4 times per week      Diet: fruit and  veggies, 40lb weight loss in 2 years     Constitutional: Denies fever, malaise, fatigue, headache or abrupt weight changes.  Respiratory: Denies difficulty breathing, shortness of breath, cough or sputum production.   Cardiovascular: Denies chest pain, chest tightness, palpitations or swelling in the hands or feet.  Gastrointestinal: Denies abdominal pain, bloating, constipation, diarrhea.  Neurological: Denies dizziness, difficulty with memory, difficulty with speech or problems with balance and coordination.   No other specific complaints in a complete review of systems (except as listed in HPI above).  Objective:   Physical Exam   BP 104/64  Pulse 64  Temp(Src) 97.9 F (36.6 C) (Oral)  Ht 6\' 2"  (1.88 m)  Wt 214 lb 12 oz (97.41 kg)  BMI 27.56 kg/m2 Wt Readings from Last 3 Encounters:  01/24/13 214 lb 12 oz (97.41 kg)  01/23/13 212 lb 1.6 oz (96.208 kg)  01/06/13 219 lb 6.4 oz (99.519 kg)    General: Appears his stated age, well developed, well nourished in NAD.  Cardiovascular: Normal rate and rhythm. S1,S2 noted.  No murmur, rubs or gallops noted. No JVD or BLE edema. No carotid bruits noted. Pulmonary/Chest: Normal effort and positive vesicular breath sounds. No respiratory distress. No wheezes, rales or ronchi noted.  Abdomen: Soft and nontender. Normal bowel sounds, no bruits noted. No distention or masses noted. Liver, spleen and kidneys non palpable. Neurological: Alert and oriented. Cranial nerves II-XII intact. Coordination normal. +DTRs bilaterally.     BMET    Component Value Date/Time   NA 138 01/23/2013 1642   K 3.9 01/23/2013 1642   CL 104 01/23/2013 1642   CO2 25 01/23/2013 1642   GLUCOSE 113* 01/23/2013 1642   BUN 26* 01/23/2013 1642   CREATININE 1.09 01/23/2013 1642   CALCIUM 9.2 01/23/2013 1642   GFRNONAA 66* 01/23/2013 1642   GFRAA 76* 01/23/2013 1642    Lipid Panel     Component Value Date/Time   CHOL 125 05/24/2012 0818   TRIG 106.0 05/24/2012 0818    HDL 33.50* 05/24/2012 0818   CHOLHDL 4 05/24/2012 0818   VLDL 21.2 05/24/2012 0818   LDLCALC 70 05/24/2012 0818    CBC    Component Value Date/Time   WBC 8.9 01/23/2013 1642   RBC 4.15* 01/23/2013 1642   HGB 12.9* 01/23/2013 1642   HCT 37.5* 01/23/2013 1642   PLT 182 01/23/2013 1642   MCV 90.4 01/23/2013 1642   MCH 31.1 01/23/2013 1642   MCHC 34.4 01/23/2013 1642   RDW 13.1 01/23/2013 1642   LYMPHSABS 1.9 05/01/2011 1117   MONOABS 0.7 05/01/2011  1117   EOSABS 0.3 05/01/2011 1117   BASOSABS 0.1 05/01/2011 1117    Hgb A1C No results found for this basename: HGBA1C        Assessment & Plan:   Black tarry stools secondary to sever diverticular bleed:  He is due for his colonoscopy, he will contact his GI doctor Ok to take prilosec BID x 2 weeks then return to daily thereafter eRx for Carafate 1 gm TID for the next 2 weeks Hold aspirin for the next 3-5 days No evidence of infection on CT Scan so no indication for abx Continue to monitor for BRBPR Drink plenty of fluids to avoid dehydration  RTC as needed or if bleeding continues or worsens

## 2013-02-01 ENCOUNTER — Encounter: Payer: Self-pay | Admitting: Cardiovascular Disease

## 2013-02-08 ENCOUNTER — Ambulatory Visit (INDEPENDENT_AMBULATORY_CARE_PROVIDER_SITE_OTHER): Payer: Medicare Other | Admitting: Family Medicine

## 2013-02-08 ENCOUNTER — Ambulatory Visit (INDEPENDENT_AMBULATORY_CARE_PROVIDER_SITE_OTHER)
Admission: RE | Admit: 2013-02-08 | Discharge: 2013-02-08 | Disposition: A | Discharge status: Home / Self Care | Payer: Medicare Other | Source: Ambulatory Visit | Attending: Family Medicine | Admitting: Family Medicine

## 2013-02-08 ENCOUNTER — Encounter: Payer: Self-pay | Admitting: Family Medicine

## 2013-02-08 ENCOUNTER — Encounter: Payer: Self-pay | Admitting: Cardiovascular Disease

## 2013-02-08 VITALS — BP 118/68 | HR 88 | Temp 99.7°F | Ht 74.0 in | Wt 217.5 lb

## 2013-02-08 DIAGNOSIS — R059 Cough, unspecified: Secondary | ICD-10-CM

## 2013-02-08 DIAGNOSIS — J209 Acute bronchitis, unspecified: Secondary | ICD-10-CM

## 2013-02-08 DIAGNOSIS — R05 Cough: Secondary | ICD-10-CM

## 2013-02-08 DIAGNOSIS — I10 Essential (primary) hypertension: Secondary | ICD-10-CM | POA: Diagnosis not present

## 2013-02-08 MED ORDER — LEVOFLOXACIN 500 MG PO TABS: 500.0000 mg | ORAL_TABLET | Freq: Every day | ORAL | Status: DC

## 2013-02-08 MED ORDER — HYDROCODONE-HOMATROPINE 5-1.5 MG/5ML PO SYRP: 5.0000 mL | ORAL_SOLUTION | Freq: Four times a day (QID) | ORAL | Status: DC | PRN

## 2013-02-08 NOTE — Progress Notes (Signed)
Pre-visit discussion using our clinic review tool. No additional management support is needed unless otherwise documented below in the visit note.  

## 2013-02-08 NOTE — Patient Instructions (Addendum)
Start levaquin x 7 days. We will call with radiologist reading of X-ray. Hold off on MRI until next week. Call if not significant improvement in 48 hours.  Tussionex for cough.

## 2013-02-08 NOTE — Assessment & Plan Note (Signed)
In smoker.. No formal dx of COPD, but likley.  Start antibiotics... Send CXR for review by radiologist ( no clear PNA seen). If SOB not improving consider albuterol and steroid course.

## 2013-02-08 NOTE — Progress Notes (Signed)
  Subjective:    Patient ID: Dominic Kane, male    DOB: 1940-10-31, 72 y.o.   MRN: [id number]  Cough This is a new problem. The current episode started 1 to 4 weeks ago. The cough is productive of purulent sputum and productive of blood-tinged sputum. Associated symptoms include chills, a fever, nasal congestion and shortness of breath. Pertinent negatives include no ear congestion, postnasal drip, rash or wheezing. Associated symptoms comments: Chest congestion. The symptoms are aggravated by lying down. Treatments tried: coricidin. The treatment provided mild relief. There is no history of asthma, bronchiectasis, bronchitis, COPD, emphysema, environmental allergies or pneumonia. former smoker > 25 pack year history    Currently eval pending for heart...  Primary cardiomyopathy. Has MRI scheduled tommorow.  Review of Systems  Constitutional: Positive for fever and chills.  HENT: Negative for postnasal drip.   Respiratory: Positive for cough and shortness of breath. Negative for wheezing.   Skin: Negative for rash.  Allergic/Immunologic: Negative for environmental allergies.       Objective:   Physical Exam        Assessment & Plan:

## 2013-02-09 ENCOUNTER — Ambulatory Visit (HOSPITAL_COMMUNITY): Admission: RE | Admit: 2013-02-09 | Payer: Medicare Other | Source: Ambulatory Visit

## 2013-02-11 ENCOUNTER — Other Ambulatory Visit: Payer: Self-pay | Admitting: Family Medicine

## 2013-02-19 ENCOUNTER — Other Ambulatory Visit: Payer: Self-pay | Admitting: Internal Medicine

## 2013-02-21 ENCOUNTER — Ambulatory Visit (HOSPITAL_COMMUNITY): Payer: Medicare Other

## 2013-02-21 ENCOUNTER — Ambulatory Visit (HOSPITAL_COMMUNITY)
Admission: RE | Admit: 2013-02-21 | Discharge: 2013-02-21 | Disposition: A | Discharge status: Home / Self Care | Payer: Medicare Other | Source: Ambulatory Visit | Attending: Cardiovascular Disease | Admitting: Cardiovascular Disease

## 2013-02-21 DIAGNOSIS — I712 Thoracic aortic aneurysm, without rupture, unspecified: Secondary | ICD-10-CM | POA: Diagnosis not present

## 2013-02-21 DIAGNOSIS — I428 Other cardiomyopathies: Secondary | ICD-10-CM | POA: Insufficient documentation

## 2013-02-21 DIAGNOSIS — I2510 Atherosclerotic heart disease of native coronary artery without angina pectoris: Secondary | ICD-10-CM

## 2013-02-21 MED ORDER — GADOBENATE DIMEGLUMINE 529 MG/ML IV SOLN
33.0000 mL | Freq: Once | INTRAVENOUS | Status: AC | PRN
  Administered 2013-02-21: 33 mL via INTRAVENOUS

## 2013-02-22 ENCOUNTER — Telehealth: Payer: Self-pay | Admitting: Interventional Cardiology

## 2013-02-22 ENCOUNTER — Telehealth: Payer: Self-pay | Admitting: Cardiovascular Disease

## 2013-02-22 NOTE — Telephone Encounter (Signed)
Follow Up ° °Pt returned call//  °

## 2013-02-22 NOTE — Telephone Encounter (Signed)
Error

## 2013-02-22 NOTE — Telephone Encounter (Signed)
Spoke with pt and reviewed MRI results with him.

## 2013-03-22 ENCOUNTER — Other Ambulatory Visit: Payer: Self-pay | Admitting: Internal Medicine

## 2013-03-29 ENCOUNTER — Other Ambulatory Visit: Payer: Self-pay | Admitting: Cardiology

## 2013-04-01 ENCOUNTER — Encounter: Payer: Self-pay | Admitting: Family Medicine

## 2013-04-05 ENCOUNTER — Encounter: Payer: Self-pay | Admitting: Cardiovascular Disease

## 2013-04-05 ENCOUNTER — Ambulatory Visit (INDEPENDENT_AMBULATORY_CARE_PROVIDER_SITE_OTHER): Payer: Medicare Other | Admitting: Cardiovascular Disease

## 2013-04-05 VITALS — BP 136/90 | HR 68 | Ht 74.0 in | Wt 223.0 lb

## 2013-04-05 DIAGNOSIS — I428 Other cardiomyopathies: Secondary | ICD-10-CM

## 2013-04-05 DIAGNOSIS — I712 Thoracic aortic aneurysm, without rupture, unspecified: Secondary | ICD-10-CM | POA: Diagnosis not present

## 2013-04-05 DIAGNOSIS — I5022 Chronic systolic (congestive) heart failure: Secondary | ICD-10-CM

## 2013-04-05 DIAGNOSIS — E785 Hyperlipidemia, unspecified: Secondary | ICD-10-CM

## 2013-04-05 DIAGNOSIS — I2510 Atherosclerotic heart disease of native coronary artery without angina pectoris: Secondary | ICD-10-CM

## 2013-04-05 DIAGNOSIS — I509 Heart failure, unspecified: Secondary | ICD-10-CM

## 2013-04-05 MED ORDER — FUROSEMIDE 40 MG PO TABS: 40.0000 mg | ORAL_TABLET | Freq: Every day | ORAL | Status: DC

## 2013-04-05 NOTE — Progress Notes (Signed)
History of Present Illness: Dominic Kane is a pleasant 73 year-old Caucasian male with a past medical history significant for diastolic CHF, hypertension, nonobstructive coronary artery disease, idiopathic cardiomyopathy, benign prostatic hypertrophy, hyperlipidemia and hiatal hernia, who presents today for routine cardiac followup. Dominic Kane had an episode of chest pain about 7 years ago; at which time, he was evaluated by the [facility] in [place], [place]. He was apparently known to have an ejection fraction of 35-40%, which was felt to be idiopathic. Left heart catheterization was performed in May 2005 and showed diffuse 15% irregularities in the LAD, diffuse 20% irregularities in the ramus intermedius branch, diffuse 30% irregularities in the nondominant circumflex, and a 50% stenosis in the proximal RCA with irregularities in the midportion of the vessel. The ascending aorta was mildly dilated with no evidence of dissection during that catheterization. Ejection fraction was noted to be 65%. An echocardiogram during that admission in May 2005 in [place] showed an ejection fraction of 55-60% with the ascending aorta at the sinotubular junction measuring 4.1 cm. LVEF has been noted to be around 45% on studies in 2010, 2011. Echo September 2013 suggested LVEF was around 30%. Cardiac MRI December 2014 showed mild enlargement of the aortic root (4.2 cm) and and EF of 44%. Last stress test 9/13 without ischemia. Recent pneumonia and ulcer but recovering.   He is here today for follow up. He is describes dyspnea. Worsened with bending over.  This has been present for several months. No worsening. No awareness of palpitations, near syncope, syncope, orthopnea, PND, LE edema. He is very active. Weight up by 5 lbs last year.   Primary Care Physician: Eliezer Lofts  Last Lipid Profile:Lipid Panel     Component Value Date/Time   CHOL 125 05/24/2012 0818   TRIG 106.0 05/24/2012 0818   HDL 33.50*  05/24/2012 0818   CHOLHDL 4 05/24/2012 0818   VLDL 21.2 05/24/2012 0818   LDLCALC 70 05/24/2012 0818     Past Medical History  Diagnosis Date  . Coronary atherosclerosis of native coronary artery   . Acute sinusitis, unspecified   . 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 Unspecified vitamin D deficiency   . Dysfunction of eustachian tube   . Abnormality of gait   . Aortic valve disorders   . Sinoatrial node dysfunction   . Lipoprotein deficiencies   . Other primary cardiomyopathies   . Unspecified essential hypertension   . Other and unspecified hyperlipidemia   . Displacement of lumbar intervertebral disc without myelopathy   . Unspecified disorder of skin and subcutaneous tissue   . Benign paroxysmal positional vertigo   . Headache(784.0)   . Hypertrophy of prostate without urinary obstruction and other lower urinary tract symptoms (LUTS)   . Pain in joint, lower leg   . Pes anserinus tendinitis or bursitis   . Thoracic or lumbosacral neuritis or radiculitis, unspecified   . Personal history of other diseases of digestive system   . Personal history of colonic polyps   . Diverticulosis of colon (without mention of hemorrhage)     Past Surgical History  Procedure Laterality Date  . Knee surgery  1947  . Esophagogastroduodenoscopy  11/07    Schatzki's ring, non bleeding erosive gastropathy [place]  . Dobutamine stress echo  1/07    Lateral hypokinesis but no ischemia  . Pulmonary functioning tests  1. 2003  2. 2005    1. Diminished lung capacity  2. Stable  Current Outpatient Prescriptions  Medication Sig Dispense Refill  . aspirin 81 MG tablet Take 81 mg by mouth daily.        . CRESTOR 10 MG tablet TAKE ONE TABLET BY MOUTH ONE TIME DAILY  90 tablet  3  . finasteride (PROSCAR) 5 MG tablet TAKE ONE TABLET BY MOUTH ONE TIME DAILY   30 tablet  6  . furosemide (LASIX) 20 MG tablet Take 1 tablet (20 mg total) by mouth daily.  30 tablet  6  . levofloxacin  (LEVAQUIN) 500 MG tablet Take 1 tablet (500 mg total) by mouth daily.  7 tablet  0  . Multiple Vitamins-Minerals (OCUVITE PRESERVISION PO) Take by mouth daily.      [name] omeprazole (PRILOSEC) 40 MG capsule Take 1 capsule (40 mg total) by mouth daily.  30 capsule  0  . ramipril (ALTACE) 2.5 MG capsule TAKE ONE CAPSULE BY MOUTH ONE TIME DAILY   30 capsule  0  . terazosin (HYTRIN) 10 MG capsule Take one capsule by mouth one time daily  90 capsule  3  . VOLTAREN 1 % GEL APPLY 2GM TO AFFECTED AREA(S) FOUR TIMES A DAY AS NEEDED   100 g  0   No current facility-administered medications for this visit.    Allergies  Allergen Reactions  . Codeine     REACTION: nausea  . Sulfonamide Derivatives     REACTION: hives    History   Social History  . Marital Status: Married    Spouse Name: N/A    Number of Children: 1  . Years of Education: N/A   Occupational History  . retired Teacher, English as a foreign language    Social History Main Topics  . Smoking status: Former Smoker    Quit date: 07/03/1980  . Smokeless tobacco: Never Used  . Alcohol Use: Yes  . Drug Use: No  . Sexual Activity: Not on file   Other Topics Concern  . Not on file   Social History Narrative   REGULAR EXERCISE-Yes, bicycle 4 times per week      Diet: fruit and veggies, 40lb weight loss in 2 years    Family History  Problem Relation Age of Onset  . Heart failure Mother   . Coronary artery disease Mother   . Stroke Mother   . Cancer Father     LUNG  . Cancer Sister     BREAST  . Heart failure Maternal Grandmother     Review of Systems:  As stated in the HPI and otherwise negative.   BP 136/90  Pulse 68  Ht 6\' 2"  (1.88 m)  Wt 223 lb (101.152 kg)  BMI 28.62 kg/m2  SpO2 97%  Physical Examination: General: Well developed, well nourished, NAD HEENT: OP clear, mucus membranes moist SKIN: warm, dry. No rashes. Neuro: No focal deficits Musculoskeletal: Muscle strength 5/5 all ext Psychiatric: Mood and affect normal Neck:  No JVD, no carotid bruits, no thyromegaly, no lymphadenopathy. Lungs:Clear bilaterally, no wheezes, rhonci, crackles Cardiovascular: Regular rate and rhythm. No murmurs, gallops or rubs. Abdomen:Soft. Bowel sounds present. Non-tender.  Extremities: No lower extremity edema. Pulses are 2 + in the bilateral DP/PT.  Echo 12/10/11: Left ventricle: The cavity size was normal. Wall thickness was increased in a pattern of mild LVH. The estimated ejection fraction was 30%. Diffuse hypokinesis. Doppler parameters are consistent with abnormal left ventricular relaxation (grade 1 diastolic dysfunction). - Aortic valve: There was no stenosis. Trivial regurgitation. - Aorta: Ascending aorta dimension: 43 mm. Mildly  dilated aortic root and ascending aorta. Aortic root dimension:59mm (ED). - Mitral valve: Trivial regurgitation. - Left atrium: The atrium was mildly dilated. - Right ventricle: The cavity size was normal. Systolic function was mildly to moderately reduced. - Tricuspid valve: Peak RV-RA gradient: 69mm Hg (S). - Pulmonary arteries: PA peak pressure: 30mm Hg (S). - Systemic veins: IVC measured 1.9 cm with some respirophasic variation, suggesting RA pressure 10 mmHg. Impressions:  - Normal LV size with mild LV hypertrophy. EF 30% with diffuse hypokinesis. Normal RV size with mild to moderate hypokinesis. No significant valvular abnormalities. Dilation of the aortic root and ascending aorta. Would consider MRA of the chest to further delineate the thoracic aorta.  Cardiac MRI 02/22/13: On limited views of the lung fields, there were no gross abnormalities. Normal left ventricular size with normal wall thickness. There was global hypokinesis with EF calculated to be 44%. The right ventricle was normal in size with mild systolic dysfunction. The left atrium was mildly dilated. The right atrium was normal in size. The aortic valve was trileaflet. It opened well, I do not think that  there was significant aortic stenosis. There was trivial aortic insufficiency. Significant mitral regurgitation was not noted.  On delayed enhancement imaging, there was no myocardial delayed enhancement.  On MRA of the chest, the aortic root and ascending aorta were mildly dilated. The arch and descending aorta were normal in caliber. The arch vessels originated normally. The pulmonary veins drained normally to the left atrium.  Measurements:  LV EDV 146 mL  LV SV 64 mL  LV EF 44%  Aortic root 4.2 cm  Ascending aorta 4.1 cm (maximal diameter)  Arch 3.3 cm  Descending thoracic aorta 3.0 cm  IMPRESSION: 1. Normal LV size with mild global hypokinesis, EF 44%. There was no myocardial delayed enhancement so no definitive evidence for prior myocardial infarction, infiltrative disease, or myocarditis.  2. Normal RV size with mildly decreased systolic function.  3. Mild aortic root (4.2 cm) and ascending thoracic aorta (4.1 cm) dilation.  Assessment and Plan:   1. CAD: No chest pains. Appears stable. Normal stress test September 2013. He is known to have non-obstructive CAD by cath 2005. Continue ASA and statin.   2. Non-ischemic Cardiomyopathy: LVEF 44% by cardiac MRI 12/14 Continue Ace-inh. No beta blocker with bradycardia. Weight is up. Will increase Lasix to 40 mg po Qdaily. He will eat several bananas per week for potassium content.     3. Thoracic aortic aneurysm:  Last imaging December 2014 by MRI, stable.     4. HLD: Continue statin.   5. Chronic systolic CHF: As above, will increase Lasix.

## 2013-04-05 NOTE — Patient Instructions (Signed)
Your physician wants you to follow-up in: 6 months.  You will receive a reminder letter in the mail two months in advance. If you don't receive a letter, please call our office to schedule the follow-up appointment.  Your physician has recommended you make the following change in your medication:  Increase furosemide to 40 mg by mouth daily   

## 2013-04-07 ENCOUNTER — Ambulatory Visit: Payer: Medicare Other | Admitting: Cardiovascular Disease

## 2013-04-18 ENCOUNTER — Other Ambulatory Visit: Payer: Self-pay | Admitting: Internal Medicine

## 2013-04-29 ENCOUNTER — Other Ambulatory Visit: Payer: Self-pay

## 2013-04-29 MED ORDER — RAMIPRIL 2.5 MG PO CAPS: ORAL_CAPSULE | ORAL | Status: DC

## 2013-05-24 ENCOUNTER — Other Ambulatory Visit: Payer: Self-pay | Admitting: Cardiovascular Disease

## 2013-06-09 ENCOUNTER — Other Ambulatory Visit: Payer: Self-pay | Admitting: Cardiovascular Disease

## 2013-07-04 ENCOUNTER — Ambulatory Visit (INDEPENDENT_AMBULATORY_CARE_PROVIDER_SITE_OTHER): Payer: Medicare Other | Admitting: Ophthalmology

## 2013-07-04 DIAGNOSIS — H35039 Hypertensive retinopathy, unspecified eye: Secondary | ICD-10-CM

## 2013-07-04 DIAGNOSIS — H3530 Unspecified macular degeneration: Secondary | ICD-10-CM

## 2013-07-04 DIAGNOSIS — I10 Essential (primary) hypertension: Secondary | ICD-10-CM

## 2013-07-04 DIAGNOSIS — H35379 Puckering of macula, unspecified eye: Secondary | ICD-10-CM | POA: Diagnosis not present

## 2013-07-04 DIAGNOSIS — H43819 Vitreous degeneration, unspecified eye: Secondary | ICD-10-CM

## 2013-07-27 ENCOUNTER — Telehealth: Payer: Self-pay | Admitting: *Deleted

## 2013-07-27 ENCOUNTER — Encounter: Payer: Self-pay | Admitting: Family Medicine

## 2013-07-27 ENCOUNTER — Ambulatory Visit (INDEPENDENT_AMBULATORY_CARE_PROVIDER_SITE_OTHER): Payer: Medicare Other | Admitting: Family Medicine

## 2013-07-27 VITALS — BP 112/62 | HR 51 | Temp 97.9°F | Ht 74.0 in | Wt 223.2 lb

## 2013-07-27 DIAGNOSIS — I2510 Atherosclerotic heart disease of native coronary artery without angina pectoris: Secondary | ICD-10-CM | POA: Diagnosis not present

## 2013-07-27 DIAGNOSIS — IMO0002 Reserved for concepts with insufficient information to code with codable children: Secondary | ICD-10-CM

## 2013-07-27 DIAGNOSIS — M1712 Unilateral primary osteoarthritis, left knee: Secondary | ICD-10-CM

## 2013-07-27 DIAGNOSIS — M1710 Unilateral primary osteoarthritis, unspecified knee: Secondary | ICD-10-CM | POA: Diagnosis not present

## 2013-07-27 MED ORDER — TRAMADOL HCL 50 MG PO TABS: 50.0000 mg | ORAL_TABLET | Freq: Three times a day (TID) | ORAL | Status: DC | PRN

## 2013-07-27 MED ORDER — DICLOFENAC SODIUM 1 % TD GEL: TRANSDERMAL | Status: DC

## 2013-07-27 MED ORDER — MELOXICAM 15 MG PO TABS: 15.0000 mg | ORAL_TABLET | Freq: Every day | ORAL | Status: DC

## 2013-07-27 MED ORDER — CYCLOBENZAPRINE HCL 10 MG PO TABS: 10.0000 mg | ORAL_TABLET | Freq: Every evening | ORAL | Status: DC | PRN

## 2013-07-27 NOTE — Assessment & Plan Note (Signed)
NSAIDs, heat, muscle relaxant, start home PT. If not improving follow up in 2 weeks for X-ray and possible referral to PT.

## 2013-07-27 NOTE — Assessment & Plan Note (Signed)
Refilled voltaren gel for knee to use prn.

## 2013-07-27 NOTE — Patient Instructions (Signed)
Start meloxicam for anti-inflammatory, can use muscle relaxant at night for spasm. Tramadol for breakthrough pain.  Follow up if not improving in 2 weeks, or sooner if severe pain or new weakness.   Sciatica Sciatica is pain, weakness, numbness, or tingling along the path of the sciatic nerve. The nerve starts in the lower back and runs down the back of each leg. The nerve controls the muscles in the lower leg and in the back of the knee, while also providing sensation to the back of the thigh, lower leg, and the sole of your foot. Sciatica is a symptom of another medical condition. For instance, nerve damage or certain conditions, such as a herniated disk or bone spur on the spine, pinch or put pressure on the sciatic nerve. This causes the pain, weakness, or other sensations normally associated with sciatica. Generally, sciatica only affects one side of the body. CAUSES   Herniated or slipped disc.  Degenerative disk disease.  A pain disorder involving the narrow muscle in the buttocks (piriformis syndrome).  Pelvic injury or fracture.  Pregnancy.  Tumor (rare). SYMPTOMS  Symptoms can vary from mild to very severe. The symptoms usually travel from the low back to the buttocks and down the back of the leg. Symptoms can include:  Mild tingling or dull aches in the lower back, leg, or hip.  Numbness in the back of the calf or sole of the foot.  Burning sensations in the lower back, leg, or hip.  Sharp pains in the lower back, leg, or hip.  Leg weakness.  Severe back pain inhibiting movement. These symptoms may get worse with coughing, sneezing, laughing, or prolonged sitting or standing. Also, being overweight may worsen symptoms. DIAGNOSIS  Your caregiver will perform a physical exam to look for common symptoms of sciatica. He or she may ask you to do certain movements or activities that would trigger sciatic nerve pain. Other tests may be performed to find the cause of the  sciatica. These may include:  Blood tests.  X-rays.  Imaging tests, such as an MRI or CT scan. TREATMENT  Treatment is directed at the cause of the sciatic pain. Sometimes, treatment is not necessary and the pain and discomfort goes away on its own. If treatment is needed, your caregiver may suggest:  Over-the-counter medicines to relieve pain.  Prescription medicines, such as anti-inflammatory medicine, muscle relaxants, or narcotics.  Applying heat or ice to the painful area.  Steroid injections to lessen pain, irritation, and inflammation around the nerve.  Reducing activity during periods of pain.  Exercising and stretching to strengthen your abdomen and improve flexibility of your spine. Your caregiver may suggest losing weight if the extra weight makes the back pain worse.  Physical therapy.  Surgery to eliminate what is pressing or pinching the nerve, such as a bone spur or part of a herniated disk. HOME CARE INSTRUCTIONS   Only take over-the-counter or prescription medicines for pain or discomfort as directed by your caregiver.  Apply ice to the affected area for 20 minutes, 3 4 times a day for the first 48 72 hours. Then try heat in the same way.  Exercise, stretch, or perform your usual activities if these do not aggravate your pain.  Attend physical therapy sessions as directed by your caregiver.  Keep all follow-up appointments as directed by your caregiver.  Do not wear high heels or shoes that do not provide proper support.  Check your mattress to see if it is too  soft. A firm mattress may lessen your pain and discomfort. SEEK IMMEDIATE MEDICAL CARE IF:   You lose control of your bowel or bladder (incontinence).  You have increasing weakness in the lower back, pelvis, buttocks, or legs.  You have redness or swelling of your back.  You have a burning sensation when you urinate.  You have pain that gets worse when you lie down or awakens you at  night.  Your pain is worse than you have experienced in the past.  Your pain is lasting longer than 4 weeks.  You are suddenly losing weight without reason. MAKE SURE YOU:  Understand these instructions.  Will watch your condition.  Will get help right away if you are not doing well or get worse. Document Released: 03/04/2001 Document Revised: 09/09/2011 Document Reviewed: 07/20/2011 [facility] Patient Information 2014 [place].

## 2013-07-27 NOTE — Progress Notes (Signed)
   Subjective:    Patient ID: Dominic Kane, male    DOB: 02/11/1941, 73 y.o.   MRN: [id number]  HPI  73 year old male with history of lumbar back pain with radiculopathy presents with recurrence of pain in last week.  Pain is in left lower back and runs down to left toes. intermittent numbness. No weakness, no fever.  No incontinence of urine. No recent falls. No known injury, no change in activity. Most pain with bending and moving/ walking.  No past X-ray of back. No past surgeries.  Used some tramadol which helped some with pain.  Request voltaren gel for left knee OA. Needs refills.  Review of Systems  Constitutional: Negative for fever and fatigue.  HENT: Negative for ear pain.   Eyes: Negative for pain.  Respiratory: Negative for cough and shortness of breath.   Cardiovascular: Negative for chest pain.  Gastrointestinal: Negative for abdominal pain.       Objective:   Physical Exam  Constitutional: Vital signs are normal. He appears well-developed and well-nourished.  HENT:  Head: Normocephalic.  Right Ear: Hearing normal.  Left Ear: Hearing normal.  Nose: Nose normal.  Mouth/Throat: Oropharynx is clear and moist and mucous membranes are normal.  Neck: Trachea normal. Carotid bruit is not present. No mass and no thyromegaly present.  Cardiovascular: Normal rate, regular rhythm and normal pulses.  Exam reveals no gallop, no distant heart sounds and no friction rub.   Murmur heard. No peripheral edema  Pulmonary/Chest: Effort normal and breath sounds normal. No respiratory distress.  Musculoskeletal:       Thoracic back: Normal.       Lumbar back: He exhibits decreased range of motion, tenderness, bony tenderness, pain and spasm. He exhibits no edema, no deformity and no laceration.  Strength 5/5 B lower ext  mildly positive SLR on left, neg faber's for anterior hip pain but caused low back pain  Skin: Skin is warm, dry and intact. No rash noted.  Psychiatric:  He has a normal mood and affect. His speech is normal and behavior is normal. Thought content normal.          Assessment & Plan:

## 2013-07-27 NOTE — Telephone Encounter (Signed)
Received fax from [facility] requesting prior authorization on Voltaren Gel.  Prior Authorization completed on CoverMyMeds.

## 2013-07-27 NOTE — Progress Notes (Signed)
Pre visit review using our clinic review tool, if applicable. No additional management support is needed unless otherwise documented below in the visit note. 

## 2013-07-29 ENCOUNTER — Other Ambulatory Visit: Payer: Self-pay | Admitting: Family Medicine

## 2013-07-29 NOTE — Telephone Encounter (Signed)
Voltaren Gel 1% approved from 07/27/2013 through 07/28/2014.

## 2013-08-10 ENCOUNTER — Other Ambulatory Visit: Payer: Self-pay | Admitting: Family Medicine

## 2013-08-22 ENCOUNTER — Other Ambulatory Visit: Payer: Self-pay | Admitting: Family Medicine

## 2013-09-21 ENCOUNTER — Other Ambulatory Visit: Payer: Self-pay | Admitting: Cardiovascular Disease

## 2013-10-03 ENCOUNTER — Telehealth: Payer: Self-pay | Admitting: Cardiovascular Disease

## 2013-10-03 NOTE — Telephone Encounter (Signed)
New Message  Pt called states that he had blurriness in his right eye for several hours on 10/02/2013. [name] Pt states that he spoke with his primary care whom advised the pt to discuss this with his cardiologist. Pt requests a call back to discuss.

## 2013-10-03 NOTE — Telephone Encounter (Signed)
I spoke with the patient. He reports that he was working outside on a camper yesterday. This was a reflective surface and he was not wearing sunglasses. He noticed that his right eye started to feel dilated and he felt like he was "looking at a jigsaw puzzle with gray spots." He called his eye doctor due to a previous eye condition and was told to call his cardiologist because his current symptoms were not related to his eye condition. He denies a history of migraines. He did not lose peripheral vision. His symptoms lasted about 3 hours. He did not stay well hydrated yesterday. He had a little burning to the eye and used saline drops which helped. He feels ok today. I advised his symptoms may have been due to dehydration or being out in the heat. I reviewed with Dr. Harrington Challenger (DOD). She recommends that if symptoms reoccur to follow up with his eye doctor for an exam. I have made him aware of this and suggested this may be neuro related as well and if he has a reoccurrence and his eye exam is normal, he should follow up with his PCP.

## 2013-10-12 ENCOUNTER — Ambulatory Visit: Payer: Medicare Other | Admitting: Cardiovascular Disease

## 2013-10-19 ENCOUNTER — Other Ambulatory Visit: Payer: Self-pay | Admitting: Cardiovascular Disease

## 2013-10-21 ENCOUNTER — Other Ambulatory Visit: Payer: Self-pay

## 2013-10-21 MED ORDER — FUROSEMIDE 40 MG PO TABS: ORAL_TABLET | ORAL | Status: DC

## 2013-10-24 ENCOUNTER — Encounter (INDEPENDENT_AMBULATORY_CARE_PROVIDER_SITE_OTHER): Payer: Medicare Other | Admitting: Ophthalmology

## 2013-10-24 DIAGNOSIS — I10 Essential (primary) hypertension: Secondary | ICD-10-CM | POA: Diagnosis not present

## 2013-10-24 DIAGNOSIS — H348192 Central retinal vein occlusion, unspecified eye, stable: Secondary | ICD-10-CM

## 2013-10-24 DIAGNOSIS — H43819 Vitreous degeneration, unspecified eye: Secondary | ICD-10-CM | POA: Diagnosis not present

## 2013-10-24 DIAGNOSIS — H35039 Hypertensive retinopathy, unspecified eye: Secondary | ICD-10-CM

## 2013-10-28 DIAGNOSIS — Z23 Encounter for immunization: Secondary | ICD-10-CM | POA: Diagnosis not present

## 2013-11-01 ENCOUNTER — Other Ambulatory Visit: Payer: Self-pay | Admitting: Cardiovascular Disease

## 2013-11-14 ENCOUNTER — Ambulatory Visit: Payer: Medicare Other | Admitting: Cardiovascular Disease

## 2013-11-16 ENCOUNTER — Encounter (INDEPENDENT_AMBULATORY_CARE_PROVIDER_SITE_OTHER): Payer: Medicare Other | Admitting: Ophthalmology

## 2013-11-16 DIAGNOSIS — H35379 Puckering of macula, unspecified eye: Secondary | ICD-10-CM

## 2013-11-16 DIAGNOSIS — H348192 Central retinal vein occlusion, unspecified eye, stable: Secondary | ICD-10-CM | POA: Diagnosis not present

## 2013-11-16 DIAGNOSIS — I10 Essential (primary) hypertension: Secondary | ICD-10-CM

## 2013-11-16 DIAGNOSIS — H43819 Vitreous degeneration, unspecified eye: Secondary | ICD-10-CM

## 2013-11-16 DIAGNOSIS — H35039 Hypertensive retinopathy, unspecified eye: Secondary | ICD-10-CM

## 2013-11-16 DIAGNOSIS — H2510 Age-related nuclear cataract, unspecified eye: Secondary | ICD-10-CM

## 2013-11-20 ENCOUNTER — Other Ambulatory Visit: Payer: Self-pay | Admitting: Family Medicine

## 2013-11-23 ENCOUNTER — Other Ambulatory Visit: Payer: Self-pay | Admitting: Family Medicine

## 2013-11-24 ENCOUNTER — Other Ambulatory Visit: Payer: Self-pay

## 2013-11-24 MED ORDER — RAMIPRIL 2.5 MG PO CAPS: ORAL_CAPSULE | ORAL | Status: DC

## 2013-12-05 ENCOUNTER — Ambulatory Visit (INDEPENDENT_AMBULATORY_CARE_PROVIDER_SITE_OTHER): Payer: Medicare Other | Admitting: Cardiovascular Disease

## 2013-12-05 ENCOUNTER — Encounter: Payer: Self-pay | Admitting: Cardiovascular Disease

## 2013-12-05 VITALS — BP 100/72 | HR 67 | Ht 74.0 in | Wt 217.8 lb

## 2013-12-05 DIAGNOSIS — I712 Thoracic aortic aneurysm, without rupture, unspecified: Secondary | ICD-10-CM | POA: Diagnosis not present

## 2013-12-05 DIAGNOSIS — E785 Hyperlipidemia, unspecified: Secondary | ICD-10-CM

## 2013-12-05 DIAGNOSIS — I2510 Atherosclerotic heart disease of native coronary artery without angina pectoris: Secondary | ICD-10-CM

## 2013-12-05 DIAGNOSIS — I509 Heart failure, unspecified: Secondary | ICD-10-CM

## 2013-12-05 DIAGNOSIS — I428 Other cardiomyopathies: Secondary | ICD-10-CM | POA: Diagnosis not present

## 2013-12-05 DIAGNOSIS — I5022 Chronic systolic (congestive) heart failure: Secondary | ICD-10-CM

## 2013-12-05 DIAGNOSIS — R0989 Other specified symptoms and signs involving the circulatory and respiratory systems: Secondary | ICD-10-CM

## 2013-12-05 NOTE — Progress Notes (Signed)
i   History of Present Illness: Dominic Kane is a pleasant 73 year-old Caucasian male with a past medical history significant for diastolic and systolic CHF, hypertension, nonobstructive coronary artery disease, idiopathic cardiomyopathy, benign prostatic hypertrophy, hyperlipidemia and hiatal hernia, who presents today for routine cardiac followup. Dominic Kane had an episode of chest pain about 7 years ago; at which time, he was evaluated by the [facility] in [place], [place]. He was apparently known to have an ejection fraction of 35-40%, which was felt to be idiopathic. Left heart catheterization was performed in May 2005 and showed diffuse 15% irregularities in the LAD, diffuse 20% irregularities in the ramus intermedius branch, diffuse 30% irregularities in the nondominant circumflex, and a 50% stenosis in the proximal RCA with irregularities in the midportion of the vessel. The ascending aorta was mildly dilated with no evidence of dissection during that catheterization. Ejection fraction was noted to be 65%. An echocardiogram during that admission in May 2005 in [place] showed an ejection fraction of 55-60% with the ascending aorta at the sinotubular junction measuring 4.1 cm. LVEF has been noted to be around 45% on studies in 2010, 2011. Echo September 2013 suggested LVEF was around 30%. Cardiac MRI December 2014 showed mild enlargement of the aortic root (4.2 cm) and and EF of 44%. Last stress test 9/13 without ischemia.   He is here today for follow up. He denies chest pain. He has continued dyspnea. NO changes. No awareness of palpitations, near syncope, syncope, orthopnea, PND, LE edema. He is very active.   Primary Care Physician: Eliezer Lofts  Last Lipid Profile:Lipid Panel     Component Value Date/Time   CHOL 125 05/24/2012 0818   TRIG 106.0 05/24/2012 0818   HDL 33.50* 05/24/2012 0818   CHOLHDL 4 05/24/2012 0818   VLDL 21.2 05/24/2012 0818   LDLCALC 70 05/24/2012 0818     Past  Medical History  Diagnosis Date  . Coronary atherosclerosis of native coronary artery   . Acute sinusitis, unspecified   . 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 Unspecified vitamin D deficiency   . Dysfunction of eustachian tube   . Abnormality of gait   . Aortic valve disorders   . Sinoatrial node dysfunction   . Lipoprotein deficiencies   . Other primary cardiomyopathies   . Unspecified essential hypertension   . Other and unspecified hyperlipidemia   . Displacement of lumbar intervertebral disc without myelopathy   . Unspecified disorder of skin and subcutaneous tissue   . Benign paroxysmal positional vertigo   . Headache(784.0)   . Hypertrophy of prostate without urinary obstruction and other lower urinary tract symptoms (LUTS)   . Pain in joint, lower leg   . Pes anserinus tendinitis or bursitis   . Thoracic or lumbosacral neuritis or radiculitis, unspecified   . Personal history of other diseases of digestive system   . Personal history of colonic polyps   . Diverticulosis of colon (without mention of hemorrhage)     Past Surgical History  Procedure Laterality Date  . Knee surgery  1947  . Esophagogastroduodenoscopy  11/07    Schatzki's ring, non bleeding erosive gastropathy [facility] [place]  . Dobutamine stress echo  1/07    Lateral hypokinesis but no ischemia  . Pulmonary functioning tests  1. 2003  2. 2005    1. Diminished lung capacity  2. Stable    Current Outpatient Prescriptions  Medication Sig Dispense Refill  . aspirin 81 MG tablet Take 81  mg by mouth daily.        . CRESTOR 10 MG tablet Take one tablet by mouth one time daily  90 tablet  1  . diclofenac sodium (VOLTAREN) 1 % GEL APPLY 2 GM TO AFFECTED AREA(S) FOUR TIMES A DAY AS NEEDED  500 g  0  . finasteride (PROSCAR) 5 MG tablet TAKE ONE TABLET BY MOUTH ONE TIME DAILY   30 tablet  0  . furosemide (LASIX) 40 MG tablet TAKE ONE TABLET BY MOUTH ONE TIME DAILY  30 tablet  3  . Melatonin 3 MG CAPS  Take 1 capsule by mouth at bedtime as needed.      . Multiple Vitamins-Minerals (OCUVITE PRESERVISION PO) Take by mouth daily.      [name] omeprazole (PRILOSEC) 40 MG capsule TAKE ONE CAPSULE BY MOUTH ONE TIME DAILY   30 capsule  5  . ramipril (ALTACE) 2.5 MG capsule TAKE ONE CAPSULE BY MOUTH ONE TIME DAILY  30 capsule  6  . terazosin (HYTRIN) 10 MG capsule TAKE ONE CAPSULE BY MOUTH ONE TIME DAILY   90 capsule  0   No current facility-administered medications for this visit.    Allergies  Allergen Reactions  . Codeine     REACTION: nausea  . Sulfonamide Derivatives     REACTION: hives    History   Social History  . Marital Status: Married    Spouse Name: N/A    Number of Children: 1  . Years of Education: N/A   Occupational History  . retired Teacher, English as a foreign language    Social History Main Topics  . Smoking status: Former Smoker    Quit date: 07/03/1980  . Smokeless tobacco: Never Used  . Alcohol Use: Yes     Comment: occ  . Drug Use: No  . Sexual Activity: Not on file   Other Topics Concern  . Not on file   Social History Narrative   REGULAR EXERCISE-Yes, bicycle 4 times per week      Diet: fruit and veggies, 40lb weight loss in 2 years    Family History  Problem Relation Age of Onset  . Heart failure Mother   . Coronary artery disease Mother   . Stroke Mother   . Cancer Father     LUNG  . Cancer Sister     BREAST  . Heart failure Maternal Grandmother     Review of Systems:  As stated in the HPI and otherwise negative.   BP 100/72  Pulse 67  Ht 6\' 2"  (1.88 m)  Wt 217 lb 12.8 oz (98.793 kg)  BMI 27.95 kg/m2  Physical Examination: General: Well developed, well nourished, NAD HEENT: OP clear, mucus membranes moist SKIN: warm, dry. No rashes. Neuro: No focal deficits Musculoskeletal: Muscle strength 5/5 all ext Psychiatric: Mood and affect normal Neck: No JVD, no carotid bruits, no thyromegaly, no lymphadenopathy. Lungs:Clear bilaterally, no wheezes, rhonci,  crackles Cardiovascular: Regular rate and rhythm. No murmurs, gallops or rubs. Abdomen:Soft. Bowel sounds present. Non-tender.  Extremities: No lower extremity edema. Pulses are 2 + in the bilateral DP/PT.  EKG: NSR, RBBB, LAFB, LVH  Echo 12/10/11: Left ventricle: The cavity size was normal. Wall thickness was increased in a pattern of mild LVH. The estimated ejection fraction was 30%. Diffuse hypokinesis. Doppler parameters are consistent with abnormal left ventricular relaxation (grade 1 diastolic dysfunction). - Aortic valve: There was no stenosis. Trivial regurgitation. - Aorta: Ascending aorta dimension: 43 mm. Mildly dilated aortic root and ascending aorta.  Aortic root dimension:35mm (ED). - Mitral valve: Trivial regurgitation. - Left atrium: The atrium was mildly dilated. - Right ventricle: The cavity size was normal. Systolic function was mildly to moderately reduced. - Tricuspid valve: Peak RV-RA gradient: 55mm Hg (S). - Pulmonary arteries: PA peak pressure: 60mm Hg (S). - Systemic veins: IVC measured 1.9 cm with some respirophasic variation, suggesting RA pressure 10 mmHg. Impressions:  - Normal LV size with mild LV hypertrophy. EF 30% with diffuse hypokinesis. Normal RV size with mild to moderate hypokinesis. No significant valvular abnormalities. Dilation of the aortic root and ascending aorta. Would consider MRA of the chest to further delineate the thoracic aorta.  Cardiac MRI 02/22/13: On limited views of the lung fields, there were no gross abnormalities. Normal left ventricular size with normal wall thickness. There was global hypokinesis with EF calculated to be 44%. The right ventricle was normal in size with mild systolic dysfunction. The left atrium was mildly dilated. The right atrium was normal in size. The aortic valve was trileaflet. It opened well, I do not think that there was significant aortic stenosis. There was trivial aortic insufficiency.  Significant mitral regurgitation was not noted.  On delayed enhancement imaging, there was no myocardial delayed enhancement.  On MRA of the chest, the aortic root and ascending aorta were mildly dilated. The arch and descending aorta were normal in caliber. The arch vessels originated normally. The pulmonary veins drained normally to the left atrium.  Measurements:  LV EDV 146 mL  LV SV 64 mL  LV EF 44%  Aortic root 4.2 cm  Ascending aorta 4.1 cm (maximal diameter)  Arch 3.3 cm  Descending thoracic aorta 3.0 cm  IMPRESSION: 1. Normal LV size with mild global hypokinesis, EF 44%. There was no myocardial delayed enhancement so no definitive evidence for prior myocardial infarction, infiltrative disease, or myocarditis.  2. Normal RV size with mildly decreased systolic function.  3. Mild aortic root (4.2 cm) and ascending thoracic aorta (4.1 cm) dilation.  Assessment and Plan:   1. CAD: No chest pains. Appears stable. Normal stress test September 2013. He is known to have non-obstructive CAD by cath 2005. Continue ASA and statin.   2. Non-ischemic Cardiomyopathy: LVEF 44% by cardiac MRI 12/14 Continue Ace-inh. No beta blocker with bradycardia.   3. Thoracic aortic aneurysm:  Last imaging December 2014 by MRI, stable.   Will repeat every two years since it has been stable since 2005.   4. HLD: Continue statin. Lipids followed in primary care.   5. Chronic systolic CHF: Volume status is ok today.   6. Carotid bruit, right: Will check carotid dopplers before follow up

## 2013-12-05 NOTE — Patient Instructions (Signed)
Your physician wants you to follow-up in:  4 months.  You will receive a reminder letter in the mail two months in advance. If you don't receive a letter, please call our office to schedule the follow-up appointment.  Your physician has requested that you have a carotid duplex. This test is an ultrasound of the carotid arteries in your neck. It looks at blood flow through these arteries that supply the brain with blood. Allow one hour for this exam. There are no restrictions or special instructions. To be done in 4 months on day of appointment with [name]

## 2013-12-12 ENCOUNTER — Encounter (INDEPENDENT_AMBULATORY_CARE_PROVIDER_SITE_OTHER): Payer: Medicare Other | Admitting: Ophthalmology

## 2013-12-12 DIAGNOSIS — H348192 Central retinal vein occlusion, unspecified eye, stable: Secondary | ICD-10-CM | POA: Diagnosis not present

## 2013-12-12 DIAGNOSIS — H35039 Hypertensive retinopathy, unspecified eye: Secondary | ICD-10-CM

## 2013-12-12 DIAGNOSIS — H35379 Puckering of macula, unspecified eye: Secondary | ICD-10-CM | POA: Diagnosis not present

## 2013-12-12 DIAGNOSIS — I10 Essential (primary) hypertension: Secondary | ICD-10-CM | POA: Diagnosis not present

## 2013-12-12 DIAGNOSIS — H43819 Vitreous degeneration, unspecified eye: Secondary | ICD-10-CM

## 2014-01-06 ENCOUNTER — Other Ambulatory Visit: Payer: Self-pay

## 2014-01-08 ENCOUNTER — Other Ambulatory Visit: Payer: Self-pay | Admitting: Internal Medicine

## 2014-01-09 ENCOUNTER — Encounter (INDEPENDENT_AMBULATORY_CARE_PROVIDER_SITE_OTHER): Payer: Medicare Other | Admitting: Ophthalmology

## 2014-01-09 DIAGNOSIS — H35033 Hypertensive retinopathy, bilateral: Secondary | ICD-10-CM

## 2014-01-09 DIAGNOSIS — H34811 Central retinal vein occlusion, right eye: Secondary | ICD-10-CM | POA: Diagnosis not present

## 2014-01-09 DIAGNOSIS — H35372 Puckering of macula, left eye: Secondary | ICD-10-CM

## 2014-01-09 DIAGNOSIS — I10 Essential (primary) hypertension: Secondary | ICD-10-CM

## 2014-01-09 DIAGNOSIS — H43813 Vitreous degeneration, bilateral: Secondary | ICD-10-CM

## 2014-01-24 ENCOUNTER — Encounter: Payer: Self-pay | Admitting: Internal Medicine

## 2014-01-29 ENCOUNTER — Other Ambulatory Visit: Payer: Self-pay | Admitting: Cardiovascular Disease

## 2014-01-30 ENCOUNTER — Encounter: Payer: Self-pay | Admitting: Internal Medicine

## 2014-02-06 ENCOUNTER — Encounter (INDEPENDENT_AMBULATORY_CARE_PROVIDER_SITE_OTHER): Payer: Medicare Other | Admitting: Ophthalmology

## 2014-02-06 DIAGNOSIS — H2513 Age-related nuclear cataract, bilateral: Secondary | ICD-10-CM

## 2014-02-06 DIAGNOSIS — H43813 Vitreous degeneration, bilateral: Secondary | ICD-10-CM

## 2014-02-06 DIAGNOSIS — H35372 Puckering of macula, left eye: Secondary | ICD-10-CM | POA: Diagnosis not present

## 2014-02-06 DIAGNOSIS — H34811 Central retinal vein occlusion, right eye: Secondary | ICD-10-CM

## 2014-02-06 DIAGNOSIS — I10 Essential (primary) hypertension: Secondary | ICD-10-CM

## 2014-02-06 DIAGNOSIS — H35033 Hypertensive retinopathy, bilateral: Secondary | ICD-10-CM | POA: Diagnosis not present

## 2014-02-08 ENCOUNTER — Other Ambulatory Visit: Payer: Self-pay | Admitting: Family Medicine

## 2014-02-18 ENCOUNTER — Other Ambulatory Visit: Payer: Self-pay | Admitting: Family Medicine

## 2014-02-18 NOTE — Telephone Encounter (Signed)
Last office visit 07/27/2013 for Knee/Back Pain.  Last CPE 07/01/2012.  No future appointments scheduled.  Ok to refill?

## 2014-02-20 ENCOUNTER — Telehealth: Payer: Self-pay | Admitting: Cardiovascular Disease

## 2014-02-20 NOTE — Telephone Encounter (Signed)
New Msg   Patient returning call, he is not sure who attempted to contact him. Please contact patient at [phone].

## 2014-02-20 NOTE — Telephone Encounter (Signed)
Spoke with pt. Appt made for carotid doppler on April 18, 2014 at 8:00 and 4 month follow up with [name] on April 18, 2014 at 9:30

## 2014-03-06 ENCOUNTER — Encounter (INDEPENDENT_AMBULATORY_CARE_PROVIDER_SITE_OTHER): Payer: Medicare Other | Admitting: Ophthalmology

## 2014-03-06 DIAGNOSIS — H34811 Central retinal vein occlusion, right eye: Secondary | ICD-10-CM

## 2014-03-17 ENCOUNTER — Other Ambulatory Visit: Payer: Self-pay | Admitting: Cardiovascular Disease

## 2014-03-20 ENCOUNTER — Telehealth: Payer: Self-pay

## 2014-03-21 NOTE — Telephone Encounter (Signed)
error 

## 2014-03-30 ENCOUNTER — Telehealth: Payer: Self-pay | Admitting: *Deleted

## 2014-03-30 NOTE — Telephone Encounter (Signed)
Dr. Carlean Purl, This pt is coming in for a PV next Tuesday and his colonoscopy is scheduled fo 04-14-14.  Upon reviewing his chart, I noted that he has a significant cardiac hx.  He has chronic CHF and his last Echo in 2013 showed an EF of 30%.  I did note in his last OV with his cardiologist that his 2014 cardiac MRI showed an EF of 44%.  The note states pt c/o dyspnea.   Do you want an OV with this pt?    Cyril Mourning

## 2014-03-30 NOTE — Telephone Encounter (Signed)
I think he is ok for direct.

## 2014-04-03 ENCOUNTER — Encounter (INDEPENDENT_AMBULATORY_CARE_PROVIDER_SITE_OTHER): Payer: Medicare Other | Admitting: Ophthalmology

## 2014-04-03 DIAGNOSIS — I10 Essential (primary) hypertension: Secondary | ICD-10-CM

## 2014-04-03 DIAGNOSIS — H35033 Hypertensive retinopathy, bilateral: Secondary | ICD-10-CM

## 2014-04-03 DIAGNOSIS — H35372 Puckering of macula, left eye: Secondary | ICD-10-CM | POA: Diagnosis not present

## 2014-04-03 DIAGNOSIS — H34811 Central retinal vein occlusion, right eye: Secondary | ICD-10-CM | POA: Diagnosis not present

## 2014-04-03 DIAGNOSIS — H43813 Vitreous degeneration, bilateral: Secondary | ICD-10-CM

## 2014-04-04 ENCOUNTER — Encounter (HOSPITAL_COMMUNITY): Payer: No Typology Code available for payment source

## 2014-04-04 ENCOUNTER — Ambulatory Visit (AMBULATORY_SURGERY_CENTER): Payer: Self-pay | Admitting: *Deleted

## 2014-04-04 VITALS — Ht 74.0 in | Wt 227.6 lb

## 2014-04-04 DIAGNOSIS — Z86010 Personal history of colonic polyps: Secondary | ICD-10-CM

## 2014-04-04 NOTE — Progress Notes (Signed)
No allergies to eggs or soy. No problems with anesthesia.  Pt given Emmi instructions for colonoscopy  No oxygen use  No diet drug use  

## 2014-04-07 ENCOUNTER — Other Ambulatory Visit: Payer: Self-pay | Admitting: Cardiology

## 2014-04-12 ENCOUNTER — Encounter: Payer: Self-pay | Admitting: Internal Medicine

## 2014-04-14 ENCOUNTER — Encounter: Payer: No Typology Code available for payment source | Admitting: Internal Medicine

## 2014-04-18 ENCOUNTER — Ambulatory Visit (INDEPENDENT_AMBULATORY_CARE_PROVIDER_SITE_OTHER): Payer: Medicare Other | Admitting: Cardiovascular Disease

## 2014-04-18 ENCOUNTER — Encounter: Payer: Self-pay | Admitting: Cardiovascular Disease

## 2014-04-18 ENCOUNTER — Ambulatory Visit (HOSPITAL_COMMUNITY): Payer: Medicare Other | Attending: Cardiovascular Disease | Admitting: Cardiology

## 2014-04-18 VITALS — BP 112/70 | HR 75 | Ht 74.0 in | Wt 225.6 lb

## 2014-04-18 DIAGNOSIS — I429 Cardiomyopathy, unspecified: Secondary | ICD-10-CM | POA: Diagnosis not present

## 2014-04-18 DIAGNOSIS — R51 Headache: Secondary | ICD-10-CM | POA: Diagnosis not present

## 2014-04-18 DIAGNOSIS — E785 Hyperlipidemia, unspecified: Secondary | ICD-10-CM | POA: Diagnosis not present

## 2014-04-18 DIAGNOSIS — R42 Dizziness and giddiness: Secondary | ICD-10-CM | POA: Diagnosis not present

## 2014-04-18 DIAGNOSIS — I5022 Chronic systolic (congestive) heart failure: Secondary | ICD-10-CM | POA: Diagnosis not present

## 2014-04-18 DIAGNOSIS — I2510 Atherosclerotic heart disease of native coronary artery without angina pectoris: Secondary | ICD-10-CM | POA: Diagnosis not present

## 2014-04-18 DIAGNOSIS — I428 Other cardiomyopathies: Secondary | ICD-10-CM

## 2014-04-18 DIAGNOSIS — I712 Thoracic aortic aneurysm, without rupture, unspecified: Secondary | ICD-10-CM

## 2014-04-18 DIAGNOSIS — R0989 Other specified symptoms and signs involving the circulatory and respiratory systems: Secondary | ICD-10-CM | POA: Diagnosis not present

## 2014-04-18 DIAGNOSIS — R2681 Unsteadiness on feet: Secondary | ICD-10-CM | POA: Diagnosis not present

## 2014-04-18 DIAGNOSIS — R519 Headache, unspecified: Secondary | ICD-10-CM

## 2014-04-18 NOTE — Patient Instructions (Signed)
Your physician wants you to follow-up in:  12 months.  You will receive a reminder letter in the mail two months in advance. If you don't receive a letter, please call our office to schedule the follow-up appointment.  Your physician recommends that you return for fasting  lab work this Friday--The lab opens at 7:30 AM

## 2014-04-18 NOTE — Progress Notes (Signed)
Carotid duplex performed 

## 2014-04-18 NOTE — Progress Notes (Signed)
i   History of Present Illness: 74 yo white male with a past medical history significant for diastolic and systolic CHF, hypertension, nonobstructive coronary artery disease, idiopathic cardiomyopathy, benign prostatic hypertrophy, hyperlipidemia and hiatal hernia, who presents today for routine cardiac followup. Mr. Barsky had an episode of chest pain about 7 years ago; at which time, he was evaluated by the [facility] in [place], [place]. He was apparently known to have an ejection fraction of 35-40%, which was felt to be idiopathic. Left heart catheterization was performed in May 2005 and showed diffuse 15% irregularities in the LAD, diffuse 20% irregularities in the ramus intermedius branch, diffuse 30% irregularities in the nondominant circumflex, and a 50% stenosis in the proximal RCA with irregularities in the midportion of the vessel. The ascending aorta was mildly dilated with no evidence of dissection during that catheterization. Ejection fraction was noted to be 65%. An echocardiogram during that admission in May 2005 in [place] showed an ejection fraction of 55-60% with the ascending aorta at the sinotubular junction measuring 4.1 cm. LVEF has been noted to be around 45% on studies in 2010, 2011. Echo September 2013 suggested LVEF was around 30%. Cardiac MRI December 2014 showed mild enlargement of the aortic root (4.2 cm) and and EF of 44%. Last stress test 9/13 without ischemia.   He is here today for follow up. He denies chest pain. He has occasional dyspnea but this has not changed. No awareness of palpitations, near syncope, syncope, orthopnea, PND, LE edema. He is very active.   Primary Care Physician: Eliezer Lofts  Last Lipid Profile:Lipid Panel     Component Value Date/Time   CHOL 125 05/24/2012 0818   TRIG 106.0 05/24/2012 0818   HDL 33.50* 05/24/2012 0818   CHOLHDL 4 05/24/2012 0818   VLDL 21.2 05/24/2012 0818   LDLCALC 70 05/24/2012 0818     Past Medical  History  Diagnosis Date  . Coronary atherosclerosis of native coronary artery   . Acute sinusitis, unspecified   . 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 Unspecified vitamin D deficiency   . Dysfunction of eustachian tube   . Abnormality of gait   . Aortic valve disorders   . Sinoatrial node dysfunction   . Lipoprotein deficiencies   . Other primary cardiomyopathies   . Unspecified essential hypertension   . Other and unspecified hyperlipidemia   . Displacement of lumbar intervertebral disc without myelopathy   . Unspecified disorder of skin and subcutaneous tissue   . Benign paroxysmal positional vertigo   . Headache(784.0)   . Hypertrophy of prostate without urinary obstruction and other lower urinary tract symptoms (LUTS)   . Pain in joint, lower leg   . Pes anserinus tendinitis or bursitis   . Thoracic or lumbosacral neuritis or radiculitis, unspecified   . Personal history of other diseases of digestive system   . Personal history of colonic polyps   . Diverticulosis of colon (without mention of hemorrhage)   . Allergy   . CHF (congestive heart failure)   . GERD (gastroesophageal reflux disease)   . Cataract     Past Surgical History  Procedure Laterality Date  . Knee surgery Left 1947  . Esophagogastroduodenoscopy  11/07    Schatzki's ring, non bleeding erosive gastropathy [facility] [place]  . Dobutamine stress echo  1/07    Lateral hypokinesis but no ischemia  . Pulmonary functioning tests  1. 2003  2. 2005    1. Diminished lung capacity  2.  Stable    Current Outpatient Prescriptions  Medication Sig Dispense Refill  . aspirin 81 MG tablet Take 81 mg by mouth daily.      . Bisacodyl (DULCOLAX PO) Take by mouth as directed. Dulcolax as directed for colonoscopy prep    . Cetirizine HCl (KLS ALLER-TEC PO) Take by mouth daily.    . CRESTOR 10 MG tablet TAKE ONE TABLET BY MOUTH ONE TIME DAILY  90 tablet 0  . diclofenac sodium (VOLTAREN) 1 % GEL APPLY 2 GM TO  AFFECTED AREA(S) FOUR TIMES A DAY AS NEEDED 500 g 0  . finasteride (PROSCAR) 5 MG tablet TAKE ONE TABLET BY MOUTH ONE TIME DAILY  30 tablet 3  . furosemide (LASIX) 40 MG tablet TAKE ONE TABLET BY MOUTH ONE TIME DAILY  30 tablet 2  . Melatonin 3 MG CAPS Take 1 capsule by mouth at bedtime as needed.    . Multiple Vitamins-Minerals (OCUVITE PRESERVISION PO) Take by mouth daily.    [name] omeprazole (PRILOSEC) 40 MG capsule TAKE ONE CAPSULE BY MOUTH ONE TIME DAILY  30 capsule 5  . Polyethylene Glycol 3350 (MIRALAX PO) Take by mouth as directed. Miralax 238 Grams as directed for colonoscopy prep    . ramipril (ALTACE) 2.5 MG capsule TAKE ONE CAPSULE BY MOUTH ONE TIME DAILY 30 capsule 6  . terazosin (HYTRIN) 10 MG capsule TAKE ONE CAPSULE BY MOUTH ONE TIME DAILY  90 capsule 3   No current facility-administered medications for this visit.    Allergies  Allergen Reactions  . Codeine     REACTION: nausea  . Sulfonamide Derivatives     REACTION: hives    History   Social History  . Marital Status: Married    Spouse Name: N/A    Number of Children: 1  . Years of Education: N/A   Occupational History  . retired Teacher, English as a foreign language    Social History Main Topics  . Smoking status: Former Smoker    Quit date: 07/03/1980  . Smokeless tobacco: Never Used  . Alcohol Use: 0.6 oz/week    1 Glasses of wine, 0 Not specified per week  . Drug Use: No  . Sexual Activity: Not on file   Other Topics Concern  . Not on file   Social History Narrative   REGULAR EXERCISE-Yes, bicycle 4 times per week      Diet: fruit and veggies, 40lb weight loss in 2 years    Family History  Problem Relation Age of Onset  . Heart failure Mother   . Coronary artery disease Mother   . Stroke Mother   . Cancer Father     LUNG  . Cancer Sister     BREAST  . Heart failure Maternal Grandmother   . Colon cancer Neg Hx     Review of Systems:  As stated in the HPI and otherwise negative.   BP 112/70 mmHg  Pulse 75   Ht 6\' 2"  (1.88 m)  Wt 225 lb 9.6 oz (102.331 kg)  BMI 28.95 kg/m2  SpO2 93%  Physical Examination: General: Well developed, well nourished, NAD HEENT: OP clear, mucus membranes moist SKIN: warm, dry. No rashes. Neuro: No focal deficits Musculoskeletal: Muscle strength 5/5 all ext Psychiatric: Mood and affect normal Neck: No JVD, no carotid bruits, no thyromegaly, no lymphadenopathy. Lungs:Clear bilaterally, no wheezes, rhonci, crackles Cardiovascular: Regular rate and rhythm. No murmurs, gallops or rubs. Abdomen:Soft. Bowel sounds present. Non-tender.  Extremities: No lower extremity edema. Pulses are 2 + in  the bilateral DP/PT.   Echo 12/10/11: Left ventricle: The cavity size was normal. Wall thickness was increased in a pattern of mild LVH. The estimated ejection fraction was 30%. Diffuse hypokinesis. Doppler parameters are consistent with abnormal left ventricular relaxation (grade 1 diastolic dysfunction). - Aortic valve: There was no stenosis. Trivial regurgitation. - Aorta: Ascending aorta dimension: 43 mm. Mildly dilated aortic root and ascending aorta. Aortic root dimension:61mm (ED). - Mitral valve: Trivial regurgitation. - Left atrium: The atrium was mildly dilated. - Right ventricle: The cavity size was normal. Systolic function was mildly to moderately reduced. - Tricuspid valve: Peak RV-RA gradient: 38mm Hg (S). - Pulmonary arteries: PA peak pressure: 13mm Hg (S). - Systemic veins: IVC measured 1.9 cm with some respirophasic variation, suggesting RA pressure 10 mmHg. Impressions:  - Normal LV size with mild LV hypertrophy. EF 30% with diffuse hypokinesis. Normal RV size with mild to moderate hypokinesis. No significant valvular abnormalities. Dilation of the aortic root and ascending aorta. Would consider MRA of the chest to further delineate the thoracic aorta.  Cardiac MRI 02/22/13: On limited views of the lung fields, there were no gross abnormalities.  Normal left ventricular size with normal wall thickness. There was global hypokinesis with EF calculated to be 44%. The right ventricle was normal in size with mild systolic dysfunction. The left atrium was mildly dilated. The right atrium was normal in size. The aortic valve was trileaflet. It opened well, I do not think that there was significant aortic stenosis. There was trivial aortic insufficiency. Significant mitral regurgitation was not noted.  On delayed enhancement imaging, there was no myocardial delayed enhancement.  On MRA of the chest, the aortic root and ascending aorta were mildly dilated. The arch and descending aorta were normal in caliber. The arch vessels originated normally. The pulmonary veins drained normally to the left atrium.  Measurements:  LV EDV 146 mL  LV SV 64 mL  LV EF 44%  Aortic root 4.2 cm  Ascending aorta 4.1 cm (maximal diameter)  Arch 3.3 cm  Descending thoracic aorta 3.0 cm  IMPRESSION: 1. Normal LV size with mild global hypokinesis, EF 44%. There was no myocardial delayed enhancement so no definitive evidence for prior myocardial infarction, infiltrative disease, or myocarditis.  2. Normal RV size with mildly decreased systolic function.  3. Mild aortic root (4.2 cm) and ascending thoracic aorta (4.1 cm) dilation.  Assessment and Plan:   1. CAD: No chest pains. Appears stable. Normal stress test September 2013. He is known to have non-obstructive CAD by cath 2005. Continue ASA and statin.   2. Non-ischemic Cardiomyopathy: LVEF 44% by cardiac MRI 12/14 Continue Ace-inh. No beta blocker with bradycardia.   3. Thoracic aortic aneurysm:  Last imaging December 2014 by MRI, stable.   Will repeat every three years since it has been stable since 2005. Will repeat December 2017.   4. HLD: Continue statin. Will check lipids and LFTs next week.   5. Chronic systolic CHF: Volume status is ok today. Extra Lasix as needed for weight gain.    6. Carotid bruit, right: Carotid dopplers today with no evidence of carotid artery disease.

## 2014-04-19 ENCOUNTER — Telehealth: Payer: Self-pay | Admitting: *Deleted

## 2014-04-19 NOTE — Telephone Encounter (Signed)
Dr. Carlean Purl and Jenny Reichmann,  Dominic Kane is scheduled for previsit 04/21/14 and colonoscopy 05/01/14. He has an EF on echo 11/2011 of 30%. Cardiac MRI 02/2013 shows EF 44%. He just had cardiac followup yesterday and appears to be fairly active without cardiac symptoms. Just wanted your approval to proceed with appointments as scheduled or if you felt the need for an office visit or hospital procedure. Please advise.  Thank you, Olivia Mackie

## 2014-04-19 NOTE — Telephone Encounter (Signed)
Dominic Kane,  This pt is cleared for care at [facility]  Thanks,  Jenny Reichmann

## 2014-04-21 ENCOUNTER — Other Ambulatory Visit (INDEPENDENT_AMBULATORY_CARE_PROVIDER_SITE_OTHER): Payer: Medicare Other

## 2014-04-21 ENCOUNTER — Ambulatory Visit (AMBULATORY_SURGERY_CENTER): Payer: Self-pay | Admitting: *Deleted

## 2014-04-21 VITALS — Ht 74.0 in | Wt 225.4 lb

## 2014-04-21 DIAGNOSIS — E785 Hyperlipidemia, unspecified: Secondary | ICD-10-CM | POA: Diagnosis not present

## 2014-04-21 DIAGNOSIS — Z86010 Personal history of colonic polyps: Secondary | ICD-10-CM

## 2014-04-21 NOTE — Progress Notes (Signed)
No egg or soy allergy No diet pills No home 02 use No issues with past sedation except post op n/v at times Pt had a PV 04-04-2014 with Zannie Cove RN for his procedure. Had to Rs procedure due to snow and was sent back for second PV. ewm

## 2014-05-01 ENCOUNTER — Encounter: Payer: Self-pay | Admitting: Internal Medicine

## 2014-05-01 ENCOUNTER — Other Ambulatory Visit: Payer: Self-pay | Admitting: Cardiovascular Disease

## 2014-05-01 ENCOUNTER — Ambulatory Visit (AMBULATORY_SURGERY_CENTER): Payer: Medicare Other | Admitting: Internal Medicine

## 2014-05-01 VITALS — BP 102/73 | HR 52 | Temp 97.4°F | Resp 16 | Ht 74.0 in | Wt 225.0 lb

## 2014-05-01 DIAGNOSIS — I2510 Atherosclerotic heart disease of native coronary artery without angina pectoris: Secondary | ICD-10-CM | POA: Diagnosis not present

## 2014-05-01 DIAGNOSIS — I429 Cardiomyopathy, unspecified: Secondary | ICD-10-CM | POA: Diagnosis not present

## 2014-05-01 DIAGNOSIS — Z1211 Encounter for screening for malignant neoplasm of colon: Secondary | ICD-10-CM | POA: Diagnosis not present

## 2014-05-01 DIAGNOSIS — D122 Benign neoplasm of ascending colon: Secondary | ICD-10-CM

## 2014-05-01 DIAGNOSIS — N400 Enlarged prostate without lower urinary tract symptoms: Secondary | ICD-10-CM | POA: Diagnosis not present

## 2014-05-01 DIAGNOSIS — D120 Benign neoplasm of cecum: Secondary | ICD-10-CM

## 2014-05-01 DIAGNOSIS — D123 Benign neoplasm of transverse colon: Secondary | ICD-10-CM | POA: Diagnosis not present

## 2014-05-01 DIAGNOSIS — K635 Polyp of colon: Secondary | ICD-10-CM | POA: Diagnosis not present

## 2014-05-01 DIAGNOSIS — Z86010 Personal history of colonic polyps: Secondary | ICD-10-CM | POA: Diagnosis not present

## 2014-05-01 DIAGNOSIS — K5730 Diverticulosis of large intestine without perforation or abscess without bleeding: Secondary | ICD-10-CM | POA: Diagnosis not present

## 2014-05-01 MED ORDER — SODIUM CHLORIDE 0.9 % IV SOLN: 500.0000 mL | INTRAVENOUS | Status: DC

## 2014-05-01 NOTE — Op Note (Signed)
[place]  [employer] [place], [address]   COLONOSCOPY PROCEDURE REPORT  PATIENT: [name], [name]  MR#: [id number] BIRTHDATE: 02/22/1941 , 73  yrs. old GENDER: male ENDOSCOPIST: Gatha Mayer, MD, [facility] PROCEDURE DATE:  05/01/2014 PROCEDURE:   Colonoscopy with biopsy First Screening Colonoscopy - Avg.  risk and is 50 yrs.  old or older - No.  Prior Negative Screening - Now for repeat screening. N/A  History of Adenoma - Now for follow-up colonoscopy & has been > or = to 3 yrs.  Yes hx of adenoma.  Has been 3 or more years since last colonoscopy.  Polyps Removed Today? Yes. ASA CLASS:   Class II INDICATIONS:high risk patient with personal history of colonic polyps. MEDICATIONS: Propofol 250 mg IV, Monitored anesthesia care, and 40 mg Lidocaine IV  DESCRIPTION OF PROCEDURE:   After the risks benefits and alternatives of the procedure were thoroughly explained, informed consent was obtained.  The digital rectal exam revealed no abnormalities of the rectum.   The [id number]  endoscope was introduced through the anus and advanced to the cecum, which was identified by both the appendix and ileocecal valve. No adverse events experienced.   The quality of the prep was adequate, using MiraLax  The instrument was then slowly withdrawn as the colon was fully examined.      COLON FINDINGS: 1) Three 2 mm polyps removed with cold forceps and sent to pathology.  cecum, ascending and transverse locations. 2) Severe diverticulosis - left>right colon. 3) Otherwise normal colonoscopy with adequate prep.  Retroflexed views revealed no abnormalities. The time to cecum=4 minutes 14 seconds.  Withdrawal time=17 minutes 23 seconds.  The scope was withdrawn and the procedure completed. COMPLICATIONS: There were no immediate complications.  ENDOSCOPIC IMPRESSION: 1) Three 2 mm polyps removed with cold forceps and sent to pathology.  cecum, ascending and  transverse locations. 2) Severe diverticulosis - left>right colon. 3) Otherwise normal colonoscopy with adequate prep  RECOMMENDATIONS: Timing of repeat colonoscopy will be determined by pathology findings.  eSigned:  Gatha Mayer, MD, [facility] 05/01/2014 3:05 PM   cc: The Patient

## 2014-05-01 NOTE — Assessment & Plan Note (Signed)
2007 - 2 mm adenoma and possibly some prior to this 2010- no polyps 05/01/2014 three 2 mm polyps

## 2014-05-01 NOTE — Progress Notes (Signed)
Called to room to assist during endoscopic procedure.  Patient ID and intended procedure confirmed with present staff. Received instructions for my participation in the procedure from the performing physician.  

## 2014-05-01 NOTE — Progress Notes (Signed)
Stable to RR 

## 2014-05-01 NOTE — Patient Instructions (Addendum)
I found and removed 3 very tiny polyps. You also have a condition called diverticulosis - common and not usually a problem. Please read the handout provided.  I will let you know pathology results and when to have another routine colonoscopy by mail.  I appreciate the opportunity to care for you. Gatha Mayer, MD, FACG   YOU HAD AN ENDOSCOPIC PROCEDURE TODAY AT [facility]: Refer to the procedure report that was given to you for any specific questions about what was found during the examination.  If the procedure report does not answer your questions, please call your gastroenterologist to clarify.  If you requested that your care partner not be given the details of your procedure findings, then the procedure report has been included in a sealed envelope for you to review at your convenience later.  YOU SHOULD EXPECT: Some feelings of bloating in the abdomen. Passage of more gas than usual.  Walking can help get rid of the air that was put into your GI tract during the procedure and reduce the bloating. If you had a lower endoscopy (such as a colonoscopy or flexible sigmoidoscopy) you may notice spotting of blood in your stool or on the toilet paper. If you underwent a bowel prep for your procedure, then you may not have a normal bowel movement for a few days.  DIET: Your first meal following the procedure should be a light meal and then it is ok to progress to your normal diet.  A half-sandwich or bowl of soup is an example of a good first meal.  Heavy or fried foods are harder to digest and may make you feel nauseous or bloated.  Likewise meals heavy in dairy and vegetables can cause extra gas to form and this can also increase the bloating.  Drink plenty of fluids but you should avoid alcoholic beverages for 24 hours.  ACTIVITY: Your care partner should take you home directly after the procedure.  You should plan to take it easy, moving slowly for the rest of the day.   You can resume normal activity the day after the procedure however you should NOT DRIVE or use heavy machinery for 24 hours (because of the sedation medicines used during the test).    SYMPTOMS TO REPORT IMMEDIATELY: A gastroenterologist can be reached at any hour.  During normal business hours, 8:30 AM to 5:00 PM Monday through Friday, call [phone].  After hours and on weekends, please call the GI answering service at [phone] who will take a message and have the physician on call contact you.   Following lower endoscopy (colonoscopy or flexible sigmoidoscopy):  Excessive amounts of blood in the stool  Significant tenderness or worsening of abdominal pains  Swelling of the abdomen that is new, acute  Fever of 100F or higher  If any biopsies were taken you will be contacted by phone or by letter within the next 1-3 weeks.  Call your gastroenterologist if you have not heard about the biopsies in 3 weeks.  Our staff will call the home number listed on your records the next business day following your procedure to check on you and address any questions or concerns that you may have at that time regarding the information given to you following your procedure. This is a courtesy call and so if there is no answer at the home number and we have not heard from you through the emergency physician on call, we will assume that you  have returned to your regular daily activities without incident.  SIGNATURES/CONFIDENTIALITY: You and/or your care partner have signed paperwork which will be entered into your electronic medical record.  These signatures attest to the fact that that the information above on your After Visit Summary has been reviewed and is understood.  Full responsibility of the confidentiality of this discharge information lies with you and/or your care-partner.  Polyp and diverticulosis information given.

## 2014-05-02 ENCOUNTER — Other Ambulatory Visit: Payer: Self-pay | Admitting: Cardiovascular Disease

## 2014-05-02 ENCOUNTER — Telehealth: Payer: Self-pay | Admitting: *Deleted

## 2014-05-02 NOTE — Telephone Encounter (Signed)
Left message on f/u callback 

## 2014-05-05 ENCOUNTER — Encounter (INDEPENDENT_AMBULATORY_CARE_PROVIDER_SITE_OTHER): Payer: Medicare Other | Admitting: Ophthalmology

## 2014-05-05 DIAGNOSIS — I10 Essential (primary) hypertension: Secondary | ICD-10-CM

## 2014-05-05 DIAGNOSIS — H34811 Central retinal vein occlusion, right eye: Secondary | ICD-10-CM

## 2014-05-05 DIAGNOSIS — H35372 Puckering of macula, left eye: Secondary | ICD-10-CM

## 2014-05-05 DIAGNOSIS — H35033 Hypertensive retinopathy, bilateral: Secondary | ICD-10-CM | POA: Diagnosis not present

## 2014-05-05 DIAGNOSIS — H43813 Vitreous degeneration, bilateral: Secondary | ICD-10-CM

## 2014-05-05 DIAGNOSIS — H2513 Age-related nuclear cataract, bilateral: Secondary | ICD-10-CM

## 2014-05-08 ENCOUNTER — Encounter (INDEPENDENT_AMBULATORY_CARE_PROVIDER_SITE_OTHER): Payer: Medicare Other | Admitting: Ophthalmology

## 2014-05-09 ENCOUNTER — Encounter: Payer: Self-pay | Admitting: Internal Medicine

## 2014-05-09 DIAGNOSIS — Z86010 Personal history of colonic polyps: Secondary | ICD-10-CM

## 2014-05-09 NOTE — Progress Notes (Signed)
Quick Note:  3 diminutive adenomas Given overall hx, age May not require a routine repeat colonoscopy Will place a recall for 5 years and see how he is ______

## 2014-05-24 ENCOUNTER — Telehealth: Payer: Self-pay | Admitting: Internal Medicine

## 2014-05-24 NOTE — Telephone Encounter (Signed)
Patient only needs a copy of the report.  I put the procedure report in the mail.  She needs to file with her cancer insurance

## 2014-06-23 DIAGNOSIS — D485 Neoplasm of uncertain behavior of skin: Secondary | ICD-10-CM | POA: Diagnosis not present

## 2014-06-23 DIAGNOSIS — L820 Inflamed seborrheic keratosis: Secondary | ICD-10-CM | POA: Diagnosis not present

## 2014-06-23 DIAGNOSIS — D225 Melanocytic nevi of trunk: Secondary | ICD-10-CM | POA: Diagnosis not present

## 2014-06-26 ENCOUNTER — Encounter (INDEPENDENT_AMBULATORY_CARE_PROVIDER_SITE_OTHER): Payer: Medicare Other | Admitting: Ophthalmology

## 2014-06-26 DIAGNOSIS — H43811 Vitreous degeneration, right eye: Secondary | ICD-10-CM

## 2014-06-26 DIAGNOSIS — H35033 Hypertensive retinopathy, bilateral: Secondary | ICD-10-CM | POA: Diagnosis not present

## 2014-06-26 DIAGNOSIS — H43813 Vitreous degeneration, bilateral: Secondary | ICD-10-CM

## 2014-06-26 DIAGNOSIS — H2513 Age-related nuclear cataract, bilateral: Secondary | ICD-10-CM

## 2014-06-26 DIAGNOSIS — I10 Essential (primary) hypertension: Secondary | ICD-10-CM

## 2014-06-26 DIAGNOSIS — H35372 Puckering of macula, left eye: Secondary | ICD-10-CM

## 2014-07-10 ENCOUNTER — Ambulatory Visit (INDEPENDENT_AMBULATORY_CARE_PROVIDER_SITE_OTHER): Payer: Medicare Other | Admitting: Ophthalmology

## 2014-07-25 ENCOUNTER — Other Ambulatory Visit: Payer: Self-pay | Admitting: Family Medicine

## 2014-07-25 NOTE — Telephone Encounter (Signed)
Patient has scheduled upcoming labs and CPE in 1 month.

## 2014-08-07 DIAGNOSIS — H25012 Cortical age-related cataract, left eye: Secondary | ICD-10-CM | POA: Diagnosis not present

## 2014-08-07 DIAGNOSIS — H35342 Macular cyst, hole, or pseudohole, left eye: Secondary | ICD-10-CM | POA: Diagnosis not present

## 2014-08-07 DIAGNOSIS — H35372 Puckering of macula, left eye: Secondary | ICD-10-CM | POA: Diagnosis not present

## 2014-08-07 DIAGNOSIS — H34819 Central retinal vein occlusion, unspecified eye: Secondary | ICD-10-CM | POA: Diagnosis not present

## 2014-08-07 DIAGNOSIS — H25011 Cortical age-related cataract, right eye: Secondary | ICD-10-CM | POA: Diagnosis not present

## 2014-08-07 DIAGNOSIS — H2512 Age-related nuclear cataract, left eye: Secondary | ICD-10-CM | POA: Diagnosis not present

## 2014-08-07 DIAGNOSIS — H2511 Age-related nuclear cataract, right eye: Secondary | ICD-10-CM | POA: Diagnosis not present

## 2014-08-24 ENCOUNTER — Encounter (INDEPENDENT_AMBULATORY_CARE_PROVIDER_SITE_OTHER): Payer: Medicare Other | Admitting: Ophthalmology

## 2014-08-24 DIAGNOSIS — H43813 Vitreous degeneration, bilateral: Secondary | ICD-10-CM

## 2014-08-24 DIAGNOSIS — H35033 Hypertensive retinopathy, bilateral: Secondary | ICD-10-CM

## 2014-08-24 DIAGNOSIS — I10 Essential (primary) hypertension: Secondary | ICD-10-CM | POA: Diagnosis not present

## 2014-08-24 DIAGNOSIS — H34811 Central retinal vein occlusion, right eye: Secondary | ICD-10-CM | POA: Diagnosis not present

## 2014-08-24 DIAGNOSIS — H35372 Puckering of macula, left eye: Secondary | ICD-10-CM

## 2014-08-24 DIAGNOSIS — H2513 Age-related nuclear cataract, bilateral: Secondary | ICD-10-CM | POA: Diagnosis not present

## 2014-09-02 ENCOUNTER — Other Ambulatory Visit: Payer: Self-pay | Admitting: Cardiovascular Disease

## 2014-09-18 ENCOUNTER — Other Ambulatory Visit: Payer: Self-pay

## 2014-09-21 ENCOUNTER — Other Ambulatory Visit: Payer: Medicare Other

## 2014-09-26 ENCOUNTER — Encounter: Payer: Medicare Other | Admitting: Family Medicine

## 2014-09-26 DIAGNOSIS — H2511 Age-related nuclear cataract, right eye: Secondary | ICD-10-CM | POA: Diagnosis not present

## 2014-10-06 ENCOUNTER — Telehealth: Payer: Self-pay | Admitting: Family Medicine

## 2014-10-06 ENCOUNTER — Other Ambulatory Visit (INDEPENDENT_AMBULATORY_CARE_PROVIDER_SITE_OTHER): Payer: Medicare Other

## 2014-10-06 DIAGNOSIS — E7800 Pure hypercholesterolemia, unspecified: Secondary | ICD-10-CM

## 2014-10-06 LAB — COMPREHENSIVE METABOLIC PANEL
ALK PHOS: 86 U/L (ref 39–117)
ALT: 11 U/L (ref 0–53)
AST: 13 U/L (ref 0–37)
Albumin: 4 g/dL (ref 3.5–5.2)
BILIRUBIN TOTAL: 1.2 mg/dL (ref 0.2–1.2)
BUN: 23 mg/dL (ref 6–23)
CALCIUM: 9.2 mg/dL (ref 8.4–10.5)
CO2: 26 meq/L (ref 19–32)
Chloride: 104 mEq/L (ref 96–112)
Creatinine, Ser: 1.21 mg/dL (ref 0.40–1.50)
GFR: 62.35 mL/min (ref >60.00)
GLUCOSE: 133 mg/dL — AB (ref 70–99)
POTASSIUM: 4.5 meq/L (ref 3.5–5.1)
Sodium: 138 mEq/L (ref 135–145)
Total Protein: 6.8 g/dL (ref 6.0–8.3)

## 2014-10-06 LAB — LIPID PANEL
CHOL/HDL RATIO: 4
Cholesterol: 113 mg/dL (ref 0–200)
HDL: 31 mg/dL — AB (ref >39.00)
LDL Cholesterol: 63 mg/dL (ref 0–99)
NonHDL: 82
TRIGLYCERIDES: 96 mg/dL (ref 0.0–149.0)
VLDL: 19.2 mg/dL (ref 0.0–40.0)

## 2014-10-06 NOTE — Telephone Encounter (Signed)
-----   Message from Ellamae Sia sent at 09/28/2014 11:02 AM EDT ----- Regarding: Lab orders for Friday,7.15.16 Patient is scheduled for CPX labs, please order future labs, Thanks , Karna Christmas

## 2014-10-12 ENCOUNTER — Ambulatory Visit (INDEPENDENT_AMBULATORY_CARE_PROVIDER_SITE_OTHER): Payer: Medicare Other | Admitting: Family Medicine

## 2014-10-12 ENCOUNTER — Encounter: Payer: Self-pay | Admitting: Family Medicine

## 2014-10-12 VITALS — BP 92/60 | HR 72 | Temp 98.6°F | Ht 70.25 in | Wt 219.2 lb

## 2014-10-12 DIAGNOSIS — Z23 Encounter for immunization: Secondary | ICD-10-CM | POA: Diagnosis not present

## 2014-10-12 DIAGNOSIS — Z0000 Encounter for general adult medical examination without abnormal findings: Secondary | ICD-10-CM

## 2014-10-12 DIAGNOSIS — I10 Essential (primary) hypertension: Secondary | ICD-10-CM

## 2014-10-12 DIAGNOSIS — R739 Hyperglycemia, unspecified: Secondary | ICD-10-CM

## 2014-10-12 DIAGNOSIS — I2510 Atherosclerotic heart disease of native coronary artery without angina pectoris: Secondary | ICD-10-CM | POA: Diagnosis not present

## 2014-10-12 DIAGNOSIS — E11319 Type 2 diabetes mellitus with unspecified diabetic retinopathy without macular edema: Secondary | ICD-10-CM | POA: Insufficient documentation

## 2014-10-12 DIAGNOSIS — Z7189 Other specified counseling: Secondary | ICD-10-CM | POA: Insufficient documentation

## 2014-10-12 DIAGNOSIS — E7800 Pure hypercholesterolemia, unspecified: Secondary | ICD-10-CM

## 2014-10-12 DIAGNOSIS — I509 Heart failure, unspecified: Secondary | ICD-10-CM

## 2014-10-12 LAB — HEMOGLOBIN A1C: Hgb A1c MFr Bld: 6.6 % — ABNORMAL HIGH (ref 4.6–6.5)

## 2014-10-12 NOTE — Progress Notes (Signed)
I have personally reviewed the Medicare Annual Wellness questionnaire and have noted 1. The patient's medical and social history 2. Their use of alcohol, tobacco or illicit drugs 3. Their current medications and supplements 4. The patient's functional ability including ADL's, fall risks, home safety risks and hearing or visual             impairment. 5. Diet and physical activities 6. Evidence for depression or mood disorders 7.         Updated provider list Cognitive evaluation was performed and recorded on Dominic Kane medicare questionnaire form. The patients weight, height, BMI and visual acuity have been recorded in the chart  I have made referrals, counseling and provided education to the patient based review of the above and I have provided the Dominic Kane with a written personalized care plan for preventive services.   Hypertension: At goal on current meds.  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 Glennie Hawk Seen last 11/2013 LVEF has been noted to be around 45% on studies in 2010, 2011. Echo September 2013 suggested LVEF was around 30%. Cardiac MRI December 2014 showed mild enlargement of the aortic root (4.2 cm) and and EF of 44%. Last stress test 9/13 without ischemia.   BBlocker contraindicated as BP too low.  Elevated Cholesterol: LDL at goal on low dose crestor.  Lab Results  Component Value Date   CHOL 113 10/06/2014   HDL 31.00* 10/06/2014   LDLCALC 63 10/06/2014   TRIG 96.0 10/06/2014   CHOLHDL 4 10/06/2014  Using medications without problems:None Muscle aches:none Diet compliance:moderate Exercise: walking at work , no other  exercise. Other complaints: Dominic Kane has lost 6 lbs in last 6 months.    Wt Readings from Last 3 Encounters:  10/12/14 219 lb 4 oz (99.451 kg)  05/01/14 225 lb (102.059 kg)  04/21/14 225 lb 6.4 oz (102.241 kg)   Hyperglycemia:  Possible new diabetes but Dominic Kane had some apple juice 1 hour prior with meds before tests. Has been some more thirsty lately, no change in urinary frequency.    Review of Systems  Constitutional: Negative for fever, fatigue and unexpected weight change.  HENT: Negative for ear pain, congestion, sore throat, rhinorrhea, trouble swallowing and postnasal drip.  Eyes: Negative for pain.  Respiratory: Negative for cough, shortness of breath and wheezing.  Cardiovascular: Negative for chest pain, palpitations and leg swelling.  Gastrointestinal: Negative for nausea, abdominal pain, diarrhea, constipation and blood in stool.  Genitourinary: Negative for dysuria, urgency, hematuria, discharge, penile swelling, scrotal swelling, difficulty urinating, penile pain and testicular pain.  Musculoskeletal: negative Skin: Negative for rash.  Neurological: Negative for syncope, weakness, light-headedness, numbness and headaches.  Psychiatric/Behavioral:  Negative for behavioral problems and dysphoric mood. The patient is not nervous/anxious.       Objective:   Physical Exam  Constitutional: Dominic Kane appears well-developed and well-nourished. Non-toxic appearance. Dominic Kane does not appear ill. No distress.  HENT:  Head: Normocephalic and atraumatic.  Right Ear: Hearing, tympanic membrane, external ear and ear canal normal.  Left Ear: Hearing, tympanic membrane, external ear and ear canal normal.  Nose: Nose normal.  Mouth/Throat: Uvula is midline, oropharynx is clear and moist and mucous membranes are normal.  Eyes: Conjunctivae, EOM and lids are normal. Pupils are equal, round, and reactive to light. No foreign bodies found.  Neck: Trachea normal, normal range of motion and phonation  normal. Neck supple. Carotid bruit is not present. No mass and no thyromegaly present.  Cardiovascular: Normal rate, regular rhythm, S1 normal, S2 normal, intact distal pulses and normal pulses. Exam reveals no gallop.  No murmur heard. Pulmonary/Chest: Breath sounds normal. Dominic Kane has no wheezes. Dominic Kane has no rhonchi. Dominic Kane has no rales.  Abdominal: Soft. Normal appearance and bowel sounds are normal. There is no hepatosplenomegaly. There is no tenderness. There is no rebound, no guarding and no CVA tenderness. No hernia. Hernia confirmed negative in the right inguinal area and confirmed negative in the left inguinal area.  Genitourinary: Not performed. Musculoskeletal:   Thoracic back: Normal.   Lumbar back: Normal. Dominic Kane exhibits normal range of motion, no tenderness, no bony tenderness and no swelling.  Lymphadenopathy:   Dominic Kane has no cervical adenopathy.   Right: No inguinal adenopathy present.   Left: No inguinal adenopathy present.  Neurological: Dominic Kane is alert. Dominic Kane has normal strength and normal reflexes. No cranial nerve deficit or sensory deficit. Gait normal.  Skin: Skin is warm, dry and intact. No rash noted.  Psychiatric: Dominic Kane has a normal mood and affect. His speech is normal and behavior is normal. Judgment normal.          Assessment & Plan:  The patient's preventative maintenance and recommended screening tests for an annual wellness exam were reviewed in full today. Brought up to date unless services declined.  Counselled on the importance of diet, exercise, and its role in overall health and mortality. The patient's FH and SH was reviewed, including their home life, tobacco status, and drug and alcohol status.   Vaccines:Uptodate with PNA, [facility]. Now due for prevnar given onsider Tdap. 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 Carlean Purl, repeat in 5 years . Former smoker 20 pipe, quit 35 years ago.  Prostate eval not indicated.

## 2014-10-12 NOTE — Progress Notes (Signed)
Pre visit review using our clinic review tool, if applicable. No additional management support is needed unless otherwise documented below in the visit note. 

## 2014-10-12 NOTE — Assessment & Plan Note (Signed)
Euvolemic. BBlocker contraindicated given low BP.

## 2014-10-12 NOTE — Assessment & Plan Note (Signed)
Well controlled. Continue current medication.  

## 2014-10-12 NOTE — Assessment & Plan Note (Signed)
Likely prediabetes but given elevated CBG > 126.. eval with A1C. Work on low carb diet, increase exercise and continue weight loss.

## 2014-10-12 NOTE — Patient Instructions (Addendum)
Work on regular exercise as able and Teacher, adult education fats in diet for veggies fats.  Low carb diet.  Stop at lab on way pout for HG A1C.  Look into tdap coverage (tetanus)  Given prevnar today.

## 2014-10-12 NOTE — Addendum Note (Signed)
Addended by: Carter Kitten on: 10/12/2014 02:48 PM   Modules accepted: Orders

## 2014-10-13 ENCOUNTER — Telehealth: Payer: Self-pay | Admitting: Family Medicine

## 2014-10-13 DIAGNOSIS — E119 Type 2 diabetes mellitus without complications: Secondary | ICD-10-CM

## 2014-10-13 NOTE — Telephone Encounter (Signed)
-----   Message from Carter Kitten, [place] sent at 10/12/2014  5:03 PM EDT ----- Mr. Fread notified as instructed by telephone.  He is agreeable to the nutritionist referral.

## 2014-10-18 DIAGNOSIS — H25012 Cortical age-related cataract, left eye: Secondary | ICD-10-CM | POA: Diagnosis not present

## 2014-10-23 ENCOUNTER — Encounter: Payer: Self-pay | Admitting: Dietician

## 2014-10-23 ENCOUNTER — Encounter: Payer: Medicare Other | Attending: Family Medicine | Admitting: Dietician

## 2014-10-23 VITALS — BP 108/61 | Ht 73.0 in | Wt 220.3 lb

## 2014-10-23 DIAGNOSIS — E119 Type 2 diabetes mellitus without complications: Secondary | ICD-10-CM | POA: Diagnosis not present

## 2014-10-23 NOTE — Progress Notes (Signed)
Diabetes Self-Management Education  Visit Type: First/Initial  Appt. Start Time: 1600 Appt. End Time: 1700  10/23/2014  [name], identified by name and date of birth, is a 74 y.o. male with a diagnosis of Diabetes: Type 2.  Other people present during visit:  Spouse/SO   ASSESSMENT  Blood pressure 108/61, height 6\' 1"  (1.854 m), weight 220 lb 4.8 oz (99.927 kg). Body mass index is 29.07 kg/(m^2).  Overweight Lacks knowledge of diabetes care and diet  Initial Visit Information:  Are you currently following a meal plan?: Yes (decreased intake of salt and fried foods)  Needs dental exam Is not checking BG's at home-does not have a meter     Are you checking your feet?: No        Psychosocial:     Patient Belief/Attitude about Diabetes: Motivated to manage diabetes Self-care barriers: None Self-management support: Family Other persons present: Spouse/SO Patient Concerns: Weight Control, Healthy Lifestyle Preferred Learning Style: Visual, Hands on Learning Readiness: Ready  Complications:   Last HgB A1C per patient/outside source: 6.6 mg/dL (10-06-14) How often do you check your blood sugar?: 0 times/day (not testing) Have you had a dilated eye exam in the past 12 months?: Yes Have you had a dental exam in the past 12 months?: No (2011)  Diet Intake:  Snack (afternoon):  (eats afternoon snack of fruit) Snack (evening):  (does not eat a bedtime snack) Beverage(s):  (drinks apple juuice 1x/day and drinks sweet tea 2-3x/day)  Exercise:  Exercise: Light (walking / raking leaves) (walks 1-3 hrs 5x/wk at work)  Individualized Plan for Diabetes Self-Management Training:   Learning Objective:  Patient will have a greater understanding of diabetes self-management.  Patient education plan per assessed needs and concerns is to attend individual sessions for     Education Topics Reviewed with Patient Today:  Definition of diabetes, type 1 and 2, and the  diagnosis of diabetes Role of diet in the treatment of diabetes and the relationship between the three main macronutrients and blood glucose level, Food label reading, portion sizes and measuring food., Carbohydrate counting Role of exercise on diabetes management, blood pressure control and cardiac health.   Taught/evaluated SMBG meter., Purpose and frequency of SMBG., Identified appropriate SMBG and/or A1C goals., Yearly dilated eye exam (gave pt an  Ultra One Touch 2 meter )   Relationship between chronic complications and blood glucose control, Dental care Role of stress on diabetes   Lifestyle issues that need to be addressed for better diabetes care  PATIENTS GOALS/Plan (Developed by the patient): Become more fit Lose weight Prevent diabetes complications Lead healthier lifestyle      Plan:   Patient Instructions   Check blood sugars 2 x day before breakfast and 2 hrs after supper every other day  Exercise: continue walking 1-3 hr  5 days a week  Limit intake of fruit juice to only 4 oz. At a time and decrease intake of sweet tea (try decreasing amount of sugar added to tea)  Eat 3 meals day,   1-2  snacks a day (bedtime + mid-afternoon if desires)  Space meals 4-6 hours apart  Make a  dentist  appointment  Bring blood sugar records to the next appointment/class  Call your doctor for a prescription for:  1. Meter strips (type)  Ultra One Touch test strips  checking  2 times per day  2. Lancets (type)  One Touch Delica Lancets  checking  2   times per day  Get a Sharps container   Return for appointment/classes on:  10-30-14   Expected Outcomes:  Demonstrated interest in learning. Expect positive outcomes  Education material provided: General meal planning guidelines, Ultra One Touch 2 meter  If problems or questions, patient to contact team via: [phone]  Future DSME appointment:

## 2014-10-23 NOTE — Patient Instructions (Signed)
  Check blood sugars 2 x day before breakfast and 2 hrs after supper every other day  Exercise: continue walking 1-3 hr  5 days a week  Limit intake of fruit juice to only 4 oz. At a time and decrease intake of sweet tea (try decreasing amount of sugar added to tea)  Eat 3 meals day,   1-2  snacks a day (bedtime + mid-afternoon if desires)  Space meals 4-6 hours apart  Make a  dentist  appointment  Bring blood sugar records to the next appointment/class  Call your doctor for a prescription for:  1. Meter strips (type)  Ultra One Touch test strips  checking  2 times per day  2. Lancets (type)  One Touch Delica Lancets  checking  2   times per day  Get a Sharps container   Return for appointment/classes on:  10-30-14

## 2014-10-24 ENCOUNTER — Telehealth: Payer: Self-pay

## 2014-10-24 DIAGNOSIS — H2512 Age-related nuclear cataract, left eye: Secondary | ICD-10-CM | POA: Diagnosis not present

## 2014-10-24 MED ORDER — GLUCOSE BLOOD VI STRP
ORAL_STRIP | Status: DC
Start: 2014-10-24 — End: 2016-03-07

## 2014-10-24 MED ORDER — ONETOUCH DELICA LANCETS 33G MISC: Status: DC

## 2014-10-24 NOTE — Telephone Encounter (Signed)
Are we talking about Mr. Dimaio or Mrs. Procell.  Who needs the test strips and lancets?  Looks like she does but phone note is in Mr. Odenthal chart.  Please clarify.

## 2014-10-24 NOTE — Telephone Encounter (Signed)
Mrs Dominic Kane left v/m; pt is Dominic Kane and Dominic Kane is the one who saw nutritionist and needs diabetic supplies. See nutritionist note on 10/23/14.

## 2014-10-24 NOTE — Telephone Encounter (Signed)
Prescription for Test Strips and Lancets sent to [facility] [address]

## 2014-10-24 NOTE — Telephone Encounter (Addendum)
Mrs Sforza left v/m; pt went to see nutritionist and pt was advised to get one touch ultra test strips and one touch delica lancets, checking BS twice a day.[facility] [address].Mrs Fuller wants to know if there is testing that can be done to see if pt has pancreatic cancer. pts father was diagnosed with diabetes. pts father developed pneumonia and pt's father was found to have lung cancer and after autopsy found CA originated in pancreas. Mrs Crass is fearful that pt is worried about having diabetes now and possibly getting cancer like his father did. Mrs Eyster request cb.

## 2014-10-30 ENCOUNTER — Encounter: Payer: Medicare Other | Admitting: Dietician

## 2014-10-30 VITALS — Ht 70.25 in | Wt 215.2 lb

## 2014-10-30 DIAGNOSIS — E119 Type 2 diabetes mellitus without complications: Secondary | ICD-10-CM

## 2014-10-30 NOTE — Progress Notes (Signed)

## 2014-10-31 DIAGNOSIS — Z1283 Encounter for screening for malignant neoplasm of skin: Secondary | ICD-10-CM | POA: Diagnosis not present

## 2014-10-31 DIAGNOSIS — B079 Viral wart, unspecified: Secondary | ICD-10-CM | POA: Diagnosis not present

## 2014-11-06 ENCOUNTER — Encounter (INDEPENDENT_AMBULATORY_CARE_PROVIDER_SITE_OTHER): Payer: Medicare Other | Admitting: Ophthalmology

## 2014-11-06 ENCOUNTER — Encounter: Payer: Self-pay | Admitting: Dietician

## 2014-11-06 ENCOUNTER — Encounter: Payer: Medicare Other | Admitting: Dietician

## 2014-11-06 DIAGNOSIS — I10 Essential (primary) hypertension: Secondary | ICD-10-CM

## 2014-11-06 DIAGNOSIS — H35372 Puckering of macula, left eye: Secondary | ICD-10-CM

## 2014-11-06 DIAGNOSIS — H35033 Hypertensive retinopathy, bilateral: Secondary | ICD-10-CM | POA: Diagnosis not present

## 2014-11-06 DIAGNOSIS — E119 Type 2 diabetes mellitus without complications: Secondary | ICD-10-CM | POA: Diagnosis not present

## 2014-11-06 DIAGNOSIS — H34811 Central retinal vein occlusion, right eye: Secondary | ICD-10-CM | POA: Diagnosis not present

## 2014-11-06 DIAGNOSIS — H43813 Vitreous degeneration, bilateral: Secondary | ICD-10-CM

## 2014-11-06 NOTE — Progress Notes (Signed)

## 2014-11-13 ENCOUNTER — Encounter: Payer: Medicare Other | Admitting: Dietician

## 2014-11-13 VITALS — BP 110/70 | Ht 73.0 in | Wt 211.9 lb

## 2014-11-13 DIAGNOSIS — E119 Type 2 diabetes mellitus without complications: Secondary | ICD-10-CM

## 2014-11-13 NOTE — Progress Notes (Signed)

## 2014-11-14 ENCOUNTER — Encounter: Payer: Self-pay | Admitting: Dietician

## 2014-11-17 DIAGNOSIS — Z23 Encounter for immunization: Secondary | ICD-10-CM | POA: Diagnosis not present

## 2014-12-05 ENCOUNTER — Other Ambulatory Visit: Payer: Self-pay | Admitting: Cardiology

## 2014-12-12 LAB — HM DIABETES EYE EXAM

## 2015-01-11 ENCOUNTER — Ambulatory Visit (INDEPENDENT_AMBULATORY_CARE_PROVIDER_SITE_OTHER): Payer: Medicare Other | Admitting: Family Medicine

## 2015-01-11 ENCOUNTER — Encounter: Payer: Self-pay | Admitting: Family Medicine

## 2015-01-11 VITALS — BP 124/66 | HR 59 | Temp 98.4°F | Ht 70.25 in | Wt 208.5 lb

## 2015-01-11 DIAGNOSIS — I2510 Atherosclerotic heart disease of native coronary artery without angina pectoris: Secondary | ICD-10-CM | POA: Diagnosis not present

## 2015-01-11 DIAGNOSIS — B353 Tinea pedis: Secondary | ICD-10-CM | POA: Diagnosis not present

## 2015-01-11 DIAGNOSIS — E119 Type 2 diabetes mellitus without complications: Secondary | ICD-10-CM

## 2015-01-11 LAB — HM DIABETES FOOT EXAM

## 2015-01-11 NOTE — Patient Instructions (Addendum)
Can try topical fungal cream on feet. Spray shoes with antifungal. Keep up great work on lifestyle changes.  Stop at lab on way out.

## 2015-01-11 NOTE — Progress Notes (Signed)
Pre visit review using our clinic review tool, if applicable. No additional management support is needed unless otherwise documented below in the visit note. 

## 2015-01-11 NOTE — Progress Notes (Signed)
   Subjective:    Patient ID: Dominic Kane, male    DOB: 07/23/1940, 74 y.o.   MRN: [id number]  HPI  74 year old male presents for follow up new diagnosis DM.   At last OV in 10/12/2014 A1C was 6.6. Referred to nutritionist at that time. He  Lost 20 lbs.. He has started decreased calorie diet. Stop sugar. Using medications without difficulties:None Hypoglycemic episodes:None Hyperglycemic episodes:None Feet problems:none Blood Sugars averaging: FBS 95-102 eye exam within last year: yes  Wt Readings from Last 3 Encounters:  01/11/15 208 lb 8 oz (94.575 kg)  11/13/14 211 lb 14.4 oz (96.117 kg)  11/06/14 212 lb 11.2 oz (96.48 kg)   Thirst improved.  Review of Systems  Constitutional: Negative for fever and fatigue.  HENT: Negative for ear pain.   Eyes: Negative for pain.  Respiratory: Negative for cough and shortness of breath.   Cardiovascular: Negative for chest pain, palpitations and leg swelling.  Gastrointestinal: Negative for abdominal pain.  Genitourinary: Negative for dysuria.  Musculoskeletal: Negative for arthralgias.  Neurological: Negative for syncope, light-headedness and headaches.  Psychiatric/Behavioral: Negative for dysphoric mood.       Objective:   Physical Exam  Constitutional: Vital signs are normal. He appears well-developed and well-nourished.  HENT:  Head: Normocephalic.  Right Ear: Hearing normal.  Left Ear: Hearing normal.  Nose: Nose normal.  Mouth/Throat: Oropharynx is clear and moist and mucous membranes are normal.  Neck: Trachea normal. Carotid bruit is not present. No thyroid mass and no thyromegaly present. . Cardiovascular: Normal rate, regular rhythm and normal pulses.  Exam reveals no gallop, no distant heart sounds and no friction rub.   Murmur heard. No peripheral edema  Pulmonary/Chest: Effort normal and breath sounds normal. No respiratory distress.  Skin: Skin is warm, dry and intact. No rash noted.  Psychiatric: He has a  normal mood and affect. His speech is normal and behavior is normal. Thought content normal.   Diabetic foot exam:  inspection: dry flaky skin on feet, cracking, peeling in circular patches, possible tinea infeciton No skin breakdown Mild calluses  Normal DP pulses Normal sensation to light touch and monofilament Nails thickened        Assessment & Plan:

## 2015-01-11 NOTE — Assessment & Plan Note (Signed)
Topical OTC treatment to decrease cracking and risk of bacterial superinfection in setting of DM.

## 2015-01-11 NOTE — Assessment & Plan Note (Signed)
Excellent improvement in control, check A1C , with lifestyle changes and weight loss.

## 2015-01-12 ENCOUNTER — Encounter: Payer: Self-pay | Admitting: *Deleted

## 2015-01-12 LAB — HEMOGLOBIN A1C: Hgb A1c MFr Bld: 6.4 % (ref 4.6–6.5)

## 2015-01-24 ENCOUNTER — Encounter (INDEPENDENT_AMBULATORY_CARE_PROVIDER_SITE_OTHER): Payer: Medicare Other | Admitting: Ophthalmology

## 2015-01-26 ENCOUNTER — Other Ambulatory Visit: Payer: Self-pay | Admitting: Family Medicine

## 2015-01-26 ENCOUNTER — Other Ambulatory Visit: Payer: Self-pay | Admitting: Cardiovascular Disease

## 2015-01-29 ENCOUNTER — Encounter (INDEPENDENT_AMBULATORY_CARE_PROVIDER_SITE_OTHER): Payer: Medicare Other | Admitting: Ophthalmology

## 2015-01-29 DIAGNOSIS — H43813 Vitreous degeneration, bilateral: Secondary | ICD-10-CM | POA: Diagnosis not present

## 2015-01-29 DIAGNOSIS — H348111 Central retinal vein occlusion, right eye, with retinal neovascularization: Secondary | ICD-10-CM | POA: Diagnosis not present

## 2015-01-29 DIAGNOSIS — H34811 Central retinal vein occlusion, right eye, with macular edema: Secondary | ICD-10-CM

## 2015-01-29 DIAGNOSIS — I10 Essential (primary) hypertension: Secondary | ICD-10-CM

## 2015-01-29 DIAGNOSIS — H35372 Puckering of macula, left eye: Secondary | ICD-10-CM

## 2015-01-29 DIAGNOSIS — H35033 Hypertensive retinopathy, bilateral: Secondary | ICD-10-CM

## 2015-03-13 ENCOUNTER — Other Ambulatory Visit: Payer: Self-pay | Admitting: Cardiovascular Disease

## 2015-03-27 ENCOUNTER — Other Ambulatory Visit: Payer: Self-pay | Admitting: Cardiology

## 2015-04-16 ENCOUNTER — Encounter (INDEPENDENT_AMBULATORY_CARE_PROVIDER_SITE_OTHER): Payer: Medicare Other | Admitting: Ophthalmology

## 2015-04-25 ENCOUNTER — Other Ambulatory Visit: Payer: Self-pay | Admitting: Cardiovascular Disease

## 2015-04-25 ENCOUNTER — Encounter (INDEPENDENT_AMBULATORY_CARE_PROVIDER_SITE_OTHER): Payer: Medicare Other | Admitting: Ophthalmology

## 2015-04-25 DIAGNOSIS — H35033 Hypertensive retinopathy, bilateral: Secondary | ICD-10-CM

## 2015-04-25 DIAGNOSIS — H35372 Puckering of macula, left eye: Secondary | ICD-10-CM

## 2015-04-25 DIAGNOSIS — H34811 Central retinal vein occlusion, right eye, with macular edema: Secondary | ICD-10-CM | POA: Diagnosis not present

## 2015-04-25 DIAGNOSIS — H43813 Vitreous degeneration, bilateral: Secondary | ICD-10-CM | POA: Diagnosis not present

## 2015-04-25 DIAGNOSIS — I10 Essential (primary) hypertension: Secondary | ICD-10-CM | POA: Diagnosis not present

## 2015-04-25 NOTE — Telephone Encounter (Signed)
Ok to refill 

## 2015-04-25 NOTE — Telephone Encounter (Signed)
Please ask primary care to refill 

## 2015-04-26 ENCOUNTER — Other Ambulatory Visit: Payer: Self-pay | Admitting: Cardiovascular Disease

## 2015-05-22 ENCOUNTER — Other Ambulatory Visit: Payer: Self-pay | Admitting: Cardiovascular Disease

## 2015-05-22 NOTE — Progress Notes (Signed)
i  Chief Complaint  Patient presents with  . Follow-up    History of Present Illness: 75 yo white male with a past medical history significant for diastolic and systolic CHF, hypertension, nonobstructive coronary artery disease, idiopathic cardiomyopathy, benign prostatic hypertrophy, hyperlipidemia and hiatal hernia, who presents today for routine cardiac followup. Dominic Kane had an episode of chest pain about 7 years ago; at which time, he was evaluated by the [facility] in [place], [place]. He was apparently known to have an ejection fraction of 35-40%, which was felt to be idiopathic. Left heart catheterization was performed in May 2005 and showed diffuse 15% irregularities in the LAD, diffuse 20% irregularities in the ramus intermedius branch, diffuse 30% irregularities in the nondominant circumflex, and a 50% stenosis in the proximal RCA with irregularities in the midportion of the vessel. The ascending aorta was mildly dilated with no evidence of dissection during that catheterization. Ejection fraction was noted to be 65%. An echocardiogram during that admission in May 2005 in [place] showed an ejection fraction of 55-60% with the ascending aorta at the sinotubular junction measuring 4.1 cm. LVEF has been noted to be around 45% on studies in 2010, 2011. Echo September 2013 suggested LVEF was around 30%. Cardiac MRI December 2014 showed mild enlargement of the aortic root (4.2 cm) and and EF of 44%. Last stress test 9/13 without ischemia.   He is here today for follow up. He denies chest pain. He has occasional dyspnea but this has not changed. No awareness of palpitations, near syncope, syncope, orthopnea, PND, LE edema. He is very active.   Primary Care Physician: Eliezer Lofts  Past Medical History  Diagnosis Date  . Coronary atherosclerosis of native coronary artery   . Acute sinusitis, unspecified   . 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 Unspecified vitamin D  deficiency   . Dysfunction of eustachian tube   . Abnormality of gait   . Aortic valve disorders   . Sinoatrial node dysfunction (HCC)   . Lipoprotein deficiencies   . Other primary cardiomyopathies   . Unspecified essential hypertension   . Other and unspecified hyperlipidemia   . Displacement of lumbar intervertebral disc without myelopathy   . Unspecified disorder of skin and subcutaneous tissue   . Benign paroxysmal positional vertigo   . Headache(784.0)   . Hypertrophy of prostate without urinary obstruction and other lower urinary tract symptoms (LUTS)   . Pain in joint, lower leg   . Pes anserinus tendinitis or bursitis   . Thoracic or lumbosacral neuritis or radiculitis, unspecified   . Personal history of other diseases of digestive system   . Personal history of colonic polyps   . Diverticulosis of colon (without mention of hemorrhage)   . Allergy   . CHF (congestive heart failure) ([place])   . GERD (gastroesophageal reflux disease)   . Cataract   . Enlarged prostate   . 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 Knee surgery Left 1947  . Esophagogastroduodenoscopy  11/07    Schatzki's ring, non bleeding erosive gastropathy [place]  . Dobutamine stress echo  1/07    Lateral hypokinesis but no ischemia  . Pulmonary functioning tests  1. 2003  2. 2005    1. Diminished lung capacity  2. Stable    Current Outpatient Prescriptions  Medication Sig Dispense Refill  . aspirin 81 MG tablet Take 81 mg by mouth daily.      [name]  Cetirizine HCl (KLS ALLER-TEC PO) Take 10 mg by mouth daily.     . CRESTOR 10 MG tablet TAKE ONE TABLET BY MOUTH ONE TIME DAILY 90 tablet 0  . finasteride (PROSCAR) 5 MG tablet TAKE ONE TABLET BY MOUTH ONE TIME DAILY  30 tablet 11  . furosemide (LASIX) 40 MG tablet TAKE ONE TABLET BY MOUTH ONE TIME DAILY 30 tablet 2  . glucose blood (ONE TOUCH ULTRA TEST) test strip Use to check blood sugar two  times a day.  Dx: E11.9 100 each 5  . Melatonin 5 MG TABS Take 5 mg by mouth at bedtime. As needed for sleep    . omeprazole (PRILOSEC) 40 MG capsule TAKE ONE CAPSULE BY MOUTH ONE TIME DAILY 30 capsule 11  . ONETOUCH DELICA LANCETS 99991111 MISC Use to check blood sugar two times a day.  Dx: E11.9 100 each 5  . ramipril (ALTACE) 2.5 MG capsule TAKE ONE CAPSULE BY MOUTH ONE TIME DAILY 30 capsule 5  . terazosin (HYTRIN) 10 MG capsule TAKE ONE CAPSULE BY MOUTH ONE TIME DAILY 90 capsule 1   No current facility-administered medications for this visit.    Allergies  Allergen Reactions  . Codeine     REACTION: nausea  . Sulfonamide Derivatives     REACTION: hives    Social History   Social History  . Marital Status: Married    Spouse Name: N/A  . Number of Children: 1  . Years of Education: N/A   Occupational History  . retired Teacher, English as a foreign language    Social History Main Topics  . Smoking status: Former Smoker    Quit date: 07/03/1980  . Smokeless tobacco: Never Used  . Alcohol Use: 0.6 - 1.2 oz/week    1-2 Glasses of wine, 0 Standard drinks or equivalent per week  . Drug Use: No  . Sexual Activity: Not on file   Other Topics Concern  . Not on file   Social History Narrative    Family History  Problem Relation Age of Onset  . Heart failure Mother   . Coronary artery disease Mother   . Stroke Mother   . Cancer Father     LUNG  . Cancer Sister     BREAST  . Heart failure Maternal Grandmother   . Colon cancer Neg Hx     Review of Systems:  As stated in the HPI and otherwise negative.   BP 108/72 mmHg  Pulse 54  Ht 5\' 10"  (1.778 m)  Wt 204 lb 12.8 oz (92.897 kg)  BMI 29.39 kg/m2  SpO2 93%  Physical Examination: General: Well developed, well nourished, NAD HEENT: OP clear, mucus membranes moist SKIN: warm, dry. No rashes. Neuro: No focal deficits Musculoskeletal: Muscle strength 5/5 all ext Psychiatric: Mood and affect normal Neck: No JVD, no carotid bruits, no  thyromegaly, no lymphadenopathy. Lungs:Clear bilaterally, no wheezes, rhonci, crackles Cardiovascular: Regular rate and rhythm. No murmurs, gallops or rubs. Abdomen:Soft. Bowel sounds present. Non-tender.  Extremities: No lower extremity edema. Pulses are 2 + in the bilateral DP/PT.  Echo 12/10/11: Left ventricle: The cavity size was normal. Wall thickness was increased in a pattern of mild LVH. The estimated ejection fraction was 30%. Diffuse hypokinesis. Doppler parameters are consistent with abnormal left ventricular relaxation (grade 1 diastolic dysfunction). - Aortic valve: There was no stenosis. Trivial regurgitation. - Aorta: Ascending aorta dimension: 43 mm. Mildly dilated aortic root and ascending aorta. Aortic root dimension:25mm (ED). - Mitral valve: Trivial regurgitation. - Left  atrium: The atrium was mildly dilated. - Right ventricle: The cavity size was normal. Systolic function was mildly to moderately reduced. - Tricuspid valve: Peak RV-RA gradient: 33mm Hg (S). - Pulmonary arteries: PA peak pressure: 67mm Hg (S). - Systemic veins: IVC measured 1.9 cm with some respirophasic variation, suggesting RA pressure 10 mmHg. Impressions:  - Normal LV size with mild LV hypertrophy. EF 30% with diffuse hypokinesis. Normal RV size with mild to moderate hypokinesis. No significant valvular abnormalities. Dilation of the aortic root and ascending aorta. Would consider MRA of the chest to further delineate the thoracic aorta.  Cardiac MRI 02/22/13: On limited views of the lung fields, there were no gross abnormalities. Normal left ventricular size with normal wall thickness. There was global hypokinesis with EF calculated to be 44%. The right ventricle was normal in size with mild systolic dysfunction. The left atrium was mildly dilated. The right atrium was normal in size. The aortic valve was trileaflet. It opened well, I do not think that there was significant aortic  stenosis. There was trivial aortic insufficiency. Significant mitral regurgitation was not noted.  On delayed enhancement imaging, there was no myocardial delayed enhancement.  On MRA of the chest, the aortic root and ascending aorta were mildly dilated. The arch and descending aorta were normal in caliber. The arch vessels originated normally. The pulmonary veins drained normally to the left atrium.  Measurements:  LV EDV 146 mL  LV SV 64 mL  LV EF 44%  Aortic root 4.2 cm  Ascending aorta 4.1 cm (maximal diameter)  Arch 3.3 cm  Descending thoracic aorta 3.0 cm  IMPRESSION: 1. Normal LV size with mild global hypokinesis, EF 44%. There was no myocardial delayed enhancement so no definitive evidence for prior myocardial infarction, infiltrative disease, or myocarditis.  2. Normal RV size with mildly decreased systolic function.  3. Mild aortic root (4.2 cm) and ascending thoracic aorta (4.1 cm) Dilation.  EKG:  EKG is ordered today. The ekg ordered today demonstrates Sinus brady, rate 54 bpm. RBBB. LAFB. LVH  Recent Labs: 10/06/2014: ALT 11; BUN 23; Creatinine, Ser 1.21; Potassium 4.5; Sodium 138   Lipid Panel    Component Value Date/Time   CHOL 113 10/06/2014 0825   TRIG 96.0 10/06/2014 0825   HDL 31.00* 10/06/2014 0825   CHOLHDL 4 10/06/2014 0825   VLDL 19.2 10/06/2014 0825   LDLCALC 63 10/06/2014 0825     Wt Readings from Last 3 Encounters:  05/23/15 204 lb 12.8 oz (92.897 kg)  01/11/15 208 lb 8 oz (94.575 kg)  11/13/14 211 lb 14.4 oz (96.117 kg)     Other studies Reviewed: Additional studies/ records that were reviewed today include: . Review of the above records demonstrates:    Assessment and Plan:   1. CAD: No chest pains. Appears stable. Normal stress test September 2013. He is known to have non-obstructive CAD by cath 2005. Continue ASA and statin.   2. Non-ischemic Cardiomyopathy: LVEF 44% by cardiac MRI 12/14. Continue Ace-inh. No beta  blocker with bradycardia.   3. Thoracic aortic aneurysm:  Last imaging December 2014 by MRI, stable.   Will repeat every three years since it has been stable since 2005. Will repeat December 2017.   4. HLD: Continue statin. Last LDL at goal July 2016.    5. Chronic systolic CHF: Volume status is ok today. Extra Lasix as needed for weight gain.   6. Carotid bruit, right: Carotid dopplers January 2016 with no evidence of carotid artery  disease.   Current medicines are reviewed at length with the patient today.  The patient does not have concerns regarding medicines.  The following changes have been made:  no change  Labs/ tests ordered today include:  No orders of the defined types were placed in this encounter.     Disposition:   FU with me in 9 months   Signed, Lauree Chandler, MD 05/23/2015 8:27 AM    [facility] [address], [place], [place]  [address] Phone: [phone]; Fax: [phone]

## 2015-05-23 ENCOUNTER — Ambulatory Visit (INDEPENDENT_AMBULATORY_CARE_PROVIDER_SITE_OTHER): Payer: Medicare Other | Admitting: Cardiovascular Disease

## 2015-05-23 ENCOUNTER — Encounter: Payer: Self-pay | Admitting: Cardiovascular Disease

## 2015-05-23 VITALS — BP 108/72 | HR 54 | Ht 70.0 in | Wt 204.8 lb

## 2015-05-23 DIAGNOSIS — I2510 Atherosclerotic heart disease of native coronary artery without angina pectoris: Secondary | ICD-10-CM | POA: Diagnosis not present

## 2015-05-23 DIAGNOSIS — I712 Thoracic aortic aneurysm, without rupture, unspecified: Secondary | ICD-10-CM

## 2015-05-23 DIAGNOSIS — I428 Other cardiomyopathies: Secondary | ICD-10-CM

## 2015-05-23 DIAGNOSIS — I5022 Chronic systolic (congestive) heart failure: Secondary | ICD-10-CM | POA: Diagnosis not present

## 2015-05-23 DIAGNOSIS — E785 Hyperlipidemia, unspecified: Secondary | ICD-10-CM

## 2015-05-23 DIAGNOSIS — I429 Cardiomyopathy, unspecified: Secondary | ICD-10-CM | POA: Diagnosis not present

## 2015-05-23 NOTE — Patient Instructions (Signed)
Medication Instructions:  Your physician recommends that you continue on your current medications as directed. Please refer to the Current Medication list given to you today.   Labwork: none  Testing/Procedures: none  Follow-Up: Your physician wants you to follow-up in: December.  You will receive a reminder letter in the mail two months in advance. If you don't receive a letter, please call our office to schedule the follow-up appointment. al  Any Other Special Instructions Will Be Listed Below (If Applicable).     If you need a refill on your cardiac medications before your next appointment, please call your pharmacy.

## 2015-05-26 ENCOUNTER — Other Ambulatory Visit: Payer: Self-pay | Admitting: Cardiovascular Disease

## 2015-05-30 ENCOUNTER — Other Ambulatory Visit: Payer: Self-pay | Admitting: Cardiovascular Disease

## 2015-05-30 NOTE — Telephone Encounter (Signed)
AGree. Dominic Kane

## 2015-05-30 NOTE — Telephone Encounter (Signed)
Please send to primary care

## 2015-05-30 NOTE — Telephone Encounter (Signed)
Can refill for one month since pt is out but further refills should come from primary care

## 2015-06-19 ENCOUNTER — Other Ambulatory Visit: Payer: Self-pay | Admitting: Cardiovascular Disease

## 2015-06-20 ENCOUNTER — Telehealth: Payer: Self-pay | Admitting: *Deleted

## 2015-06-20 ENCOUNTER — Other Ambulatory Visit: Payer: Self-pay

## 2015-06-20 MED ORDER — ROSUVASTATIN CALCIUM 10 MG PO TABS: 10.0000 mg | ORAL_TABLET | Freq: Every day | ORAL | Status: DC

## 2015-06-20 MED ORDER — FUROSEMIDE 40 MG PO TABS: 40.0000 mg | ORAL_TABLET | Freq: Every day | ORAL | Status: DC

## 2015-06-20 MED ORDER — RAMIPRIL 2.5 MG PO CAPS: 2.5000 mg | ORAL_CAPSULE | Freq: Every day | ORAL | Status: DC

## 2015-06-20 NOTE — Telephone Encounter (Signed)
Reyaansh Lynds's  wife called today to ask for a HARD copy prescription for Crestor 10 mg, Ramipril 2.5 mg, and lasix 40 mg for him. He is moving all his medicines to a 28 day supply company located within [facility].

## 2015-06-20 NOTE — Telephone Encounter (Signed)
Spoke with pt. He would like prescriptions mailed to him. Home address confirmed with pt. Will mail.

## 2015-06-20 NOTE — Telephone Encounter (Signed)
Dominic Kane said changing to mail order pharmacy and request printed rx for finasteride, omeprazole and terazosin. Call when ready for pick up. Last annual 10/12/14.

## 2015-06-20 NOTE — Telephone Encounter (Signed)
Left message to call back  

## 2015-06-21 ENCOUNTER — Other Ambulatory Visit: Payer: Self-pay | Admitting: Family Medicine

## 2015-06-21 MED ORDER — OMEPRAZOLE 40 MG PO CPDR: 40.0000 mg | DELAYED_RELEASE_CAPSULE | Freq: Every day | ORAL | Status: DC

## 2015-06-21 MED ORDER — TERAZOSIN HCL 10 MG PO CAPS: 10.0000 mg | ORAL_CAPSULE | Freq: Every day | ORAL | Status: DC

## 2015-06-21 MED ORDER — FINASTERIDE 5 MG PO TABS: 5.0000 mg | ORAL_TABLET | Freq: Every day | ORAL | Status: DC

## 2015-06-25 NOTE — Telephone Encounter (Signed)
Mrs. Livingston notified prescriptions would be ready to pick up on 06/22/15.

## 2015-07-18 ENCOUNTER — Other Ambulatory Visit: Payer: Self-pay | Admitting: Cardiovascular Disease

## 2015-07-18 ENCOUNTER — Other Ambulatory Visit: Payer: Self-pay | Admitting: Family Medicine

## 2015-07-22 ENCOUNTER — Other Ambulatory Visit: Payer: Self-pay | Admitting: Family Medicine

## 2015-07-23 ENCOUNTER — Encounter (INDEPENDENT_AMBULATORY_CARE_PROVIDER_SITE_OTHER): Payer: Medicare Other | Admitting: Ophthalmology

## 2015-07-23 DIAGNOSIS — I10 Essential (primary) hypertension: Secondary | ICD-10-CM

## 2015-07-23 DIAGNOSIS — H35033 Hypertensive retinopathy, bilateral: Secondary | ICD-10-CM | POA: Diagnosis not present

## 2015-07-23 DIAGNOSIS — H35372 Puckering of macula, left eye: Secondary | ICD-10-CM | POA: Diagnosis not present

## 2015-07-23 DIAGNOSIS — H34811 Central retinal vein occlusion, right eye, with macular edema: Secondary | ICD-10-CM | POA: Diagnosis not present

## 2015-07-23 DIAGNOSIS — H43813 Vitreous degeneration, bilateral: Secondary | ICD-10-CM

## 2015-07-25 ENCOUNTER — Other Ambulatory Visit: Payer: Self-pay | Admitting: Cardiovascular Disease

## 2015-07-27 ENCOUNTER — Other Ambulatory Visit: Payer: Self-pay | Admitting: *Deleted

## 2015-07-27 MED ORDER — FUROSEMIDE 40 MG PO TABS: 40.0000 mg | ORAL_TABLET | Freq: Every day | ORAL | Status: DC

## 2015-08-18 ENCOUNTER — Other Ambulatory Visit: Payer: Self-pay | Admitting: Cardiovascular Disease

## 2015-10-15 ENCOUNTER — Telehealth: Payer: Self-pay | Admitting: Family Medicine

## 2015-10-15 ENCOUNTER — Other Ambulatory Visit (INDEPENDENT_AMBULATORY_CARE_PROVIDER_SITE_OTHER): Payer: Medicare Other

## 2015-10-15 DIAGNOSIS — E7800 Pure hypercholesterolemia, unspecified: Secondary | ICD-10-CM

## 2015-10-15 LAB — COMPREHENSIVE METABOLIC PANEL
ALBUMIN: 4.4 g/dL (ref 3.5–5.2)
ALK PHOS: 78 U/L (ref 39–117)
ALT: 14 U/L (ref 0–53)
AST: 15 U/L (ref 0–37)
BUN: 18 mg/dL (ref 6–23)
CHLORIDE: 104 meq/L (ref 96–112)
CO2: 31 mEq/L (ref 19–32)
Calcium: 9.6 mg/dL (ref 8.4–10.5)
Creatinine, Ser: 1.02 mg/dL (ref 0.40–1.50)
GFR: 75.72 mL/min (ref >60.00)
Glucose, Bld: 111 mg/dL — ABNORMAL HIGH (ref 70–99)
POTASSIUM: 4.3 meq/L (ref 3.5–5.1)
Sodium: 141 mEq/L (ref 135–145)
TOTAL PROTEIN: 7 g/dL (ref 6.0–8.3)
Total Bilirubin: 1.1 mg/dL (ref 0.2–1.2)

## 2015-10-15 LAB — LIPID PANEL
CHOLESTEROL: 117 mg/dL (ref 0–200)
HDL: 38.5 mg/dL — AB (ref >39.00)
LDL CALC: 64 mg/dL (ref 0–99)
NonHDL: 78.5
Total CHOL/HDL Ratio: 3
Triglycerides: 75 mg/dL (ref 0.0–149.0)
VLDL: 15 mg/dL (ref 0.0–40.0)

## 2015-10-15 NOTE — Telephone Encounter (Signed)
-----   Message from Marchia Bond sent at 10/04/2015  6:30 PM EDT ----- Regarding: Cpx labs Mon 7/24, need orders. Thanks :-) Please order  future cpx labs for pt's upcoming lab appt. Thanks Aniceto Boss

## 2015-10-17 ENCOUNTER — Encounter: Payer: Self-pay | Admitting: Family Medicine

## 2015-10-17 ENCOUNTER — Ambulatory Visit (INDEPENDENT_AMBULATORY_CARE_PROVIDER_SITE_OTHER): Payer: Medicare Other | Admitting: Family Medicine

## 2015-10-17 ENCOUNTER — Ambulatory Visit (INDEPENDENT_AMBULATORY_CARE_PROVIDER_SITE_OTHER)
Admission: RE | Admit: 2015-10-17 | Discharge: 2015-10-17 | Disposition: A | Discharge status: Home / Self Care | Payer: Medicare Other | Source: Ambulatory Visit | Attending: Family Medicine | Admitting: Family Medicine

## 2015-10-17 VITALS — BP 100/64 | HR 62 | Temp 98.5°F | Ht 70.25 in | Wt 203.5 lb

## 2015-10-17 DIAGNOSIS — I2510 Atherosclerotic heart disease of native coronary artery without angina pectoris: Secondary | ICD-10-CM

## 2015-10-17 DIAGNOSIS — M179 Osteoarthritis of knee, unspecified: Secondary | ICD-10-CM | POA: Diagnosis not present

## 2015-10-17 DIAGNOSIS — I509 Heart failure, unspecified: Secondary | ICD-10-CM | POA: Diagnosis not present

## 2015-10-17 DIAGNOSIS — M1712 Unilateral primary osteoarthritis, left knee: Secondary | ICD-10-CM | POA: Diagnosis not present

## 2015-10-17 DIAGNOSIS — E119 Type 2 diabetes mellitus without complications: Secondary | ICD-10-CM

## 2015-10-17 DIAGNOSIS — M25462 Effusion, left knee: Secondary | ICD-10-CM | POA: Diagnosis not present

## 2015-10-17 MED ORDER — DICLOFENAC SODIUM 1 % TD GEL: 4.0000 g | Freq: Four times a day (QID) | TRANSDERMAL | 11 refills | Status: DC | PRN

## 2015-10-17 MED ORDER — METHYLPREDNISOLONE ACETATE 40 MG/ML IJ SUSP
80.0000 mg | Freq: Once | INTRAMUSCULAR | Status: AC
  Administered 2015-10-17: 80 mg via INTRA_ARTICULAR

## 2015-10-17 NOTE — Progress Notes (Signed)
Dr. Frederico Hamman T. Aribelle Mccosh, MD, [facility] [place], [address] Phone: [phone] Fax: [phone]  10/17/2015  Patient: Dominic Kane, MRN: [id number], DOB: 07-19-1940, 75 y.o.  Primary Physician:  Eliezer Lofts, MD   Chief Complaint  Patient presents with  . Knee Pain    Left   Subjective:   Dominic Kane is a 75 y.o. very pleasant male patient who presents with the following:  Lab Results  Component Value Date   HGBA1C 6.4 01/11/2015    Knee pain: the patient is an ongoing left-sided knee pain worsening over the last year.  He has had some intermittent knee problems off and on over the last few years, and he does have a history of having an unknown opened the surgery at the age of 4.   Other than this, he has had no recent trauma or injury.  He is had some mild swelling.  He does have pain with going up and down stairs and some decreased stabilization.  He has taken some Tylenol as well as an occasional anti-inflammatory.  Medial compartmental OA  L knee injection  Past Medical History, Surgical History, Social History, Family History, Problem List, Medications, and Allergies have been reviewed and updated if relevant.  Patient Active Problem List   Diagnosis Date Noted  . Tinea pedis of both feet 01/11/2015  . CHF (NYHA class II, ACC/AHA stage C) ([place]) 10/12/2014  . Counseling regarding end of life decision making 10/12/2014  . Diabetes mellitus with no complication ([place]) 123456  . Hx of colonic polyps 05/01/2014  . Osteoarthritis of left knee 11/05/2012  . Hearing loss 04/20/2012  . CAD, NATIVE VESSEL 03/21/2010  . 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 UNSPECIFIED VITAMIN D DEFICIENCY 05/01/2009  . EUSTACHIAN TUBE DYSFUNCTION, BILATERAL 04/27/2009  . Pure hypercholesterolemia 09/01/2008  . BACK PAIN, LUMBAR, WITH RADICULOPATHY 01/24/2008  . AORTIC INSUFFICIENCY 10/08/2007  . CARDIOMYOPATHY,  PRIMARY 10/08/2007  . BENIGN POSITIONAL VERTIGO 06/24/2007  . SINUS BRADYCARDIA 06/24/2007  . Essential hypertension, benign 12/23/2006  . BENIGN PROSTATIC HYPERTROPHY 12/23/2006  . DIVERTICULITIS, HX OF 12/23/2006    Past Medical History:  Diagnosis Date  . Abnormality of gait   . Acute sinusitis, unspecified   . Allergy   . 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 Aortic valve disorders   . Benign paroxysmal positional vertigo   . Cataract   . CHF (congestive heart failure) ([place])   . Coronary atherosclerosis of native coronary artery   . Displacement of lumbar intervertebral disc without myelopathy   . Diverticulosis of colon (without mention of hemorrhage)   . Dysfunction of eustachian tube   . Enlarged prostate   . GERD (gastroesophageal reflux disease)   . Headache(784.0)   . Hypertrophy of prostate without urinary obstruction and other lower urinary tract symptoms (LUTS)   . Lipoprotein deficiencies   . Other and unspecified hyperlipidemia   . Other primary cardiomyopathies   . Pain in joint, lower leg   . Personal history of colonic polyps   . Personal history of other diseases of digestive system   . Pes anserinus tendinitis or bursitis   . Sinoatrial node dysfunction (HCC)   . 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 Thoracic or lumbosacral neuritis or radiculitis, unspecified   . Unspecified disorder of skin and subcutaneous tissue   . Unspecified essential hypertension   . Unspecified vitamin D deficiency  Past Surgical History:  Procedure Laterality Date  . DOBUTAMINE STRESS ECHO  1/07   Lateral hypokinesis but no ischemia  . ESOPHAGOGASTRODUODENOSCOPY  11/07   Schatzki's ring, non bleeding erosive gastropathy [facility] [place]  . KNEE SURGERY Left 1947  . Pulmonary functioning tests  1. 2003  2. 2005   1. Diminished lung capacity  2. Stable    Social History   Social History  . Marital status: Married    Spouse name: N/A  . Number of  children: 1  . Years of education: N/A   Occupational History  . retired Teacher, English as a foreign language Retired   Social History Main Topics  . Smoking status: Former Smoker    Quit date: 07/03/1980  . Smokeless tobacco: Never Used  . Alcohol use 0.6 - 1.2 oz/week    1 - 2 Glasses of wine per week  . Drug use: No  . Sexual activity: Not on file   Other Topics Concern  . Not on file   Social History Narrative  . No narrative on file    Family History  Problem Relation Age of Onset  . Heart failure Mother   . Coronary artery disease Mother   . Stroke Mother   . Cancer Father     LUNG  . Cancer Sister     BREAST  . Heart failure Maternal Grandmother   . Colon cancer Neg Hx     Allergies  Allergen Reactions  . Codeine     REACTION: nausea  . Sulfonamide Derivatives     REACTION: hives    Medication list reviewed and updated in full in [place].  GEN: No fevers, chills. Nontoxic. Primarily MSK c/o today. MSK: Detailed in the HPI GI: tolerating PO intake without difficulty Neuro: No numbness, parasthesias, or tingling associated. Otherwise the pertinent positives of the ROS are noted above.   Objective:   BP 100/64   Pulse 62   Temp 98.5 F (36.9 C) (Oral)   Ht 5' 10.25" (1.784 m)   Wt 203 lb 8 oz (92.3 kg)   BMI 28.99 kg/m    GEN: WDWN, NAD, Non-toxic, Alert & Oriented x 3 HEENT: Atraumatic, Normocephalic.  Ears and Nose: No external deformity. EXTR: No clubbing/cyanosis/edema NEURO: Normal gait.  PSYCH: Normally interactive. Conversant. Not depressed or anxious appearing.  Calm demeanor.   Knee:  L Gait: Normal heel toe pattern, antalgic ROM: 0-120 Effusion: minimal  Echymosis or edema: none Patellar tendon NT Painful PLICA: neg Patellar grind: negative Medial and lateral patellar facet loading: negative medial and lateral joint lines:TTP Mcmurray's pain Flexion-pinch pos Varus and valgus stress: stable Lachman: neg Ant and Post drawer: neg Hip  abduction, IR, ER: WNL Hip flexion str: 5/5 Hip abd: 5/5 Quad: 5/5 VMO atrophy:mild Hamstring concentric and eccentric: 5/5   Radiology: Dg Knee Ap/lat W/sunrise Left  Result Date: 10/18/2015 CLINICAL DATA:  Acute superimposed upon chronic left knee pain. EXAM: LEFT KNEE 3 VIEWS COMPARISON:  11/05/2012. FINDINGS: No evidence of acute, subacute or healed fractures. Severe medial compartment joint space narrowing, mild to moderate lateral and patellofemoral compartment joint space narrowing, and associated hypertrophic spurring. No evidence of a patellar tracking abnormality on the sunrise view. Small joint effusion. Femoropopliteal and tibioperoneal artery atherosclerosis. IMPRESSION: 1. Tricompartmental osteoarthritis, severe in the medial compartment and mild to moderate in the lateral and patellofemoral compartments. 2. Small joint effusion. 3. Femoropopliteal and tibioperoneal artery atherosclerosis. Electronically Signed   By: Evangeline Dakin M.D.   On: 10/18/2015  08:30    Assessment and Plan:   Primary localized osteoarthrosis of the knee, left - Plan: DG Knee AP/LAT W/Sunrise Left, methylPREDNISolone acetate (DEPO-MEDROL) injection 80 mg  Diabetes mellitus with no complication (HCC)  CHF (NYHA class II, ACC/AHA stage C) (HCC)   End-stage medial compartmental osteoarthritis on the left knee with mild osteoarthritic changes in the lateral and patellofemoral compartment.  Discussed treatment options.  Continue with Voltaren gel, oral Tylenol.   He has failed treatment with  Multiple oral anti-inflammatories, and he has had an excellent response to topical Voltaren gel in the past.  We also discussed that he could potentially be up candidate for a  Unicompartmental knee replacement.  CBG stable currently  Knee Injection, L Patient verbally consented to procedure. Risks (including potential rare risk of infection), benefits, and alternatives explained. Sterilely prepped with  Chloraprep. Ethyl cholride used for anesthesia. 8 cc Lidocaine 1% mixed with 2 mL Depo-Medrol 40 mg injected using the anteromedial approach without difficulty. No complications with procedure and tolerated well. Patient had decreased pain post-injection.   Follow-up: No Follow-up on file.  Modified Medications   Modified Medication Previous Medication   DICLOFENAC SODIUM (VOLTAREN) 1 % GEL diclofenac sodium (VOLTAREN) 1 % GEL      Apply 4 g topically 4 (four) times daily as needed.    Apply 2 g topically 4 (four) times daily as needed.   Orders Placed This Encounter  Procedures  . DG Knee AP/LAT W/Sunrise Left    Signed,  Tanikka Bresnan T. Jaeleah Smyser, MD   Patient's Medications  New Prescriptions   No medications on file  Previous Medications   ASPIRIN 81 MG TABLET    Take 81 mg by mouth daily.     CETIRIZINE HCL (KLS ALLER-TEC PO)    Take 10 mg by mouth daily.    FINASTERIDE (PROSCAR) 5 MG TABLET    TAKE ONE TABLET BY MOUTH ONE TIME DAILY   FUROSEMIDE (LASIX) 40 MG TABLET    Take 1 tablet (40 mg total) by mouth daily.   GLUCOSE BLOOD (ONE TOUCH ULTRA TEST) TEST STRIP    Use to check blood sugar two times a day.  Dx: E11.9   MELATONIN 5 MG TABS    Take 5 mg by mouth at bedtime. As needed for sleep   OMEPRAZOLE (PRILOSEC) 40 MG CAPSULE    TAKE ONE CAPSULE BY MOUTH ONE TIME DAILY   ONETOUCH DELICA LANCETS 99991111 MISC    Use to check blood sugar two times a day.  Dx: E11.9   RAMIPRIL (ALTACE) 2.5 MG CAPSULE    Take 1 capsule (2.5 mg total) by mouth daily.   ROSUVASTATIN (CRESTOR) 10 MG TABLET    Take 1 tablet (10 mg total) by mouth daily.   TERAZOSIN (HYTRIN) 10 MG CAPSULE    Take 1 capsule (10 mg total) by mouth daily.  Modified Medications   Modified Medication Previous Medication   DICLOFENAC SODIUM (VOLTAREN) 1 % GEL diclofenac sodium (VOLTAREN) 1 % GEL      Apply 4 g topically 4 (four) times daily as needed.    Apply 2 g topically 4 (four) times daily as needed.  Discontinued Medications     No medications on file

## 2015-10-17 NOTE — Progress Notes (Signed)
Pre visit review using our clinic review tool, if applicable. No additional management support is needed unless otherwise documented below in the visit note. 

## 2015-10-19 ENCOUNTER — Encounter: Payer: Self-pay | Admitting: Family Medicine

## 2015-10-19 ENCOUNTER — Ambulatory Visit (INDEPENDENT_AMBULATORY_CARE_PROVIDER_SITE_OTHER): Payer: Medicare Other | Admitting: Family Medicine

## 2015-10-19 VITALS — BP 104/72 | HR 50 | Temp 98.4°F | Ht 70.0 in | Wt 205.5 lb

## 2015-10-19 DIAGNOSIS — E119 Type 2 diabetes mellitus without complications: Secondary | ICD-10-CM | POA: Diagnosis not present

## 2015-10-19 DIAGNOSIS — E7800 Pure hypercholesterolemia, unspecified: Secondary | ICD-10-CM | POA: Diagnosis not present

## 2015-10-19 DIAGNOSIS — I10 Essential (primary) hypertension: Secondary | ICD-10-CM | POA: Diagnosis not present

## 2015-10-19 DIAGNOSIS — I509 Heart failure, unspecified: Secondary | ICD-10-CM

## 2015-10-19 DIAGNOSIS — Z0000 Encounter for general adult medical examination without abnormal findings: Secondary | ICD-10-CM | POA: Diagnosis not present

## 2015-10-19 DIAGNOSIS — I2510 Atherosclerotic heart disease of native coronary artery without angina pectoris: Secondary | ICD-10-CM

## 2015-10-19 LAB — HM DIABETES FOOT EXAM

## 2015-10-19 NOTE — Assessment & Plan Note (Signed)
Well controlled. Continue current medication.  

## 2015-10-19 NOTE — Progress Notes (Signed)
I have personally reviewed the Medicare Annual Wellness questionnaire and have noted 1.                   The patient's medical and social history 2.                   Their use of alcohol, tobacco or illicit drugs 3.                   Their current medications and supplements 4.                   The patient's functional ability including ADL's, fall risks, home safety risks and hearing or visual             impairment. 5.                   Diet and physical activities 6.                   Evidence for depression or mood disorders 7.         Updated provider list Cognitive evaluation was performed and recorded on pt medicare questionnaire form. The patients weight, height, BMI and visual acuity have been recorded in the chart  I have made referrals, counseling and provided education to the patient based review of the above and I have provided the pt with a written personalized care plan for preventive services.   Hypertension: At goal on current meds.  BP Readings from Last 3 Encounters:  10/19/15 104/72  10/17/15 100/64  05/23/15 108/72  Using medication without problems or lightheadedness: occ Chest pain with exertion:None Edema:none Short of breath:With moderate activity in the heat Average home BPs: not checking   Other issues: HX of CAD, aortic insufficiency, thoracic aneurysm, Cardiomyopathy, CHF EF 44% Followed by Dr. Glennie Hawk Seen last 05/2015 reviewed note in detail. LVEF has been noted to be around 45% on studies in 2010, 2011. Echo September 2013 suggested LVEF was around 30%. Cardiac MRI December 2014 showed mild enlargement of the aortic root (4.2 cm) and and EF of 44%. Last stress test 9/13 without ischemia.   BBlocker contraindicated as BP too low.  Elevated Cholesterol: LDL at goal on low dose crestor.  Lab Results  Component Value Date   CHOL 117 10/15/2015   HDL 38.50 (L) 10/15/2015   LDLCALC 64 10/15/2015   TRIG 75.0 10/15/2015   CHOLHDL 3  10/15/2015  Using medications without problems:None Muscle aches:none Diet compliance:moderate Exercise: walking. Other complaints:     Wt Readings from Last 3 Encounters:  10/19/15 205 lb 8 oz (93.2 kg)  10/17/15 203 lb 8 oz (92.3 kg)  05/23/15 204 lb 12.8 oz (92.9 kg)   Diabetes:  Stable control per CBG. Borderline A1C last year. Due for re-eval. Lab Results  Component Value Date   HGBA1C 6.4 01/11/2015  FBS 92-110  None > 200, < 60 Eye exam:  In last year. Foot: no ulcers today  Social History /Family History/Past Medical History reviewed and updated if needed.   Review of Systems  Constitutional: Negative for fever, fatigue and unexpected weight change.  HENT: Negative for ear pain, congestion, sore throat, rhinorrhea, trouble swallowing and postnasal drip.  Eyes: Negative for pain.  Respiratory: Negative for cough, shortness of breath and wheezing.  Cardiovascular: Negative for chest pain, palpitations and leg swelling.  Gastrointestinal: Negative for nausea, abdominal pain, diarrhea, constipation and blood in stool.  Genitourinary: Negative  for dysuria, urgency, hematuria, discharge, penile swelling, scrotal swelling, difficulty urinating, penile pain and testicular pain.  Musculoskeletal: negative Skin: Negative for rash.  Neurological: Negative for syncope, weakness, light-headedness, numbness and headaches.  Psychiatric/Behavioral:  Negative for behavioral problems and dysphoric mood. The patient is not nervous/anxious.       Objective:   Physical Exam  Constitutional: He appears well-developed and well-nourished. Non-toxic appearance. He does not appear ill. No distress.  HENT:  Head: Normocephalic and atraumatic.  Right Ear: Hearing, tympanic membrane, external ear and ear canal normal.  Left Ear: Hearing, tympanic membrane, external ear and ear canal normal.  Nose: Nose normal.  Mouth/Throat: Uvula is midline, oropharynx is clear and moist and  mucous membranes are normal.  Eyes: Conjunctivae, EOM and lids are normal. Pupils are equal, round, and reactive to light. No foreign bodies found.  Neck: Trachea normal, normal range of motion and phonation normal. Neck supple. Carotid bruit is not present. No mass and no thyromegaly present.  Cardiovascular: Normal rate, regular rhythm, S1 normal, S2 normal, intact distal pulses and normal pulses. Exam reveals no gallop.  No murmur heard. Pulmonary/Chest: Breath sounds normal. He has no wheezes. He has no rhonchi. He has no rales.  Abdominal: Soft. Normal appearance and bowel sounds are normal. There is no hepatosplenomegaly. There is no tenderness. There is no rebound, no guarding and no CVA tenderness. No hernia. Hernia confirmed negative in the right inguinal area and confirmed negative in the left inguinal area.  Genitourinary: Not performed. Musculoskeletal:   Thoracic back: Normal.   Lumbar back: Normal. He exhibits normal range of motion, no tenderness, no bony tenderness and no swelling.  Lymphadenopathy:   He has no cervical adenopathy.   Right: No inguinal adenopathy present.   Left: No inguinal adenopathy present.  Neurological: He is alert. He has normal strength and normal reflexes. No cranial nerve deficit or sensory deficit. Gait normal.  Skin: Skin is warm, dry and intact. No rash noted.  Psychiatric: He has a normal mood and affect. His speech is normal and behavior is normal. Judgment normal.     Diabetic foot exam: Normal inspection No skin breakdown Bilateral calluses  Normal DP pulses Normal sensation to light touch and monofilament Nails normal      Assessment & Plan:  The patient's preventative maintenance and recommended screening tests for an annual wellness exam were reviewed in full today. Brought up to date unless services declined.  Counselled on the importance of diet, exercise, and its role in overall health and  mortality. The patient's FH and SH was reviewed, including their home life, tobacco status, and drug and alcohol status.   Vaccines:Uptodate with PNA, shingles, Tdap. Colon: 2016  3 adenomas, Dr. Carlean Purl, repeat in 5 years . Former smoker 20 pipe, quit 35 years ago.  Prostate eval not indicated given age

## 2015-10-19 NOTE — Patient Instructions (Addendum)
Stop at lab on way out for A1C blood test.  keep working on healthy eating and regular exercise.

## 2015-10-19 NOTE — Addendum Note (Signed)
Addended by: Ellamae Sia on: 10/19/2015 04:33 PM   Modules accepted: Orders

## 2015-10-19 NOTE — Assessment & Plan Note (Signed)
Well controlled. Continue current medication. Encouraged exercise, weight loss, healthy eating habits.  

## 2015-10-19 NOTE — Assessment & Plan Note (Signed)
Euvolemic today, Stable per last [facility].

## 2015-10-19 NOTE — Assessment & Plan Note (Signed)
Due for a1c  

## 2015-10-19 NOTE — Progress Notes (Signed)
Pre visit review using our clinic review tool, if applicable. No additional management support is needed unless otherwise documented below in the visit note. 

## 2015-10-20 LAB — HEMOGLOBIN A1C
HEMOGLOBIN A1C: 6.2 % — AB (ref <5.7)
Mean Plasma Glucose: 131 mg/dL

## 2015-10-24 ENCOUNTER — Other Ambulatory Visit: Payer: Self-pay | Admitting: Family Medicine

## 2015-10-29 ENCOUNTER — Encounter (INDEPENDENT_AMBULATORY_CARE_PROVIDER_SITE_OTHER): Payer: Medicare Other | Admitting: Ophthalmology

## 2015-10-29 DIAGNOSIS — I10 Essential (primary) hypertension: Secondary | ICD-10-CM

## 2015-10-29 DIAGNOSIS — H35372 Puckering of macula, left eye: Secondary | ICD-10-CM

## 2015-10-29 DIAGNOSIS — H43813 Vitreous degeneration, bilateral: Secondary | ICD-10-CM

## 2015-10-29 DIAGNOSIS — H35033 Hypertensive retinopathy, bilateral: Secondary | ICD-10-CM

## 2015-10-29 DIAGNOSIS — H34811 Central retinal vein occlusion, right eye, with macular edema: Secondary | ICD-10-CM

## 2015-10-29 DIAGNOSIS — H35342 Macular cyst, hole, or pseudohole, left eye: Secondary | ICD-10-CM

## 2015-12-11 ENCOUNTER — Telehealth: Payer: Self-pay | Admitting: Family Medicine

## 2015-12-11 NOTE — Telephone Encounter (Signed)
Patient is coming in for a flu shot on 12/14/15.  Patient's wife is asking if he can get the over 65 flu shot.  Please call wife,Dominic Kane,back to let her know.

## 2015-12-11 NOTE — Telephone Encounter (Signed)
Yes he can but he should be aware there is an increase risk in SE associated. If he has tolerated in past should be fine.

## 2015-12-12 ENCOUNTER — Ambulatory Visit (INDEPENDENT_AMBULATORY_CARE_PROVIDER_SITE_OTHER): Payer: Medicare Other

## 2015-12-12 DIAGNOSIS — Z23 Encounter for immunization: Secondary | ICD-10-CM | POA: Diagnosis not present

## 2015-12-12 NOTE — Telephone Encounter (Signed)
I will be doing the flu clinic this afternoon. I will keep this in mind. Thanks

## 2015-12-12 NOTE — Telephone Encounter (Signed)
Dominic Kane notified as instructed by telephone.  He has not ever taken the high dose vaccine before.  He is on the flu clinic schedule today.

## 2015-12-14 ENCOUNTER — Ambulatory Visit: Payer: Medicare Other

## 2016-01-04 ENCOUNTER — Telehealth: Payer: Self-pay | Admitting: Family Medicine

## 2016-01-04 ENCOUNTER — Other Ambulatory Visit: Payer: Self-pay | Admitting: Family Medicine

## 2016-01-04 DIAGNOSIS — M1712 Unilateral primary osteoarthritis, left knee: Secondary | ICD-10-CM

## 2016-01-04 NOTE — Telephone Encounter (Signed)
Pt's knee is getting worse, pt would like referral to ortho. Either [facility] or [facility] is fine. cb number is [phone] Thanks

## 2016-01-04 NOTE — Telephone Encounter (Signed)
End-stage medial compartmental OA - we discussed this at his last OV.  I would like to consult Dr. Alvan Dame in this case.

## 2016-01-19 ENCOUNTER — Other Ambulatory Visit: Payer: Self-pay | Admitting: Family Medicine

## 2016-01-21 DIAGNOSIS — M1712 Unilateral primary osteoarthritis, left knee: Secondary | ICD-10-CM | POA: Diagnosis not present

## 2016-01-21 DIAGNOSIS — M25562 Pain in left knee: Secondary | ICD-10-CM | POA: Diagnosis not present

## 2016-01-30 ENCOUNTER — Telehealth: Payer: Self-pay | Admitting: *Deleted

## 2016-01-30 ENCOUNTER — Telehealth: Payer: Self-pay | Admitting: Family Medicine

## 2016-01-30 ENCOUNTER — Encounter: Payer: Self-pay | Admitting: Family Medicine

## 2016-01-30 ENCOUNTER — Telehealth: Payer: Self-pay | Admitting: Cardiovascular Disease

## 2016-01-30 NOTE — Telephone Encounter (Signed)
Spoke with patient made him aware he will need to schedule office visit prior to being cleared for surgery.

## 2016-01-30 NOTE — Telephone Encounter (Signed)
Surgical Clearance form placed in Dr. Rometta Emery in box to complete.

## 2016-01-30 NOTE — Telephone Encounter (Signed)
Pt dropped off surgical clearance form from [facility]. Pt planning on having Left TKA-medial and lateral w/wo patella resurfacing. Pt will need office visit prior to being cleared. I placed call to pt and left message to call back.

## 2016-01-30 NOTE — Telephone Encounter (Signed)
Pt is seeing B. Rosita Fire, [place] on November 10,2017

## 2016-01-30 NOTE — Telephone Encounter (Signed)
Pt dropped off form to be filled out for surgery. Please fax when completed, call pt and leave copy up front for pick up. I placed form in Rx tower.

## 2016-01-30 NOTE — Telephone Encounter (Signed)
Walk In Pt Form-[facility]-Clearance Dropped off gave to Pat A.

## 2016-02-01 ENCOUNTER — Telehealth: Payer: Self-pay

## 2016-02-01 ENCOUNTER — Ambulatory Visit (INDEPENDENT_AMBULATORY_CARE_PROVIDER_SITE_OTHER): Payer: Medicare Other | Admitting: Cardiology

## 2016-02-01 ENCOUNTER — Encounter: Payer: Self-pay | Admitting: Cardiology

## 2016-02-01 VITALS — BP 128/70 | HR 60 | Ht 74.0 in | Wt 212.4 lb

## 2016-02-01 DIAGNOSIS — I10 Essential (primary) hypertension: Secondary | ICD-10-CM

## 2016-02-01 DIAGNOSIS — I509 Heart failure, unspecified: Secondary | ICD-10-CM

## 2016-02-01 DIAGNOSIS — I2510 Atherosclerotic heart disease of native coronary artery without angina pectoris: Secondary | ICD-10-CM | POA: Diagnosis not present

## 2016-02-01 NOTE — Patient Instructions (Addendum)
Medication Instructions:  Your physician recommends that you continue on your current medications as directed. Please refer to the Current Medication list given to you today.   Labwork: None ordered  Testing/Procedures: None ordered  Follow-Up: Follow up as planned with Dr.McAlhany in March 2018  Any Other Special Instructions Will Be Listed Below (If Applicable). You have been cleared for your upcoming with Dr.Olin     If you need a refill on your cardiac medications before your next appointment, please call your pharmacy.

## 2016-02-01 NOTE — Progress Notes (Signed)
02/01/2016 Dominic Kane   Oct 06, 1940  [id number]  Primary Physician Amy Diona Browner, MD Primary Cardiologist: [name]   Reason for Visit/CC: Surgical Clearance for TKR  HPI:  Mr. Kritzer is a 75 yo white male, followed by [name], with a past medical history significant for diastolic and systolic CHF, hypertension, nonobstructive coronary artery disease, idiopathic cardiomyopathy, benign prostatic hypertrophy, hyperlipidemia and hiatal hernia. He had an episode of chest pain about 7 years ago; at which time, he was evaluated by the [facility] in [place], [place]. He was apparently known to have an ejection fraction of 35-40%, which was felt to be idiopathic. Left heart catheterization was performed in May 2005 and showed diffuse 15% irregularities in the LAD, diffuse 20% irregularities in the ramus intermedius branch, diffuse 30% irregularities in the nondominant circumflex, and a 50% stenosis in the proximal RCA with irregularities in the midportion of the vessel. The ascending aorta was mildly dilated with no evidence of dissection during that catheterization. Ejection fraction was noted to be 65%. An echocardiogram during that admission in May 2005 in [place] showed an ejection fraction of 55-60% with the ascending aorta at the sinotubular junction measuring 4.1 cm. LVEF has been noted to be around 45% on studies in 2010, 2011. Echo September 2013 suggested LVEF was around 30%. Cardiac MRI December 2014 showed mild enlargement of the aortic root (4.2 cm) and and EF of 44%. Last stress test 9/13 without ischemia.  He presents to clinic today for surgical clearance.  He is scheduled to undergo total knee replacement with Dr.Olin, of [facility]. He reports that he is has done well since his last office visit. He denies any anginal symptoms. No chest pain or dyspnea. He is able to complete at least 4 METS of physical activity without exertional chest pain or dyspnea.  He can walk up a flight of stairs without limitations. He also denies any signs and symptoms of acute heart failure. Again no dyspnea, orthopnea, PND or lower extremity edema. He denies any palpitations. No syncope/near-syncope. Vital signs are stable and well-controlled. Blood pressure is 128/70. His EKG shows sinus bradycardia with first-degree AV block. Heart rate 59 bpm. Bifascicular block is noted. Compared to prior EKG in March 2017, there is no significant change. He reports full medication compliance.    Current Meds  Medication Sig  . aspirin 81 MG tablet Take 81 mg by mouth daily.    . Cetirizine HCl (KLS ALLER-TEC PO) Take 10 mg by mouth daily.   . diclofenac sodium (VOLTAREN) 1 % GEL Apply 4 g topically 4 (four) times daily as needed.  . finasteride (PROSCAR) 5 MG tablet TAKE ONE TABLET BY MOUTH ONE TIME DAILY  . furosemide (LASIX) 40 MG tablet Take 1 tablet (40 mg total) by mouth daily.  [name] glucose blood (ONE TOUCH ULTRA TEST) test strip Use to check blood sugar two times a day.  Dx: E11.9  . Melatonin 5 MG TABS Take 5 mg by mouth at bedtime. As needed for sleep  . omeprazole (PRILOSEC) 40 MG capsule TAKE 1 CAPSULE DAILY  . ONETOUCH DELICA LANCETS 99991111 MISC Use to check blood sugar two times a day.  Dx: E11.9  . ramipril (ALTACE) 2.5 MG capsule Take 1 capsule (2.5 mg total) by mouth daily.  . rosuvastatin (CRESTOR) 10 MG tablet Take 1 tablet (10 mg total) by mouth daily.  [name] terazosin (HYTRIN) 10 MG capsule TAKE 1 CAPSULE DAILY   Allergies  Allergen Reactions  .  Codeine     REACTION: nausea  . Sulfonamide Derivatives     REACTION: hives   Past Medical History:  Diagnosis Date  . Abnormality of gait   . Acute sinusitis, unspecified   . Allergy   . 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 Aortic valve disorders   . Benign paroxysmal positional vertigo   . Cataract   . CHF (congestive heart failure) ([place])   . Coronary atherosclerosis of native coronary artery   .  Displacement of lumbar intervertebral disc without myelopathy   . Diverticulosis of colon (without mention of hemorrhage)   . Dysfunction of eustachian tube   . Enlarged prostate   . GERD (gastroesophageal reflux disease)   . Headache(784.0)   . Hypertrophy of prostate without urinary obstruction and other lower urinary tract symptoms (LUTS)   . Lipoprotein deficiencies   . Other and unspecified hyperlipidemia   . Other primary cardiomyopathies   . Pain in joint, lower leg   . Personal history of colonic polyps   . Personal history of other diseases of digestive system   . Pes anserinus tendinitis or bursitis   . Sinoatrial node dysfunction (HCC)   . 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 Thoracic or lumbosacral neuritis or radiculitis, unspecified   . Unspecified disorder of skin and subcutaneous tissue   . Unspecified essential hypertension   . Unspecified vitamin D deficiency    Family History  Problem Relation Age of Onset  . Heart failure Mother   . Coronary artery disease Mother   . Stroke Mother   . Cancer Father     LUNG  . Cancer Sister     BREAST  . Heart failure Maternal Grandmother   . Colon cancer Neg Hx    Past Surgical History:  Procedure Laterality Date  . DOBUTAMINE STRESS ECHO  1/07   Lateral hypokinesis but no ischemia  . ESOPHAGOGASTRODUODENOSCOPY  11/07   Schatzki's ring, non bleeding erosive gastropathy [place]  . KNEE SURGERY Left 1947  . Pulmonary functioning tests  1. 2003  2. 2005   1. Diminished lung capacity  2. Stable   Social History   Social History  . Marital status: Married    Spouse name: N/A  . Number of children: 1  . Years of education: N/A   Occupational History  . retired Teacher, English as a foreign language Retired   Social History Main Topics  . Smoking status: Former Smoker    Quit date: 07/03/1980  . Smokeless tobacco: Never Used  . Alcohol use 0.6 - 1.2 oz/week    1 - 2 Glasses of wine per week  . Drug use: No  . Sexual  activity: Not on file   Other Topics Concern  . Not on file   Social History Narrative  . No narrative on file     Review of Systems: General: negative for chills, fever, night sweats or weight changes.  Cardiovascular: negative for chest pain, dyspnea on exertion, edema, orthopnea, palpitations, paroxysmal nocturnal dyspnea or shortness of breath Dermatological: negative for rash Respiratory: negative for cough or wheezing Urologic: negative for hematuria Abdominal: negative for nausea, vomiting, diarrhea, bright red blood per rectum, melena, or hematemesis Neurologic: negative for visual changes, syncope, or dizziness All other systems reviewed and are otherwise negative except as noted above.   Physical Exam:  Blood pressure 128/70, pulse 60, height 6\' 2"  (1.88 m), weight 212 lb 6.4 oz (96.3 kg).  General appearance: alert, cooperative and no  distress Neck: no carotid bruit and no JVD Lungs: clear to auscultation bilaterally Heart: regular rate and rhythm, S1, S2 normal, no murmur, click, rub or gallop Extremities: extremities normal, atraumatic, no cyanosis or edema Pulses: 2+ and symmetric Skin: Skin color, texture, turgor normal. No rashes or lesions Neurologic: Grossly normal  EKG Sinus bradycardia 59 bpm. bifascicular block. Unchanged from prior.   ASSESSMENT AND PLAN:   1. CAD: He is known to have non-obstructive CAD by cath 2005. Normal stress test September 2013. No anginal symptomatology. Able to complete 4 METS of physical activity w/o limitation. He denies exertional CP and dyspnea. Continue ASA and statin.   2. Non-ischemic Cardiomyopathy: LVEF 44% by cardiac MRI 12/14. Continue ACE-I. No beta blocker given baseline bradycardia.   3. Thoracic aortic aneurysm:  Last imaging December 2014 by MRI, stable. Plan is to repeat every three years since it has been stable since 2005. Plan is for March 2017. No chest or back pain. BP is well controlled.   4. HLD:  Continue statin. Last LDL at goal July 2016.    5. Chronic systolic CHF: Volume status is ok today. Euvolemic. No dyspnea.  Increase Lasix as needed for weight gain.   6. Carotid bruit, right: Carotid dopplers January 2016 with no evidence of carotid artery disease.   7. Pre-operative Assessment: Pt with history of nonobstructive CAD on cath in 2005 with low risk stress test in 2013 and nonischemic cardiomyopathy, w/o  anginal symptomatology. He is able to complete at least 4 METS of physical activity (walk up a flight of stairs) without exertional chest pain or dyspnea. No EKG changes. Sinus bradycardia with first-degree AV block. Bifascicular block noted with right bundle branch block + left anterior fascicular block. This is unchanged compared to previous EKGs. Vital signs are stable. Blood pressure and heart rate are both well-controlled. Physical exam is benign. Regular rate and rhythm. No murmurs. He has been cleared to undergo left total knee replacement without need for any additional cardiac workup. Given his ischemic cardiomyopathy he is of at least low to moderate risk. We will fax clearance letter to Dr. Alvan Dame.   PLAN  Keep yearly f/u with [name] in March 2018.   Lyda Jester PA-C 02/01/2016 8:28 AM

## 2016-02-01 NOTE — Telephone Encounter (Signed)
Cardiac clearance placed in MR nurse fax box to be faxed to [place] attn:Dr.Olin/Sherry Jannifer Franklin

## 2016-02-04 ENCOUNTER — Encounter (INDEPENDENT_AMBULATORY_CARE_PROVIDER_SITE_OTHER): Payer: Medicare Other | Admitting: Ophthalmology

## 2016-02-04 DIAGNOSIS — H35372 Puckering of macula, left eye: Secondary | ICD-10-CM | POA: Diagnosis not present

## 2016-02-04 DIAGNOSIS — H35033 Hypertensive retinopathy, bilateral: Secondary | ICD-10-CM | POA: Diagnosis not present

## 2016-02-04 DIAGNOSIS — E11319 Type 2 diabetes mellitus with unspecified diabetic retinopathy without macular edema: Secondary | ICD-10-CM

## 2016-02-04 DIAGNOSIS — I10 Essential (primary) hypertension: Secondary | ICD-10-CM

## 2016-02-04 DIAGNOSIS — H34811 Central retinal vein occlusion, right eye, with macular edema: Secondary | ICD-10-CM | POA: Diagnosis not present

## 2016-02-04 DIAGNOSIS — H43813 Vitreous degeneration, bilateral: Secondary | ICD-10-CM

## 2016-02-04 DIAGNOSIS — E113292 Type 2 diabetes mellitus with mild nonproliferative diabetic retinopathy without macular edema, left eye: Secondary | ICD-10-CM

## 2016-02-06 DIAGNOSIS — M1712 Unilateral primary osteoarthritis, left knee: Secondary | ICD-10-CM | POA: Diagnosis not present

## 2016-02-21 DIAGNOSIS — H903 Sensorineural hearing loss, bilateral: Secondary | ICD-10-CM | POA: Diagnosis not present

## 2016-03-07 ENCOUNTER — Other Ambulatory Visit: Payer: Self-pay | Admitting: Family Medicine

## 2016-03-08 NOTE — H&P (Signed)
TOTAL KNEE ADMISSION H&P  Patient is being admitted for left total knee arthroplasty.  Subjective:  Chief Complaint:   Left knee primary OA / pain  HPI: Dominic Kane, 75 y.o. male, has a history of pain and functional disability in the left knee due to arthritis and has failed non-surgical conservative treatments for greater than 12 weeks to includeNSAID's and/or analgesics, corticosteriod injections, use of assistive devices and activity modification.  Onset of symptoms was gradual, starting >10 years ago with gradually worsening course since that time. The patient noted prior procedures on the knee to include  menisectomy on the left knee(s).  Patient currently rates pain in the left knee(s) at 8 out of 10 with activity. Patient has night pain, worsening of pain with activity and weight bearing, pain that interferes with activities of daily living, pain with passive range of motion, crepitus and joint swelling.  Patient has evidence of periarticular osteophytes and joint space narrowing by imaging studies.  There is no active infection.   Risks, benefits and expectations were discussed with the patient.  Risks including but not limited to the risk of anesthesia, blood clots, nerve damage, blood vessel damage, failure of the prosthesis, infection and up to and including death.  Patient understand the risks, benefits and expectations and wishes to proceed with surgery.   PCP: Eliezer Lofts, MD  D/C Plans:      Home  Post-op Meds:       No Rx given  Tranexamic Acid:      To be given - IV   Decadron:      Is to be given  FYI:     ASA  Norco    Patient Active Problem List   Diagnosis Date Noted  . CHF (NYHA class II, ACC/AHA stage C) ([place]) 10/12/2014  . Counseling regarding end of life decision making 10/12/2014  . Diabetes mellitus with no complication ([place]) 123456  . Hx of colonic polyps 05/01/2014  . Osteoarthritis of left knee 11/05/2012  . Hearing loss 04/20/2012  . CAD,  NATIVE VESSEL 03/21/2010  . 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 UNSPECIFIED VITAMIN D DEFICIENCY 05/01/2009  . EUSTACHIAN TUBE DYSFUNCTION, BILATERAL 04/27/2009  . Pure hypercholesterolemia 09/01/2008  . BACK PAIN, LUMBAR, WITH RADICULOPATHY 01/24/2008  . AORTIC INSUFFICIENCY 10/08/2007  . CARDIOMYOPATHY, PRIMARY 10/08/2007  . BENIGN POSITIONAL VERTIGO 06/24/2007  . SINUS BRADYCARDIA 06/24/2007  . Essential hypertension, benign 12/23/2006  . BENIGN PROSTATIC HYPERTROPHY 12/23/2006  . DIVERTICULITIS, HX OF 12/23/2006   Past Medical History:  Diagnosis Date  . Abnormality of gait   . Acute sinusitis, unspecified   . Allergy   . 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 Aortic valve disorders   . Benign paroxysmal positional vertigo   . Cataract   . CHF (congestive heart failure) ([address])   . Coronary atherosclerosis of native coronary artery   . Displacement of lumbar intervertebral disc without myelopathy   . Diverticulosis of colon (without mention of hemorrhage)   . Dysfunction of eustachian tube   . Enlarged prostate   . GERD (gastroesophageal reflux disease)   . Headache(784.0)   . Hypertrophy of prostate without urinary obstruction and other lower urinary tract symptoms (LUTS)   . Lipoprotein deficiencies   . Other and unspecified hyperlipidemia   . Other primary cardiomyopathies   . Pain in joint, lower leg   . Personal history of colonic polyps   . Personal history of other diseases of digestive system   .  Pes anserinus tendinitis or bursitis   . Sinoatrial node dysfunction (HCC)   . 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 Thoracic or lumbosacral neuritis or radiculitis, unspecified   . Unspecified disorder of skin and subcutaneous tissue   . Unspecified essential hypertension   . Unspecified vitamin D deficiency     Past Surgical History:  Procedure Laterality Date  . DOBUTAMINE STRESS ECHO  1/07   Lateral hypokinesis but no ischemia  .  ESOPHAGOGASTRODUODENOSCOPY  11/07   Schatzki's ring, non bleeding erosive gastropathy [place]   . KNEE SURGERY Left 1947  . Pulmonary functioning tests  1. 2003  2. 2005   1. Diminished lung capacity  2. Stable    No prescriptions prior to admission.   Allergies  Allergen Reactions  . Codeine Nausea Only  . Sulfonamide Derivatives Hives    Social History  Substance Use Topics  . Smoking status: Former Smoker    Quit date: 07/03/1980  . Smokeless tobacco: Never Used  . Alcohol use 0.6 - 1.2 oz/week    1 - 2 Glasses of wine per week    Family History  Problem Relation Age of Onset  . Heart failure Mother   . Coronary artery disease Mother   . Stroke Mother   . Cancer Father     LUNG  . Cancer Sister     BREAST  . Heart failure Maternal Grandmother   . Colon cancer Neg Hx      Review of Systems  Constitutional: Negative.   HENT: Positive for hearing loss.   Eyes: Negative.   Respiratory: Negative.   Cardiovascular: Negative.   Gastrointestinal: Negative.   Genitourinary: Negative.   Musculoskeletal: Positive for back pain and joint pain.  Skin: Negative.   Neurological: Negative.   Endo/Heme/Allergies: Positive for environmental allergies.  Psychiatric/Behavioral: Negative.     Objective:  Physical Exam  Constitutional: He is oriented to person, place, and time. He appears well-developed.  HENT:  Head: Normocephalic.  Eyes: Pupils are equal, round, and reactive to light.  Neck: Neck supple. No JVD present. No tracheal deviation present. No thyromegaly present.  Cardiovascular: Normal rate, regular rhythm, normal heart sounds and intact distal pulses.   Respiratory: Effort normal and breath sounds normal. No respiratory distress. He has no wheezes.  GI: Soft. There is no tenderness. There is no guarding.  Musculoskeletal:       Left knee: He exhibits decreased range of motion, swelling and bony tenderness. He exhibits no ecchymosis, no deformity, no  laceration and no erythema. Tenderness found.  Lymphadenopathy:    He has no cervical adenopathy.  Neurological: He is alert and oriented to person, place, and time.  Skin: Skin is warm and dry.  Psychiatric: He has a normal mood and affect.      Labs:  Estimated body mass index is 27.27 kg/m as calculated from the following:   Height as of 02/01/16: 6\' 2"  (1.88 m).   Weight as of 02/01/16: 96.3 kg (212 lb 6.4 oz).   Imaging Review Plain radiographs demonstrate severe degenerative joint disease of the left knee(s).  The bone quality appears to be good for age and reported activity level.  Assessment/Plan:  End stage arthritis, left knee   The patient history, physical examination, clinical judgment of the provider and imaging studies are consistent with end stage degenerative joint disease of the left knee(s) and total knee arthroplasty is deemed medically necessary. The treatment options including medical management, injection therapy arthroscopy  and arthroplasty were discussed at length. The risks and benefits of total knee arthroplasty were presented and reviewed. The risks due to aseptic loosening, infection, stiffness, patella tracking problems, thromboembolic complications and other imponderables were discussed. The patient acknowledged the explanation, agreed to proceed with the plan and consent was signed. Patient is being admitted for inpatient treatment for surgery, pain control, PT, OT, prophylactic antibiotics, VTE prophylaxis, progressive ambulation and ADL's and discharge planning. The patient is planning to be discharged home.     [place] Chanae Gemma   PA-C  03/08/2016, 9:24 PM

## 2016-03-11 ENCOUNTER — Encounter (HOSPITAL_COMMUNITY): Payer: Self-pay

## 2016-03-11 ENCOUNTER — Encounter (HOSPITAL_COMMUNITY)
Admission: RE | Admit: 2016-03-11 | Discharge: 2016-03-11 | Disposition: A | Discharge status: Home / Self Care | Payer: Medicare Other | Source: Ambulatory Visit | Attending: Orthopedic Surgery | Admitting: Orthopedic Surgery

## 2016-03-11 DIAGNOSIS — M1712 Unilateral primary osteoarthritis, left knee: Secondary | ICD-10-CM | POA: Diagnosis not present

## 2016-03-11 DIAGNOSIS — Z01812 Encounter for preprocedural laboratory examination: Secondary | ICD-10-CM | POA: Diagnosis not present

## 2016-03-11 DIAGNOSIS — M25562 Pain in left knee: Secondary | ICD-10-CM | POA: Diagnosis not present

## 2016-03-11 HISTORY — DX: Personal history of urinary calculi: Z87.442

## 2016-03-11 HISTORY — DX: Type 2 diabetes mellitus without complications: E11.9

## 2016-03-11 HISTORY — DX: Unspecified osteoarthritis, unspecified site: M19.90

## 2016-03-11 LAB — GLUCOSE, CAPILLARY: GLUCOSE-CAPILLARY: 135 mg/dL — AB (ref 65–99)

## 2016-03-11 LAB — BASIC METABOLIC PANEL
Anion gap: 4 — ABNORMAL LOW (ref 5–15)
BUN: 25 mg/dL — ABNORMAL HIGH (ref 6–20)
CHLORIDE: 108 mmol/L (ref 101–111)
CO2: 28 mmol/L (ref 22–32)
Calcium: 9.1 mg/dL (ref 8.9–10.3)
Creatinine, Ser: 1 mg/dL (ref 0.61–1.24)
GFR calc non Af Amer: >60 mL/min (ref >60)
Glucose, Bld: 118 mg/dL — ABNORMAL HIGH (ref 65–99)
POTASSIUM: 4.3 mmol/L (ref 3.5–5.1)
SODIUM: 140 mmol/L (ref 135–145)

## 2016-03-11 LAB — CBC
HEMATOCRIT: 36.1 % — AB (ref 39.0–52.0)
Hemoglobin: 12 g/dL — ABNORMAL LOW (ref 13.0–17.0)
MCH: 28.9 pg (ref 26.0–34.0)
MCHC: 33.2 g/dL (ref 30.0–36.0)
MCV: 87 fL (ref 78.0–100.0)
Platelets: 172 10*3/uL (ref 150–400)
RBC: 4.15 MIL/uL — AB (ref 4.22–5.81)
RDW: 13.2 % (ref 11.5–15.5)
WBC: 7.5 10*3/uL (ref 4.0–10.5)

## 2016-03-11 LAB — ABO/RH: ABO/RH(D): O POS

## 2016-03-11 NOTE — Patient Instructions (Addendum)
[place]  03/11/2016   Your procedure is scheduled on: 03-08-16   Report to [facility]  Entrance take [facility]  elevators to 3rd floor to  [place] at [place] AM.  Call this number if you have problems the morning of surgery [phone]   Remember: ONLY 1 PERSON MAY GO WITH YOU TO SHORT STAY TO GET  READY MORNING OF YOUR SURGERY.  Do not eat food or drink liquids :After Midnight.     Take these medicines the morning of surgery with A SIP OF WATER: Cetirizine. Finasteride. Terazosin. DO NOT TAKE ANY DIABETIC MEDICATIONS DAY OF YOUR SURGERY                               You may not have any metal on your body including hair pins and              piercings  Do not wear jewelry, make-up, lotions, powders or perfumes, deodorant             Do not wear nail polish.  Do not shave  48 hours prior to surgery.              Men may shave face and neck.   Do not bring valuables to the hospital. [place].  Contacts, dentures or bridgework may not be worn into surgery.  Leave suitcase in the car. After surgery it may be brought to your room.     Patients discharged the day of surgery will not be allowed to drive home.  Name and phone number of your driver: Leda Gauze- spouse [phone] cell  Special Instructions: N/A              Please read over the following fact sheets you were given: _____________________________________________________________________             [facility] - Preparing for Surgery Before surgery, you can play an important role.  Because skin is not sterile, your skin needs to be as free of germs as possible.  You can reduce the number of germs on your skin by washing with CHG (chlorahexidine gluconate) soap before surgery.  CHG is an antiseptic cleaner which kills germs and bonds with the skin to continue killing germs even after washing. Please DO NOT use if you have an allergy  to CHG or antibacterial soaps.  If your skin becomes reddened/irritated stop using the CHG and inform your nurse when you arrive at Short Stay. Do not shave (including legs and underarms) for at least 48 hours prior to the first CHG shower.  You may shave your face/neck. Please follow these instructions carefully:  1.  Shower with CHG Soap the night before surgery and the  morning of Surgery.  2.  If you choose to wash your hair, wash your hair first as usual with your  normal  shampoo.  3.  After you shampoo, rinse your hair and body thoroughly to remove the  shampoo.                           4.  Use CHG as you would any other liquid soap.  You can apply chg directly  to the skin and wash                       Gently with a scrungie or clean washcloth.  5.  Apply the CHG Soap to your body ONLY FROM THE NECK DOWN.   Do not use on face/ open                           Wound or open sores. Avoid contact with eyes, ears mouth and genitals (private parts).                       Wash face,  Genitals (private parts) with your normal soap.             6.  Wash thoroughly, paying special attention to the area where your surgery  will be performed.  7.  Thoroughly rinse your body with warm water from the neck down.  8.  DO NOT shower/wash with your normal soap after using and rinsing off  the CHG Soap.                9.  Pat yourself dry with a clean towel.            10.  Wear clean pajamas.            11.  Place clean sheets on your bed the night of your first shower and do not  sleep with pets. Day of Surgery : Do not apply any lotions/deodorants the morning of surgery.  Please wear clean clothes to the hospital/surgery center.  FAILURE TO FOLLOW THESE INSTRUCTIONS MAY RESULT IN THE CANCELLATION OF YOUR SURGERY   How to Manage Your Diabetes Before and After Surgery  Why is it important to control my blood sugar before and after surgery? . Improving blood sugar levels before and after surgery  helps healing and can limit problems. . A way of improving blood sugar control is eating a healthy diet by: o  Eating less sugar and carbohydrates o  Increasing activity/exercise o  Talking with your doctor about reaching your blood sugar goals . High blood sugars (greater than 180 mg/dL) can raise your risk of infections and slow your recovery, so you will need to focus on controlling your diabetes during the weeks before surgery. . Make sure that the doctor who takes care of your diabetes knows about your planned surgery including the date and location.  How do I manage my blood sugar before surgery? . Check your blood sugar at least 4 times a day, starting 2 days before surgery, to make sure that the level is not too high or low. o Check your blood sugar the morning of your surgery when you wake up and every 2 hours until you get to the Short Stay unit. . If your blood sugar is less than 70 mg/dL, you will need to treat for low blood sugar: o Do not take insulin. o Treat a low blood sugar (less than 70 mg/dL) with  cup of clear juice (cranberry or apple), 4 glucose tablets, OR glucose gel. o Recheck blood sugar in 15 minutes after treatment (to make sure it is greater than 70 mg/dL). If your blood sugar is not greater than 70 mg/dL on recheck, call [phone] for further instructions. . Report your blood sugar to the short stay nurse when you get to Short Stay.  . If you  are admitted to the hospital after surgery: o Your blood sugar will be checked by the staff and you will probably be given insulin after surgery (instead of oral diabetes medicines) to make sure you have good blood sugar levels. o The goal for blood sugar control after surgery is 80-180 mg/dL.   WHAT DO I DO ABOUT MY DIABETES MEDICATION?  [name] Do not take oral diabetes medicines (pills) the morning of surgery.  Patient Signature:  Date:   Nurse Signature:  Date:   Reviewed and Endorsed by [facility] Patient  Education Committee, August 2015 ________________________________________________________________________   Incentive Spirometer  An incentive spirometer is a tool that can help keep your lungs clear and active. This tool measures how well you are filling your lungs with each breath. Taking long deep breaths may help reverse or decrease the chance of developing breathing (pulmonary) problems (especially infection) following:  A long period of time when you are unable to move or be active. BEFORE THE PROCEDURE   If the spirometer includes an indicator to show your best effort, your nurse or respiratory therapist will set it to a desired goal.  If possible, sit up straight or lean slightly forward. Try not to slouch.  Hold the incentive spirometer in an upright position. INSTRUCTIONS FOR USE  1. Sit on the edge of your bed if possible, or sit up as far as you can in bed or on a chair. 2. Hold the incentive spirometer in an upright position. 3. Breathe out normally. 4. Place the mouthpiece in your mouth and seal your lips tightly around it. 5. Breathe in slowly and as deeply as possible, raising the piston or the ball toward the top of the column. 6. Hold your breath for 3-5 seconds or for as long as possible. Allow the piston or ball to fall to the bottom of the column. 7. Remove the mouthpiece from your mouth and breathe out normally. 8. Rest for a few seconds and repeat Steps 1 through 7 at least 10 times every 1-2 hours when you are awake. Take your time and take a few normal breaths between deep breaths. 9. The spirometer may include an indicator to show your best effort. Use the indicator as a goal to work toward during each repetition. 10. After each set of 10 deep breaths, practice coughing to be sure your lungs are clear. If you have an incision (the cut made at the time of surgery), support your incision when coughing by placing a pillow or rolled up towels firmly against it. Once  you are able to get out of bed, walk around indoors and cough well. You may stop using the incentive spirometer when instructed by your caregiver.  RISKS AND COMPLICATIONS  Take your time so you do not get dizzy or light-headed.  If you are in pain, you may need to take or ask for pain medication before doing incentive spirometry. It is harder to take a deep breath if you are having pain. AFTER USE  Rest and breathe slowly and easily.  It can be helpful to keep track of a log of your progress. Your caregiver can provide you with a simple table to help with this. If you are using the spirometer at home, follow these instructions: [place] IF:   You are having difficultly using the spirometer.  You have trouble using the spirometer as often as instructed.  Your pain medication is not giving enough relief while using the spirometer.  You  develop fever of 100.5 F (38.1 C) or higher. SEEK IMMEDIATE MEDICAL CARE IF:   You cough up bloody sputum that had not been present before.  You develop fever of 102 F (38.9 C) or greater.  You develop worsening pain at or near the incision site. MAKE SURE YOU:   Understand these instructions.  Will watch your condition.  Will get help right away if you are not doing well or get worse. Document Released: 07/21/2006 Document Revised: 06/02/2011 Document Reviewed: 09/21/2006 [facility] Patient Information 2014 [place], [place].  WHAT IS A BLOOD TRANSFUSION? Blood Transfusion Information  A transfusion is the replacement of blood or some of its parts. Blood is made up of multiple cells which provide different functions.  Red blood cells carry oxygen and are used for blood loss replacement.  White blood cells fight against infection.  Platelets control bleeding.  Plasma helps clot blood.  Other blood products are available for specialized needs, such as hemophilia or other clotting disorders. BEFORE THE TRANSFUSION  Who gives  blood for transfusions?   Healthy volunteers who are fully evaluated to make sure their blood is safe. This is blood bank blood. Transfusion therapy is the safest it has ever been in the practice of medicine. Before blood is taken from a donor, a complete history is taken to make sure that person has no history of diseases nor engages in risky social behavior (examples are intravenous drug use or sexual activity with multiple partners). The donor's travel history is screened to minimize risk of transmitting infections, such as malaria. The donated blood is tested for signs of infectious diseases, such as HIV and hepatitis. The blood is then tested to be sure it is compatible with you in order to minimize the chance of a transfusion reaction. If you or a relative donates blood, this is often done in anticipation of surgery and is not appropriate for emergency situations. It takes many days to process the donated blood. RISKS AND COMPLICATIONS Although transfusion therapy is very safe and saves many lives, the main dangers of transfusion include:   Getting an infectious disease.  Developing a transfusion reaction. This is an allergic reaction to something in the blood you were given. Every precaution is taken to prevent this. The decision to have a blood transfusion has been considered carefully by your caregiver before blood is given. Blood is not given unless the benefits outweigh the risks. AFTER THE TRANSFUSION  Right after receiving a blood transfusion, you will usually feel much better and more energetic. This is especially true if your red blood cells have gotten low (anemic). The transfusion raises the level of the red blood cells which carry oxygen, and this usually causes an energy increase.  The nurse administering the transfusion will monitor you carefully for complications. HOME CARE INSTRUCTIONS  No special instructions are needed after a transfusion. You may find your energy is better.  Speak with your caregiver about any limitations on activity for underlying diseases you may have. SEEK MEDICAL CARE IF:   Your condition is not improving after your transfusion.  You develop redness or irritation at the intravenous (IV) site. SEEK IMMEDIATE MEDICAL CARE IF:  Any of the following symptoms occur over the next 12 hours:  Shaking chills.  You have a temperature by mouth above 102 F (38.9 C), not controlled by medicine.  Chest, back, or muscle pain.  People around you feel you are not acting correctly or are confused.  Shortness of breath or difficulty  breathing.  Dizziness and fainting.  You get a rash or develop hives.  You have a decrease in urine output.  Your urine turns a dark color or changes to pink, red, or brown. Any of the following symptoms occur over the next 10 days:  You have a temperature by mouth above 102 F (38.9 C), not controlled by medicine.  Shortness of breath.  Weakness after normal activity.  The white part of the eye turns yellow (jaundice).  You have a decrease in the amount of urine or are urinating less often.  Your urine turns a dark color or changes to pink, red, or brown. Document Released: 03/07/2000 Document Revised: 06/02/2011 Document Reviewed: 10/25/2007 [facility] Patient Information 2014 [place], [place].  _______________________________________________________________________   ________________________________________________________________________

## 2016-03-11 NOTE — Progress Notes (Signed)
EKG done 11'17 Epic. Clearance note ([name]) with chart 02-01-16.

## 2016-03-12 LAB — SURGICAL PCR SCREEN
MRSA, PCR: NEGATIVE
STAPHYLOCOCCUS AUREUS: NEGATIVE

## 2016-03-12 LAB — HEMOGLOBIN A1C
Hgb A1c MFr Bld: 6.2 % — ABNORMAL HIGH (ref 4.8–5.6)
Mean Plasma Glucose: 131 mg/dL

## 2016-03-12 NOTE — Progress Notes (Signed)
03-12-16 0930 Note Labs viewable in Epic. Note BUN. Message to office in basket [phone].

## 2016-03-18 ENCOUNTER — Inpatient Hospital Stay (HOSPITAL_COMMUNITY): Payer: Medicare Other | Admitting: Anesthesiology

## 2016-03-18 ENCOUNTER — Encounter (HOSPITAL_COMMUNITY): Payer: Self-pay | Admitting: *Deleted

## 2016-03-18 ENCOUNTER — Inpatient Hospital Stay (HOSPITAL_COMMUNITY)
Admission: RE | Admit: 2016-03-18 | Discharge: 2016-03-19 | DRG: 470 | Disposition: A | Discharge status: Home / Self Care | Payer: Medicare Other | Source: Ambulatory Visit | Attending: Orthopedic Surgery | Admitting: Orthopedic Surgery

## 2016-03-18 ENCOUNTER — Encounter (HOSPITAL_COMMUNITY)
Admission: RE | Disposition: A | Discharge status: Home / Self Care | Payer: Self-pay | Source: Ambulatory Visit | Attending: Orthopedic Surgery

## 2016-03-18 DIAGNOSIS — I351 Nonrheumatic aortic (valve) insufficiency: Secondary | ICD-10-CM | POA: Diagnosis present

## 2016-03-18 DIAGNOSIS — M1712 Unilateral primary osteoarthritis, left knee: Principal | ICD-10-CM | POA: Diagnosis present

## 2016-03-18 DIAGNOSIS — E6609 Other obesity due to excess calories: Secondary | ICD-10-CM | POA: Diagnosis present

## 2016-03-18 DIAGNOSIS — I509 Heart failure, unspecified: Secondary | ICD-10-CM | POA: Diagnosis not present

## 2016-03-18 DIAGNOSIS — N400 Enlarged prostate without lower urinary tract symptoms: Secondary | ICD-10-CM | POA: Diagnosis not present

## 2016-03-18 DIAGNOSIS — Z87891 Personal history of nicotine dependence: Secondary | ICD-10-CM

## 2016-03-18 DIAGNOSIS — Z6830 Body mass index (BMI) 30.0-30.9, adult: Secondary | ICD-10-CM

## 2016-03-18 DIAGNOSIS — I429 Cardiomyopathy, unspecified: Secondary | ICD-10-CM | POA: Diagnosis not present

## 2016-03-18 DIAGNOSIS — E119 Type 2 diabetes mellitus without complications: Secondary | ICD-10-CM | POA: Diagnosis present

## 2016-03-18 DIAGNOSIS — H9190 Unspecified hearing loss, unspecified ear: Secondary | ICD-10-CM | POA: Diagnosis not present

## 2016-03-18 DIAGNOSIS — I2510 Atherosclerotic heart disease of native coronary artery without angina pectoris: Secondary | ICD-10-CM | POA: Diagnosis not present

## 2016-03-18 DIAGNOSIS — K219 Gastro-esophageal reflux disease without esophagitis: Secondary | ICD-10-CM | POA: Diagnosis not present

## 2016-03-18 DIAGNOSIS — E7800 Pure hypercholesterolemia, unspecified: Secondary | ICD-10-CM | POA: Diagnosis present

## 2016-03-18 DIAGNOSIS — G8918 Other acute postprocedural pain: Secondary | ICD-10-CM | POA: Diagnosis not present

## 2016-03-18 DIAGNOSIS — Z96659 Presence of unspecified artificial knee joint: Secondary | ICD-10-CM

## 2016-03-18 DIAGNOSIS — M659 Synovitis and tenosynovitis, unspecified: Secondary | ICD-10-CM | POA: Diagnosis present

## 2016-03-18 DIAGNOSIS — I110 Hypertensive heart disease with heart failure: Secondary | ICD-10-CM | POA: Diagnosis present

## 2016-03-18 DIAGNOSIS — I712 Thoracic aortic aneurysm, without rupture: Secondary | ICD-10-CM | POA: Diagnosis not present

## 2016-03-18 DIAGNOSIS — M25562 Pain in left knee: Secondary | ICD-10-CM | POA: Diagnosis not present

## 2016-03-18 DIAGNOSIS — Z96652 Presence of left artificial knee joint: Secondary | ICD-10-CM

## 2016-03-18 HISTORY — PX: TOTAL KNEE ARTHROPLASTY: SHX125

## 2016-03-18 LAB — TYPE AND SCREEN
ABO/RH(D): O POS
Antibody Screen: NEGATIVE

## 2016-03-18 LAB — GLUCOSE, CAPILLARY
GLUCOSE-CAPILLARY: 116 mg/dL — AB (ref 65–99)
GLUCOSE-CAPILLARY: 232 mg/dL — AB (ref 65–99)
GLUCOSE-CAPILLARY: 142 mg/dL — AB (ref 65–99)
Glucose-Capillary: 182 mg/dL — ABNORMAL HIGH (ref 65–99)

## 2016-03-18 SURGERY — ARTHROPLASTY, KNEE, TOTAL
Anesthesia: Spinal | Site: Knee | Laterality: Left

## 2016-03-18 MED ORDER — INSULIN ASPART 100 UNIT/ML ~~LOC~~ SOLN
0.0000 [IU] | Freq: Three times a day (TID) | SUBCUTANEOUS | Status: DC
  Administered 2016-03-18: 5 [IU] via SUBCUTANEOUS
  Administered 2016-03-19 (×2): 2 [IU] via SUBCUTANEOUS

## 2016-03-18 MED ORDER — SODIUM CHLORIDE 0.9 % IJ SOLN
INTRAMUSCULAR | Status: AC
  Filled 2016-03-18: qty 50

## 2016-03-18 MED ORDER — PROPOFOL 500 MG/50ML IV EMUL
INTRAVENOUS | Status: DC | PRN
  Administered 2016-03-18: 100 ug/kg/min via INTRAVENOUS

## 2016-03-18 MED ORDER — POLYETHYLENE GLYCOL 3350 17 G PO PACK
17.0000 g | PACK | Freq: Two times a day (BID) | ORAL | Status: DC
  Administered 2016-03-18 – 2016-03-19 (×2): 17 g via ORAL
  Filled 2016-03-18 (×2): qty 1

## 2016-03-18 MED ORDER — DEXAMETHASONE SODIUM PHOSPHATE 10 MG/ML IJ SOLN
10.0000 mg | Freq: Once | INTRAMUSCULAR | Status: AC
  Administered 2016-03-19: 10 mg via INTRAVENOUS
  Filled 2016-03-18: qty 1

## 2016-03-18 MED ORDER — TERAZOSIN HCL 5 MG PO CAPS
10.0000 mg | ORAL_CAPSULE | Freq: Every day | ORAL | Status: DC
  Administered 2016-03-19: 10 mg via ORAL
  Filled 2016-03-18: qty 2

## 2016-03-18 MED ORDER — CEFAZOLIN SODIUM-DEXTROSE 2-4 GM/100ML-% IV SOLN
2.0000 g | Freq: Four times a day (QID) | INTRAVENOUS | Status: AC
  Administered 2016-03-18 (×2): 2 g via INTRAVENOUS
  Filled 2016-03-18 (×2): qty 100

## 2016-03-18 MED ORDER — FINASTERIDE 5 MG PO TABS
5.0000 mg | ORAL_TABLET | Freq: Every day | ORAL | Status: DC
  Administered 2016-03-19: 5 mg via ORAL
  Filled 2016-03-18: qty 1

## 2016-03-18 MED ORDER — ONDANSETRON HCL 4 MG/2ML IJ SOLN: 4.0000 mg | Freq: Four times a day (QID) | INTRAMUSCULAR | Status: DC | PRN

## 2016-03-18 MED ORDER — BUPIVACAINE-EPINEPHRINE (PF) 0.25% -1:200000 IJ SOLN
INTRAMUSCULAR | Status: DC | PRN
  Administered 2016-03-18: 20 mL

## 2016-03-18 MED ORDER — PROPOFOL 10 MG/ML IV BOLUS
INTRAVENOUS | Status: AC
  Filled 2016-03-18: qty 20

## 2016-03-18 MED ORDER — KETOROLAC TROMETHAMINE 30 MG/ML IJ SOLN
INTRAMUSCULAR | Status: AC
  Filled 2016-03-18: qty 1

## 2016-03-18 MED ORDER — PHENYLEPHRINE HCL 10 MG/ML IJ SOLN
INTRAMUSCULAR | Status: DC | PRN
  Administered 2016-03-18: 25 ug/min via INTRAVENOUS

## 2016-03-18 MED ORDER — SODIUM CHLORIDE 0.9 % IJ SOLN
INTRAMUSCULAR | Status: DC | PRN
  Administered 2016-03-18: 30 mL

## 2016-03-18 MED ORDER — BREATHE RIGHT LARGE STRP: 1.0000 | ORAL_STRIP | Freq: Every day | Status: DC | PRN

## 2016-03-18 MED ORDER — BISACODYL 10 MG RE SUPP: 10.0000 mg | Freq: Every day | RECTAL | Status: DC | PRN

## 2016-03-18 MED ORDER — MENTHOL 3 MG MT LOZG: 1.0000 | LOZENGE | OROMUCOSAL | Status: DC | PRN

## 2016-03-18 MED ORDER — BUPIVACAINE HCL (PF) 0.5 % IJ SOLN
INTRAMUSCULAR | Status: AC
  Filled 2016-03-18: qty 30

## 2016-03-18 MED ORDER — CEFAZOLIN SODIUM-DEXTROSE 2-4 GM/100ML-% IV SOLN
INTRAVENOUS | Status: AC
  Filled 2016-03-18: qty 100

## 2016-03-18 MED ORDER — ASPIRIN 81 MG PO CHEW
81.0000 mg | CHEWABLE_TABLET | Freq: Two times a day (BID) | ORAL | Status: DC
  Administered 2016-03-18 – 2016-03-19 (×2): 81 mg via ORAL
  Filled 2016-03-18 (×2): qty 1

## 2016-03-18 MED ORDER — KETOROLAC TROMETHAMINE 30 MG/ML IJ SOLN
INTRAMUSCULAR | Status: DC | PRN
  Administered 2016-03-18: 30 mg

## 2016-03-18 MED ORDER — FERROUS SULFATE 325 (65 FE) MG PO TABS
325.0000 mg | ORAL_TABLET | Freq: Three times a day (TID) | ORAL | Status: DC
  Administered 2016-03-18 – 2016-03-19 (×3): 325 mg via ORAL
  Filled 2016-03-18 (×3): qty 1

## 2016-03-18 MED ORDER — METHOCARBAMOL 500 MG PO TABS
500.0000 mg | ORAL_TABLET | Freq: Four times a day (QID) | ORAL | Status: DC | PRN
  Administered 2016-03-18 – 2016-03-19 (×2): 500 mg via ORAL
  Filled 2016-03-18 (×3): qty 1

## 2016-03-18 MED ORDER — CEFAZOLIN SODIUM-DEXTROSE 2-4 GM/100ML-% IV SOLN
2.0000 g | INTRAVENOUS | Status: AC
  Administered 2016-03-18: 2 g via INTRAVENOUS
  Filled 2016-03-18: qty 100

## 2016-03-18 MED ORDER — HYDROCODONE-ACETAMINOPHEN 7.5-325 MG PO TABS
1.0000 | ORAL_TABLET | ORAL | Status: DC
  Administered 2016-03-18 (×2): 2 via ORAL
  Administered 2016-03-18: 1 via ORAL
  Administered 2016-03-19 (×5): 2 via ORAL
  Filled 2016-03-18 (×8): qty 2

## 2016-03-18 MED ORDER — METHOCARBAMOL 1000 MG/10ML IJ SOLN
500.0000 mg | Freq: Four times a day (QID) | INTRAVENOUS | Status: DC | PRN
  Administered 2016-03-18: 500 mg via INTRAVENOUS
  Filled 2016-03-18: qty 5
  Filled 2016-03-18: qty 550

## 2016-03-18 MED ORDER — LACTATED RINGERS IV SOLN
INTRAVENOUS | Status: DC | PRN
  Administered 2016-03-18 (×2): via INTRAVENOUS

## 2016-03-18 MED ORDER — TRANEXAMIC ACID 1000 MG/10ML IV SOLN
1000.0000 mg | INTRAVENOUS | Status: AC
  Administered 2016-03-18: 1000 mg via INTRAVENOUS
  Filled 2016-03-18: qty 1100

## 2016-03-18 MED ORDER — ONDANSETRON HCL 4 MG PO TABS: 4.0000 mg | ORAL_TABLET | Freq: Four times a day (QID) | ORAL | Status: DC | PRN

## 2016-03-18 MED ORDER — BUPIVACAINE-EPINEPHRINE (PF) 0.5% -1:200000 IJ SOLN
INTRAMUSCULAR | Status: DC | PRN
  Administered 2016-03-18: 20 mL via PERINEURAL

## 2016-03-18 MED ORDER — FENTANYL CITRATE (PF) 100 MCG/2ML IJ SOLN
INTRAMUSCULAR | Status: AC
  Filled 2016-03-18: qty 2

## 2016-03-18 MED ORDER — OXYCODONE HCL 5 MG PO TABS: 5.0000 mg | ORAL_TABLET | Freq: Once | ORAL | Status: DC | PRN

## 2016-03-18 MED ORDER — PHENOL 1.4 % MT LIQD: 1.0000 | OROMUCOSAL | Status: DC | PRN

## 2016-03-18 MED ORDER — METOCLOPRAMIDE HCL 5 MG/ML IJ SOLN: 5.0000 mg | Freq: Three times a day (TID) | INTRAMUSCULAR | Status: DC | PRN

## 2016-03-18 MED ORDER — POTASSIUM CHLORIDE 2 MEQ/ML IV SOLN
INTRAVENOUS | Status: DC
  Administered 2016-03-18: 18:00:00 via INTRAVENOUS
  Filled 2016-03-18 (×4): qty 1000

## 2016-03-18 MED ORDER — DEXAMETHASONE SODIUM PHOSPHATE 10 MG/ML IJ SOLN
10.0000 mg | Freq: Once | INTRAMUSCULAR | Status: AC
  Administered 2016-03-18: 10 mg via INTRAVENOUS

## 2016-03-18 MED ORDER — PROPOFOL 10 MG/ML IV BOLUS
INTRAVENOUS | Status: DC | PRN
  Administered 2016-03-18: 30 mg via INTRAVENOUS

## 2016-03-18 MED ORDER — MIDAZOLAM HCL 2 MG/2ML IJ SOLN
INTRAMUSCULAR | Status: AC
  Filled 2016-03-18: qty 2

## 2016-03-18 MED ORDER — ONDANSETRON HCL 4 MG/2ML IJ SOLN
INTRAMUSCULAR | Status: AC
Start: 2016-03-18 — End: 2016-03-18
  Filled 2016-03-18: qty 2

## 2016-03-18 MED ORDER — BUPIVACAINE IN DEXTROSE 0.75-8.25 % IT SOLN
INTRATHECAL | Status: DC | PRN
Start: 2016-03-18 — End: 2016-03-18
  Administered 2016-03-18: 2 mL via INTRATHECAL

## 2016-03-18 MED ORDER — METOCLOPRAMIDE HCL 5 MG PO TABS: 5.0000 mg | ORAL_TABLET | Freq: Three times a day (TID) | ORAL | Status: DC | PRN

## 2016-03-18 MED ORDER — PANTOPRAZOLE SODIUM 40 MG PO TBEC: 80.0000 mg | DELAYED_RELEASE_TABLET | Freq: Every day | ORAL | Status: DC | PRN

## 2016-03-18 MED ORDER — HYDROMORPHONE HCL 1 MG/ML IJ SOLN
0.5000 mg | INTRAMUSCULAR | Status: DC | PRN
  Administered 2016-03-18: 1 mg via INTRAVENOUS
  Filled 2016-03-18: qty 1

## 2016-03-18 MED ORDER — PHENYLEPHRINE HCL 10 MG/ML IJ SOLN
INTRAMUSCULAR | Status: AC
  Filled 2016-03-18: qty 1

## 2016-03-18 MED ORDER — 0.9 % SODIUM CHLORIDE (POUR BTL) OPTIME
TOPICAL | Status: DC | PRN
  Administered 2016-03-18: 1000 mL

## 2016-03-18 MED ORDER — DOCUSATE SODIUM 100 MG PO CAPS
100.0000 mg | ORAL_CAPSULE | Freq: Two times a day (BID) | ORAL | Status: DC
  Administered 2016-03-18 – 2016-03-19 (×2): 100 mg via ORAL
  Filled 2016-03-18 (×2): qty 1

## 2016-03-18 MED ORDER — FENTANYL CITRATE (PF) 100 MCG/2ML IJ SOLN: 25.0000 ug | INTRAMUSCULAR | Status: DC | PRN

## 2016-03-18 MED ORDER — MAGNESIUM CITRATE PO SOLN: 1.0000 | Freq: Once | ORAL | Status: DC | PRN

## 2016-03-18 MED ORDER — DEXAMETHASONE SODIUM PHOSPHATE 10 MG/ML IJ SOLN
INTRAMUSCULAR | Status: AC
Start: 2016-03-18 — End: 2016-03-18
  Filled 2016-03-18: qty 1

## 2016-03-18 MED ORDER — BUPIVACAINE HCL (PF) 0.25 % IJ SOLN
INTRAMUSCULAR | Status: AC
Start: 2016-03-18 — End: 2016-03-18
  Filled 2016-03-18: qty 30

## 2016-03-18 MED ORDER — DIPHENHYDRAMINE HCL 25 MG PO CAPS: 25.0000 mg | ORAL_CAPSULE | Freq: Four times a day (QID) | ORAL | Status: DC | PRN

## 2016-03-18 MED ORDER — MIDAZOLAM HCL 5 MG/5ML IJ SOLN
INTRAMUSCULAR | Status: DC | PRN
  Administered 2016-03-18: 2 mg via INTRAVENOUS

## 2016-03-18 MED ORDER — ROSUVASTATIN CALCIUM 10 MG PO TABS
10.0000 mg | ORAL_TABLET | Freq: Every day | ORAL | Status: DC
  Administered 2016-03-18: 10 mg via ORAL
  Filled 2016-03-18: qty 1

## 2016-03-18 MED ORDER — CHLORHEXIDINE GLUCONATE 4 % EX LIQD: 60.0000 mL | Freq: Once | CUTANEOUS | Status: DC

## 2016-03-18 MED ORDER — OXYCODONE HCL 5 MG/5ML PO SOLN: 5.0000 mg | Freq: Once | ORAL | Status: DC | PRN

## 2016-03-18 MED ORDER — PROPOFOL 10 MG/ML IV BOLUS
INTRAVENOUS | Status: AC
  Filled 2016-03-18: qty 60

## 2016-03-18 MED ORDER — FUROSEMIDE 40 MG PO TABS
40.0000 mg | ORAL_TABLET | Freq: Every day | ORAL | Status: DC
  Administered 2016-03-19: 40 mg via ORAL
  Filled 2016-03-18: qty 1

## 2016-03-18 MED ORDER — ALUM & MAG HYDROXIDE-SIMETH 200-200-20 MG/5ML PO SUSP: 30.0000 mL | ORAL | Status: DC | PRN

## 2016-03-18 MED ORDER — FENTANYL CITRATE (PF) 100 MCG/2ML IJ SOLN
INTRAMUSCULAR | Status: DC | PRN
  Administered 2016-03-18 (×2): 50 ug via INTRAVENOUS

## 2016-03-18 SURGICAL SUPPLY — 47 items
ADH SKN CLS APL DERMABOND .7 (GAUZE/BANDAGES/DRESSINGS) ×1
BAG DECANTER FOR FLEXI CONT (MISCELLANEOUS) IMPLANT
BAG SPEC THK2 15X12 ZIP CLS (MISCELLANEOUS)
BAG ZIPLOCK 12X15 (MISCELLANEOUS) IMPLANT
BANDAGE ACE 6X5 VEL STRL LF (GAUZE/BANDAGES/DRESSINGS) ×2 IMPLANT
BLADE SAW SGTL 13.0X1.19X90.0M (BLADE) ×2 IMPLANT
BONE CEMENT GENTAMICIN (Cement) ×4 IMPLANT
BOWL SMART MIX CTS (DISPOSABLE) ×2 IMPLANT
CAPT KNEE TOTAL 3 ATTUNE ×1 IMPLANT
CEMENT BONE GENTAMICIN 40 (Cement) IMPLANT
CLOTH BEACON ORANGE TIMEOUT ST (SAFETY) ×2 IMPLANT
CUFF TOURN SGL QUICK 34 (TOURNIQUET CUFF) ×2
CUFF TRNQT CYL 34X4X40X1 (TOURNIQUET CUFF) ×1 IMPLANT
DECANTER SPIKE VIAL GLASS SM (MISCELLANEOUS) ×2 IMPLANT
DERMABOND ADVANCED (GAUZE/BANDAGES/DRESSINGS) ×1
DERMABOND ADVANCED .7 DNX12 (GAUZE/BANDAGES/DRESSINGS) ×1 IMPLANT
DRAPE U-SHAPE 47X51 STRL (DRAPES) ×2 IMPLANT
DRESSING AQUACEL AG SP 3.5X10 (GAUZE/BANDAGES/DRESSINGS) ×1 IMPLANT
DRSG AQUACEL AG SP 3.5X10 (GAUZE/BANDAGES/DRESSINGS) ×2
DURAPREP 26ML APPLICATOR (WOUND CARE) ×4 IMPLANT
ELECT REM PT RETURN 9FT ADLT (ELECTROSURGICAL) ×2
ELECTRODE REM PT RTRN 9FT ADLT (ELECTROSURGICAL) ×1 IMPLANT
GLOVE BIOGEL M 7.0 STRL (GLOVE) IMPLANT
GLOVE BIOGEL PI IND STRL 7.5 (GLOVE) ×1 IMPLANT
GLOVE BIOGEL PI IND STRL 8.5 (GLOVE) ×1 IMPLANT
GLOVE BIOGEL PI INDICATOR 7.5 (GLOVE) ×1
GLOVE BIOGEL PI INDICATOR 8.5 (GLOVE) ×1
GLOVE ECLIPSE 8.0 STRL XLNG CF (GLOVE) ×3 IMPLANT
GLOVE ORTHO TXT STRL SZ7.5 (GLOVE) ×4 IMPLANT
GOWN STRL REUS W/TWL LRG LVL3 (GOWN DISPOSABLE) ×2 IMPLANT
GOWN STRL REUS W/TWL XL LVL3 (GOWN DISPOSABLE) ×2 IMPLANT
HANDPIECE INTERPULSE COAX TIP (DISPOSABLE) ×2
MANIFOLD NEPTUNE II (INSTRUMENTS) ×2 IMPLANT
PACK TOTAL KNEE CUSTOM (KITS) ×2 IMPLANT
POSITIONER SURGICAL ARM (MISCELLANEOUS) ×2 IMPLANT
SET HNDPC FAN SPRY TIP SCT (DISPOSABLE) ×1 IMPLANT
SET PAD KNEE POSITIONER (MISCELLANEOUS) ×2 IMPLANT
SUT MNCRL AB 4-0 PS2 18 (SUTURE) ×2 IMPLANT
SUT VIC AB 1 CT1 36 (SUTURE) ×2 IMPLANT
SUT VIC AB 2-0 CT1 27 (SUTURE) ×6
SUT VIC AB 2-0 CT1 TAPERPNT 27 (SUTURE) ×3 IMPLANT
SUT VLOC 180 0 24IN GS25 (SUTURE) ×2 IMPLANT
SYR 50ML LL SCALE MARK (SYRINGE) ×2 IMPLANT
TRAY FOLEY W/METER SILVER 16FR (SET/KITS/TRAYS/PACK) ×2 IMPLANT
WATER STERILE IRR 1500ML POUR (IV SOLUTION) ×2 IMPLANT
WRAP KNEE MAXI GEL POST OP (GAUZE/BANDAGES/DRESSINGS) ×2 IMPLANT
YANKAUER SUCT BULB TIP 10FT TU (MISCELLANEOUS) ×2 IMPLANT

## 2016-03-18 NOTE — Op Note (Signed)
NAME:  Dominic Kane [facility]                      MEDICAL RECORD NO.:  [id number]                             FACILITY:  [facility]      PHYSICIAN:  Pietro Cassis. Alvan Dame, M.D.  DATE OF BIRTH:  1940/04/10      DATE OF PROCEDURE:  03/18/2016                                     OPERATIVE REPORT         PREOPERATIVE DIAGNOSIS:  Left knee osteoarthritis.      POSTOPERATIVE DIAGNOSIS:  Left knee osteoarthritis.      FINDINGS:  The patient was noted to have complete loss of cartilage and   bone-on-bone arthritis with associated osteophytes in the medial and patellofemoral compartments of   the knee with a significant synovitis and associated effusion.      PROCEDURE:  Left total knee replacement.      COMPONENTS USED:  DePuy Attune rotating platform posterior stabilized knee   system, a size 6 femur, 7 tibia, size 6 PS AOX insert, and 41 anatomic patellar   button.      SURGEON:  Pietro Cassis. Alvan Dame, M.D.      ASSISTANT:  Danae Orleans, PA-C.      ANESTHESIA:  Regional and Spinal.      SPECIMENS:  None.      COMPLICATION:  None.      DRAINS:  None.  EBL: <200cc      TOURNIQUET TIME:   Total Tourniquet Time Documented: Thigh (Left) - 30 minutes Total: Thigh (Left) - 30 minutes  .      The patient was stable to the recovery room.      INDICATION FOR PROCEDURE:  [name] is a 75 y.o. male patient of   mine.  The patient had been seen, evaluated, and treated conservatively in the   office with medication, activity modification, and injections.  The patient had   radiographic changes of bone-on-bone arthritis with endplate sclerosis and osteophytes noted.      The patient failed conservative measures including medication, injections, and activity modification, and at this point was ready for more definitive measures.   Based on the radiographic changes and failed conservative measures, the patient   decided to proceed with total knee replacement.  Risks of infection,   DVT,  component failure, need for revision surgery, postop course, and   expectations were all   discussed and reviewed.  Consent was obtained for benefit of pain   relief.      PROCEDURE IN DETAIL:  The patient was brought to the operative theater.   Once adequate anesthesia, preoperative antibiotics, 2 gm of Ancef, 1 gm of Tranexamic Acid, and 10 mg of Decadron administered, the patient was positioned supine with the left thigh tourniquet placed.  The  left lower extremity was prepped and draped in sterile fashion.  A time-   out was performed identifying the patient, planned procedure, and   extremity.      The left lower extremity was placed in the [facility] leg holder.  The leg was   exsanguinated, tourniquet elevated to 250 mmHg.  A midline incision was  made followed by median parapatellar arthrotomy.  Following initial   exposure, attention was first directed to the patella.  Precut   measurement was noted to be 25 mm.  I resected down to 14 mm and used a   41 anatomic patellar button to restore patellar height as well as cover the cut   surface.      The lug holes were drilled and a metal shim was placed to protect the   patella from retractors and saw blades.      At this point, attention was now directed to the femur.  The femoral   canal was opened with a drill, irrigated to try to prevent fat emboli.  An   intramedullary rod was passed at 5 degrees valgus, 9 mm of bone was   resected off the distal femur.  Following this resection, the tibia was   subluxated anteriorly.  Using the extramedullary guide, 2 mm of bone was resected off   the proximal medial tibia.  We confirmed the gap would be   stable medially and laterally with a size 5 spacer block as well as confirmed   the cut was perpendicular in the coronal plane, checking with an alignment rod.      Once this was done, I sized the femur to be a size 6 in the anterior-   posterior dimension, chose a standard component based  on medial and   lateral dimension.  The size 6 rotation block was then pinned in   position anterior referenced using the C-clamp to set rotation.  The   anterior, posterior, and  chamfer cuts were made without difficulty nor   notching making certain that I was along the anterior cortex to help   with flexion gap stability.      The final box cut was made off the lateral aspect of distal femur.      At this point, the tibia was sized to be a size 7, the size 7 tray was   then pinned in position through the medial third of the tubercle,   drilled, and keel punched.  Trial reduction was now carried with a 6 femur,  7 tibia, a size 6 PS insert, and the 41 anatomic patella botton.  The knee was brought to   extension, full extension with good flexion stability with the patella   tracking through the trochlea without application of pressure.  Given   all these findings the femoral lug holes were drilled and then the trial components removed.  Final components were   opened and cement was mixed.  The knee was irrigated with normal saline   solution and pulse lavage.  The synovial lining was   then injected with 30 cc of 0.25% Marcaine without epinephrine and 1 cc of Toradol plus 30 cc of NS for a total of 61 cc.      The knee was irrigated.  Final implants were then cemented onto clean and   dried cut surfaces of bone with the knee brought to extension with a size 6 PS trial insert.      Once the cement had fully cured, the excess cement was removed   throughout the knee.  I confirmed I was satisfied with the range of   motion and stability, and the final size 6 PS AOX insert was chosen.  It was   placed into the knee.      The tourniquet had been let down at 30 minutes.  No significant   hemostasis required.  The   extensor mechanism was then reapproximated using #1 Vicryl and #0 V-lock sutures with the knee   in flexion.  The   remaining wound was closed with 2-0 Vicryl and running 4-0  Monocryl.   The knee was cleaned, dried, dressed sterilely using Dermabond and   Aquacel dressing.  The patient was then   brought to recovery room in stable condition, tolerating the procedure   well.   Please note that Physician Assistant, Danae Orleans, PA-C, was present for the entirety of the case, and was utilized for pre-operative positioning, peri-operative retractor management, general facilitation of the procedure.  He was also utilized for primary wound closure at the end of the case.              Pietro Cassis Alvan Dame, M.D.    03/18/2016 8:40 AM

## 2016-03-18 NOTE — Interval H&P Note (Signed)
History and Physical Interval Note:  03/18/2016 7:07 AM  Dominic Kane  has presented today for surgery, with the diagnosis of LEFT KNEE OA  The various methods of treatment have been discussed with the patient and family. After consideration of risks, benefits and other options for treatment, the patient has consented to  Procedure(s): LEFT TOTAL KNEE ARTHROPLASTY (Left) as a surgical intervention .  The patient's history has been reviewed, patient examined, no change in status, stable for surgery.  I have reviewed the patient's chart and labs.  Questions were answered to the patient's satisfaction.     Mauri Pole

## 2016-03-18 NOTE — Evaluation (Signed)
Physical Therapy Evaluation Patient Details Name: Dominic Kane MRN: [id number] DOB: [birth year] Today's Date: 03/18/2016   History of Present Illness  l tka  Clinical Impression  The patient tolerated ambulation very well. Plans OPPT. Pt admitted with above diagnosis. Pt currently with functional limitations due to the deficits listed below (see PT Problem List).  Pt will benefit from skilled PT to increase their independence and safety with mobility to allow discharge to the venue listed below.       Follow Up Recommendations Outpatient PT;Supervision/Assistance - 24 hour    Equipment Recommendations  None recommended by PT    Recommendations for Other Services       Precautions / Restrictions Precautions Precautions: Knee      Mobility  Bed Mobility Overal bed mobility: Needs Assistance Bed Mobility: Supine to Sit     Supine to sit: Min assist     General bed mobility comments: assist left leg, cues for safety  Transfers Overall transfer level: Needs assistance Equipment used: Rolling walker (2 wheeled) Transfers: Sit to/from Stand           General transfer comment: cues for ahnd and left leg position  Ambulation/Gait Ambulation/Gait assistance: Min assist Ambulation Distance (Feet): 50 Feet Assistive device: Rolling walker (2 wheeled) Gait Pattern/deviations: Step-to pattern;Step-through pattern     General Gait Details: cues for sequence and posture  Stairs            Wheelchair Mobility    Modified Rankin (Stroke Patients Only)       Balance                                             Pertinent Vitals/Pain Pain Assessment: 0-10 Pain Score: 3  Pain Location: left knee and thigh Pain Descriptors / Indicators: Discomfort Pain Intervention(s): Premedicated before session;Repositioned;Monitored during session    Home Living Family/patient expects to be discharged to:: Private residence Living Arrangements:  Spouse/significant other Available Help at Discharge: Family Type of Home: House Home Access: Ramped entrance;Stairs to enter   Technical brewer of Steps: 1 to ramp Home Layout: One level Home Equipment: Environmental consultant - 2 wheels      Prior Function Level of Independence: Independent               Hand Dominance        Extremity/Trunk Assessment   Upper Extremity Assessment Upper Extremity Assessment: Defer to OT evaluation    Lower Extremity Assessment Lower Extremity Assessment: LLE deficits/detail LLE Deficits / Details: + SLR, knee flexion 50    Cervical / Trunk Assessment Cervical / Trunk Assessment: Normal  Communication      Cognition Arousal/Alertness: Awake/alert Behavior During Therapy: WFL for tasks assessed/performed Overall Cognitive Status: Within Functional Limits for tasks assessed                      General Comments      Exercises     Assessment/Plan    PT Assessment Patient needs continued PT services  PT Problem List Decreased strength;Decreased range of motion;Decreased activity tolerance;Decreased mobility;Decreased safety awareness;Decreased knowledge of use of DME;Decreased knowledge of precautions;Pain          PT Treatment Interventions DME instruction;Gait training;Stair training;Functional mobility training;Therapeutic activities;Patient/family education    PT Goals (Current goals can be found in the Care Plan section)  Acute Rehab PT  Goals Patient Stated Goal: to walk PT Goal Formulation: With patient/family Time For Goal Achievement: 03/21/16 Potential to Achieve Goals: Good    Frequency 7X/week   Barriers to discharge Decreased caregiver support      Co-evaluation               End of Session Equipment Utilized During Treatment: Gait belt Activity Tolerance: Patient tolerated treatment well Patient left: in chair;with call bell/phone within reach;with family/visitor present;with chair alarm  set Nurse Communication: Mobility status         Time: 1600-1620 PT Time Calculation (min) (ACUTE ONLY): 20 min   Charges:   PT Evaluation $PT Eval Low Complexity: 1 Procedure     PT G CodesClaretha Cooper 03/18/2016, 7:02 PM

## 2016-03-18 NOTE — Anesthesia Procedure Notes (Signed)
Anesthesia Regional Block:  Adductor canal block  Pre-Anesthetic Checklist: ,, timeout performed, Correct Patient, Correct Site, Correct Laterality, Correct Procedure, Correct Position, site marked, Risks and benefits discussed,  Surgical consent,  Pre-op evaluation,  At surgeon's request and post-op pain management  Laterality: Left  Prep: chloraprep       Needles:  Injection technique: Single-shot  Needle Type: Echogenic Needle     Needle Length: 9cm 9 cm Needle Gauge: 21 and 21 G    Additional Needles:  Procedures: ultrasound guided (picture in chart) Adductor canal block Narrative:  Start time: 03/18/2016 7:02 AM End time: 03/18/2016 7:12 AM Injection made incrementally with aspirations every 5 mL.  Performed by: Personally  Anesthesiologist: Banjamin Stovall  Additional Notes: Pt tolerated the procedure well.

## 2016-03-18 NOTE — Anesthesia Procedure Notes (Signed)
Spinal  Patient location during procedure: OR Start time: 03/18/2016 7:27 AM End time: 03/18/2016 7:35 AM Staffing Anesthesiologist: Marcie Bal, ADAM Performed: anesthesiologist  Preanesthetic Checklist Completed: patient identified, site marked, surgical consent, pre-op evaluation, timeout performed, IV checked, risks and benefits discussed and monitors and equipment checked Spinal Block Patient position: sitting Prep: site prepped and draped and DuraPrep Patient monitoring: heart rate, cardiac monitor, continuous pulse ox and blood pressure Approach: midline Location: L3-4 Injection technique: single-shot Needle Needle type: Pencan  Needle gauge: 24 G Needle length: 9 cm Assessment Sensory level: T8 Additional Notes Pt tolerated the procedure well.

## 2016-03-18 NOTE — Discharge Instructions (Signed)

## 2016-03-18 NOTE — Addendum Note (Signed)
Addendum  created 03/18/16 1424 by Lissa Morales, CRNA   Charge Capture section accepted, Visit diagnoses modified

## 2016-03-18 NOTE — Anesthesia Preprocedure Evaluation (Signed)
Anesthesia Evaluation  Patient identified by MRN, date of birth, ID band Patient awake    Reviewed: Allergy & Precautions, H&P , NPO status , Patient's Chart, lab work & pertinent test results  Airway Mallampati: II   Neck ROM: full    Dental   Pulmonary former smoker,    breath sounds clear to auscultation       Cardiovascular hypertension, + Peripheral Vascular Disease and +CHF   Rhythm:regular Rate:Normal  EF 30%.  H/o thoracic aneurysm   Neuro/Psych  Headaches,    GI/Hepatic GERD  ,  Endo/Other  diabetes, Type 2  Renal/GU      Musculoskeletal  (+) Arthritis ,   Abdominal   Peds  Hematology   Anesthesia Other Findings   Reproductive/Obstetrics                             Anesthesia Physical Anesthesia Plan  ASA: III  Anesthesia Plan: Spinal   Post-op Pain Management:  Regional for Post-op pain   Induction: Intravenous  Airway Management Planned: Simple Face Mask  Additional Equipment:   Intra-op Plan:   Post-operative Plan:   Informed Consent: I have reviewed the patients History and Physical, chart, labs and discussed the procedure including the risks, benefits and alternatives for the proposed anesthesia with the patient or authorized representative who has indicated his/her understanding and acceptance.     Plan Discussed with: CRNA, Anesthesiologist and Surgeon  Anesthesia Plan Comments:         Anesthesia Quick Evaluation

## 2016-03-18 NOTE — Anesthesia Postprocedure Evaluation (Signed)
Anesthesia Post Note  Patient: Dominic Kane  Procedure(s) Performed: Procedure(s) (LRB): LEFT TOTAL KNEE ARTHROPLASTY (Left)  Patient location during evaluation: PACU Anesthesia Type: Spinal Level of consciousness: oriented and awake and alert Pain management: pain level controlled Vital Signs Assessment: post-procedure vital signs reviewed and stable Respiratory status: spontaneous breathing, respiratory function stable and patient connected to nasal cannula oxygen Cardiovascular status: blood pressure returned to baseline and stable Postop Assessment: no headache and no backache Anesthetic complications: no       Last Vitals:  Vitals:   03/18/16 0957 03/18/16 1007  BP:  108/69  Pulse: (!) 52   Resp: 16 16  Temp:  36.8 C    Last Pain:  Vitals:   03/18/16 0517  TempSrc: Oral                 Khristian Seals S

## 2016-03-18 NOTE — Transfer of Care (Signed)
Immediate Anesthesia Transfer of Care Note  Patient: Dominic Kane  Procedure(s) Performed: Procedure(s): LEFT TOTAL KNEE ARTHROPLASTY (Left)  Patient Location: PACU  Anesthesia Type:MAC and Spinal  Level of Consciousness: awake, alert  and patient cooperative  Airway & Oxygen Therapy: Patient Spontanous Breathing and Patient connected to face mask oxygen  Post-op Assessment: Report given to RN and Post -op Vital signs reviewed and stable  Post vital signs: Reviewed and stable  Last Vitals:  Vitals:   03/18/16 0517  BP: (!) 147/73  Pulse: 77  Resp: 16  Temp: 36.4 C    Last Pain:  Vitals:   03/18/16 0517  TempSrc: Oral         Complications: No apparent anesthesia complications

## 2016-03-19 DIAGNOSIS — E6609 Other obesity due to excess calories: Secondary | ICD-10-CM | POA: Diagnosis present

## 2016-03-19 DIAGNOSIS — Z6830 Body mass index (BMI) 30.0-30.9, adult: Secondary | ICD-10-CM

## 2016-03-19 LAB — BASIC METABOLIC PANEL
ANION GAP: 5 (ref 5–15)
BUN: 21 mg/dL — ABNORMAL HIGH (ref 6–20)
CALCIUM: 8.4 mg/dL — AB (ref 8.9–10.3)
CO2: 25 mmol/L (ref 22–32)
Chloride: 103 mmol/L (ref 101–111)
Creatinine, Ser: 0.99 mg/dL (ref 0.61–1.24)
GLUCOSE: 179 mg/dL — AB (ref 65–99)
Potassium: 4.3 mmol/L (ref 3.5–5.1)
SODIUM: 133 mmol/L — AB (ref 135–145)

## 2016-03-19 LAB — CBC
HCT: 30.9 % — ABNORMAL LOW (ref 39.0–52.0)
Hemoglobin: 10.4 g/dL — ABNORMAL LOW (ref 13.0–17.0)
MCH: 30 pg (ref 26.0–34.0)
MCHC: 33.7 g/dL (ref 30.0–36.0)
MCV: 89 fL (ref 78.0–100.0)
PLATELETS: 156 10*3/uL (ref 150–400)
RBC: 3.47 MIL/uL — AB (ref 4.22–5.81)
RDW: 13.8 % (ref 11.5–15.5)
WBC: 17.1 10*3/uL — ABNORMAL HIGH (ref 4.0–10.5)

## 2016-03-19 LAB — GLUCOSE, CAPILLARY
GLUCOSE-CAPILLARY: 148 mg/dL — AB (ref 65–99)
Glucose-Capillary: 149 mg/dL — ABNORMAL HIGH (ref 65–99)

## 2016-03-19 MED ORDER — FERROUS SULFATE 325 (65 FE) MG PO TABS: 325.0000 mg | ORAL_TABLET | Freq: Three times a day (TID) | ORAL | 3 refills | Status: DC

## 2016-03-19 MED ORDER — DOCUSATE SODIUM 100 MG PO CAPS: 100.0000 mg | ORAL_CAPSULE | Freq: Two times a day (BID) | ORAL | 0 refills | Status: DC

## 2016-03-19 MED ORDER — POLYETHYLENE GLYCOL 3350 17 G PO PACK: 17.0000 g | PACK | Freq: Two times a day (BID) | ORAL | 0 refills | Status: DC

## 2016-03-19 MED ORDER — ASPIRIN 81 MG PO CHEW: 81.0000 mg | CHEWABLE_TABLET | Freq: Two times a day (BID) | ORAL | 0 refills | Status: AC

## 2016-03-19 MED ORDER — METHOCARBAMOL 500 MG PO TABS: 500.0000 mg | ORAL_TABLET | Freq: Four times a day (QID) | ORAL | 0 refills | Status: DC | PRN

## 2016-03-19 MED ORDER — HYDROCODONE-ACETAMINOPHEN 7.5-325 MG PO TABS: 1.0000 | ORAL_TABLET | ORAL | 0 refills | Status: DC | PRN

## 2016-03-19 NOTE — Progress Notes (Signed)
Physical Therapy Treatment Patient Details Name: Dominic Kane MRN: [id number] DOB: 1940-05-27 Today's Date: 03/19/2016    History of Present Illness l tka    PT Comments    Ready for Dc to home  Follow Up Recommendations  Outpatient PT;Supervision/Assistance - 24 hour     Equipment Recommendations  None recommended by PT    Recommendations for Other Services       Precautions / Restrictions Precautions Precautions: Knee;Fall Restrictions Weight Bearing Restrictions: No    Mobility  Bed Mobility Overal bed mobility: Modified Independent Bed Mobility: Supine to Sit     Supine to sit: Min assist     General bed mobility comments: assist with the left leg  Transfers Overall transfer level: Needs assistance Equipment used: Rolling walker (2 wheeled) Transfers: Sit to/from Stand Sit to Stand: Supervision         General transfer comment: cues for UE/LE placement  Ambulation/Gait Ambulation/Gait assistance: Supervision Ambulation Distance (Feet): 400 Feet Assistive device: Rolling walker (2 wheeled) Gait Pattern/deviations: Step-through pattern     General Gait Details: cues for sequence and posture   Stairs Stairs: Yes   Stair Management: Forwards;With walker Number of Stairs: 1 General stair comments: cues for technique  Wheelchair Mobility    Modified Rankin (Stroke Patients Only)       Balance                                    Cognition Arousal/Alertness: Awake/alert Behavior During Therapy: WFL for tasks assessed/performed Overall Cognitive Status: Within Functional Limits for tasks assessed                      Exercises    General Comments        Pertinent Vitals/Pain Pain Score: 6  Pain Location: left knee Pain Descriptors / Indicators: Aching;Tightness Pain Intervention(s): Monitored during session;Premedicated before session;Repositioned    Home Living Family/patient expects to be  discharged to:: Private residence Living Arrangements: Spouse/significant other Available Help at Discharge: Family         Home Equipment: Gilford Rile - 2 wheels Additional Comments: 3:1 delivered to room    Prior Function Level of Independence: Independent          PT Goals (current goals can now be found in the care plan section) Acute Rehab PT Goals Patient Stated Goal: to walk Progress towards PT goals: Progressing toward goals    Frequency    7X/week      PT Plan Current plan remains appropriate    Co-evaluation             End of Session   Activity Tolerance: Patient tolerated treatment well Patient left: in chair     Time: 1448-1511 PT Time Calculation (min) (ACUTE ONLY): 23 min  Charges:  $Gait Training: 23-37 mins $Therapeutic Exercise: 8-22 mins                    G Codes:      Claretha Cooper 03/19/2016, 3:23 PM

## 2016-03-19 NOTE — Progress Notes (Signed)
     Subjective: 1 Day Post-Op Procedure(s) (LRB): LEFT TOTAL KNEE ARTHROPLASTY (Left)   Patient reports pain as mild, pain controlled.  No events throughout the night.  Looking forward to working with PT.  Ready to be discharged home.   Objective:   VITALS:   Vitals:   03/19/16 0209 03/19/16 0604  BP: 116/68 109/68  Pulse: (!) 43 (!) 43  Resp: 18 18  Temp: 97.5 F (36.4 C) 97.5 F (36.4 C)    Dorsiflexion/Plantar flexion intact Incision: dressing C/D/I No cellulitis present Compartment soft  LABS  Recent Labs  03/19/16 0420  HGB 10.4*  HCT 30.9*  WBC 17.1*  PLT 156     Recent Labs  03/19/16 0420  NA 133*  K 4.3  BUN 21*  CREATININE 0.99  GLUCOSE 179*     Assessment/Plan: 1 Day Post-Op Procedure(s) (LRB): LEFT TOTAL KNEE ARTHROPLASTY (Left) Foley cath d/c'ed Advance diet Up with therapy D/C IV fluids Discharge home Follow up in 2 weeks at [facility]. Follow up with OLIN,Dywane Peruski D in 2 weeks.  Contact information:  [facility] [address], [place] [id number]    Overweight (BMI 25-29.9) Estimated body mass index is 27.05 kg/m as calculated from the following:   Height as of this encounter: 6\' 1"  (1.854 m).   Weight as of this encounter: 93 kg (205 lb 0.4 oz). Patient also counseled that weight may inhibit the healing process Patient counseled that losing weight will help with future health issues      [place] Pugh. Elajah Kunsman   PAC  03/19/2016, 9:16 AM

## 2016-03-19 NOTE — Evaluation (Signed)
Occupational Therapy Evaluation Patient Details Name: Dominic Kane MRN: [id number] DOB: Dec 05, 1940 Today's Date: 03/19/2016    History of Present Illness l tka   Clinical Impression   This 75 year old man was admitted for the above sx. All education was completed. No further OT is needed at this time    Follow Up Recommendations  No OT follow up;Supervision/Assistance - 24 hour    Equipment Recommendations  3 in 1 bedside commode (delivered)    Recommendations for Other Services       Precautions / Restrictions Precautions Precautions: Knee;Fall Restrictions Weight Bearing Restrictions: No      Mobility Bed Mobility               General bed mobility comments: oob  Transfers   Equipment used: Rolling walker (2 wheeled) Transfers: Sit to/from Stand Sit to Stand: Min guard         General transfer comment: cues for UE/LE placement    Balance                                            ADL Overall ADL's : Needs assistance/impaired     Grooming: Supervision/safety;Standing;Wash/dry hands;Wash/dry face;Oral care   Upper Body Bathing: Set up;Sitting   Lower Body Bathing: Minimal assistance;Sit to/from stand   Upper Body Dressing : Set up;Sitting   Lower Body Dressing: Moderate assistance;Sit to/from stand   Toilet Transfer: Min guard;Ambulation;BSC   Toileting- Water quality scientist and Hygiene: Min guard;Sit to/from stand   Tub/ Shower Transfer: Walk-in shower;Min guard;Ambulation;3 in 1     General ADL Comments: ambulated to bathroom.  Practiced shower transfer, donned pants and performed grooming at sink.  Wife with several questions and had several ideas, which I presented safer options to (i.e. using shower stall, not sitting on side of tub; not moving walker behind toilet to get up with--using 3:1)     Vision     Perception     Praxis      Pertinent Vitals/Pain Pain Score: 5  Pain Location: L knee Pain  Descriptors / Indicators: Tightness;Sore Pain Intervention(s): Limited activity within patient's tolerance;Monitored during session;Premedicated before session;Repositioned;Patient requesting pain meds-RN notified;Ice applied     Hand Dominance     Extremity/Trunk Assessment Upper Extremity Assessment Upper Extremity Assessment: Overall WFL for tasks assessed           Communication Communication Communication: HOH   Cognition Arousal/Alertness: Awake/alert Behavior During Therapy: WFL for tasks assessed/performed Overall Cognitive Status: Within Functional Limits for tasks assessed                     General Comments       Exercises       Shoulder Instructions      Home Living Family/patient expects to be discharged to:: Private residence Living Arrangements: Spouse/significant other Available Help at Discharge: Family               Bathroom Shower/Tub: Walk-in Psychologist, prison and probation services: Standard     Home Equipment: Environmental consultant - 2 wheels   Additional Comments: 3:1 delivered to room      Prior Functioning/Environment Level of Independence: Independent                 OT Problem List:     OT Treatment/Interventions:      OT Goals(Current goals  can be found in the care plan section) Acute Rehab OT Goals Patient Stated Goal: to walk OT Goal Formulation: All assessment and education complete, DC therapy  OT Frequency:     Barriers to D/C:            Co-evaluation              End of Session    Activity Tolerance: Patient tolerated treatment well Patient left: in bed;with call bell/phone within reach;with family/visitor present   Time: [id number] OT Time Calculation (min): 30 min Charges:  OT General Charges $OT Visit: 1 Procedure OT Evaluation $OT Eval Low Complexity: 1 Procedure OT Treatments $Self Care/Home Management : 8-22 mins G-Codes:    Arie Gable March 20, 2016, 12:33 PM  Lesle Chris,  OTR/L [phone] 03/20/2016

## 2016-03-19 NOTE — Care Management Note (Signed)
Case Management Note  Patient Details  Name: Dominic Kane MRN: [id number] Date of Birth: May 28, 1940  Subjective/Objective:                  LEFT TOTAL KNEE ARTHROPLASTY (Left)  Action/Plan: Discharge planning Expected Discharge Date:  03/19/2016               Expected Discharge Plan:  Home/Self Care  In-House Referral:  NA  Discharge planning Services  NA  Post Acute Care Choice:  NA Choice offered to:  Patient  DME Arranged:  N/A DME Agency:  NA  HH Arranged:  NA HH Agency:  NA  Status of Service:  Completed, signed off  If discussed at [place] of Stay Meetings, dates discussed:    Additional Comments: Cm met with pt in room to confirm plan is for pt to have outpt PT; pt confirms.  Pt states he has all the DME he needs at home.  No other CM needs were communicated. Dellie Catholic, RN 03/19/2016, 1:36 PM

## 2016-03-19 NOTE — Progress Notes (Signed)
Physical Therapy Treatment Patient Details Name: Dominic Kane MRN: [id number] DOB: Aug 01, 1940 Today's Date: 03/19/2016    History of Present Illness l tka    PT Comments    Progressing . To Dc this PM after PT.  Follow Up Recommendations  Outpatient PT;Supervision/Assistance - 24 hour     Equipment Recommendations  None recommended by PT    Recommendations for Other Services       Precautions / Restrictions Precautions Precautions: Knee;Fall Restrictions Weight Bearing Restrictions: No    Mobility  Bed Mobility   Bed Mobility: Supine to Sit     Supine to sit: Min assist     General bed mobility comments: assist with the left leg  Transfers Overall transfer level: Needs assistance Equipment used: Rolling walker (2 wheeled) Transfers: Sit to/from Stand Sit to Stand: Min guard         General transfer comment: cues for UE/LE placement  Ambulation/Gait Ambulation/Gait assistance: Min guard;Supervision Ambulation Distance (Feet): 200 Feet Assistive device: Rolling walker (2 wheeled) Gait Pattern/deviations: Step-to pattern;Step-through pattern     General Gait Details: cues for sequence and posture   Stairs            Wheelchair Mobility    Modified Rankin (Stroke Patients Only)       Balance                                    Cognition Arousal/Alertness: Awake/alert Behavior During Therapy: WFL for tasks assessed/performed Overall Cognitive Status: Within Functional Limits for tasks assessed                      Exercises Total Joint Exercises Ankle Circles/Pumps: AROM;Both;10 reps Quad Sets: AROM;Both;10 reps Towel Squeeze: AROM;Left;10 reps Short Arc Quad: AROM;Left;10 reps Heel Slides: AROM;Left;10 reps Hip ABduction/ADduction: AROM;Left;10 reps Straight Leg Raises: AAROM;Left;10 reps    General Comments        Pertinent Vitals/Pain Pain Score: 6  Pain Location: l knee with weight bearing  and exercise Pain Descriptors / Indicators: Aching;Tightness Pain Intervention(s): Premedicated before session;Repositioned;Ice applied;Monitored during session    Home Living Family/patient expects to be discharged to:: Private residence Living Arrangements: Spouse/significant other Available Help at Discharge: Family         Home Equipment: Gilford Rile - 2 wheels Additional Comments: 3:1 delivered to room    Prior Function Level of Independence: Independent          PT Goals (current goals can now be found in the care plan section) Acute Rehab PT Goals Patient Stated Goal: to walk Progress towards PT goals: Progressing toward goals    Frequency    7X/week      PT Plan Current plan remains appropriate    Co-evaluation             End of Session   Activity Tolerance: Patient tolerated treatment well Patient left: in chair;with call bell/phone within reach;with family/visitor present     Time: [id number] PT Time Calculation (min) (ACUTE ONLY): 39 min  Charges:  $Gait Training: 23-37 mins $Therapeutic Exercise: 8-22 mins                    G Codes:      Claretha Cooper 03/19/2016, 1:36 PM

## 2016-03-20 NOTE — Discharge Summary (Signed)
Physician Discharge Summary  Patient ID: Dominic Kane MRN: [id number] DOB/AGE: [birth year] 75 y.o.  Admit date: 03/18/2016 Discharge date: 03/19/2016   Procedures:  Procedure(s) (LRB): LEFT TOTAL KNEE ARTHROPLASTY (Left)  Attending Physician:  Dr. Paralee Cancel   Admission Diagnoses:   Left knee primary OA / pain  Discharge Diagnoses:  Principal Problem:   S/P left TKA Active Problems:   Overweight (BMI 25.0-29.9)  Past Medical History:  Diagnosis Date  . Abnormality of gait   . Acute sinusitis, unspecified   . Allergy   . 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 Aortic valve disorders   . Arthritis    Osteoarthritis-bilateral knees, lower back issues occasionaly related to knee issues  . Benign paroxysmal positional vertigo    not in a long time  . Cataract    resolved  . CHF (congestive heart failure) ([place])   . Coronary atherosclerosis of native coronary artery   . Diabetes mellitus without complication ([place])    Diet control only.  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 Diverticulosis of colon (without mention of hemorrhage)   . Dysfunction of eustachian tube    Hard of hearing"bilateral hearing aids"  . Enlarged prostate   . GERD (gastroesophageal reflux disease)   . Headache(784.0)   . History of kidney stones    past hx. 15 yrs ago x1  . Hypertrophy of prostate without urinary obstruction and other lower urinary tract symptoms (LUTS)   . Lipoprotein deficiencies   . Other and unspecified hyperlipidemia   . Other primary cardiomyopathies   . Pain in joint, lower leg   . Personal history of colonic polyps   . Personal history of other diseases of digestive system   . Pes anserinus tendinitis or bursitis    left shoulder remains an issue  . Sinoatrial node dysfunction (HCC)    [name] follows  . 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 Thoracic or lumbosacral neuritis or radiculitis,  unspecified   . Unspecified disorder of skin and subcutaneous tissue   . Unspecified essential hypertension   . Unspecified vitamin D deficiency     HPI:    Dominic Kane, 75 y.o. male, has a history of pain and functional disability in the left knee due to arthritis and has failed non-surgical conservative treatments for greater than 12 weeks to includeNSAID's and/or analgesics, corticosteriod injections, use of assistive devices and activity modification.  Onset of symptoms was gradual, starting >10 years ago with gradually worsening course since that time. The patient noted prior procedures on the knee to include  menisectomy on the left knee(s).  Patient currently rates pain in the left knee(s) at 8 out of 10 with activity. Patient has night pain, worsening of pain with activity and weight bearing, pain that interferes with activities of daily living, pain with passive range of motion, crepitus and joint swelling.  Patient has evidence of periarticular osteophytes and joint space narrowing by imaging studies.  There is no active infection.   Risks, benefits and expectations were discussed with the patient.  Risks including but not limited to the risk of anesthesia, blood clots, nerve damage, blood vessel damage, failure of the prosthesis, infection and up to and including death.  Patient understand the risks, benefits and expectations and wishes to proceed with surgery.   PCP: Eliezer Lofts, MD   Discharged Condition: good  Hospital Course:  Patient underwent the above stated procedure  on 03/18/2016. Patient tolerated the procedure well and brought to the recovery room in good condition and subsequently to the floor.  POD #1 BP: 109/68 ; Pulse: 43 ; Temp: 97.5 F (36.4 C) ; Resp: 18 Patient reports pain as mild, pain controlled.  No events throughout the night.  Looking forward to working with PT.  Ready to be discharged home.  Dorsiflexion/plantar flexion intact, incision: dressing C/D/I, no  cellulitis present and compartment soft.   LABS  Basename    HGB     10.4  HCT     30.9    Discharge Exam: General appearance: alert, cooperative and no distress Extremities: Homans sign is negative, no sign of DVT, no edema, redness or tenderness in the calves or thighs and no ulcers, gangrene or trophic changes  Disposition: Home with follow up in 2 weeks   Follow-up Information    Mauri Pole, MD. Schedule an appointment as soon as possible for a visit in 2 week(s).   Specialty:  Orthopedic Surgery Contact information: [address] [place] [address] [id number]           Discharge Instructions    Call MD / Call 911    Complete by:  As directed    If you experience chest pain or shortness of breath, CALL 911 and be transported to the hospital emergency room.  If you develope a fever above 101 F, pus (white drainage) or increased drainage or redness at the wound, or calf pain, call your surgeon's office.   Change dressing    Complete by:  As directed    Maintain surgical dressing until follow up in the clinic. If the edges start to pull up, may reinforce with tape. If the dressing is no longer working, may remove and cover with gauze and tape, but must keep the area dry and clean.  Call with any questions or concerns.   Constipation Prevention    Complete by:  As directed    Drink plenty of fluids.  Prune juice may be helpful.  You may use a stool softener, such as Colace (over the counter) 100 mg twice a day.  Use MiraLax (over the counter) for constipation as needed.   Diet - low sodium heart healthy    Complete by:  As directed    Discharge instructions    Complete by:  As directed    Maintain surgical dressing until follow up in the clinic. If the edges start to pull up, may reinforce with tape. If the dressing is no longer working, may remove and cover with gauze and tape, but must keep the area dry and clean.  Follow up in 2 weeks at  [facility]. Call with any questions or concerns.   Increase activity slowly as tolerated    Complete by:  As directed    Weight bearing as tolerated with assist device (walker, cane, etc) as directed, use it as long as suggested by your surgeon or therapist, typically at least 4-6 weeks.   TED hose    Complete by:  As directed    Use stockings (TED hose) for 2 weeks on both leg(s).  You may remove them at night for sleeping.      Allergies as of 03/19/2016      Reactions   Codeine Nausea Only   Sulfonamide Derivatives Hives      Medication List    STOP taking these medications   aspirin EC 81 MG tablet Replaced by:  aspirin 81 MG chewable tablet   diclofenac sodium 1 % Gel Commonly known as:  VOLTAREN     TAKE these medications   aspirin 81 MG chewable tablet Chew 1 tablet (81 mg total) by mouth 2 (two) times daily. Take for 4 weeks, then resume regular dose. Replaces:  aspirin EC 81 MG tablet   BREATHE RIGHT LARGE Strp Place 1 strip onto the skin daily as needed (for nasal congestion.).   docusate sodium 100 MG capsule Commonly known as:  COLACE Take 1 capsule (100 mg total) by mouth 2 (two) times daily.   ferrous sulfate 325 (65 FE) MG tablet Take 1 tablet (325 mg total) by mouth 3 (three) times daily after meals.   finasteride 5 MG tablet Commonly known as:  PROSCAR TAKE ONE TABLET BY MOUTH ONE TIME DAILY   furosemide 40 MG tablet Commonly known as:  LASIX Take 1 tablet (40 mg total) by mouth daily.   glucose blood test strip Commonly known as:  ONE TOUCH ULTRA TEST Use to check blood sugar two times a day. Dx: E11.9   HYDROcodone-acetaminophen 7.5-325 MG tablet Commonly known as:  NORCO Take 1-2 tablets by mouth every 4 (four) hours as needed for moderate pain.   KLS ALLER-TEC PO Take 10 mg by mouth daily.   Melatonin 5 MG Tabs Take 5 mg by mouth at bedtime as needed (for sleep).   methocarbamol 500 MG tablet Commonly known as:   ROBAXIN Take 1 tablet (500 mg total) by mouth every 6 (six) hours as needed for muscle spasms.   omeprazole 40 MG capsule Commonly known as:  PRILOSEC TAKE 1 CAPSULE DAILY What changed:  See the new instructions.   ONETOUCH DELICA LANCETS 99991111 Misc Use to check blood sugar two times a day. Dx: E11.9   polyethylene glycol packet Commonly known as:  MIRALAX / GLYCOLAX Take 17 g by mouth 2 (two) times daily.   PRESERVISION AREDS 2 Caps Take 1 capsule by mouth 2 (two) times daily.   ramipril 2.5 MG capsule Commonly known as:  ALTACE Take 1 capsule (2.5 mg total) by mouth daily.   rosuvastatin 10 MG tablet Commonly known as:  CRESTOR Take 1 tablet (10 mg total) by mouth daily. What changed:  when to take this   terazosin 10 MG capsule Commonly known as:  HYTRIN TAKE 1 CAPSULE DAILY        Signed: [place] Pugh. Makell Cyr   PA-C  03/20/2016, 12:54 PM

## 2016-03-21 ENCOUNTER — Other Ambulatory Visit: Payer: Self-pay | Admitting: Family Medicine

## 2016-03-21 DIAGNOSIS — M25562 Pain in left knee: Secondary | ICD-10-CM | POA: Diagnosis not present

## 2016-03-26 DIAGNOSIS — M25562 Pain in left knee: Secondary | ICD-10-CM | POA: Diagnosis not present

## 2016-03-27 ENCOUNTER — Ambulatory Visit: Payer: Medicare Other | Admitting: Cardiovascular Disease

## 2016-03-28 DIAGNOSIS — M25562 Pain in left knee: Secondary | ICD-10-CM | POA: Diagnosis not present

## 2016-03-31 DIAGNOSIS — M25562 Pain in left knee: Secondary | ICD-10-CM | POA: Diagnosis not present

## 2016-04-02 DIAGNOSIS — Z96652 Presence of left artificial knee joint: Secondary | ICD-10-CM | POA: Diagnosis not present

## 2016-04-02 DIAGNOSIS — M25562 Pain in left knee: Secondary | ICD-10-CM | POA: Diagnosis not present

## 2016-04-02 DIAGNOSIS — Z471 Aftercare following joint replacement surgery: Secondary | ICD-10-CM | POA: Diagnosis not present

## 2016-04-07 DIAGNOSIS — M25562 Pain in left knee: Secondary | ICD-10-CM | POA: Diagnosis not present

## 2016-04-14 DIAGNOSIS — M25562 Pain in left knee: Secondary | ICD-10-CM | POA: Diagnosis not present

## 2016-04-17 DIAGNOSIS — M25562 Pain in left knee: Secondary | ICD-10-CM | POA: Diagnosis not present

## 2016-04-21 DIAGNOSIS — M25562 Pain in left knee: Secondary | ICD-10-CM | POA: Diagnosis not present

## 2016-04-30 DIAGNOSIS — Z471 Aftercare following joint replacement surgery: Secondary | ICD-10-CM | POA: Diagnosis not present

## 2016-04-30 DIAGNOSIS — Z96652 Presence of left artificial knee joint: Secondary | ICD-10-CM | POA: Diagnosis not present

## 2016-04-30 DIAGNOSIS — M25562 Pain in left knee: Secondary | ICD-10-CM | POA: Diagnosis not present

## 2016-05-12 ENCOUNTER — Encounter (INDEPENDENT_AMBULATORY_CARE_PROVIDER_SITE_OTHER): Payer: Medicare Other | Admitting: Ophthalmology

## 2016-05-12 DIAGNOSIS — H35342 Macular cyst, hole, or pseudohole, left eye: Secondary | ICD-10-CM | POA: Diagnosis not present

## 2016-05-12 DIAGNOSIS — H353131 Nonexudative age-related macular degeneration, bilateral, early dry stage: Secondary | ICD-10-CM | POA: Diagnosis not present

## 2016-05-12 DIAGNOSIS — I10 Essential (primary) hypertension: Secondary | ICD-10-CM | POA: Diagnosis not present

## 2016-05-12 DIAGNOSIS — H43813 Vitreous degeneration, bilateral: Secondary | ICD-10-CM

## 2016-05-12 DIAGNOSIS — E113293 Type 2 diabetes mellitus with mild nonproliferative diabetic retinopathy without macular edema, bilateral: Secondary | ICD-10-CM

## 2016-05-12 DIAGNOSIS — H35033 Hypertensive retinopathy, bilateral: Secondary | ICD-10-CM

## 2016-05-12 DIAGNOSIS — E11319 Type 2 diabetes mellitus with unspecified diabetic retinopathy without macular edema: Secondary | ICD-10-CM | POA: Diagnosis not present

## 2016-05-12 DIAGNOSIS — H34811 Central retinal vein occlusion, right eye, with macular edema: Secondary | ICD-10-CM

## 2016-05-12 LAB — HM DIABETES EYE EXAM

## 2016-05-16 ENCOUNTER — Encounter: Payer: Self-pay | Admitting: Family Medicine

## 2016-05-29 ENCOUNTER — Encounter: Payer: Self-pay | Admitting: Cardiovascular Disease

## 2016-06-05 ENCOUNTER — Ambulatory Visit (INDEPENDENT_AMBULATORY_CARE_PROVIDER_SITE_OTHER): Payer: Medicare Other | Admitting: Cardiovascular Disease

## 2016-06-05 ENCOUNTER — Encounter: Payer: Self-pay | Admitting: Cardiovascular Disease

## 2016-06-05 VITALS — BP 120/64 | HR 70 | Ht 73.0 in | Wt 210.0 lb

## 2016-06-05 DIAGNOSIS — E7800 Pure hypercholesterolemia, unspecified: Secondary | ICD-10-CM | POA: Diagnosis not present

## 2016-06-05 DIAGNOSIS — I5022 Chronic systolic (congestive) heart failure: Secondary | ICD-10-CM

## 2016-06-05 DIAGNOSIS — I428 Other cardiomyopathies: Secondary | ICD-10-CM | POA: Diagnosis not present

## 2016-06-05 DIAGNOSIS — I2510 Atherosclerotic heart disease of native coronary artery without angina pectoris: Secondary | ICD-10-CM | POA: Diagnosis not present

## 2016-06-05 DIAGNOSIS — I712 Thoracic aortic aneurysm, without rupture, unspecified: Secondary | ICD-10-CM

## 2016-06-05 LAB — BASIC METABOLIC PANEL
BUN/Creatinine Ratio: 18 (ref 10–24)
BUN: 18 mg/dL (ref 8–27)
CALCIUM: 9.4 mg/dL (ref 8.6–10.2)
CHLORIDE: 101 mmol/L (ref 96–106)
CO2: 25 mmol/L (ref 18–29)
Creatinine, Ser: 1.02 mg/dL (ref 0.76–1.27)
GFR calc Af Amer: 83 mL/min/{1.73_m2} (ref >59)
GFR calc non Af Amer: 72 mL/min/{1.73_m2} (ref >59)
Glucose: 92 mg/dL (ref 65–99)
POTASSIUM: 4.5 mmol/L (ref 3.5–5.2)
SODIUM: 142 mmol/L (ref 134–144)

## 2016-06-05 NOTE — Progress Notes (Signed)
i  Chief Complaint  Patient presents with  . atherosclerosis of native coronary artery of native heart wi    follow up    History of Present Illness: 76 yo white male with a past medical history significant for diastolic and systolic CHF, hypertension, nonobstructive coronary artery disease, non-ischemic cardiomyopathy, benign prostatic hypertrophy, hyperlipidemia and hiatal hernia, who presents today for routine cardiac followup. Prior to 2005, he was known to have LVEF around 35%. He had an episode of chest pain in 2005 leading to a cardiac cath May 2005 which showed diffuse 15% irregularities in the LAD, diffuse 20% irregularities in the ramus intermedius branch, diffuse 30% irregularities in the nondominant circumflex, and a 50% stenosis in the proximal RCA with irregularities in the midportion of the vessel. The ascending aorta was mildly dilated with no evidence of dissection during that catheterization. Ejection fraction was noted to be 65%. An echocardiogram during that admission in May 2005 in [place] showed an ejection fraction of 55-60% with the ascending aorta at the sinotubular junction measuring 4.1 cm. LVEF has been noted to be around 45% on studies in 2010, 2011. Echo September 2013 suggested LVEF was around 30%. Cardiac MRI December 2014 showed mild enlargement of the aortic root (4.2 cm) and and EF of 44%. Last stress test 9/13 without ischemia.   He is here today for follow up. He denies chest pain, dyspnea, LE edema, near syncope, syncope, palpitations, orthopnea, PND.    Primary Care Physician: Eliezer Lofts, MD   Past Medical History:  Diagnosis Date  . Abnormality of gait   . Acute sinusitis, unspecified   . Allergy   . 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 Aortic valve disorders   . Arthritis    Osteoarthritis-bilateral knees, lower back issues occasionaly related to knee issues  . Benign paroxysmal positional vertigo    not in a long time  .  Cataract    resolved  . CHF (congestive heart failure) ([place])   . Coronary atherosclerosis of native coronary artery   . Diabetes mellitus without complication ([place])    Diet control only.  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 Diverticulosis of colon (without mention of hemorrhage)   . Dysfunction of eustachian tube    Hard of hearing"bilateral hearing aids"  . Enlarged prostate   . GERD (gastroesophageal reflux disease)   . Headache(784.0)   . History of kidney stones    past hx. 15 yrs ago x1  . Hypertrophy of prostate without urinary obstruction and other lower urinary tract symptoms (LUTS)   . Lipoprotein deficiencies   . Other and unspecified hyperlipidemia   . Other primary cardiomyopathies   . Pain in joint, lower leg   . Personal history of colonic polyps   . Personal history of other diseases of digestive system   . Pes anserinus tendinitis or bursitis    left shoulder remains an issue  . Sinoatrial node dysfunction (HCC)    [name] follows  . 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 Thoracic or lumbosacral neuritis or radiculitis, unspecified   . Unspecified disorder of skin and subcutaneous tissue   . Unspecified essential hypertension   . Unspecified vitamin D deficiency     Past Surgical History:  Procedure Laterality Date  . CATARACT EXTRACTION, BILATERAL    . COLONOSCOPY W/ POLYPECTOMY    . DOBUTAMINE STRESS ECHO  1/07   Lateral hypokinesis but no ischemia  .  ESOPHAGOGASTRODUODENOSCOPY  11/07   Schatzki's ring, non bleeding erosive gastropathy [place]  . KNEE SURGERY Left 1947  . Pulmonary functioning tests  1. 2003  2. 2005   1. Diminished lung capacity  2. Stable  . TONSILLECTOMY    . TOTAL KNEE ARTHROPLASTY Left 03/18/2016   Procedure: LEFT TOTAL KNEE ARTHROPLASTY;  Surgeon: Paralee Cancel, MD;  Location: [facility];  Service: Orthopedics;  Laterality: Left;    Current Outpatient Prescriptions  Medication Sig  Dispense Refill  . Cetirizine HCl (KLS ALLER-TEC PO) Take 10 mg by mouth daily.     . finasteride (PROSCAR) 5 MG tablet Take 5 mg by mouth daily.    . furosemide (LASIX) 40 MG tablet Take 1 tablet (40 mg total) by mouth daily. 30 tablet 9  . glucose blood (ONE TOUCH ULTRA TEST) test strip Use to check blood sugar two times a day. Dx: E11.9 200 each 3  . Melatonin 5 MG TABS Take 5 mg by mouth at bedtime as needed (for sleep).     . Multiple Vitamins-Minerals (PRESERVISION AREDS 2) CAPS Take 1 capsule by mouth 2 (two) times daily.    . Nasal Dilators (BREATHE RIGHT LARGE) STRP Place 1 strip onto the skin daily as needed (for nasal congestion.).    [name] omeprazole (PRILOSEC) 40 MG capsule Take 40 mg by mouth daily.    Glory Rosebush DELICA LANCETS 33I MISC Use to check blood sugar two times a day. Dx: E11.9 200 each 3  . ramipril (ALTACE) 2.5 MG capsule Take 1 capsule (2.5 mg total) by mouth daily. 90 capsule 3  . rosuvastatin (CRESTOR) 10 MG tablet Take 10 mg by mouth at bedtime.    [name] terazosin (HYTRIN) 10 MG capsule Take 10 mg by mouth at bedtime.     No current facility-administered medications for this visit.     Allergies  Allergen Reactions  . Codeine Nausea Only  . Sulfonamide Derivatives Hives    Social History   Social History  . Marital status: Married    Spouse name: N/A  . Number of children: 1  . Years of education: N/A   Occupational History  . retired Teacher, English as a foreign language Retired   Social History Main Topics  . Smoking status: Former Smoker    Quit date: 07/03/1980  . Smokeless tobacco: Never Used  . Alcohol use 0.6 - 1.2 oz/week    1 - 2 Glasses of wine per week  . Drug use: No  . Sexual activity: Not on file   Other Topics Concern  . Not on file   Social History Narrative  . No narrative on file    Family History  Problem Relation Age of Onset  . Heart failure Mother   . Coronary artery disease Mother   . Stroke Mother   . Cancer Father     LUNG  . Cancer  Sister     BREAST  . Heart failure Maternal Grandmother   . Colon cancer Neg Hx     Review of Systems:  As stated in the HPI and otherwise negative.   BP 120/64   Pulse 70   Ht 6\' 1"  (1.854 m)   Wt 210 lb (95.3 kg)   BMI 27.71 kg/m   Physical Examination: General: Well developed, well nourished, NAD  HEENT: OP clear, mucus membranes moist  SKIN: warm, dry. No rashes. Neuro: No focal deficits  Musculoskeletal: Muscle strength 5/5 all ext  Psychiatric: Mood and affect normal  Neck: No JVD,  no carotid bruits, no thyromegaly, no lymphadenopathy.  Lungs:Clear bilaterally, no wheezes, rhonci, crackles Cardiovascular: Regular rate and rhythm. No murmurs, gallops or rubs. Abdomen:Soft. Bowel sounds present. Non-tender.  Extremities: No lower extremity edema. Pulses are 2 + in the bilateral DP/PT.  Echo 12/10/11: Left ventricle: The cavity size was normal. Wall thickness was increased in a pattern of mild LVH. The estimated ejection fraction was 30%. Diffuse hypokinesis. Doppler parameters are consistent with abnormal left ventricular relaxation (grade 1 diastolic dysfunction). - Aortic valve: There was no stenosis. Trivial regurgitation. - Aorta: Ascending aorta dimension: 43 mm. Mildly dilated aortic root and ascending aorta. Aortic root dimension:61mm (ED). - Mitral valve: Trivial regurgitation. - Left atrium: The atrium was mildly dilated. - Right ventricle: The cavity size was normal. Systolic function was mildly to moderately reduced. - Tricuspid valve: Peak RV-RA gradient: 69mm Hg (S). - Pulmonary arteries: PA peak pressure: 85mm Hg (S). - Systemic veins: IVC measured 1.9 cm with some respirophasic variation, suggesting RA pressure 10 mmHg. Impressions:  - Normal LV size with mild LV hypertrophy. EF 30% with diffuse hypokinesis. Normal RV size with mild to moderate hypokinesis. No significant valvular abnormalities. Dilation of the aortic root and ascending aorta.  Would consider MRA of the chest to further delineate the thoracic aorta.  Cardiac MRI 02/22/13: On limited views of the lung fields, there were no gross abnormalities. Normal left ventricular size with normal wall thickness. There was global hypokinesis with EF calculated to be 44%. The right ventricle was normal in size with mild systolic dysfunction. The left atrium was mildly dilated. The right atrium was normal in size. The aortic valve was trileaflet. It opened well, I do not think that there was significant aortic stenosis. There was trivial aortic insufficiency. Significant mitral regurgitation was not noted. On delayed enhancement imaging, there was no myocardial delayed enhancement. On MRA of the chest, the aortic root and ascending aorta were mildly dilated. The arch and descending aorta were normal in caliber. The arch vessels originated normally. The pulmonary veins drained normally to the left atrium. Measurements: LV EDV 146 mL LV SV 64 mL LV EF 44% Aortic root 4.2 cm Ascending aorta 4.1 cm (maximal diameter) Arch 3.3 cm Descending thoracic aorta 3.0 cm IMPRESSION: 1. Normal LV size with mild global hypokinesis, EF 44%. There was no myocardial delayed enhancement so no definitive evidence for prior myocardial infarction, infiltrative disease, or myocarditis. 2. Normal RV size with mildly decreased systolic function. 3. Mild aortic root (4.2 cm) and ascending thoracic aorta (4.1 cm) Dilation.  EKG:  EKG is not ordered today. The ekg ordered today demonstrates   Recent Labs: 10/15/2015: ALT 14 03/19/2016: BUN 21; Creatinine, Ser 0.99; Hemoglobin 10.4; Platelets 156; Potassium 4.3; Sodium 133   Lipid Panel    Component Value Date/Time   CHOL 117 10/15/2015 1009   TRIG 75.0 10/15/2015 1009   HDL 38.50 (L) 10/15/2015 1009   CHOLHDL 3 10/15/2015 1009   VLDL 15.0 10/15/2015 1009   LDLCALC 64 10/15/2015 1009     Wt Readings from Last 3 Encounters:  06/05/16  210 lb (95.3 kg)  03/18/16 205 lb 0.4 oz (93 kg)  03/11/16 212 lb 4 oz (96.3 kg)     Other studies Reviewed: Additional studies/ records that were reviewed today include: . Review of the above records demonstrates:    Assessment and Plan:   1. CAD without angina: No chest pains suggestive of angina. Normal stress test September 2013. He is known to have  non-obstructive CAD by cath 2005. Continue ASA and statin.   2. Non-ischemic Cardiomyopathy: LVEF 44% by cardiac MRI December 2014. Continue Ace-inh. No beta blocker with bradycardia.   3. Thoracic aortic aneurysm:  Last imaging December 2014 by MRI, stable.   Will repeat every three years since it has been stable since 2005. Will repeat testing now. Will arrange CTA chest since he has had recent knee replacement surgery. Will check BMET today.   4. HLD: Continue statin. LDL at goal July 2017  5. Chronic systolic CHF: Volume status is ok today. He is Extra Lasix as needed for weight gain.   6. Carotid bruit, right: Carotid dopplers January 2016 with no evidence of carotid artery disease.   7. Diabetes, type 2: diet controlled. Followed in primary care.   Current medicines are reviewed at length with the patient today.  The patient does not have concerns regarding medicines.  The following changes have been made:  no change  Labs/ tests ordered today include:   Orders Placed This Encounter  Procedures  . CT ANGIO CHEST AORTA W &/OR WO CONTRAST  . Basic Metabolic Panel (BMET)     Disposition:   FU with me in 12 months   Signed, Lauree Chandler, MD 06/05/2016 1:40 PM    [address], [place], [place]  [address] Phone: [phone]; Fax: [phone]

## 2016-06-05 NOTE — Patient Instructions (Signed)
Medication Instructions:  Your physician recommends that you continue on your current medications as directed. Please refer to the Current Medication list given to you today.   Labwork: Lab work to be done today-BMP  Testing/Procedures: Non-Cardiac CT scanning, (CAT scanning), is a noninvasive, special x-ray that produces cross-sectional images of the body using x-rays and a computer. CT scans help physicians diagnose and treat medical conditions. For some CT exams, a contrast material is used to enhance visibility in the area of the body being studied. CT scans provide greater clarity and reveal more details than regular x-ray exams.    Follow-Up: Your physician recommends that you schedule a follow-up appointment in: 12 months. Please call our office in about 9 months to schedule this appointment.     Any Other Special Instructions Will Be Listed Below (If Applicable).     If you need a refill on your cardiac medications before your next appointment, please call your pharmacy.

## 2016-06-10 ENCOUNTER — Ambulatory Visit (INDEPENDENT_AMBULATORY_CARE_PROVIDER_SITE_OTHER): Payer: Medicare Other | Admitting: Family Medicine

## 2016-06-10 ENCOUNTER — Encounter: Payer: Self-pay | Admitting: Family Medicine

## 2016-06-10 VITALS — BP 110/70 | HR 62 | Temp 98.2°F | Ht 73.0 in | Wt 210.5 lb

## 2016-06-10 DIAGNOSIS — I509 Heart failure, unspecified: Secondary | ICD-10-CM

## 2016-06-10 DIAGNOSIS — I10 Essential (primary) hypertension: Secondary | ICD-10-CM | POA: Diagnosis not present

## 2016-06-10 DIAGNOSIS — I2510 Atherosclerotic heart disease of native coronary artery without angina pectoris: Secondary | ICD-10-CM | POA: Diagnosis not present

## 2016-06-10 DIAGNOSIS — B351 Tinea unguium: Secondary | ICD-10-CM | POA: Insufficient documentation

## 2016-06-10 DIAGNOSIS — E11319 Type 2 diabetes mellitus with unspecified diabetic retinopathy without macular edema: Secondary | ICD-10-CM

## 2016-06-10 LAB — HM DIABETES FOOT EXAM

## 2016-06-10 NOTE — Progress Notes (Signed)
Pre visit review using our clinic review tool, if applicable. No additional management support is needed unless otherwise documented below in the visit note. 

## 2016-06-10 NOTE — Assessment & Plan Note (Signed)
Euvolemic on ACEI and diuretic.

## 2016-06-10 NOTE — Patient Instructions (Addendum)
Stop fruit juice. Try to increase water. Work on low [employer]. Work on increasing exercise as able.   Please stop at the lab to set up to have labs drawn.

## 2016-06-10 NOTE — Assessment & Plan Note (Signed)
Well controlled. Continue current medication.  

## 2016-06-10 NOTE — Assessment & Plan Note (Addendum)
Followed by opthalmology.       

## 2016-06-10 NOTE — Progress Notes (Signed)
   Subjective:    Patient ID: Dominic Kane, male    DOB: 02-14-1941, 76 y.o.   MRN: [id number]  HPI    76 year old male with history for CAD, CHF, DM, HTN presents for elevated blood sugars.  Diabetes:    Previously well controlled on no medication. He was told in hospital blood sugar was elevated. Lab Results  Component Value Date   HGBA1C 6.2 (H) 03/11/2016  Using medications without difficulties: Hypoglycemic episodes: none Hyperglycemic episodes: none Feet problems: has noted dark spot under toenail on.. Not sure if hit toenails. No ucler Blood Sugars averaging: FBS 106-116 Eye exam within last year: yes.. Has retinopathy.    He has not been eating very healthy lately over the winter.  Has issues with portion size.  Exercise: active at work   He had left TKR.. 02/2016.[name]  Blood pressure 110/70, pulse 62, temperature 98.2 F (36.8 C), temperature source Oral, height 6\' 1"  (1.854 m), weight 210 lb 8 oz (95.5 kg).  Wt Readings from Last 3 Encounters:  06/10/16 210 lb 8 oz (95.5 kg)  06/05/16 210 lb (95.3 kg)  03/18/16 205 lb 0.4 oz (93 kg)     Review of Systems  Constitutional: Negative for fatigue and fever.  HENT: Negative for ear pain.   Eyes: Negative for pain.  Respiratory: Negative for cough, shortness of breath and wheezing.   Cardiovascular: Negative for chest pain, palpitations and leg swelling.       Objective:   Physical Exam  Constitutional: Vital signs are normal. He appears well-developed and well-nourished.  HENT:  Head: Normocephalic.  Right Ear: Hearing normal.  Left Ear: Hearing normal.  Nose: Nose normal.  Mouth/Throat: Oropharynx is clear and moist and mucous membranes are normal.  Neck: Trachea normal. Carotid bruit is not present. No thyroid mass and no thyromegaly present.  Cardiovascular: Normal rate, regular rhythm and normal pulses.  Exam reveals no gallop, no distant heart sounds and no friction rub.   No murmur heard. No  peripheral edema  Pulmonary/Chest: Effort normal and breath sounds normal. No respiratory distress.  Skin: Skin is warm, dry and intact. No rash noted.  Psychiatric: He has a normal mood and affect. His speech is normal and behavior is normal. Thought content normal.    Diabetic foot exam: Normal inspection No skin breakdown No calluses  Normal DP pulses Normal sensation to light touch and monofilament Nails thickened and with subungual debris on right, not on left except for great toe, 4th digit with contusion under nail bed.         Assessment & Plan:

## 2016-06-10 NOTE — Assessment & Plan Note (Signed)
Due repeat A1C.  Discussed lifestyle changes: increase exercise and weight loss and decrease carbs in diet.

## 2016-06-10 NOTE — Assessment & Plan Note (Signed)
Pt not interested in oral treatment. Will try OTC antifungal nail polish.

## 2016-06-11 ENCOUNTER — Ambulatory Visit (INDEPENDENT_AMBULATORY_CARE_PROVIDER_SITE_OTHER)
Admission: RE | Admit: 2016-06-11 | Discharge: 2016-06-11 | Disposition: A | Discharge status: Home / Self Care | Payer: Medicare Other | Source: Ambulatory Visit | Attending: Cardiovascular Disease | Admitting: Cardiovascular Disease

## 2016-06-11 DIAGNOSIS — I712 Thoracic aortic aneurysm, without rupture, unspecified: Secondary | ICD-10-CM

## 2016-06-11 LAB — COMPREHENSIVE METABOLIC PANEL
ALK PHOS: 83 U/L (ref 39–117)
ALT: 12 U/L (ref 0–53)
AST: 16 U/L (ref 0–37)
Albumin: 4.2 g/dL (ref 3.5–5.2)
BILIRUBIN TOTAL: 0.8 mg/dL (ref 0.2–1.2)
BUN: 25 mg/dL — ABNORMAL HIGH (ref 6–23)
CO2: 27 meq/L (ref 19–32)
CREATININE: 1.02 mg/dL (ref 0.40–1.50)
Calcium: 9.5 mg/dL (ref 8.4–10.5)
Chloride: 104 mEq/L (ref 96–112)
GFR: 75.59 mL/min (ref >60.00)
GLUCOSE: 87 mg/dL (ref 70–99)
Potassium: 4.4 mEq/L (ref 3.5–5.1)
Sodium: 139 mEq/L (ref 135–145)
TOTAL PROTEIN: 6.9 g/dL (ref 6.0–8.3)

## 2016-06-11 LAB — HEMOGLOBIN A1C: HEMOGLOBIN A1C: 6.4 % (ref 4.6–6.5)

## 2016-06-11 MED ORDER — IOPAMIDOL (ISOVUE-370) INJECTION 76%
100.0000 mL | Freq: Once | INTRAVENOUS | Status: AC | PRN
  Administered 2016-06-11: 100 mL via INTRAVENOUS

## 2016-06-13 ENCOUNTER — Telehealth: Payer: Self-pay | Admitting: *Deleted

## 2016-06-13 DIAGNOSIS — E041 Nontoxic single thyroid nodule: Secondary | ICD-10-CM

## 2016-06-13 NOTE — Telephone Encounter (Signed)
[name] notified as instructed by telephone.  He is agreeable to Thyroid U/S.  He is scheduled to see Dr. Diona Browner on 06/19/2016 so we can draw his thyroid labs at that visit.

## 2016-06-13 NOTE — Addendum Note (Signed)
Addended byEliezer Lofts E on: 06/13/2016 01:10 PM   Modules accepted: Orders

## 2016-06-13 NOTE — Telephone Encounter (Signed)
-----   Message from Jinny Sanders, MD sent at 06/12/2016  5:19 PM EDT -----  Let pt know a nodule was seen incidentally on his thyroid. Let me know if he is agreeable to a thyroid US. And schedule him for labs to check free t3 , free t4 and TSH. ----- Message ----- From: Burnell Blanks, MD Sent: 06/12/2016  10:34 AM To: Jinny Sanders, MD  Good morning Amy,   Mr. Tomberlin had his f/u chest CTA. His aortic aneurysm is stable but he has a thyroid nodule. Can you take a look at this and see if you think he needs an u/s to better assess this? Thanks, chris

## 2016-06-17 ENCOUNTER — Ambulatory Visit
Admission: RE | Admit: 2016-06-17 | Discharge: 2016-06-17 | Disposition: A | Discharge status: Home / Self Care | Payer: Medicare Other | Source: Ambulatory Visit | Attending: Family Medicine | Admitting: Family Medicine

## 2016-06-17 DIAGNOSIS — E042 Nontoxic multinodular goiter: Secondary | ICD-10-CM | POA: Insufficient documentation

## 2016-06-17 DIAGNOSIS — E041 Nontoxic single thyroid nodule: Secondary | ICD-10-CM | POA: Diagnosis present

## 2016-06-19 ENCOUNTER — Encounter: Payer: Self-pay | Admitting: Family Medicine

## 2016-06-19 ENCOUNTER — Ambulatory Visit (INDEPENDENT_AMBULATORY_CARE_PROVIDER_SITE_OTHER): Payer: Medicare Other | Admitting: Family Medicine

## 2016-06-19 ENCOUNTER — Ambulatory Visit: Payer: Medicare Other

## 2016-06-19 VITALS — BP 110/76 | HR 76 | Temp 97.2°F | Wt 210.0 lb

## 2016-06-19 DIAGNOSIS — E042 Nontoxic multinodular goiter: Secondary | ICD-10-CM

## 2016-06-19 DIAGNOSIS — I2510 Atherosclerotic heart disease of native coronary artery without angina pectoris: Secondary | ICD-10-CM

## 2016-06-19 LAB — T3, FREE: T3, Free: 3 pg/mL (ref 2.3–4.2)

## 2016-06-19 LAB — TSH: TSH: 2.79 mIU/L (ref 0.40–4.50)

## 2016-06-19 LAB — T4, FREE: Free T4: 0.9 ng/dL (ref 0.8–1.8)

## 2016-06-19 NOTE — Progress Notes (Signed)
   Subjective:    Patient ID: Dominic Kane, male    DOB: November 17, 1940, 76 y.o.   MRN: [id number]  HPI    76 year old male with recent abd CT showing thyroid nodule.  [place] of thyroid results showed  Multiple nodules 3 needing biopsy. Impression The isthmic nodule (labeled 1) and the superior left thyroid nodule (labeled 1) both meet criteria for biopsy, as designated by the newly established ACR TI-RADS criteria, and referral for biopsy is recommended.  Right inferior thyroid nodule (labeled 1) meets criteria for surveillance, as designated by the newly established ACR TI-RADS criteria. Surveillance ultrasound study recommended to be performed annually up to 5 years.  Today he reports:  Tired in afternoon. No cold or heat intolerance.  no palitations, no shakiness.  No C/D.  Review of Systems  Constitutional: Positive for fatigue. Negative for fever.  HENT: Negative for ear pain.   Eyes: Negative for pain.  Respiratory: Negative for shortness of breath.   Cardiovascular: Negative for chest pain.       Objective:   Physical Exam  Constitutional: Vital signs are normal. He appears well-developed and well-nourished.  HENT:  Head: Normocephalic.  Right Ear: Hearing normal.  Left Ear: Hearing normal.  Nose: Nose normal.  Mouth/Throat: Oropharynx is clear and moist and mucous membranes are normal.  Neck: Trachea normal. Carotid bruit is not present. No thyroid mass and no thyromegaly present.  No thyroid nosules palpated  Cardiovascular: Normal rate, regular rhythm and normal pulses.  Exam reveals no gallop, no distant heart sounds and no friction rub.   No murmur heard. No peripheral edema  Pulmonary/Chest: Effort normal and breath sounds normal. No respiratory distress.  Skin: Skin is warm, dry and intact. No rash noted.  Psychiatric: He has a normal mood and affect. His speech is normal and behavior is normal. Thought content normal.          Assessment & Plan:

## 2016-06-19 NOTE — Progress Notes (Signed)
Pre visit review using our clinic review tool, if applicable. No additional management support is needed unless otherwise documented below in the visit note. 

## 2016-06-19 NOTE — Assessment & Plan Note (Signed)
Eval thyroid function. Refer to ENDO.

## 2016-06-19 NOTE — Patient Instructions (Signed)
Please stop at the lab to set up to have labs drawn.  Please stop at the front desk to set up referral.

## 2016-07-09 ENCOUNTER — Encounter: Payer: Self-pay | Admitting: Endocrinology

## 2016-07-09 ENCOUNTER — Ambulatory Visit (INDEPENDENT_AMBULATORY_CARE_PROVIDER_SITE_OTHER): Payer: Medicare Other | Admitting: Endocrinology

## 2016-07-09 VITALS — BP 126/72 | HR 58 | Ht 73.0 in | Wt 211.0 lb

## 2016-07-09 DIAGNOSIS — I2510 Atherosclerotic heart disease of native coronary artery without angina pectoris: Secondary | ICD-10-CM

## 2016-07-09 DIAGNOSIS — E042 Nontoxic multinodular goiter: Secondary | ICD-10-CM | POA: Diagnosis not present

## 2016-07-09 NOTE — Patient Instructions (Signed)
Let's do the biopsy.  you will receive a phone call, about a day and time for an appointment.  We'll let you know about the results. If as expected, no cancer is found, Please come back for a follow-up appointment in 6-12 months.

## 2016-07-09 NOTE — Progress Notes (Signed)
Subjective:    Patient ID: Dominic Kane, male    DOB: 12/23/1940, 76 y.o.   MRN: [id number]  HPI Pt is referred by Dr Diona Browner, for nodular thyroid.  1 month ago, on CT scan, pt was incidentally noted to have a thyroid nodule.  He is unaware of ever having had thyroid problems in the past.  He has no h/o XRT or surgery to the neck.  He has slight hoarseness in the neck, but no assoc cough.   Past Medical History:  Diagnosis Date  . Abnormality of gait   . Acute sinusitis, unspecified   . Allergy   . 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 Aortic valve disorders   . Arthritis    Osteoarthritis-bilateral knees, lower back issues occasionaly related to knee issues  . Benign paroxysmal positional vertigo    not in a long time  . Cataract    resolved  . CHF (congestive heart failure) ([place])   . Coronary atherosclerosis of native coronary artery   . Diabetes mellitus without complication ([place])    Diet control only.  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 Diverticulosis of colon (without mention of hemorrhage)   . Dysfunction of eustachian tube    Hard of hearing"bilateral hearing aids"  . Enlarged prostate   . GERD (gastroesophageal reflux disease)   . Headache(784.0)   . History of kidney stones    past hx. 15 yrs ago x1  . Hypertrophy of prostate without urinary obstruction and other lower urinary tract symptoms (LUTS)   . Lipoprotein deficiencies   . Other and unspecified hyperlipidemia   . Other primary cardiomyopathies   . Pain in joint, lower leg   . Personal history of colonic polyps   . Personal history of other diseases of digestive system   . Pes anserinus tendinitis or bursitis    left shoulder remains an issue  . Sinoatrial node dysfunction ([facility])    [name] follows  . 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 Thoracic or lumbosacral neuritis or radiculitis, unspecified   . Unspecified disorder of skin  and subcutaneous tissue   . Unspecified essential hypertension   . Unspecified vitamin D deficiency     Past Surgical History:  Procedure Laterality Date  . CATARACT EXTRACTION, BILATERAL    . COLONOSCOPY W/ POLYPECTOMY    . DOBUTAMINE STRESS ECHO  1/07   Lateral hypokinesis but no ischemia  . ESOPHAGOGASTRODUODENOSCOPY  11/07   Schatzki's ring, non bleeding erosive gastropathy [facility]  . KNEE SURGERY Left 1947  . Pulmonary functioning tests  1. 2003  2. 2005   1. Diminished lung capacity  2. Stable  . TONSILLECTOMY    . TOTAL KNEE ARTHROPLASTY Left 03/18/2016   Procedure: LEFT TOTAL KNEE ARTHROPLASTY;  Surgeon: Paralee Cancel, MD;  Location: [facility];  Service: Orthopedics;  Laterality: Left;    Social History   Social History  . Marital status: Married    Spouse name: N/A  . Number of children: 1  . Years of education: N/A   Occupational History  . retired Teacher, English as a foreign language Retired   Social History Main Topics  . Smoking status: Former Smoker    Quit date: 07/03/1980  . Smokeless tobacco: Never Used  . Alcohol use 0.6 - 1.2 oz/week    1 - 2 Glasses of wine per week  . Drug use: No  . Sexual activity: Not on file  Other Topics Concern  . Not on file   Social History Narrative  . No narrative on file    Current Outpatient Prescriptions on File Prior to Visit  Medication Sig Dispense Refill  . Cetirizine HCl (KLS ALLER-TEC PO) Take 10 mg by mouth daily.     . finasteride (PROSCAR) 5 MG tablet Take 5 mg by mouth daily.    . furosemide (LASIX) 40 MG tablet Take 1 tablet (40 mg total) by mouth daily. 30 tablet 9  . glucose blood (ONE TOUCH ULTRA TEST) test strip Use to check blood sugar two times a day. Dx: E11.9 200 each 3  . Melatonin 5 MG TABS Take 5 mg by mouth at bedtime as needed (for sleep).     . Multiple Vitamins-Minerals (PRESERVISION AREDS 2) CAPS Take 1 capsule by mouth 2 (two) times daily.    . Nasal Dilators (BREATHE RIGHT LARGE) STRP Place 1 strip onto  the skin daily as needed (for nasal congestion.).    [name] omeprazole (PRILOSEC) 40 MG capsule Take 40 mg by mouth daily.    Glory Rosebush DELICA LANCETS 09W MISC Use to check blood sugar two times a day. Dx: E11.9 200 each 3  . ramipril (ALTACE) 2.5 MG capsule Take 1 capsule (2.5 mg total) by mouth daily. 90 capsule 3  . rosuvastatin (CRESTOR) 10 MG tablet Take 10 mg by mouth at bedtime.    [name] terazosin (HYTRIN) 10 MG capsule Take 10 mg by mouth at bedtime.     No current facility-administered medications on file prior to visit.     Allergies  Allergen Reactions  . Codeine Nausea Only  . Sulfonamide Derivatives Hives    Family History  Problem Relation Age of Onset  . Heart failure Mother   . Coronary artery disease Mother   . Stroke Mother   . Hypothyroidism Mother   . Cancer Father     LUNG  . Cancer Sister     BREAST  . Heart failure Maternal Grandmother   . Hypothyroidism Daughter   . Colon cancer Neg Hx     BP 126/72   Pulse (!) 58   Ht 6\' 1"  (1.854 m)   Wt 211 lb (95.7 kg)   SpO2 96%   BMI 27.84 kg/m    Review of Systems Denies weight change, neck pain, diplopia, chest pain, sob, dysphagia, diarrhea, itching, flushing, easy bruising, depression, headache, numbness, and rhinorrhea.  He has cold intolerance.    Objective:   Physical Exam VS: see vs page GEN: no distress HEAD: head: no deformity eyes: no periorbital swelling, no proptosis external nose and ears are normal mouth: no lesion seen Ears: bilat HA's NECK: thyroid has multinodular surface, but I cant't tell details CHEST WALL: no deformity LUNGS: clear to auscultation CV: reg rate and rhythm, no murmur ABD: abdomen is soft, nontender.  no hepatosplenomegaly.  not distended.  no hernia MUSCULOSKELETAL: muscle bulk and strength are grossly normal.  no obvious joint swelling.  gait is normal and steady EXTEMITIES: no deformity.  no edema PULSES: no carotid bruit NEURO:  cn 2-12 grossly intact.   readily  moves all 4's.  sensation is intact to touch on all 4's SKIN:  Normal texture and temperature.  No rash or suspicious lesion is visible.   NODES:  None palpable at the neck PSYCH: alert, well-oriented.  Does not appear anxious nor depressed.  Lab Results  Component Value Date   TSH 2.79 06/19/2016   I have reviewed  outside records, and summarized: Pt was noted to have goiter, and referred here.  DM was addressed recently by Dr Diona Browner.  CT was order by cardiol, to eval TAA.    [place]: multinodular goiter.      Assessment & Plan:  Multinodular goiter, new to me.  Non-palpable.   Hoarseness, not thyroid-related.    Patient Instructions  Let's do the biopsy.  you will receive a phone call, about a day and time for an appointment.  We'll let you know about the results. If as expected, no cancer is found, Please come back for a follow-up appointment in 6-12 months.

## 2016-07-10 ENCOUNTER — Other Ambulatory Visit: Payer: Self-pay | Admitting: Endocrinology

## 2016-07-10 DIAGNOSIS — E042 Nontoxic multinodular goiter: Secondary | ICD-10-CM

## 2016-07-17 ENCOUNTER — Other Ambulatory Visit (HOSPITAL_COMMUNITY)
Admission: RE | Admit: 2016-07-17 | Discharge: 2016-07-17 | Disposition: A | Discharge status: Home / Self Care | Payer: Medicare Other | Source: Ambulatory Visit | Attending: General Surgery | Admitting: General Surgery

## 2016-07-17 ENCOUNTER — Other Ambulatory Visit: Payer: Self-pay | Admitting: Cardiovascular Disease

## 2016-07-17 ENCOUNTER — Ambulatory Visit
Admission: RE | Admit: 2016-07-17 | Discharge: 2016-07-17 | Disposition: A | Discharge status: Home / Self Care | Payer: Medicare Other | Source: Ambulatory Visit | Attending: Endocrinology | Admitting: Endocrinology

## 2016-07-17 DIAGNOSIS — E042 Nontoxic multinodular goiter: Secondary | ICD-10-CM

## 2016-07-17 DIAGNOSIS — E041 Nontoxic single thyroid nodule: Secondary | ICD-10-CM | POA: Diagnosis not present

## 2016-07-18 ENCOUNTER — Other Ambulatory Visit: Payer: Self-pay | Admitting: Endocrinology

## 2016-07-18 DIAGNOSIS — E042 Nontoxic multinodular goiter: Secondary | ICD-10-CM

## 2016-07-22 ENCOUNTER — Telehealth: Payer: Self-pay | Admitting: Endocrinology

## 2016-07-22 NOTE — Telephone Encounter (Signed)
Spoke with the patient and he stated an understanding 

## 2016-07-22 NOTE — Telephone Encounter (Signed)
Patient stated that Dominic Kane was suppose to Referrer him to a Psychologist, sport and exercise, was he able to get this taken care of. Please advise

## 2016-07-22 NOTE — Telephone Encounter (Signed)
Referral was placed last week.  you will receive a phone call, about a day and time for an appointment

## 2016-07-29 ENCOUNTER — Ambulatory Visit: Payer: Self-pay | Admitting: Surgery

## 2016-07-29 DIAGNOSIS — C73 Malignant neoplasm of thyroid gland: Secondary | ICD-10-CM | POA: Diagnosis not present

## 2016-07-29 NOTE — Progress Notes (Addendum)
[place] cardiology Dr Angelena Form 06-05-16 epic EKG 02-01-16 epic HgA1C 06-10-16 epic

## 2016-07-29 NOTE — Patient Instructions (Addendum)
Dominic Kane  07/29/2016   Your procedure is scheduled on: 08-05-16  Report to [facility] Main  Entrance Take [place]  elevators to 3rd floor to  [place] at 530AM.   Call this number if you have problems the morning of surgery [phone]    Remember: ONLY 1 PERSON MAY GO WITH YOU TO SHORT STAY TO GET  READY MORNING OF YOUR SURGERY.  Do not eat food or drink liquids :After Midnight.     Take these medicines the morning of surgery with A SIP OF WATER: none                               You may not have any metal on your body including hair pins and              piercings  Do not wear jewelry, make-up, lotions, powders or perfumes, deodorant                        Men may shave face and neck.   Do not bring valuables to the hospital. [place].  Contacts, dentures or bridgework may not be worn into surgery.  Leave suitcase in the car. After surgery it may be brought to your room.                   Please read over the following fact sheets you were given: _____________________________________________________________________             How to Manage Your Diabetes Before and After Surgery  Why is it important to control my blood sugar before and after surgery? . Improving blood sugar levels before and after surgery helps healing and can limit problems. . A way of improving blood sugar control is eating a healthy diet by: o  Eating less sugar and carbohydrates o  Increasing activity/exercise o  Talking with your doctor about reaching your blood sugar goals . High blood sugars (greater than 180 mg/dL) can raise your risk of infections and slow your recovery, so you will need to focus on controlling your diabetes during the weeks before surgery. . Make sure that the doctor who takes care of your diabetes knows about your planned surgery including the date and location.  How do I manage my blood  sugar before surgery? . Check your blood sugar at least 4 times a day, starting 2 days before surgery, to make sure that the level is not too high or low. o Check your blood sugar the morning of your surgery when you wake up and every 2 hours until you get to the Short Stay unit. . If your blood sugar is less than 70 mg/dL, you will need to treat for low blood sugar: o Do not take insulin. o Treat a low blood sugar (less than 70 mg/dL) with  cup of clear juice (cranberry or apple), 4 glucose tablets, OR glucose gel. o Recheck blood sugar in 15 minutes after treatment (to make sure it is greater than 70 mg/dL). If your blood sugar is not greater than 70 mg/dL on recheck, call [phone] for further instructions. . Report your blood sugar to the short stay nurse when you get to  Short Stay.  . If you are admitted to the hospital after surgery: o Your blood sugar will be checked by the staff and you will probably be given insulin after surgery (instead of oral diabetes medicines) to make sure you have good blood sugar levels. o The goal for blood sugar control after surgery is 80-180 mg/dL.   Patient Signature:  Date:   Nurse Signature:  Date:   Reviewed and Endorsed by [facility] Patient Education Committee, August 2015  [facility] - Preparing for Surgery Before surgery, you can play an important role.  Because skin is not sterile, your skin needs to be as free of germs as possible.  You can reduce the number of germs on your skin by washing with CHG (chlorahexidine gluconate) soap before surgery.  CHG is an antiseptic cleaner which kills germs and bonds with the skin to continue killing germs even after washing. Please DO NOT use if you have an allergy to CHG or antibacterial soaps.  If your skin becomes reddened/irritated stop using the CHG and inform your nurse when you arrive at Short Stay. Do not shave (including legs and underarms) for at least 48 hours prior to the first CHG shower.   You may shave your face/neck. Please follow these instructions carefully:  1.  Shower with CHG Soap the night before surgery and the  morning of Surgery.  2.  If you choose to wash your hair, wash your hair first as usual with your  normal  shampoo.  3.  After you shampoo, rinse your hair and body thoroughly to remove the  shampoo.                           4.  Use CHG as you would any other liquid soap.  You can apply chg directly  to the skin and wash                       Gently with a scrungie or clean washcloth.  5.  Apply the CHG Soap to your body ONLY FROM THE NECK DOWN.   Do not use on face/ open                           Wound or open sores. Avoid contact with eyes, ears mouth and genitals (private parts).                       Wash face,  Genitals (private parts) with your normal soap.             6.  Wash thoroughly, paying special attention to the area where your surgery  will be performed.  7.  Thoroughly rinse your body with warm water from the neck down.  8.  DO NOT shower/wash with your normal soap after using and rinsing off  the CHG Soap.                9.  Pat yourself dry with a clean towel.            10.  Wear clean pajamas.            11.  Place clean sheets on your bed the night of your first shower and do not  sleep with pets. Day of Surgery : Do not apply any lotions/deodorants the morning of surgery.  Please wear clean  clothes to the hospital/surgery center.  FAILURE TO FOLLOW THESE INSTRUCTIONS MAY RESULT IN THE CANCELLATION OF YOUR SURGERY   ________________________________________________________________________

## 2016-07-30 ENCOUNTER — Ambulatory Visit (HOSPITAL_COMMUNITY)
Admission: RE | Admit: 2016-07-30 | Discharge: 2016-07-30 | Disposition: A | Discharge status: Home / Self Care | Payer: Medicare Other | Source: Ambulatory Visit | Attending: Anesthesiology | Admitting: Anesthesiology

## 2016-07-30 ENCOUNTER — Encounter (HOSPITAL_COMMUNITY): Payer: Self-pay

## 2016-07-30 ENCOUNTER — Encounter (HOSPITAL_COMMUNITY)
Admission: RE | Admit: 2016-07-30 | Discharge: 2016-07-30 | Disposition: A | Discharge status: Home / Self Care | Payer: Medicare Other | Source: Ambulatory Visit | Attending: Surgery | Admitting: Surgery

## 2016-07-30 DIAGNOSIS — M47894 Other spondylosis, thoracic region: Secondary | ICD-10-CM | POA: Diagnosis not present

## 2016-07-30 DIAGNOSIS — Z01812 Encounter for preprocedural laboratory examination: Secondary | ICD-10-CM | POA: Diagnosis not present

## 2016-07-30 DIAGNOSIS — Z0389 Encounter for observation for other suspected diseases and conditions ruled out: Secondary | ICD-10-CM | POA: Diagnosis not present

## 2016-07-30 DIAGNOSIS — Z01811 Encounter for preprocedural respiratory examination: Secondary | ICD-10-CM | POA: Diagnosis not present

## 2016-07-30 DIAGNOSIS — I700 Atherosclerosis of aorta: Secondary | ICD-10-CM | POA: Diagnosis not present

## 2016-07-30 DIAGNOSIS — C73 Malignant neoplasm of thyroid gland: Secondary | ICD-10-CM

## 2016-07-30 LAB — CBC
HEMATOCRIT: 39.4 % (ref 39.0–52.0)
Hemoglobin: 13 g/dL (ref 13.0–17.0)
MCH: 29 pg (ref 26.0–34.0)
MCHC: 33 g/dL (ref 30.0–36.0)
MCV: 87.9 fL (ref 78.0–100.0)
PLATELETS: 200 10*3/uL (ref 150–400)
RBC: 4.48 MIL/uL (ref 4.22–5.81)
RDW: 14.7 % (ref 11.5–15.5)
WBC: 9.7 10*3/uL (ref 4.0–10.5)

## 2016-07-30 LAB — BASIC METABOLIC PANEL
Anion gap: 8 (ref 5–15)
BUN: 24 mg/dL — AB (ref 6–20)
CHLORIDE: 108 mmol/L (ref 101–111)
CO2: 24 mmol/L (ref 22–32)
CREATININE: 0.97 mg/dL (ref 0.61–1.24)
Calcium: 9.1 mg/dL (ref 8.9–10.3)
GFR calc Af Amer: >60 mL/min (ref >60)
GFR calc non Af Amer: >60 mL/min (ref >60)
Glucose, Bld: 111 mg/dL — ABNORMAL HIGH (ref 65–99)
POTASSIUM: 4.3 mmol/L (ref 3.5–5.1)
Sodium: 140 mmol/L (ref 135–145)

## 2016-07-30 NOTE — Progress Notes (Signed)
Patient with remarkable cardiac hx; required clearance for a previous TKA done in 02/2016; however no clearance noted on file for upcoming thyroid procedure and at pre-op appt , pt unaware of instructions for aspirin management for surgery. Note in epic on 06-13-16 indicates that cardio Dr Angelena Form found the nodule on a CT but no f/u in epic showing knowledge of any surgery to remove. RN called over to triage at [place]. Was informed no cardiac clearance on file for patient; however they would get in touch with nurse for [place] and contact this RN with any new information.

## 2016-08-04 ENCOUNTER — Encounter (HOSPITAL_COMMUNITY): Payer: Self-pay | Admitting: Surgery

## 2016-08-04 ENCOUNTER — Other Ambulatory Visit: Payer: Self-pay | Admitting: Cardiovascular Disease

## 2016-08-04 DIAGNOSIS — C73 Malignant neoplasm of thyroid gland: Secondary | ICD-10-CM | POA: Diagnosis present

## 2016-08-04 NOTE — H&P (Signed)
General Surgery [facility]  Terry Bolotin Tench 07/29/2016 10:29 AM Location: [facility] Patient #: [id number] DOB: [birth year] Married / Language: Cleophus Molt / Race: White Male   History of Present Illness Earnstine Regal MD; 07/29/2016 11:01 AM) The patient is a 76 year old male who presents with thyroid cancer.  CC: thyroid cancer  Patient is referred by Dr. 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 Patient had undergone a CT scan of the chest as part of a cardiac evaluation. Incidental finding was made of thyroid nodules. Patient subsequently underwent thyroid ultrasound which documented multiple bilateral thyroid nodules. Fine needle aspiration biopsy was recommended. Patient underwent aspiration of the dominant nodule in the thyroid isthmus. This was positive for papillary thyroid carcinoma. Patient has no prior history of thyroid disease. He has never been on thyroid medication. He has had no prior head or neck surgery. There is a family history of thyroid disease in his mother and daughter, both of whom take medication. There is no family history of thyroid cancer. There is no family history of other endocrine neoplasms. Patient denies any compressive symptoms. He denies tremors or palpitations. He presents today accompanied by his wife.   Past Surgical History Mammie Lorenzo, LPN; 08/23/7033 00:93 AM) Colon Polyp Removal - Colonoscopy  Tonsillectomy   Diagnostic Studies History Mammie Lorenzo, LPN; 10/22/8297 37:16 AM) Colonoscopy  1-5 years ago  Allergies Jerrye Bushy, [place]; 07/29/2016 10:30 AM) Sulfacetamide *CHEMICALS*  nausea Codeine Phosphate *ANALGESICS - OPIOID*  nausea  Medication History Jerrye Bushy, RMA; 07/29/2016 10:31 AM) Terazosin HCl (10MG  Capsule, Oral) Active. Rosuvastatin Calcium (10MG  Tablet, Oral) Active. Omeprazole (40MG  Capsule DR, Oral) Active. Furosemide (40MG  Tablet, Oral)  Active. Finasteride (5MG  Tablet, Oral) Active. Ramipril (2.5MG  Capsule, Oral) Active. Medications Reconciled  Social History Mammie Lorenzo, LPN; 11/28/7891 81:01 AM) Alcohol use  Moderate alcohol use. Caffeine use  Carbonated beverages, Coffee, Tea. No drug use  Tobacco use  Former smoker.  Family History Mammie Lorenzo, LPN; 09/26/1023 85:27 AM) Colon Cancer  Brother. Diabetes Mellitus  Father. Hypertension  Mother. Malignant Neoplasm Of Pancreas  Father. Thyroid problems  Mother.  Other Problems Mammie Lorenzo, LPN; 09/28/2421 53:61 AM) Diabetes Mellitus  Diverticulosis  Enlarged Prostate  Gastroesophageal Reflux Disease  High blood pressure  Hypercholesterolemia  Thyroid Cancer  Thyroid Disease     Review of Systems Claiborne Billings Dockery LPN; 06/25/3152 00:86 AM) General Not Present- Appetite Loss, Chills, Fatigue, Fever, Night Sweats, Weight Gain and Weight Loss. Skin Not Present- Change in Wart/Mole, Dryness, Hives, Jaundice, New Lesions, Non-Healing Wounds, Rash and Ulcer. HEENT Present- Hearing Loss and Seasonal Allergies. Not Present- Earache, Hoarseness, Nose Bleed, Oral Ulcers, Ringing in the Ears, Sinus Pain, Sore Throat, Visual Disturbances, Wears glasses/contact lenses and Yellow Eyes. Respiratory Not Present- Bloody sputum, Chronic Cough, Difficulty Breathing, Snoring and Wheezing. Breast Not Present- Breast Mass, Breast Pain, Nipple Discharge and Skin Changes. Cardiovascular Present- Leg Cramps. Not Present- Chest Pain, Difficulty Breathing Lying Down, Palpitations, Rapid Heart Rate, Shortness of Breath and Swelling of Extremities. Gastrointestinal Not Present- Abdominal Pain, Bloating, Bloody Stool, Change in Bowel Habits, Chronic diarrhea, Constipation, Difficulty Swallowing, Excessive gas, Gets full quickly at meals, Hemorrhoids, Indigestion, Nausea, Rectal Pain and Vomiting. Male Genitourinary Not Present- Blood in Urine, Change in Urinary Stream,  Frequency, Impotence, Nocturia, Painful Urination, Urgency and Urine Leakage. Musculoskeletal Not Present- Back Pain, Joint Pain, Joint Stiffness, Muscle Pain, Muscle Weakness and Swelling of Extremities. Neurological Not Present- Decreased Memory, Fainting, Headaches, Numbness,  Seizures, Tingling, Tremor, Trouble walking and Weakness. Psychiatric Not Present- Anxiety, Bipolar, Change in Sleep Pattern, Depression, Fearful and Frequent crying. Endocrine Present- New Diabetes. Not Present- Cold Intolerance, Excessive Hunger, Hair Changes, Heat Intolerance and Hot flashes. Hematology Present- Easy Bruising. Not Present- Blood Thinners, Excessive bleeding, Gland problems, HIV and Persistent Infections.  Vitals [employer] RMA; 07/29/2016 10:31 AM) 07/29/2016 10:31 AM Weight: 208.4 lb Height: 74in Body Surface Area: 2.21 m Body Mass Index: 26.76 kg/m  Temp.: 97.64F  Pulse: 101 (Regular)  P.OX: 98% (Room air) BP: 132/90 (Sitting, Left Arm, Standard)       Physical Exam Earnstine Regal MD; 07/29/2016 11:02 AM) The physical exam findings are as follows: Note:Constitutional: See vital signs recorded above  General appearance: normal development, nutritional status normal, no deformities  Eyes: conjunctiva and lids normal without lesion; pupils equal and reactive; iris normal bilaterally  Ears, nose, mouth, throat: external exam without lesion or mass; hearing grossly normal; lips normal; teeth normal for age; gums without obvious disease; oral mucosa moist, tongue normal  Neck: symmetric on extension; trachea midline; no crepitance; no masses are palpable on exam  Thyroid: normal in size, no palpable nodules, no tenderness  Chest - clear bilaterally without rhonchi, rales, or wheeze  Cor - regular rhythm with normal rate; no significant murmur  Ext - non-tender without significant edema or lymphedema  Neuro - grossly intact; no tremor    Assessment & Plan Earnstine Regal  MD; 07/29/2016 11:04 AM) PAPILLARY THYROID CARCINOMA (C73) Current Plans Pt Education - Pamphlet Given - The Thyroid Book: discussed with patient and provided information. Patient presents on referral from his endocrinologist with newly diagnosed papillary thyroid carcinoma. Patient is accompanied by his wife. They are provided with written literature on thyroid surgery to review at home.  Patient has bilateral multiple thyroid nodules. Fine needle aspiration confirms papillary carcinoma in the nodule in the isthmus. Patient's thyroid function is normal with a TSH level of 2.79.  I have recommended proceeding with total thyroidectomy with limited lymph node dissection. We discussed the risk and benefits of the procedure. We discussed the potential for recurrent laryngeal nerve injury and injury to parathyroid glands. We discussed the location of the surgical incision. We discussed the hospital stay to be anticipated. We discussed the potential need for radioactive iodine treatment postoperatively. We discussed the need for lifelong thyroid hormone replacement. Patient and his wife understand and wish to proceed with surgery in the near future.  The risks and benefits of the procedure have been discussed at length with the patient. The patient understands the proposed procedure, potential alternative treatments, and the course of recovery to be expected. All of the patient's questions have been answered at this time. The patient wishes to proceed with surgery.  Earnstine Regal, MD, [facility] Office: [phone]

## 2016-08-05 ENCOUNTER — Observation Stay (HOSPITAL_COMMUNITY)
Admission: RE | Admit: 2016-08-05 | Discharge: 2016-08-06 | Disposition: A | Discharge status: Home / Self Care | Payer: Medicare Other | Source: Ambulatory Visit | Attending: Surgery | Admitting: Surgery

## 2016-08-05 ENCOUNTER — Encounter (HOSPITAL_COMMUNITY): Payer: Self-pay | Admitting: *Deleted

## 2016-08-05 ENCOUNTER — Encounter (HOSPITAL_COMMUNITY)
Admission: RE | Disposition: A | Discharge status: Home / Self Care | Payer: Self-pay | Source: Ambulatory Visit | Attending: Surgery

## 2016-08-05 ENCOUNTER — Ambulatory Visit (HOSPITAL_COMMUNITY): Payer: Medicare Other | Admitting: Registered Nurse

## 2016-08-05 DIAGNOSIS — Z833 Family history of diabetes mellitus: Secondary | ICD-10-CM | POA: Insufficient documentation

## 2016-08-05 DIAGNOSIS — E1140 Type 2 diabetes mellitus with diabetic neuropathy, unspecified: Secondary | ICD-10-CM | POA: Diagnosis not present

## 2016-08-05 DIAGNOSIS — Z79899 Other long term (current) drug therapy: Secondary | ICD-10-CM | POA: Insufficient documentation

## 2016-08-05 DIAGNOSIS — Z87891 Personal history of nicotine dependence: Secondary | ICD-10-CM | POA: Diagnosis not present

## 2016-08-05 DIAGNOSIS — Z7984 Long term (current) use of oral hypoglycemic drugs: Secondary | ICD-10-CM | POA: Diagnosis not present

## 2016-08-05 DIAGNOSIS — E119 Type 2 diabetes mellitus without complications: Secondary | ICD-10-CM | POA: Diagnosis not present

## 2016-08-05 DIAGNOSIS — I110 Hypertensive heart disease with heart failure: Secondary | ICD-10-CM | POA: Diagnosis not present

## 2016-08-05 DIAGNOSIS — N400 Enlarged prostate without lower urinary tract symptoms: Secondary | ICD-10-CM | POA: Insufficient documentation

## 2016-08-05 DIAGNOSIS — E7800 Pure hypercholesterolemia, unspecified: Secondary | ICD-10-CM | POA: Insufficient documentation

## 2016-08-05 DIAGNOSIS — I10 Essential (primary) hypertension: Secondary | ICD-10-CM | POA: Diagnosis not present

## 2016-08-05 DIAGNOSIS — C73 Malignant neoplasm of thyroid gland: Secondary | ICD-10-CM | POA: Diagnosis not present

## 2016-08-05 DIAGNOSIS — E042 Nontoxic multinodular goiter: Secondary | ICD-10-CM | POA: Diagnosis not present

## 2016-08-05 DIAGNOSIS — I509 Heart failure, unspecified: Secondary | ICD-10-CM | POA: Diagnosis not present

## 2016-08-05 DIAGNOSIS — K219 Gastro-esophageal reflux disease without esophagitis: Secondary | ICD-10-CM | POA: Diagnosis not present

## 2016-08-05 HISTORY — PX: LYMPH NODE DISSECTION: SHX5087

## 2016-08-05 HISTORY — PX: THYROIDECTOMY: SHX17

## 2016-08-05 LAB — GLUCOSE, CAPILLARY
GLUCOSE-CAPILLARY: 108 mg/dL — AB (ref 65–99)
GLUCOSE-CAPILLARY: 141 mg/dL — AB (ref 65–99)
Glucose-Capillary: 162 mg/dL — ABNORMAL HIGH (ref 65–99)
Glucose-Capillary: 143 mg/dL — ABNORMAL HIGH (ref 65–99)
Glucose-Capillary: 164 mg/dL — ABNORMAL HIGH (ref 65–99)

## 2016-08-05 SURGERY — THYROIDECTOMY
Anesthesia: General

## 2016-08-05 MED ORDER — ROCURONIUM BROMIDE 50 MG/5ML IV SOSY
PREFILLED_SYRINGE | INTRAVENOUS | Status: AC
  Filled 2016-08-05: qty 5

## 2016-08-05 MED ORDER — INSULIN ASPART 100 UNIT/ML ~~LOC~~ SOLN
0.0000 [IU] | Freq: Three times a day (TID) | SUBCUTANEOUS | Status: DC
  Administered 2016-08-05: 2 [IU] via SUBCUTANEOUS
  Administered 2016-08-05: 3 [IU] via SUBCUTANEOUS

## 2016-08-05 MED ORDER — TERAZOSIN HCL 5 MG PO CAPS
10.0000 mg | ORAL_CAPSULE | Freq: Every day | ORAL | Status: DC
  Administered 2016-08-06: 10 mg via ORAL
  Filled 2016-08-05: qty 2

## 2016-08-05 MED ORDER — CEFAZOLIN SODIUM-DEXTROSE 2-4 GM/100ML-% IV SOLN
2.0000 g | INTRAVENOUS | Status: AC
  Administered 2016-08-05: 2 g via INTRAVENOUS

## 2016-08-05 MED ORDER — PROPOFOL 10 MG/ML IV BOLUS
INTRAVENOUS | Status: AC
  Filled 2016-08-05: qty 20

## 2016-08-05 MED ORDER — LIDOCAINE 2% (20 MG/ML) 5 ML SYRINGE
INTRAMUSCULAR | Status: AC
  Filled 2016-08-05: qty 5

## 2016-08-05 MED ORDER — SUGAMMADEX SODIUM 200 MG/2ML IV SOLN
INTRAVENOUS | Status: AC
  Filled 2016-08-05: qty 2

## 2016-08-05 MED ORDER — FENTANYL CITRATE (PF) 100 MCG/2ML IJ SOLN
INTRAMUSCULAR | Status: DC | PRN
  Administered 2016-08-05: 50 ug via INTRAVENOUS
  Administered 2016-08-05: 25 ug via INTRAVENOUS
  Administered 2016-08-05: 50 ug via INTRAVENOUS
  Administered 2016-08-05: 25 ug via INTRAVENOUS
  Administered 2016-08-05: 50 ug via INTRAVENOUS

## 2016-08-05 MED ORDER — RAMIPRIL 2.5 MG PO CAPS
2.5000 mg | ORAL_CAPSULE | Freq: Every day | ORAL | Status: DC
  Administered 2016-08-05 – 2016-08-06 (×2): 2.5 mg via ORAL
  Filled 2016-08-05 (×2): qty 1

## 2016-08-05 MED ORDER — PANTOPRAZOLE SODIUM 40 MG PO TBEC
40.0000 mg | DELAYED_RELEASE_TABLET | Freq: Every day | ORAL | Status: DC
  Administered 2016-08-05 – 2016-08-06 (×2): 40 mg via ORAL
  Filled 2016-08-05 (×2): qty 1

## 2016-08-05 MED ORDER — LACTATED RINGERS IV SOLN
INTRAVENOUS | Status: DC | PRN
  Administered 2016-08-05: 07:00:00 via INTRAVENOUS

## 2016-08-05 MED ORDER — DEXAMETHASONE SODIUM PHOSPHATE 10 MG/ML IJ SOLN
INTRAMUSCULAR | Status: DC | PRN
  Administered 2016-08-05: 8 mg via INTRAVENOUS

## 2016-08-05 MED ORDER — CEFAZOLIN SODIUM-DEXTROSE 2-4 GM/100ML-% IV SOLN
INTRAVENOUS | Status: AC
  Filled 2016-08-05: qty 100

## 2016-08-05 MED ORDER — ACETAMINOPHEN 160 MG/5ML PO SOLN
ORAL | Status: AC
  Filled 2016-08-05: qty 40.6

## 2016-08-05 MED ORDER — ACETAMINOPHEN 160 MG/5ML PO SOLN
1000.0000 mg | Freq: Once | ORAL | Status: AC
Start: 2016-08-05 — End: 2016-08-05
  Administered 2016-08-05: 1000 mg via ORAL

## 2016-08-05 MED ORDER — EPHEDRINE SULFATE-NACL 50-0.9 MG/10ML-% IV SOSY
PREFILLED_SYRINGE | INTRAVENOUS | Status: DC | PRN
  Administered 2016-08-05: 5 mg via INTRAVENOUS
  Administered 2016-08-05: 10 mg via INTRAVENOUS

## 2016-08-05 MED ORDER — HYDROMORPHONE HCL 1 MG/ML IJ SOLN
1.0000 mg | INTRAMUSCULAR | Status: DC | PRN
  Administered 2016-08-05 (×2): 1 mg via INTRAVENOUS
  Filled 2016-08-05 (×2): qty 1

## 2016-08-05 MED ORDER — FENTANYL CITRATE (PF) 100 MCG/2ML IJ SOLN
INTRAMUSCULAR | Status: AC
  Filled 2016-08-05: qty 2

## 2016-08-05 MED ORDER — CHLORHEXIDINE GLUCONATE CLOTH 2 % EX PADS: 6.0000 | MEDICATED_PAD | Freq: Once | CUTANEOUS | Status: DC

## 2016-08-05 MED ORDER — FINASTERIDE 5 MG PO TABS
5.0000 mg | ORAL_TABLET | Freq: Every day | ORAL | Status: DC
  Administered 2016-08-06: 5 mg via ORAL
  Filled 2016-08-05: qty 1

## 2016-08-05 MED ORDER — PROMETHAZINE HCL 25 MG/ML IJ SOLN: 6.2500 mg | INTRAMUSCULAR | Status: DC | PRN

## 2016-08-05 MED ORDER — ONDANSETRON 4 MG PO TBDP: 4.0000 mg | ORAL_TABLET | Freq: Four times a day (QID) | ORAL | Status: DC | PRN

## 2016-08-05 MED ORDER — ONDANSETRON HCL 4 MG/2ML IJ SOLN
INTRAMUSCULAR | Status: DC | PRN
  Administered 2016-08-05: 4 mg via INTRAVENOUS

## 2016-08-05 MED ORDER — ROCURONIUM BROMIDE 10 MG/ML (PF) SYRINGE
PREFILLED_SYRINGE | INTRAVENOUS | Status: DC | PRN
  Administered 2016-08-05: 20 mg via INTRAVENOUS
  Administered 2016-08-05: 30 mg via INTRAVENOUS
  Administered 2016-08-05: 10 mg via INTRAVENOUS

## 2016-08-05 MED ORDER — LIDOCAINE 2% (20 MG/ML) 5 ML SYRINGE
INTRAMUSCULAR | Status: DC | PRN
  Administered 2016-08-05: 100 mg via INTRAVENOUS

## 2016-08-05 MED ORDER — KCL IN DEXTROSE-NACL 20-5-0.45 MEQ/L-%-% IV SOLN
INTRAVENOUS | Status: DC
  Administered 2016-08-05: 13:00:00 via INTRAVENOUS
  Filled 2016-08-05 (×2): qty 1000

## 2016-08-05 MED ORDER — CALCIUM CARBONATE 1250 (500 CA) MG PO TABS
2.0000 | ORAL_TABLET | Freq: Three times a day (TID) | ORAL | Status: DC
  Administered 2016-08-05 – 2016-08-06 (×3): 1000 mg via ORAL
  Filled 2016-08-05 (×4): qty 1

## 2016-08-05 MED ORDER — MIDAZOLAM HCL 5 MG/5ML IJ SOLN
INTRAMUSCULAR | Status: DC | PRN
  Administered 2016-08-05: 1 mg via INTRAVENOUS

## 2016-08-05 MED ORDER — MIDAZOLAM HCL 2 MG/2ML IJ SOLN
INTRAMUSCULAR | Status: AC
  Filled 2016-08-05: qty 2

## 2016-08-05 MED ORDER — SODIUM CHLORIDE 0.9 % IR SOLN
Status: DC | PRN
  Administered 2016-08-05: 1000 mL

## 2016-08-05 MED ORDER — PROPOFOL 10 MG/ML IV BOLUS
INTRAVENOUS | Status: DC | PRN
  Administered 2016-08-05: 130 mg via INTRAVENOUS

## 2016-08-05 MED ORDER — SUCCINYLCHOLINE CHLORIDE 200 MG/10ML IV SOSY
PREFILLED_SYRINGE | INTRAVENOUS | Status: DC | PRN
  Administered 2016-08-05: 30 mg via INTRAVENOUS
  Administered 2016-08-05: 100 mg via INTRAVENOUS

## 2016-08-05 MED ORDER — SUGAMMADEX SODIUM 200 MG/2ML IV SOLN
INTRAVENOUS | Status: DC | PRN
  Administered 2016-08-05: 190 mg via INTRAVENOUS

## 2016-08-05 MED ORDER — EPHEDRINE 5 MG/ML INJ
INTRAVENOUS | Status: AC
  Filled 2016-08-05: qty 10

## 2016-08-05 MED ORDER — ACETAMINOPHEN 325 MG PO TABS
650.0000 mg | ORAL_TABLET | Freq: Four times a day (QID) | ORAL | Status: DC | PRN
  Administered 2016-08-06 (×2): 650 mg via ORAL
  Filled 2016-08-05 (×3): qty 2

## 2016-08-05 MED ORDER — ACETAMINOPHEN 650 MG RE SUPP: 650.0000 mg | Freq: Four times a day (QID) | RECTAL | Status: DC | PRN

## 2016-08-05 MED ORDER — ONDANSETRON HCL 4 MG/2ML IJ SOLN
4.0000 mg | Freq: Four times a day (QID) | INTRAMUSCULAR | Status: DC | PRN
  Administered 2016-08-05: 4 mg via INTRAVENOUS
  Filled 2016-08-05: qty 2

## 2016-08-05 MED ORDER — HYDROMORPHONE HCL 1 MG/ML IJ SOLN
0.2500 mg | INTRAMUSCULAR | Status: DC | PRN
  Administered 2016-08-05: 0.25 mg via INTRAVENOUS

## 2016-08-05 MED ORDER — HYDROCODONE-ACETAMINOPHEN 5-325 MG PO TABS: 1.0000 | ORAL_TABLET | ORAL | Status: DC | PRN

## 2016-08-05 MED ORDER — SUCCINYLCHOLINE CHLORIDE 200 MG/10ML IV SOSY
PREFILLED_SYRINGE | INTRAVENOUS | Status: AC
  Filled 2016-08-05: qty 10

## 2016-08-05 MED ORDER — HYDROMORPHONE HCL 1 MG/ML IJ SOLN
INTRAMUSCULAR | Status: AC
  Administered 2016-08-05: 0.25 mg via INTRAVENOUS
  Filled 2016-08-05: qty 0.5

## 2016-08-05 MED ORDER — ONDANSETRON HCL 4 MG/2ML IJ SOLN
INTRAMUSCULAR | Status: AC
  Filled 2016-08-05: qty 2

## 2016-08-05 SURGICAL SUPPLY — 63 items
ADH SKN CLS APL DERMABOND .7 (GAUZE/BANDAGES/DRESSINGS)
ATTRACTOMAT 16X20 MAGNETIC DRP (DRAPES) ×2 IMPLANT
BLADE SURG 10 STRL SS (BLADE) IMPLANT
BLADE SURG 15 STRL LF DISP TIS (BLADE) ×1 IMPLANT
BLADE SURG 15 STRL SS (BLADE) ×2
CANISTER SUCTION 1200CC (MISCELLANEOUS) IMPLANT
CHLORAPREP W/TINT 26ML (MISCELLANEOUS) ×4 IMPLANT
CLIP TI MEDIUM 6 (CLIP) ×5 IMPLANT
CLIP TI WIDE RED SMALL 6 (CLIP) ×6 IMPLANT
COVER MAYO STAND STRL (DRAPES) ×1 IMPLANT
COVER SURGICAL LIGHT HANDLE (MISCELLANEOUS) ×2 IMPLANT
COVER TABLE BACK 60X90 (DRAPES) ×1 IMPLANT
DERMABOND ADVANCED (GAUZE/BANDAGES/DRESSINGS)
DERMABOND ADVANCED .7 DNX12 (GAUZE/BANDAGES/DRESSINGS) IMPLANT
DISSECTOR ROUND CHERRY 3/8 STR (MISCELLANEOUS) IMPLANT
DRAPE LAPAROTOMY T 98X78 PEDS (DRAPES) ×2 IMPLANT
DRAPE UTILITY XL STRL (DRAPES) ×2 IMPLANT
ELECT COATED BLADE 2.86 ST (ELECTRODE) ×1 IMPLANT
ELECT PENCIL ROCKER SW 15FT (MISCELLANEOUS) ×2 IMPLANT
ELECT REM PT RETURN 15FT ADLT (MISCELLANEOUS) ×2 IMPLANT
GAUZE SPONGE 4X4 12PLY STRL (GAUZE/BANDAGES/DRESSINGS) IMPLANT
GAUZE SPONGE 4X4 16PLY XRAY LF (GAUZE/BANDAGES/DRESSINGS) ×2 IMPLANT
GLOVE BIOGEL PI IND STRL 8 (GLOVE) ×1 IMPLANT
GLOVE BIOGEL PI INDICATOR 8 (GLOVE) ×1
GLOVE SURG ORTHO 8.0 STRL STRW (GLOVE) ×2 IMPLANT
GOWN STRL REUS W/ TWL XL LVL3 (GOWN DISPOSABLE) ×1 IMPLANT
GOWN STRL REUS W/TWL 2XL LVL3 (GOWN DISPOSABLE) ×2 IMPLANT
GOWN STRL REUS W/TWL XL LVL3 (GOWN DISPOSABLE) ×6 IMPLANT
HEMOSTAT SURGICEL 2X4 FIBR (HEMOSTASIS) ×2 IMPLANT
ILLUMINATOR WAVEGUIDE N/F (MISCELLANEOUS) ×1 IMPLANT
KIT BASIN OR (CUSTOM PROCEDURE TRAY) ×2 IMPLANT
LIGHT WAVEGUIDE WIDE FLAT (MISCELLANEOUS) IMPLANT
NDL HYPO 25X1 1.5 SAFETY (NEEDLE) ×1 IMPLANT
NEEDLE HYPO 25X1 1.5 SAFETY (NEEDLE) IMPLANT
NS IRRIG 1000ML POUR BTL (IV SOLUTION) ×1 IMPLANT
PACK BASIC VI WITH GOWN DISP (CUSTOM PROCEDURE TRAY) ×2 IMPLANT
PACK BASIN DAY SURGERY FS (CUSTOM PROCEDURE TRAY) ×1 IMPLANT
SHEARS HARMONIC 9CM CVD (BLADE) ×2 IMPLANT
SLEEVE SCD COMPRESS KNEE MED (MISCELLANEOUS) IMPLANT
STAPLER VISISTAT 35W (STAPLE) IMPLANT
STRIP CLOSURE SKIN 1/2X4 (GAUZE/BANDAGES/DRESSINGS) ×2 IMPLANT
SUT MNCRL AB 4-0 PS2 18 (SUTURE) ×2 IMPLANT
SUT MON AB 3-0 SH 27 (SUTURE)
SUT MON AB 3-0 SH27 (SUTURE) IMPLANT
SUT MON AB 5-0 PS2 18 (SUTURE) ×2 IMPLANT
SUT SILK 2 0 (SUTURE) ×2
SUT SILK 2-0 18XBRD TIE 12 (SUTURE) IMPLANT
SUT SILK 3 0 (SUTURE)
SUT SILK 3 0 SH 30 (SUTURE) IMPLANT
SUT SILK 3-0 18XBRD TIE 12 (SUTURE) IMPLANT
SUT VIC AB 3-0 SH 18 (SUTURE) ×4 IMPLANT
SUT VIC AB 3-0 SH 27 (SUTURE) ×2
SUT VIC AB 3-0 SH 27X BRD (SUTURE) ×1 IMPLANT
SUT VIC AB 4-0 BRD 54 (SUTURE) IMPLANT
SYR BULB 3OZ (MISCELLANEOUS) IMPLANT
SYR BULB IRRIGATION 50ML (SYRINGE) ×2 IMPLANT
SYR CONTROL 10ML LL (SYRINGE) ×1 IMPLANT
TOWEL OR 17X24 6PK STRL BLUE (TOWEL DISPOSABLE) ×4 IMPLANT
TOWEL OR 17X26 10 PK STRL BLUE (TOWEL DISPOSABLE) ×2 IMPLANT
TOWEL OR NON WOVEN STRL DISP B (DISPOSABLE) ×2 IMPLANT
TUBE CONNECTING 20X1/4 (TUBING) IMPLANT
YANKAUER SUCT BULB TIP 10FT TU (MISCELLANEOUS) ×2 IMPLANT
YANKAUER SUCT BULB TIP NO VENT (SUCTIONS) IMPLANT

## 2016-08-05 NOTE — Transfer of Care (Signed)
Immediate Anesthesia Transfer of Care Note  Patient: Dominic Kane  Procedure(s) Performed: Procedure(s): TOTAL THYROIDECTOMY (N/A) LIMITED LYMPH NODE DISSECTION (N/A)  Patient Location: PACU  Anesthesia Type:General  Level of Consciousness: awake, alert , oriented and patient cooperative  Airway & Oxygen Therapy: Patient Spontanous Breathing and Patient connected to face mask oxygen  Post-op Assessment: Report given to RN, Post -op Vital signs reviewed and stable and Patient moving all extremities  Post vital signs: Reviewed and stable  Last Vitals:  Vitals:   08/05/16 0545  BP: (!) 160/92  Pulse: (!) 52  Resp: 16  Temp: 36.4 C    Last Pain:  Vitals:   08/05/16 0628  TempSrc:   PainSc: 0-No pain      Patients Stated Pain Goal: 3 (96/29/52 8413)  Complications: No apparent anesthesia complications

## 2016-08-05 NOTE — Interval H&P Note (Signed)
History and Physical Interval Note:  08/05/2016 7:33 AM  Dominic Kane  has presented today for surgery, with the diagnosis of papillary thyroid cancer.  The various methods of treatment have been discussed with the patient and family. After consideration of risks, benefits and other options for treatment, the patient has consented to    Procedure(s): TOTAL THYROIDECTOMY (N/A) LIMITED LYMPH NODE DISSECTION (N/A) as a surgical intervention .    The patient's history has been reviewed, patient examined, no change in status, stable for surgery.  I have reviewed the patient's chart and labs.  Questions were answered to the patient's satisfaction.    Earnstine Regal, MD, [facility] Office: [place]

## 2016-08-05 NOTE — Anesthesia Preprocedure Evaluation (Addendum)
Anesthesia Evaluation  Patient identified by MRN, date of birth, ID band Patient awake    Reviewed: Allergy & Precautions, H&P , NPO status , Patient's Chart, lab work & pertinent test results  History of Anesthesia Complications Negative for: history of anesthetic complications  Airway Mallampati: II   Neck ROM: full    Dental  (+) Partial Lower, Missing, Dental Advisory Given   Pulmonary former smoker,    Pulmonary exam normal        Cardiovascular hypertension, + Peripheral Vascular Disease and +CHF  Normal cardiovascular exam  EF 30%.  H/o thoracic aneurysm   Neuro/Psych  Headaches, negative psych ROS   GI/Hepatic GERD  ,  Endo/Other  diabetes, Type 2  Renal/GU      Musculoskeletal  (+) Arthritis ,   Abdominal   Peds  Hematology   Anesthesia Other Findings   Reproductive/Obstetrics                            Anesthesia Physical  Anesthesia Plan  ASA: III  Anesthesia Plan:    Post-op Pain Management:  Regional for Post-op pain   Induction: Intravenous  Airway Management Planned: Oral ETT  Additional Equipment:   Intra-op Plan:   Post-operative Plan: Extubation in OR  Informed Consent: I have reviewed the patients History and Physical, chart, labs and discussed the procedure including the risks, benefits and alternatives for the proposed anesthesia with the patient or authorized representative who has indicated his/her understanding and acceptance.   Dental advisory given  Plan Discussed with: Anesthesiologist and CRNA  Anesthesia Plan Comments:        Anesthesia Quick Evaluation

## 2016-08-05 NOTE — Anesthesia Procedure Notes (Signed)
Procedure Name: Intubation Date/Time: 08/05/2016 7:45 AM Performed by: Carleene Cooper A Pre-anesthesia Checklist: Patient identified, Emergency Drugs available, Suction available, Patient being monitored and Timeout performed Patient Re-evaluated:Patient Re-evaluated prior to inductionOxygen Delivery Method: Circle system utilized Preoxygenation: Pre-oxygenation with 100% oxygen Intubation Type: IV induction Ventilation: Mask ventilation without difficulty Laryngoscope Size: Mac and 4 Grade View: Grade I Tube type: Oral Tube size: 7.5 mm Number of attempts: 1 Airway Equipment and Method: Stylet Placement Confirmation: ETT inserted through vocal cords under direct vision,  positive ETCO2 and breath sounds checked- equal and bilateral Secured at: 22 cm Tube secured with: Tape Dental Injury: Teeth and Oropharynx as per pre-operative assessment  Comments: Intubation with ease by Quillian Quince, EMT student

## 2016-08-05 NOTE — Anesthesia Postprocedure Evaluation (Addendum)
Anesthesia Post Note  Patient: Dominic Kane  Procedure(s) Performed: Procedure(s) (LRB): TOTAL THYROIDECTOMY (N/A) LIMITED LYMPH NODE DISSECTION (N/A)  Patient location during evaluation: PACU Anesthesia Type: General Level of consciousness: sedated Pain management: pain level controlled Vital Signs Assessment: post-procedure vital signs reviewed and stable Respiratory status: spontaneous breathing and respiratory function stable Cardiovascular status: stable Anesthetic complications: no       Last Vitals:  Vitals:   08/05/16 1015 08/05/16 1040  BP: 135/75 (!) 147/76  Pulse: (!) 58 (!) 55  Resp: 12 12  Temp: 36.8 C (!) 36 C    Last Pain:  Vitals:   08/05/16 1015  TempSrc:   PainSc: 4                  Dominic Kane DANIEL

## 2016-08-05 NOTE — Op Note (Signed)
Procedure Note  Pre-operative Diagnosis:  Papillary thyroid carcinoma  Post-operative Diagnosis:  same  Surgeon:  Earnstine Regal, MD, FACS  Assistant:  none   Procedure:  Total thyroidectomy with limited lymph node dissection  Anesthesia:  General  Estimated Blood Loss:  minimal  Drains: none         Specimen: thyroid to pathology  Indications:  The patient is a 76 year old male who presents with thyroid cancer.  Patient is referred by Dr. 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 Patient had undergone a CT scan of the chest as part of a cardiac evaluation. Incidental finding was made of thyroid nodules. Patient subsequently underwent thyroid ultrasound which documented multiple bilateral thyroid nodules. Fine needle aspiration biopsy was recommended. Patient underwent aspiration of the dominant nodule in the thyroid isthmus. This was positive for papillary thyroid carcinoma.   Procedure Details: Procedure was done in OR #4 at the [facility].  The patient was brought to the operating room and placed in a supine position on the operating room table.  Following administration of general anesthesia, the patient was positioned and then prepped and draped in the usual aseptic fashion.  After ascertaining that an adequate level of anesthesia had been achieved, a Kocher incision was made with #15 blade.  Dissection was carried through subcutaneous tissues and platysma. Hemostasis was achieved with the electrocautery.  Skin flaps were elevated cephalad and caudad from the thyroid notch to the sternal notch.  The Mahorner self-retaining retractor was placed for exposure.  Strap muscles were incised in the midline and dissection was begun on the left side.  Strap muscles were reflected laterally.  Left thyroid lobe was nodular with somewhat adherent overlying musculature.  The left lobe was gently mobilized with blunt dissection.  Superior pole vessels  were dissected out and divided individually between small and medium Ligaclips with the Harmonic scalpel.  The thyroid lobe was rolled anteriorly.  Branches of the inferior thyroid artery were divided between small Ligaclips with the Harmonic scalpel.  Inferior venous tributaries were divided between Ligaclips.  Both the superior and inferior parathyroid glands were identified and preserved on their vascular pedicles.  The recurrent laryngeal nerve was identified and preserved along its course.  The ligament of Gwenlyn Found was released with the electrocautery and the gland was mobilized onto the anterior trachea. Isthmus was mobilized across the midline.  There was a small pyramidal lobe present which was resected en bloc with the isthmus.  Dry pack was placed in the left neck.  Next, the right thyroid lobe was gently mobilized with blunt dissection.  Right thyroid lobe was nodular with a firm mass in the inferior pole adherent to the overlying musculature, and a firm nodule near the ligament of Berry adherent to the trachea.  Superior pole vessels were dissected out and divided between small and medium Ligaclips with the Harmonic scalpel.  Superior parathyroid was identified and preserved.  Inferior venous tributaries were divided between medium Ligaclips with the Harmonic scalpel.  The right thyroid lobe was rolled anteriorly and the branches of the inferior thyroid artery divided between small Ligaclips.  The right recurrent laryngeal nerve was identified and preserved along its course.  The ligament of Gwenlyn Found was released with the electrocautery.  The right thyroid lobe was mobilized onto the anterior trachea and the remainder of the thyroid was dissected off the anterior trachea and the thyroid was completely excised.  A suture was used to mark the  right lobe. The entire thyroid gland was submitted to pathology for review.  Central compartment lymph nodes were sampled by resecting tissue between the carotid  arteries and overlying the trachea taking care to avoid parathyroid tissue.  Hemostasis was obtained with the electrocautery and medium ligaclips.  The tissue was submitted to pathology separately labelled as central compartment lymph nodes.  The neck was irrigated with warm saline.  Fibrillar was placed throughout the operative field.  Strap muscles were reapproximated in the midline with interrupted 3-0 Vicryl sutures.  Platysma was closed with interrupted 3-0 Vicryl sutures.  Skin was closed with a running 4-0 Monocryl subcuticular suture.  Wound was washed and dried and steri-strips were applied.  Dry gauze dressing was placed.  The patient was awakened from anesthesia and brought to the recovery room.  The patient tolerated the procedure well.   Earnstine Regal, MD, [facility] Office: [phone]

## 2016-08-06 DIAGNOSIS — E119 Type 2 diabetes mellitus without complications: Secondary | ICD-10-CM | POA: Diagnosis not present

## 2016-08-06 DIAGNOSIS — I509 Heart failure, unspecified: Secondary | ICD-10-CM | POA: Diagnosis not present

## 2016-08-06 DIAGNOSIS — I110 Hypertensive heart disease with heart failure: Secondary | ICD-10-CM | POA: Diagnosis not present

## 2016-08-06 DIAGNOSIS — E042 Nontoxic multinodular goiter: Secondary | ICD-10-CM | POA: Diagnosis not present

## 2016-08-06 DIAGNOSIS — C73 Malignant neoplasm of thyroid gland: Secondary | ICD-10-CM | POA: Diagnosis not present

## 2016-08-06 DIAGNOSIS — E7800 Pure hypercholesterolemia, unspecified: Secondary | ICD-10-CM | POA: Diagnosis not present

## 2016-08-06 LAB — GLUCOSE, CAPILLARY: Glucose-Capillary: 114 mg/dL — ABNORMAL HIGH (ref 65–99)

## 2016-08-06 LAB — BASIC METABOLIC PANEL
ANION GAP: 7 (ref 5–15)
BUN: 22 mg/dL — ABNORMAL HIGH (ref 6–20)
CALCIUM: 9.3 mg/dL (ref 8.9–10.3)
CHLORIDE: 103 mmol/L (ref 101–111)
CO2: 26 mmol/L (ref 22–32)
CREATININE: 0.88 mg/dL (ref 0.61–1.24)
GFR calc non Af Amer: >60 mL/min (ref >60)
Glucose, Bld: 147 mg/dL — ABNORMAL HIGH (ref 65–99)
POTASSIUM: 4.6 mmol/L (ref 3.5–5.1)
SODIUM: 136 mmol/L (ref 135–145)

## 2016-08-06 MED ORDER — OXYCODONE HCL 5 MG PO TABS: 5.0000 mg | ORAL_TABLET | ORAL | 0 refills | Status: DC | PRN

## 2016-08-06 MED ORDER — LEVOTHYROXINE SODIUM 100 MCG PO TABS: 100.0000 ug | ORAL_TABLET | Freq: Every day | ORAL | 0 refills | Status: DC

## 2016-08-06 MED ORDER — LEVOTHYROXINE SODIUM 100 MCG PO TABS: 100.0000 ug | ORAL_TABLET | Freq: Every day | ORAL | 3 refills | Status: DC

## 2016-08-06 NOTE — Discharge Summary (Signed)
Physician Discharge Summary [facility]  Patient ID: Dominic Kane MRN: [id number] DOB/AGE: 06-10-1940 76 y.o.  Admit date: 08/05/2016 Discharge date: 08/06/2016  Admission Diagnoses:  Papillary thyroid carcinoma  Discharge Diagnoses:  Principal Problem:   Papillary thyroid carcinoma [facility])   Discharged Condition: good  Hospital Course: Patient was admitted for observation following thyroid surgery.  Post op course was uncomplicated.  Pain was well controlled.  Tolerated diet.  Post op calcium level on morning following surgery was 9.3 mg/dl.  Patient was prepared for discharge home on POD#1.  Consults: None  Treatments: surgery: total thyroidectomy with limited lymph node dissection  Discharge Exam: Blood pressure 115/64, pulse (!) 53, temperature 97.7 F (36.5 C), temperature source Oral, resp. rate 16, height 6\' 2"  (1.88 m), weight 93.9 kg (207 lb), SpO2 98 %. HEENT - clear Neck - wound dry and intact; min STS; voice normal Chest - clear bilaterally Cor - RRR  Disposition: Home  Discharge Instructions    Apply dressing    Complete by:  As directed    Apply light gauze dressing to wound before discharge home today.   Diet - low sodium heart healthy    Complete by:  As directed    Discharge instructions    Complete by:  As directed    [facility]  THYROID & PARATHYROID SURGERY:  POST-OP INSTRUCTIONS  Always review your discharge instruction sheet from the facility where your surgery was performed.  A prescription for pain medication may be given to you upon discharge.  Take your pain medication as prescribed.  If narcotic pain medicine is not needed, then you may take acetaminophen (Tylenol) or ibuprofen (Advil) as needed.  Take your usually prescribed medications unless otherwise directed.  If you need a refill on your pain medication, please contact our office during regular business hours.  Prescriptions will not be  processed by our office after 5 pm or on weekends.  Start with a light diet upon arrival home, such as soup and crackers or toast.  Be sure to drink plenty of fluids daily.  Resume your normal diet the day after surgery.  Most patients will experience some swelling and bruising on the chest and neck area.  Ice packs will help.  Swelling and bruising can take several days to resolve.   It is common to experience some constipation after surgery.  Increasing fluid intake and taking a stool softener (Colace) will usually help or prevent this problem.  A mild laxative (Milk of Magnesia or Miralax) should be taken according to package directions if there has been no bowel movement after 48 hours.  You have steri-strips and a gauze dressing over your incision.  You may remove the gauze bandage on the second day after surgery, and you may shower at that time.  Leave your steri-strips (small skin tapes) in place directly over the incision.  These strips should remain on the skin for 5-7 days and then be removed.  You may get them wet in the shower and pat them dry.  You may resume regular (light) daily activities beginning the next day - such as daily self-care, walking, climbing stairs - gradually increasing activities as tolerated.  You may have sexual intercourse when it is comfortable.  Refrain from any heavy lifting or straining until approved by your doctor.  You may drive when you no longer are taking prescription pain medication, you can comfortably wear a seatbelt, and you can safely maneuver your  car and apply brakes.  You should see your doctor in the office for a follow-up appointment approximately three weeks after your surgery.  Make sure that you call for this appointment within a day or two after you arrive home to insure a convenient appointment time.  WHEN TO CALL YOUR DOCTOR: -- Fever greater than 101.5 -- Inability to urinate -- Nausea and/or vomiting - persistent -- Extreme swelling or  bruising -- Continued bleeding from incision -- Increased pain, redness, or drainage from the incision -- Difficulty swallowing or breathing -- Muscle cramping or spasms -- Numbness or tingling in hands or around lips  The clinic staff is available to answer your questions during regular business hours.  Please don't hesitate to call and ask to speak to one of the nurses if you have concerns.  Earnstine Regal, MD, [facility] Office: [phone]  Website: [email or web address]   Increase activity slowly    Complete by:  As directed    Remove dressing in 24 hours    Complete by:  As directed      Allergies as of 08/06/2016      Reactions   Codeine Nausea Only   Sulfonamide Derivatives Hives      Medication List    TAKE these medications   aspirin EC 81 MG tablet Take 81 mg by mouth daily.   aspirin-acetaminophen-caffeine 250-250-65 MG tablet Commonly known as:  EXCEDRIN MIGRAINE Take 2 tablets by mouth every 6 (six) hours as needed for headache or migraine.   BREATHE RIGHT LARGE Strp Place 1 strip onto the skin daily as needed (for nasal congestion.).   cetirizine 10 MG tablet Commonly known as:  ZYRTEC Take 10 mg by mouth daily.   finasteride 5 MG tablet Commonly known as:  PROSCAR Take 5 mg by mouth daily with breakfast.   furosemide 40 MG tablet Commonly known as:  LASIX TAKE 1 TABLET DAILY   glucose blood test strip Commonly known as:  ONE TOUCH ULTRA TEST Use to check blood sugar two times a day. Dx: E11.9   levothyroxine 100 MCG tablet Commonly known as:  SYNTHROID Take 1 tablet (100 mcg total) by mouth daily.   Melatonin 5 MG Tabs Take 5 mg by mouth at bedtime.   multivitamin with minerals Tabs tablet Take 1 tablet by mouth at bedtime.   omeprazole 40 MG capsule Commonly known as:  PRILOSEC Take 40 mg by mouth daily with breakfast.   ONETOUCH DELICA LANCETS 41U Misc Use to check  blood sugar two times a day. Dx: E11.9   oxyCODONE 5 MG immediate release tablet Commonly known as:  Oxy IR/ROXICODONE Take 1-2 tablets (5-10 mg total) by mouth every 4 (four) hours as needed for moderate pain.   PRESCRIPTION MEDICATION every 4 (four) months. *Antibiotic Injection in Eye*   PRESERVISION AREDS 2 Caps Take 1 capsule by mouth 2 (two) times daily.   ramipril 2.5 MG capsule Commonly known as:  ALTACE TAKE 1 CAPSULE DAILY   rosuvastatin 10 MG tablet Commonly known as:  CRESTOR TAKE 1 TABLET DAILY   terazosin 10 MG capsule Commonly known as:  HYTRIN Take 10 mg by mouth daily with breakfast.        Earnstine Regal, MD, [facility] Office: [phone]   Signed: Earnstine Regal 08/06/2016, 7:53 AM

## 2016-08-12 ENCOUNTER — Telehealth: Payer: Self-pay

## 2016-08-12 NOTE — Telephone Encounter (Signed)
Patient has been requested the schedule an appointment within the week and at this time there is not available appointment's when should the the patient be added on?

## 2016-08-12 NOTE — Telephone Encounter (Signed)
245 tomorrow

## 2016-08-13 ENCOUNTER — Encounter: Payer: Self-pay | Admitting: Endocrinology

## 2016-08-13 ENCOUNTER — Ambulatory Visit (INDEPENDENT_AMBULATORY_CARE_PROVIDER_SITE_OTHER): Payer: Medicare Other | Admitting: Endocrinology

## 2016-08-13 VITALS — BP 112/64 | HR 60 | Ht 72.0 in | Wt 207.0 lb

## 2016-08-13 DIAGNOSIS — C73 Malignant neoplasm of thyroid gland: Secondary | ICD-10-CM

## 2016-08-13 DIAGNOSIS — I2510 Atherosclerotic heart disease of native coronary artery without angina pectoris: Secondary | ICD-10-CM | POA: Diagnosis not present

## 2016-08-13 NOTE — Patient Instructions (Signed)
Please continue the same medication. Please do the radioactive iodine pill.  you will receive a phone call, about a day and time for an appointment.  Please come back for a follow-up appointment in 6 weeks.

## 2016-08-13 NOTE — Progress Notes (Signed)
Subjective:    Patient ID: Dominic Kane, male    DOB: 1940/09/23, 76 y.o.   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  She had surg last week.  pT3b, pN0. He is on synthroid.  He had fatigue being off the synthroid, for even a few days.    Past Medical History:  Diagnosis Date  . Abnormality of gait   . Acute sinusitis, unspecified   . Allergy   . 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 Aortic valve disorders   . Arthritis    Osteoarthritis-bilateral knees, lower back issues occasionaly related to knee issues  . Benign paroxysmal positional vertigo    not in a long time  . Cataract    resolved  . CHF (congestive heart failure) ([place])   . Coronary atherosclerosis of native coronary artery   . Diabetes mellitus without complication ([place])    Diet control only.  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 Diverticulosis of colon (without mention of hemorrhage)   . Dysfunction of eustachian tube    Hard of hearing"bilateral hearing aids"  . Enlarged prostate   . GERD (gastroesophageal reflux disease)   . Headache(784.0)   . History of kidney stones    past hx. 15 yrs ago x1  . Hypertrophy of prostate without urinary obstruction and other lower urinary tract symptoms (LUTS)   . Lipoprotein deficiencies   . Other and unspecified hyperlipidemia   . Other primary cardiomyopathies   . Pain in joint, lower leg   . Personal history of colonic polyps   . Personal history of other diseases of digestive system   . Pes anserinus tendinitis or bursitis    left shoulder remains an issue  . Sinoatrial node dysfunction (HCC)    [name] follows  . 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 Thoracic or lumbosacral neuritis or radiculitis, unspecified   . Unspecified disorder of skin and subcutaneous tissue   . Unspecified essential hypertension   . Unspecified vitamin D deficiency      Past Surgical History:  Procedure Laterality Date  . CATARACT EXTRACTION, BILATERAL    . COLONOSCOPY W/ POLYPECTOMY    . DOBUTAMINE STRESS ECHO  1/07   Lateral hypokinesis but no ischemia  . ESOPHAGOGASTRODUODENOSCOPY  11/07   Schatzki's ring, non bleeding erosive gastropathy [place]  . KNEE SURGERY Left 1947  . LYMPH NODE DISSECTION N/A 08/05/2016   Procedure: LIMITED LYMPH NODE DISSECTION;  Surgeon: Armandina Gemma, MD;  Location: [facility];  Service: General;  Laterality: N/A;  . Pulmonary functioning tests  1. 2003  2. 2005   1. Diminished lung capacity  2. Stable  . THYROIDECTOMY N/A 08/05/2016   Procedure: TOTAL THYROIDECTOMY;  Surgeon: Armandina Gemma, MD;  Location: [facility];  Service: General;  Laterality: N/A;  . TONSILLECTOMY    . TOTAL KNEE ARTHROPLASTY Left 03/18/2016   Procedure: LEFT TOTAL KNEE ARTHROPLASTY;  Surgeon: Paralee Cancel, MD;  Location: [facility];  Service: Orthopedics;  Laterality: Left;    Social History   Social History  . Marital status: Married    Spouse name: N/A  . Number of children: 1  . Years of education: N/A   Occupational History  . retired Teacher, English as a foreign language Retired   Social History Main Topics  . Smoking status: Former Smoker    Quit date: 07/03/1980  . Smokeless tobacco: Never Used  .  Alcohol use 1.2 - 1.8 oz/week    1 - 2 Glasses of wine, 1 Cans of beer per week  . Drug use: No  . Sexual activity: Not on file   Other Topics Concern  . Not on file   Social History Narrative  . No narrative on file    Current Outpatient Prescriptions on File Prior to Visit  Medication Sig Dispense Refill  . aspirin EC 81 MG tablet Take 81 mg by mouth daily.    [name] aspirin-acetaminophen-caffeine (EXCEDRIN MIGRAINE) 250-250-65 MG tablet Take 2 tablets by mouth every 6 (six) hours as needed for headache or migraine.    . cetirizine (ZYRTEC) 10 MG tablet Take 10 mg by mouth daily.    . finasteride (PROSCAR) 5 MG tablet Take 5 mg by mouth daily with  breakfast.     . furosemide (LASIX) 40 MG tablet TAKE 1 TABLET DAILY 90 tablet 3  . glucose blood (ONE TOUCH ULTRA TEST) test strip Use to check blood sugar two times a day. Dx: E11.9 200 each 3  . levothyroxine (SYNTHROID) 100 MCG tablet Take 1 tablet (100 mcg total) by mouth daily. 30 tablet 0  . Melatonin 5 MG TABS Take 5 mg by mouth at bedtime.     . Multiple Vitamin (MULTIVITAMIN WITH MINERALS) TABS tablet Take 1 tablet by mouth at bedtime.    . Multiple Vitamins-Minerals (PRESERVISION AREDS 2) CAPS Take 1 capsule by mouth 2 (two) times daily.    . Nasal Dilators (BREATHE RIGHT LARGE) STRP Place 1 strip onto the skin daily as needed (for nasal congestion.).    [name] omeprazole (PRILOSEC) 40 MG capsule Take 40 mg by mouth daily with breakfast.     . ONETOUCH DELICA LANCETS 99B MISC Use to check blood sugar two times a day. Dx: E11.9 200 each 3  . PRESCRIPTION MEDICATION every 4 (four) months. *Antibiotic Injection in Eye*    . ramipril (ALTACE) 2.5 MG capsule TAKE 1 CAPSULE DAILY 90 capsule 3  . rosuvastatin (CRESTOR) 10 MG tablet TAKE 1 TABLET DAILY 90 tablet 3  . terazosin (HYTRIN) 10 MG capsule Take 10 mg by mouth daily with breakfast.      No current facility-administered medications on file prior to visit.     Allergies  Allergen Reactions  . Codeine Nausea Only  . Sulfonamide Derivatives Hives    Family History  Problem Relation Age of Onset  . Heart failure Mother   . Coronary artery disease Mother   . Stroke Mother   . Hypothyroidism Mother   . Cancer Father        LUNG  . Cancer Sister        BREAST  . Heart failure Maternal Grandmother   . Hypothyroidism Daughter   . Colon cancer Neg Hx     BP 112/64   Pulse 60   Ht 6' (1.829 m)   Wt 207 lb (93.9 kg)   SpO2 96%   BMI 28.07 kg/m    Review of Systems Denies edema and weight change.      Objective:   Physical Exam VITAL SIGNS:  See vs page GENERAL: no distress Neck: a healing scar is present.  i do not  appreciate a nodule in the thyroid or elsewhere in the neck LUNGS:  Clear to auscultation HEART:  Regular rate and rhythm without murmurs noted. Normal S1,S2.   PSYCH: Alert and well-oriented.  Does not appear anxious nor depressed. MSK: gait is normal and steady.  Ext: no edema   MULTIFOCAL PAPILLARY THYROID CARCINOMA, 1.5, 0.6, 0.4, 0.3 AND 0.3 CM. - TUMOR INVOLVES RIGHT LOBE, LEFT LOBE AND ISTHMUS. - FOCAL EXTRATHYROID EXTENSION. - FOCAL SURGICAL MARGIN INVOLVEMENT.  - TWO BENIGN LYMPH NODES (0/2). T3 N0 M0    Assessment & Plan:  Stage 2 differentiated thyroid cancer Postsurgical hypothyroidism, new.  He did not tolerate being off synthroid, so Ii'll request RAI stimulated by thyrogen, rather than synthroid withdrawal.  Patient Instructions  Please continue the same medication. Please do the radioactive iodine pill.  you will receive a phone call, about a day and time for an appointment.  Please come back for a follow-up appointment in 6 weeks.

## 2016-08-13 NOTE — Telephone Encounter (Signed)
Spoke with the patient and he stated he will be here

## 2016-08-14 ENCOUNTER — Other Ambulatory Visit: Payer: Self-pay | Admitting: Endocrinology

## 2016-08-14 DIAGNOSIS — C73 Malignant neoplasm of thyroid gland: Secondary | ICD-10-CM

## 2016-08-19 ENCOUNTER — Other Ambulatory Visit: Payer: Self-pay | Admitting: Family Medicine

## 2016-08-23 NOTE — Addendum Note (Signed)
Addendum  created 08/23/16 1003 by Duane Boston, MD   Sign clinical note

## 2016-08-25 ENCOUNTER — Encounter (INDEPENDENT_AMBULATORY_CARE_PROVIDER_SITE_OTHER): Payer: Medicare Other | Admitting: Ophthalmology

## 2016-08-25 DIAGNOSIS — H35372 Puckering of macula, left eye: Secondary | ICD-10-CM | POA: Diagnosis not present

## 2016-08-25 DIAGNOSIS — I10 Essential (primary) hypertension: Secondary | ICD-10-CM

## 2016-08-25 DIAGNOSIS — H34811 Central retinal vein occlusion, right eye, with macular edema: Secondary | ICD-10-CM

## 2016-08-25 DIAGNOSIS — E113291 Type 2 diabetes mellitus with mild nonproliferative diabetic retinopathy without macular edema, right eye: Secondary | ICD-10-CM | POA: Diagnosis not present

## 2016-08-25 DIAGNOSIS — H43813 Vitreous degeneration, bilateral: Secondary | ICD-10-CM | POA: Diagnosis not present

## 2016-08-25 DIAGNOSIS — H35033 Hypertensive retinopathy, bilateral: Secondary | ICD-10-CM | POA: Diagnosis not present

## 2016-08-25 DIAGNOSIS — H353111 Nonexudative age-related macular degeneration, right eye, early dry stage: Secondary | ICD-10-CM

## 2016-08-25 DIAGNOSIS — E113212 Type 2 diabetes mellitus with mild nonproliferative diabetic retinopathy with macular edema, left eye: Secondary | ICD-10-CM

## 2016-08-25 DIAGNOSIS — E11311 Type 2 diabetes mellitus with unspecified diabetic retinopathy with macular edema: Secondary | ICD-10-CM | POA: Diagnosis not present

## 2016-08-26 DIAGNOSIS — C73 Malignant neoplasm of thyroid gland: Secondary | ICD-10-CM | POA: Diagnosis not present

## 2016-08-27 ENCOUNTER — Encounter (HOSPITAL_COMMUNITY)
Admission: RE | Admit: 2016-08-27 | Discharge: 2016-08-27 | Disposition: A | Discharge status: Home / Self Care | Payer: Medicare Other | Source: Ambulatory Visit | Attending: Endocrinology | Admitting: Endocrinology

## 2016-08-27 DIAGNOSIS — C73 Malignant neoplasm of thyroid gland: Secondary | ICD-10-CM | POA: Diagnosis not present

## 2016-08-27 MED ORDER — THYROTROPIN ALFA 1.1 MG IM SOLR
0.9000 mg | INTRAMUSCULAR | Status: AC
  Administered 2016-08-27: 0.9 mg via INTRAMUSCULAR

## 2016-08-27 MED ORDER — STERILE WATER FOR INJECTION IJ SOLN
INTRAMUSCULAR | Status: AC
  Filled 2016-08-27: qty 10

## 2016-08-28 ENCOUNTER — Encounter (HOSPITAL_COMMUNITY)
Admission: RE | Admit: 2016-08-28 | Discharge: 2016-08-28 | Disposition: A | Discharge status: Home / Self Care | Payer: Medicare Other | Source: Ambulatory Visit | Attending: Endocrinology | Admitting: Endocrinology

## 2016-08-28 DIAGNOSIS — C73 Malignant neoplasm of thyroid gland: Secondary | ICD-10-CM | POA: Diagnosis not present

## 2016-08-28 MED ORDER — STERILE WATER FOR INJECTION IJ SOLN
INTRAMUSCULAR | Status: AC
  Filled 2016-08-28: qty 10

## 2016-08-28 MED ORDER — THYROTROPIN ALFA 1.1 MG IM SOLR
0.9000 mg | INTRAMUSCULAR | Status: AC
  Administered 2016-08-28: 0.9 mg via INTRAMUSCULAR

## 2016-08-29 ENCOUNTER — Encounter (HOSPITAL_COMMUNITY)
Admission: RE | Admit: 2016-08-29 | Discharge: 2016-08-29 | Disposition: A | Discharge status: Home / Self Care | Payer: Medicare Other | Source: Ambulatory Visit | Attending: Endocrinology | Admitting: Endocrinology

## 2016-08-29 DIAGNOSIS — C73 Malignant neoplasm of thyroid gland: Secondary | ICD-10-CM | POA: Diagnosis not present

## 2016-08-29 MED ORDER — SODIUM IODIDE I 131 CAPSULE
122.0000 | Freq: Once | INTRAVENOUS | Status: AC | PRN
Start: 2016-08-29 — End: 2016-08-29
  Administered 2016-08-29: 122 via ORAL

## 2016-09-08 ENCOUNTER — Encounter (HOSPITAL_COMMUNITY)
Admission: RE | Admit: 2016-09-08 | Discharge: 2016-09-08 | Disposition: A | Discharge status: Home / Self Care | Payer: Medicare Other | Source: Ambulatory Visit | Attending: Endocrinology | Admitting: Endocrinology

## 2016-09-08 DIAGNOSIS — Z85850 Personal history of malignant neoplasm of thyroid: Secondary | ICD-10-CM | POA: Diagnosis not present

## 2016-09-08 DIAGNOSIS — C73 Malignant neoplasm of thyroid gland: Secondary | ICD-10-CM

## 2016-09-15 ENCOUNTER — Other Ambulatory Visit: Payer: Self-pay | Admitting: Family Medicine

## 2016-09-22 ENCOUNTER — Ambulatory Visit (INDEPENDENT_AMBULATORY_CARE_PROVIDER_SITE_OTHER): Payer: Medicare Other | Admitting: Endocrinology

## 2016-09-22 DIAGNOSIS — I2510 Atherosclerotic heart disease of native coronary artery without angina pectoris: Secondary | ICD-10-CM

## 2016-09-22 DIAGNOSIS — E890 Postprocedural hypothyroidism: Secondary | ICD-10-CM

## 2016-09-22 DIAGNOSIS — E039 Hypothyroidism, unspecified: Secondary | ICD-10-CM | POA: Insufficient documentation

## 2016-09-22 NOTE — Patient Instructions (Addendum)
blood tests are requested for you today.  We'll let you know about the results.  Please come back for a follow-up appointment in 4-5 months.    

## 2016-09-22 NOTE — Progress Notes (Signed)
Subjective:    Patient ID: Dominic Kane, male    DOB: Apr 16, 1940, 76 y.o.   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   4/18: thyroidectomy:  pT3b, pN0 6/18: RAI, on synthroid, 122 mCi, stimulated by thyrogen.  He does not notice any neck nodule.  Stable problem Hypocalcemia was noted Dec, 2017: he has intermitt leg cramps.  Stable problem Postsurgical hypothyroidism: he denies numbness.  Stable problem.   Past Medical History:  Diagnosis Date  . Abnormality of gait   . Acute sinusitis, unspecified   . Allergy   . 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 Aortic valve disorders   . Arthritis    Osteoarthritis-bilateral knees, lower back issues occasionaly related to knee issues  . Benign paroxysmal positional vertigo    not in a long time  . Cataract    resolved  . CHF (congestive heart failure) ([place])   . Coronary atherosclerosis of native coronary artery   . Diabetes mellitus without complication ([place])    Diet control only.  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 Diverticulosis of colon (without mention of hemorrhage)   . Dysfunction of eustachian tube    Hard of hearing"bilateral hearing aids"  . Enlarged prostate   . GERD (gastroesophageal reflux disease)   . Headache(784.0)   . History of kidney stones    past hx. 15 yrs ago x1  . Hypertrophy of prostate without urinary obstruction and other lower urinary tract symptoms (LUTS)   . Lipoprotein deficiencies   . Other and unspecified hyperlipidemia   . Other primary cardiomyopathies   . Pain in joint, lower leg   . Personal history of colonic polyps   . Personal history of other diseases of digestive system   . Pes anserinus tendinitis or bursitis    left shoulder remains an issue  . Sinoatrial node  dysfunction (HCC)    [name] follows  . 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 Thoracic or lumbosacral neuritis or radiculitis, unspecified   . Unspecified disorder of skin and subcutaneous tissue   . Unspecified essential hypertension   . Unspecified vitamin D deficiency     Past Surgical History:  Procedure Laterality Date  . CATARACT EXTRACTION, BILATERAL    . COLONOSCOPY W/ POLYPECTOMY    . DOBUTAMINE STRESS ECHO  1/07   Lateral hypokinesis but no ischemia  . ESOPHAGOGASTRODUODENOSCOPY  11/07   Schatzki's ring, non bleeding erosive gastropathy [place]  . KNEE SURGERY Left 1947  . LYMPH NODE DISSECTION N/A 08/05/2016   Procedure: LIMITED LYMPH NODE DISSECTION;  Surgeon: Armandina Gemma, MD;  Location: [facility];  Service: General;  Laterality: N/A;  . Pulmonary functioning tests  1. 2003  2. 2005   1. Diminished lung capacity  2. Stable  . THYROIDECTOMY N/A 08/05/2016   Procedure: TOTAL THYROIDECTOMY;  Surgeon: Armandina Gemma, MD;  Location: [facility];  Service: General;  Laterality: N/A;  . TONSILLECTOMY    . TOTAL KNEE ARTHROPLASTY Left 03/18/2016   Procedure: LEFT TOTAL KNEE ARTHROPLASTY;  Surgeon: Paralee Cancel, MD;  Location: [facility];  Service: Orthopedics;  Laterality: Left;    Social History   Social History  . Marital status: Married    Spouse name: N/A  . Number of children: 1  .  Years of education: N/A   Occupational History  . retired Teacher, English as a foreign language Retired   Social History Main Topics  . Smoking status: Former Smoker    Quit date: 07/03/1980  . Smokeless tobacco: Never Used  . Alcohol use 1.2 - 1.8 oz/week    1 - 2 Glasses of wine, 1 Cans of beer per week  . Drug use: No  . Sexual activity: Not on file   Other Topics Concern  . Not on file   Social History Narrative  . No narrative on file    Current Outpatient Prescriptions on File Prior to Visit  Medication Sig Dispense Refill  . aspirin EC 81 MG tablet Take 81 mg by mouth daily.    [name]  aspirin-acetaminophen-caffeine (EXCEDRIN MIGRAINE) 250-250-65 MG tablet Take 2 tablets by mouth every 6 (six) hours as needed for headache or migraine.    . cetirizine (ZYRTEC) 10 MG tablet Take 10 mg by mouth daily.    . finasteride (PROSCAR) 5 MG tablet Take 5 mg by mouth daily with breakfast.     . finasteride (PROSCAR) 5 MG tablet TAKE 1 TABLET DAILY 90 tablet 1  . furosemide (LASIX) 40 MG tablet TAKE 1 TABLET DAILY 90 tablet 3  . glucose blood (ONE TOUCH ULTRA TEST) test strip Use to check blood sugar two times a day. Dx: E11.9 200 each 3  . levothyroxine (SYNTHROID) 100 MCG tablet Take 1 tablet (100 mcg total) by mouth daily. 30 tablet 0  . Melatonin 5 MG TABS Take 5 mg by mouth at bedtime.     . Multiple Vitamin (MULTIVITAMIN WITH MINERALS) TABS tablet Take 1 tablet by mouth at bedtime.    . Multiple Vitamins-Minerals (PRESERVISION AREDS 2) CAPS Take 1 capsule by mouth 2 (two) times daily.    . Nasal Dilators (BREATHE RIGHT LARGE) STRP Place 1 strip onto the skin daily as needed (for nasal congestion.).    [name] omeprazole (PRILOSEC) 40 MG capsule Take 40 mg by mouth daily with breakfast.     . ONETOUCH DELICA LANCETS 83M MISC Use to check blood sugar two times a day. Dx: E11.9 200 each 3  . PRESCRIPTION MEDICATION every 4 (four) months. *Antibiotic Injection in Eye*    . ramipril (ALTACE) 2.5 MG capsule TAKE 1 CAPSULE DAILY 90 capsule 3  . rosuvastatin (CRESTOR) 10 MG tablet TAKE 1 TABLET DAILY 90 tablet 3  . terazosin (HYTRIN) 10 MG capsule Take 10 mg by mouth daily with breakfast.      No current facility-administered medications on file prior to visit.     Allergies  Allergen Reactions  . Codeine Nausea Only  . Sulfonamide Derivatives Hives    Family History  Problem Relation Age of Onset  . Heart failure Mother   . Coronary artery disease Mother   . Stroke Mother   . Hypothyroidism Mother   . Cancer Father        LUNG  . Cancer Sister        BREAST  . Heart failure  Maternal Grandmother   . Hypothyroidism Daughter   . Colon cancer Neg Hx     BP 112/64   Pulse (!) 50   Ht 6' (1.829 m)   Wt 206 lb (93.4 kg)   SpO2 95%   BMI 27.94 kg/m    Review of Systems Denies weight change, but he has fatigue.      Objective:   Physical Exam VITAL SIGNS:  See vs page GENERAL: no distress  Neck: a healed scar is present.  I do not appreciate a nodule in the thyroid or elsewhere in the neck.        Assessment & Plan:  Stage 2 differentiated thyroid cancer, clinically stable.  Hypocalcemia was noted Dec, 2017: he has intermitt leg cramps.   Postsurgical hypothyroidism: due for recheck.   Patient Instructions  blood tests are requested for you today.  We'll let you know about the results.  Please come back for a follow-up appointment in 4-5 months.

## 2016-09-23 ENCOUNTER — Other Ambulatory Visit: Payer: Self-pay | Admitting: Endocrinology

## 2016-09-23 DIAGNOSIS — E890 Postprocedural hypothyroidism: Secondary | ICD-10-CM

## 2016-09-23 LAB — TSH: TSH: 24.58 u[IU]/mL — ABNORMAL HIGH (ref 0.35–4.50)

## 2016-09-23 LAB — VITAMIN D 25 HYDROXY (VIT D DEFICIENCY, FRACTURES): VITD: 31.96 ng/mL (ref 30.00–100.00)

## 2016-09-23 LAB — PTH, INTACT AND CALCIUM
CALCIUM: 9.1 mg/dL (ref 8.6–10.3)
PTH: 53 pg/mL (ref 14–64)

## 2016-09-23 MED ORDER — LEVOTHYROXINE SODIUM 125 MCG PO TABS: 125.0000 ug | ORAL_TABLET | Freq: Every day | ORAL | 3 refills | Status: DC

## 2016-09-25 ENCOUNTER — Ambulatory Visit: Payer: Medicare Other | Admitting: Endocrinology

## 2016-10-15 ENCOUNTER — Telehealth: Payer: Self-pay | Admitting: Family Medicine

## 2016-10-15 ENCOUNTER — Other Ambulatory Visit: Payer: Self-pay | Admitting: Family Medicine

## 2016-10-15 NOTE — Telephone Encounter (Signed)
Left pt message asking to call Ebony Hail back directly at [phone] to schedule AWV + labs with Katha Cabal and CPE with PCP.  Note* Last AWV 10/19/15

## 2016-10-30 ENCOUNTER — Encounter: Payer: Self-pay | Admitting: Family Medicine

## 2016-10-30 ENCOUNTER — Ambulatory Visit (INDEPENDENT_AMBULATORY_CARE_PROVIDER_SITE_OTHER): Payer: Medicare Other | Admitting: Family Medicine

## 2016-10-30 DIAGNOSIS — I2510 Atherosclerotic heart disease of native coronary artery without angina pectoris: Secondary | ICD-10-CM | POA: Diagnosis not present

## 2016-10-30 DIAGNOSIS — J189 Pneumonia, unspecified organism: Secondary | ICD-10-CM | POA: Diagnosis not present

## 2016-10-30 MED ORDER — GUAIFENESIN-CODEINE 100-10 MG/5ML PO SYRP: 5.0000 mL | ORAL_SOLUTION | Freq: Every evening | ORAL | 0 refills | Status: DC | PRN

## 2016-10-30 MED ORDER — AZITHROMYCIN 250 MG PO TABS: ORAL_TABLET | ORAL | 0 refills | Status: DC

## 2016-10-30 NOTE — Patient Instructions (Signed)
Complete antibiotics.  Cough suppressant at night on full stomach.  Mucinex and flonase for fluid in ears.

## 2016-10-30 NOTE — Progress Notes (Signed)
   Subjective:    Patient ID: Dominic Kane, male    DOB: June 23, 1940, 76 y.o.   MRN: [id number]  Cough  This is a new problem. The current episode started 1 to 4 weeks ago (2 weeks). The problem has been gradually worsening. The cough is productive of sputum. Associated symptoms include chills, ear congestion, a fever, nasal congestion and a sore throat. Pertinent negatives include no ear pain, hemoptysis, shortness of breath or wheezing. Associated symptoms comments: 10.16 last night. The symptoms are aggravated by lying down. Risk factors for lung disease include smoking/tobacco exposure. Treatments tried: mucinex. The treatment provided mild relief. There is no history of asthma, bronchiectasis, bronchitis, COPD, emphysema, environmental allergies or pneumonia.  Fever   Associated symptoms include coughing and a sore throat. Pertinent negatives include no ear pain or wheezing.    Wife sick : with ear infection.  Social History /Family History/Past Medical History reviewed in detail and updated in EMR if needed.   Review of Systems  Constitutional: Positive for chills and fever.  HENT: Positive for sore throat. Negative for ear pain.   Respiratory: Positive for cough. Negative for hemoptysis, shortness of breath and wheezing.   Allergic/Immunologic: Negative for environmental allergies.       Objective:   Physical Exam        Assessment & Plan:

## 2016-10-30 NOTE — Progress Notes (Signed)
   Subjective:    Patient ID: Dominic Kane, male    DOB: 01-Jun-1940, 76 y.o.   MRN: [id number]  HPI    Review of Systems  Constitutional: Positive for fatigue and fever.  HENT: Negative for ear pain, mouth sores and nosebleeds.   Eyes: Negative for pain.  Respiratory: Positive for cough. Negative for shortness of breath and wheezing.   Cardiovascular: Negative for chest pain.  Gastrointestinal: Negative for abdominal pain.       Objective:   Physical Exam  Constitutional: Vital signs are normal. He appears well-developed and well-nourished.  Non-toxic appearance. He does not appear ill. No distress.  HENT:  Head: Normocephalic and atraumatic.  Right Ear: Hearing, tympanic membrane, external ear and ear canal normal. No tenderness. No foreign bodies. Tympanic membrane is not retracted and not bulging.  Left Ear: Hearing, tympanic membrane, external ear and ear canal normal. No tenderness. No foreign bodies. Tympanic membrane is not retracted and not bulging.  Nose: Nose normal. No mucosal edema or rhinorrhea. Right sinus exhibits no maxillary sinus tenderness and no frontal sinus tenderness. Left sinus exhibits no maxillary sinus tenderness and no frontal sinus tenderness.  Mouth/Throat: Uvula is midline, oropharynx is clear and moist and mucous membranes are normal. Normal dentition. No dental caries. No oropharyngeal exudate or tonsillar abscesses.  Eyes: Pupils are equal, round, and reactive to light. Conjunctivae, EOM and lids are normal. Lids are everted and swept, no foreign bodies found.  Neck: Trachea normal, normal range of motion and phonation normal. Neck supple. Carotid bruit is not present. No thyroid mass and no thyromegaly present.  Cardiovascular: Normal rate, regular rhythm, S1 normal, S2 normal, normal heart sounds, intact distal pulses and normal pulses.  Exam reveals no gallop.   No murmur heard. Pulmonary/Chest: Effort normal and breath sounds normal. No  respiratory distress. He has no wheezes. He has no rhonchi. He has no rales.  Abdominal: Soft. Normal appearance and bowel sounds are normal. There is no hepatosplenomegaly. There is no tenderness. There is no rebound, no guarding and no CVA tenderness. No hernia.  Neurological: He is alert. He has normal reflexes.  Skin: Skin is warm, dry and intact. No rash noted.  Psychiatric: He has a normal mood and affect. His speech is normal and behavior is normal. Judgment normal.          Assessment & Plan:

## 2016-10-30 NOTE — Assessment & Plan Note (Signed)
>   2 weeks with new fever, nml lung exam.  Treat with antibiotics for atypical pneumonia.

## 2016-11-12 DIAGNOSIS — L821 Other seborrheic keratosis: Secondary | ICD-10-CM | POA: Diagnosis not present

## 2016-11-12 DIAGNOSIS — D485 Neoplasm of uncertain behavior of skin: Secondary | ICD-10-CM | POA: Diagnosis not present

## 2016-11-12 DIAGNOSIS — D225 Melanocytic nevi of trunk: Secondary | ICD-10-CM | POA: Diagnosis not present

## 2016-11-12 DIAGNOSIS — C44619 Basal cell carcinoma of skin of left upper limb, including shoulder: Secondary | ICD-10-CM | POA: Diagnosis not present

## 2016-11-22 HISTORY — PX: BASAL CELL CARCINOMA EXCISION: SHX1214

## 2016-11-27 NOTE — Telephone Encounter (Signed)
Scheduled 01/05/17

## 2016-12-02 DIAGNOSIS — D485 Neoplasm of uncertain behavior of skin: Secondary | ICD-10-CM | POA: Diagnosis not present

## 2016-12-02 DIAGNOSIS — L089 Local infection of the skin and subcutaneous tissue, unspecified: Secondary | ICD-10-CM | POA: Diagnosis not present

## 2016-12-18 DIAGNOSIS — Z23 Encounter for immunization: Secondary | ICD-10-CM | POA: Diagnosis not present

## 2016-12-22 ENCOUNTER — Encounter (INDEPENDENT_AMBULATORY_CARE_PROVIDER_SITE_OTHER): Payer: Medicare Other | Admitting: Ophthalmology

## 2016-12-22 DIAGNOSIS — H34811 Central retinal vein occlusion, right eye, with macular edema: Secondary | ICD-10-CM

## 2016-12-22 DIAGNOSIS — E113211 Type 2 diabetes mellitus with mild nonproliferative diabetic retinopathy with macular edema, right eye: Secondary | ICD-10-CM

## 2016-12-22 DIAGNOSIS — H35033 Hypertensive retinopathy, bilateral: Secondary | ICD-10-CM | POA: Diagnosis not present

## 2016-12-22 DIAGNOSIS — E11311 Type 2 diabetes mellitus with unspecified diabetic retinopathy with macular edema: Secondary | ICD-10-CM | POA: Diagnosis not present

## 2016-12-22 DIAGNOSIS — H353111 Nonexudative age-related macular degeneration, right eye, early dry stage: Secondary | ICD-10-CM

## 2016-12-22 DIAGNOSIS — I10 Essential (primary) hypertension: Secondary | ICD-10-CM | POA: Diagnosis not present

## 2016-12-22 DIAGNOSIS — H43813 Vitreous degeneration, bilateral: Secondary | ICD-10-CM

## 2016-12-22 DIAGNOSIS — H35372 Puckering of macula, left eye: Secondary | ICD-10-CM | POA: Diagnosis not present

## 2016-12-22 DIAGNOSIS — E113292 Type 2 diabetes mellitus with mild nonproliferative diabetic retinopathy without macular edema, left eye: Secondary | ICD-10-CM | POA: Diagnosis not present

## 2016-12-23 ENCOUNTER — Ambulatory Visit: Payer: Medicare Other

## 2016-12-26 ENCOUNTER — Encounter: Payer: Medicare Other | Admitting: Family Medicine

## 2016-12-29 ENCOUNTER — Encounter: Payer: Self-pay | Admitting: Endocrinology

## 2016-12-29 ENCOUNTER — Other Ambulatory Visit (INDEPENDENT_AMBULATORY_CARE_PROVIDER_SITE_OTHER): Payer: Medicare Other

## 2016-12-29 ENCOUNTER — Ambulatory Visit (INDEPENDENT_AMBULATORY_CARE_PROVIDER_SITE_OTHER): Payer: Medicare Other | Admitting: Endocrinology

## 2016-12-29 VITALS — BP 122/78 | HR 56 | Wt 212.6 lb

## 2016-12-29 DIAGNOSIS — C73 Malignant neoplasm of thyroid gland: Secondary | ICD-10-CM

## 2016-12-29 DIAGNOSIS — E890 Postprocedural hypothyroidism: Secondary | ICD-10-CM | POA: Diagnosis not present

## 2016-12-29 DIAGNOSIS — I2510 Atherosclerotic heart disease of native coronary artery without angina pectoris: Secondary | ICD-10-CM | POA: Diagnosis not present

## 2016-12-29 LAB — TSH: TSH: 9.39 u[IU]/mL — ABNORMAL HIGH (ref 0.35–4.50)

## 2016-12-29 MED ORDER — LEVOTHYROXINE SODIUM 150 MCG PO TABS: 150.0000 ug | ORAL_TABLET | Freq: Every day | ORAL | 3 refills | Status: DC

## 2016-12-29 NOTE — Patient Instructions (Signed)
blood tests are requested for you today.  We'll let you know about the results.   Please come back for a follow-up appointment in 6 months.  

## 2016-12-29 NOTE — Progress Notes (Signed)
Subjective:    Patient ID: Dominic Kane, male    DOB: Nov 24, 1940, 76 y.o.   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   4/18: thyroidectomy:  pT3b, pN0 6/18: RAI, on synthroid, 122 mCi, stimulated by thyrogen.  6/18: post-therapy scan pos at thyroid bed only He does not notice any neck nodule.   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   Past Medical History:  Diagnosis Date  . Abnormality of gait   . Acute sinusitis, unspecified   . Allergy   . 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 Aortic valve disorders   . Arthritis    Osteoarthritis-bilateral knees, lower back issues occasionaly related to knee issues  . Benign paroxysmal positional vertigo    not in a long time  . Cataract    resolved  . CHF (congestive heart failure) ([place])   . Coronary atherosclerosis of native coronary artery   . Diabetes mellitus without complication ([place])    Diet control only.  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 Diverticulosis of colon (without mention of hemorrhage)   . Dysfunction of eustachian tube    Hard of hearing"bilateral hearing aids"  . Enlarged prostate   . GERD (gastroesophageal reflux disease)   . Headache(784.0)   . History of kidney stones    past hx. 15 yrs ago x1  . Hypertrophy of prostate without urinary obstruction and other lower urinary tract symptoms (LUTS)   . Lipoprotein deficiencies   . Other and unspecified hyperlipidemia   . Other primary cardiomyopathies   . Pain in joint, lower leg   . Personal history of colonic polyps   . Personal history of other diseases of digestive system   . Pes anserinus tendinitis or bursitis    left shoulder remains an issue  . Sinoatrial node dysfunction (HCC)    [name] follows  . Thoracic aneurysm  without mention of rupture   . Thoracic or lumbosacral neuritis or radiculitis, unspecified   . Unspecified disorder of skin and subcutaneous tissue   . Unspecified essential hypertension   . Unspecified vitamin D deficiency     Past Surgical History:  Procedure Laterality Date  . CATARACT EXTRACTION, BILATERAL    . COLONOSCOPY W/ POLYPECTOMY    . DOBUTAMINE STRESS ECHO  1/07   Lateral hypokinesis but no ischemia  . ESOPHAGOGASTRODUODENOSCOPY  11/07   Schatzki's ring, non bleeding erosive gastropathy [facility] [place]  . KNEE SURGERY Left 1947  . LYMPH NODE DISSECTION N/A 08/05/2016   Procedure: LIMITED LYMPH NODE DISSECTION;  Surgeon: Armandina Gemma, MD;  Location: [facility];  Service: General;  Laterality: N/A;  . Pulmonary functioning tests  1. 2003  2. 2005   1. Diminished lung capacity  2. Stable  . THYROIDECTOMY N/A 08/05/2016   Procedure: TOTAL THYROIDECTOMY;  Surgeon: Armandina Gemma, MD;  Location: [facility];  Service: General;  Laterality: N/A;  . TONSILLECTOMY    . TOTAL KNEE ARTHROPLASTY Left 03/18/2016   Procedure: LEFT TOTAL KNEE ARTHROPLASTY;  Surgeon: Paralee Cancel, MD;  Location: [facility];  Service: Orthopedics;  Laterality: Left;    Social History   Social History  . Marital status: Married    Spouse name: N/A  . Number of children: 1  . Years of education: N/A  Occupational History  . retired Teacher, English as a foreign language Retired   Social History Main Topics  . Smoking status: Former Smoker    Quit date: 07/03/1980  . Smokeless tobacco: Never Used  . Alcohol use 1.2 - 1.8 oz/week    1 - 2 Glasses of wine, 1 Cans of beer per week  . Drug use: No  . Sexual activity: Not on file   Other Topics Concern  . Not on file   Social History Narrative  . No narrative on file    Current Outpatient Prescriptions on File Prior to Visit  Medication Sig Dispense Refill  . APPLE CIDER VINEGAR PO Take 1 capsule by mouth daily.    [name] aspirin EC 81 MG tablet Take 81 mg by mouth daily.    [name]  azithromycin (ZITHROMAX) 250 MG tablet 2 tab po x 1 day then 1 tab po daily 6 tablet 0  . cetirizine (ZYRTEC) 10 MG tablet Take 10 mg by mouth daily.    . finasteride (PROSCAR) 5 MG tablet TAKE 1 TABLET DAILY 90 tablet 1  . furosemide (LASIX) 40 MG tablet TAKE 1 TABLET DAILY 90 tablet 3  . glucose blood (ONE TOUCH ULTRA TEST) test strip Use to check blood sugar two times a day. Dx: E11.9 200 each 3  . Melatonin 5 MG TABS Take 5 mg by mouth at bedtime.     . Multiple Vitamins-Minerals (PRESERVISION AREDS 2) CAPS Take 1 capsule by mouth 2 (two) times daily.    [name] omeprazole (PRILOSEC) 40 MG capsule TAKE 1 CAPSULE DAILY 90 capsule 1  . ONETOUCH DELICA LANCETS 19J MISC Use to check blood sugar two times a day. Dx: E11.9 200 each 3  . PRESCRIPTION MEDICATION every 4 (four) months. *Antibiotic Injection in Eye*    . ramipril (ALTACE) 2.5 MG capsule TAKE 1 CAPSULE DAILY 90 capsule 3  . rosuvastatin (CRESTOR) 10 MG tablet TAKE 1 TABLET DAILY 90 tablet 3  . terazosin (HYTRIN) 10 MG capsule TAKE 1 CAPSULE DAILY 90 capsule 1  . aspirin-acetaminophen-caffeine (EXCEDRIN MIGRAINE) 250-250-65 MG tablet Take 2 tablets by mouth every 6 (six) hours as needed for headache or migraine.    [name] guaiFENesin-codeine (ROBITUSSIN AC) 100-10 MG/5ML syrup Take 5-10 mLs by mouth at bedtime as needed for cough. (Patient not taking: Reported on 12/29/2016) 180 mL 0  . Multiple Vitamin (MULTIVITAMIN WITH MINERALS) TABS tablet Take 1 tablet by mouth at bedtime.    . Nasal Dilators (BREATHE RIGHT LARGE) STRP Place 1 strip onto the skin daily as needed (for nasal congestion.).     No current facility-administered medications on file prior to visit.     Allergies  Allergen Reactions  . Codeine Nausea Only  . Sulfonamide Derivatives Hives    Family History  Problem Relation Age of Onset  . Heart failure Mother   . Coronary artery disease Mother   . Stroke Mother   . Hypothyroidism Mother   . Cancer Father        LUNG  .  Cancer Sister        BREAST  . Heart failure Maternal Grandmother   . Hypothyroidism Daughter   . Colon cancer Neg Hx     BP 122/78   Pulse (!) 56   Wt 212 lb 9.6 oz (96.4 kg)   SpO2 96%   BMI 28.83 kg/m    Review of Systems  Denies numbness    Objective:   Physical Exam VITAL SIGNS:  See vs page GENERAL:  no distress Neck: a healed scar is present.  I do not appreciate a nodule in the thyroid or elsewhere in the neck  Lab Results  Component Value Date   TSH 9.39 (H) 12/29/2016      Assessment & Plan:  Postsurgical hypothyroidism: he needs increased rx.   Differentiated thyroid cancer: due for recheck.   Patient Instructions  blood tests are requested for you today.  We'll let you know about the results.  Please come back for a follow-up appointment in 6 months.

## 2016-12-30 LAB — THYROGLOBULIN LEVEL: Thyroglobulin: 0.1 ng/mL — ABNORMAL LOW

## 2016-12-30 LAB — THYROGLOBULIN ANTIBODY: Thyroglobulin Ab: 14 IU/mL — ABNORMAL HIGH (ref <=1)

## 2017-01-02 ENCOUNTER — Telehealth: Payer: Self-pay | Admitting: Family Medicine

## 2017-01-02 DIAGNOSIS — E11319 Type 2 diabetes mellitus with unspecified diabetic retinopathy without macular edema: Secondary | ICD-10-CM

## 2017-01-02 DIAGNOSIS — E7800 Pure hypercholesterolemia, unspecified: Secondary | ICD-10-CM

## 2017-01-02 DIAGNOSIS — E559 Vitamin D deficiency, unspecified: Secondary | ICD-10-CM

## 2017-01-02 NOTE — Telephone Encounter (Signed)
-----   Message from Eustace Pen, LPN sent at 33/83/2919 10:08 AM EDT ----- Regarding: Labs 10/15 Lab orders needed. Thank you.  Insurance:  [employer]

## 2017-01-05 ENCOUNTER — Ambulatory Visit (INDEPENDENT_AMBULATORY_CARE_PROVIDER_SITE_OTHER): Payer: Medicare Other

## 2017-01-05 VITALS — BP 110/70 | HR 65 | Temp 98.0°F | Ht 70.0 in | Wt 208.2 lb

## 2017-01-05 DIAGNOSIS — E11319 Type 2 diabetes mellitus with unspecified diabetic retinopathy without macular edema: Secondary | ICD-10-CM | POA: Diagnosis not present

## 2017-01-05 DIAGNOSIS — E7800 Pure hypercholesterolemia, unspecified: Secondary | ICD-10-CM

## 2017-01-05 DIAGNOSIS — E559 Vitamin D deficiency, unspecified: Secondary | ICD-10-CM

## 2017-01-05 DIAGNOSIS — Z0000 Encounter for general adult medical examination without abnormal findings: Secondary | ICD-10-CM | POA: Diagnosis not present

## 2017-01-05 LAB — VITAMIN D 25 HYDROXY (VIT D DEFICIENCY, FRACTURES): VITD: 35.4 ng/mL (ref 30.00–100.00)

## 2017-01-05 LAB — LIPID PANEL
Cholesterol: 117 mg/dL (ref 0–200)
HDL: 36.2 mg/dL — AB (ref >39.00)
LDL CALC: 66 mg/dL (ref 0–99)
NONHDL: 80.83
TRIGLYCERIDES: 73 mg/dL (ref 0.0–149.0)
Total CHOL/HDL Ratio: 3
VLDL: 14.6 mg/dL (ref 0.0–40.0)

## 2017-01-05 LAB — COMPREHENSIVE METABOLIC PANEL
ALBUMIN: 4.1 g/dL (ref 3.5–5.2)
ALK PHOS: 78 U/L (ref 39–117)
ALT: 14 U/L (ref 0–53)
AST: 19 U/L (ref 0–37)
BILIRUBIN TOTAL: 1.2 mg/dL (ref 0.2–1.2)
BUN: 19 mg/dL (ref 6–23)
CALCIUM: 9.6 mg/dL (ref 8.4–10.5)
CO2: 29 meq/L (ref 19–32)
CREATININE: 1.09 mg/dL (ref 0.40–1.50)
Chloride: 104 mEq/L (ref 96–112)
GFR: 69.91 mL/min (ref >60.00)
Glucose, Bld: 106 mg/dL — ABNORMAL HIGH (ref 70–99)
Potassium: 5 mEq/L (ref 3.5–5.1)
Sodium: 141 mEq/L (ref 135–145)
Total Protein: 7.2 g/dL (ref 6.0–8.3)

## 2017-01-05 LAB — HEMOGLOBIN A1C: Hgb A1c MFr Bld: 6.5 % (ref 4.6–6.5)

## 2017-01-05 NOTE — Patient Instructions (Signed)
Mr. Stofko , Thank you for taking time to come for your Medicare Wellness Visit. I appreciate your ongoing commitment to your health goals. Please review the following plan we discussed and let me know if I can assist you in the future.   These are the goals we discussed: Goals    . Health management          Starting 01/05/17, I will continue to walk 5-6 hours daily, drink 9-10 glasses of water daily, and to take medications as prescribed.        This is a list of the screening recommended for you and due dates:  Health Maintenance  Topic Date Due  . Eye exam for diabetics  05/12/2017  . Complete foot exam   06/10/2017  . Hemoglobin A1C  07/06/2017  . Colon Cancer Screening  05/02/2019  . Tetanus Vaccine  11/16/2024  . Flu Shot  Completed  . Pneumonia vaccines  Completed   Preventive Care for Adults  A healthy lifestyle and preventive care can promote health and wellness. Preventive health guidelines for adults include the following key practices.  . A routine yearly physical is a good way to check with your health care provider about your health and preventive screening. It is a chance to share any concerns and updates on your health and to receive a thorough exam.  . Visit your dentist for a routine exam and preventive care every 6 months. Brush your teeth twice a day and floss once a day. Good oral hygiene prevents tooth decay and gum disease.  . The frequency of eye exams is based on your age, health, family medical history, use  of contact lenses, and other factors. Follow your health care provider's ecommendations for frequency of eye exams.  . Eat a healthy diet. Foods like vegetables, fruits, whole grains, low-fat dairy products, and lean protein foods contain the nutrients you need without too many calories. Decrease your intake of foods high in solid fats, added sugars, and salt. Eat the right amount of calories for you. Get information about a proper diet from your  health care provider, if necessary.  . Regular physical exercise is one of the most important things you can do for your health. Most adults should get at least 150 minutes of moderate-intensity exercise (any activity that increases your heart rate and causes you to sweat) each week. In addition, most adults need muscle-strengthening exercises on 2 or more days a week.  Silver Sneakers may be a benefit available to you. To determine eligibility, you may visit the website: www.silversneakers.com or contact program at [phone] Mon-Fri between 8AM-8PM.   . Maintain a healthy weight. The body mass index (BMI) is a screening tool to identify possible weight problems. It provides an estimate of body fat based on height and weight. Your health care provider can find your BMI and can help you achieve or maintain a healthy weight.   For adults 20 years and older: ? A BMI below 18.5 is considered underweight. ? A BMI of 18.5 to 24.9 is normal. ? A BMI of 25 to 29.9 is considered overweight. ? A BMI of 30 and above is considered obese.   . Maintain normal blood lipids and cholesterol levels by exercising and minimizing your intake of saturated fat. Eat a balanced diet with plenty of fruit and vegetables. Blood tests for lipids and cholesterol should begin at age 38 and be repeated every 5 years. If your lipid or cholesterol levels are high,  you are over 50, or you are at high risk for heart disease, you may need your cholesterol levels checked more frequently. Ongoing high lipid and cholesterol levels should be treated with medicines if diet and exercise are not working.  . If you smoke, find out from your health care provider how to quit. If you do not use tobacco, please do not start.  . If you choose to drink alcohol, please do not consume more than 2 drinks per day. One drink is considered to be 12 ounces (355 mL) of beer, 5 ounces (148 mL) of wine, or 1.5 ounces (44 mL) of liquor.  . If you are  53-9 years old, ask your health care provider if you should take aspirin to prevent strokes.  . Use sunscreen. Apply sunscreen liberally and repeatedly throughout the day. You should seek shade when your shadow is shorter than you. Protect yourself by wearing long sleeves, pants, a wide-brimmed hat, and sunglasses year round, whenever you are outdoors.  . Once a month, do a whole body skin exam, using a mirror to look at the skin on your back. Tell your health care provider of new moles, moles that have irregular borders, moles that are larger than a pencil eraser, or moles that have changed in shape or color.

## 2017-01-05 NOTE — Progress Notes (Signed)
Subjective:   Dominic Kane is a 76 y.o. male who presents for Medicare Annual/Subsequent preventive examination.  Review of Systems:  N/A Cardiac Risk Factors include: advanced age (>62men, >6 women);diabetes mellitus;hypertension;dyslipidemia;male gender     Objective:    Vitals: BP 110/70 (BP Location: Right Arm, Patient Position: Sitting, Cuff Size: Normal)   Pulse 65   Temp 98 F (36.7 C) (Oral)   Ht 5\' 10"  (1.778 m) Comment: no shoes  Wt 208 lb 4 oz (94.5 kg)   SpO2 94%   BMI 29.88 kg/m   Body mass index is 29.88 kg/m.  Tobacco History  Smoking Status  . Former Smoker  . Quit date: 07/03/1980  Smokeless Tobacco  . Never Used     Counseling given: No   Past Medical History:  Diagnosis Date  . Abnormality of gait   . Acute sinusitis, unspecified   . Allergy   . 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 Aortic valve disorders   . Arthritis    Osteoarthritis-bilateral knees, lower back issues occasionaly related to knee issues  . Benign paroxysmal positional vertigo    not in a long time  . Cataract    resolved  . CHF (congestive heart failure) ([place])   . Coronary atherosclerosis of native coronary artery   . Diabetes mellitus without complication ([place])    Diet control only.  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 Diverticulosis of colon (without mention of hemorrhage)   . Dysfunction of eustachian tube    Hard of hearing"bilateral hearing aids"  . Enlarged prostate   . GERD (gastroesophageal reflux disease)   . Headache(784.0)   . History of kidney stones    past hx. 15 yrs ago x1  . Hypertrophy of prostate without urinary obstruction and other lower urinary tract symptoms (LUTS)   . Lipoprotein deficiencies   . Other and unspecified hyperlipidemia   . Other primary cardiomyopathies   . Pain in joint, lower leg   . Personal history of colonic polyps   . Personal history of other diseases of digestive  system   . Pes anserinus tendinitis or bursitis    left shoulder remains an issue  . Sinoatrial node dysfunction (HCC)    [name] follows  . 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 Thoracic or lumbosacral neuritis or radiculitis, unspecified   . Unspecified disorder of skin and subcutaneous tissue   . Unspecified essential hypertension   . Unspecified vitamin D deficiency    Past Surgical History:  Procedure Laterality Date  . BASAL CELL CARCINOMA EXCISION  11/2016   Dr. [place] Crane  . CATARACT EXTRACTION, BILATERAL    . COLONOSCOPY W/ POLYPECTOMY    . DOBUTAMINE STRESS ECHO  1/07   Lateral hypokinesis but no ischemia  . ESOPHAGOGASTRODUODENOSCOPY  11/07   Schatzki's ring, non bleeding erosive gastropathy [facility] [place]  . KNEE SURGERY Left 1947  . LYMPH NODE DISSECTION N/A 08/05/2016   Procedure: LIMITED LYMPH NODE DISSECTION;  Surgeon: Armandina Gemma, MD;  Location: [facility];  Service: General;  Laterality: N/A;  . Pulmonary functioning tests  1. 2003  2. 2005   1. Diminished lung capacity  2. Stable  . THYROIDECTOMY N/A 08/05/2016   Procedure: TOTAL THYROIDECTOMY;  Surgeon: Armandina Gemma, MD;  Location: [facility];  Service: General;  Laterality: N/A;  . TONSILLECTOMY    . TOTAL KNEE ARTHROPLASTY Left 03/18/2016   Procedure: LEFT TOTAL KNEE ARTHROPLASTY;  Surgeon: Paralee Cancel, MD;  Location: [facility];  Service: Orthopedics;  Laterality: Left;   Family History  Problem Relation Age of Onset  . Heart failure Mother   . Coronary artery disease Mother   . Stroke Mother   . Hypothyroidism Mother   . Cancer Father        LUNG  . Cancer Sister        BREAST  . Heart failure Maternal Grandmother   . Hypothyroidism Daughter   . Colon cancer Neg Hx    History  Sexual Activity  . Sexual activity: Not on file    Outpatient Encounter Prescriptions as of 01/05/2017  Medication Sig  . APPLE CIDER VINEGAR PO Take 1 capsule by mouth daily.  [name] aspirin EC 81 MG tablet Take 81 mg by  mouth daily.  [name] aspirin-acetaminophen-caffeine (EXCEDRIN MIGRAINE) 250-250-65 MG tablet Take 2 tablets by mouth every 6 (six) hours as needed for headache or migraine.  . cetirizine (ZYRTEC) 10 MG tablet Take 10 mg by mouth daily.  . finasteride (PROSCAR) 5 MG tablet TAKE 1 TABLET DAILY  . furosemide (LASIX) 40 MG tablet TAKE 1 TABLET DAILY  . glucose blood (ONE TOUCH ULTRA TEST) test strip Use to check blood sugar two times a day. Dx: E11.9  . levothyroxine (SYNTHROID, LEVOTHROID) 150 MCG tablet Take 1 tablet (150 mcg total) by mouth daily before breakfast.  . Melatonin 5 MG TABS Take 5 mg by mouth at bedtime.   . Multiple Vitamins-Minerals (PRESERVISION AREDS 2) CAPS Take 1 capsule by mouth 2 (two) times daily.  [name] omeprazole (PRILOSEC) 40 MG capsule TAKE 1 CAPSULE DAILY  . ONETOUCH DELICA LANCETS 30Z MISC Use to check blood sugar two times a day. Dx: E11.9  . PRESCRIPTION MEDICATION every 4 (four) months. *Antibiotic Injection in Eye*  . ramipril (ALTACE) 2.5 MG capsule TAKE 1 CAPSULE DAILY  . rosuvastatin (CRESTOR) 10 MG tablet TAKE 1 TABLET DAILY  . terazosin (HYTRIN) 10 MG capsule TAKE 1 CAPSULE DAILY  . [DISCONTINUED] azithromycin (ZITHROMAX) 250 MG tablet 2 tab po x 1 day then 1 tab po daily  . [DISCONTINUED] Multiple Vitamin (MULTIVITAMIN WITH MINERALS) TABS tablet Take 1 tablet by mouth at bedtime.  . [DISCONTINUED] Nasal Dilators (BREATHE RIGHT LARGE) STRP Place 1 strip onto the skin daily as needed (for nasal congestion.).  . [DISCONTINUED] guaiFENesin-codeine (ROBITUSSIN AC) 100-10 MG/5ML syrup Take 5-10 mLs by mouth at bedtime as needed for cough. (Patient not taking: Reported on 12/29/2016)   No facility-administered encounter medications on file as of 01/05/2017.     Activities of Daily Living In your present state of health, do you have any difficulty performing the following activities: 01/05/2017 08/05/2016  Hearing? Y Y  Comment bilateral hearing aids wears hearing aides    Vision? Y N  Comment macular degeneration -  Difficulty concentrating or making decisions? N N  Walking or climbing stairs? N Y  Comment - -  Dressing or bathing? N N  Doing errands, shopping? N N  Preparing Food and eating ? N -  Using the Toilet? N -  In the past six months, have you accidently leaked urine? N -  Do you have problems with loss of bowel control? N -  Managing your Medications? N -  Managing your Finances? N -  Housekeeping or managing your Housekeeping? N -  Some recent data might be hidden    Patient Care Team: Jinny Sanders, MD as PCP - General   Assessment:  Hearing Screening Comments: Bilateral hearing aids Vision Screening Comments: Last vision exam in Feb 2018  Exercise Activities and Dietary recommendations Current Exercise Habits: The patient has a physically strenous job, but has no regular exercise apart from work. 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 Health management          Starting 01/05/17, I will continue to walk 5-6 hours daily, drink 9-10 glasses of water daily, and to take medications as prescribed.       Fall Risk Fall Risk  01/05/2017 10/19/2015 10/23/2014 10/12/2014  Falls in the past year? No Yes No No  Number falls in past yr: - 2 or more - -  Injury with Fall? - No - -   Depression Screen PHQ 2/9 Scores 01/05/2017 10/19/2015 10/23/2014 10/12/2014  PHQ - 2 Score 0 0 1 0  PHQ- 9 Score 0 - - -    Cognitive Function MMSE - Mini Mental State Exam 01/05/2017  Orientation to time 5  Orientation to Place 5  Registration 3  Attention/ Calculation 0  Recall 3  Language- name 2 objects 0  Language- repeat 1  Language- follow 3 step command 3  Language- read & follow direction 0  Write a sentence 0  Copy design 0  Total score 20     PLEASE NOTE: A Mini-Cog screen was completed. Maximum score is 20. A value of 0 denotes this part of [place] MMSE was not completed  or the patient failed this part of the Mini-Cog screening.   Mini-Cog Screening Orientation to Time - Max 5 pts Orientation to Place - Max 5 pts Registration - Max 3 pts Recall - Max 3 pts Language Repeat - Max 1 pts Language Follow 3 Step Command - Max 3 pts     Immunization History  Administered Date(s) Administered  . Influenza Whole 12/23/2006, 12/09/2007, 12/22/2008  . Influenza, High Dose Seasonal PF 11/17/2014, 12/12/2015, 12/18/2016  . Influenza,inj,Quad PF,6+ Mos 01/06/2013  . Influenza-Unspecified 11/22/2013  . Pneumococcal Conjugate-13 10/12/2014  . Pneumococcal Polysaccharide-23 12/23/2006  . Td 03/24/2001  . Tdap 11/17/2014  . Zoster 06/23/2014   Screening Tests Health Maintenance  Topic Date Due  . OPHTHALMOLOGY EXAM  05/12/2017  . FOOT EXAM  06/10/2017  . HEMOGLOBIN A1C  07/06/2017  . COLONOSCOPY  05/02/2019  . TETANUS/TDAP  11/16/2024  . INFLUENZA VACCINE  Completed  . PNA vac Low Risk Adult  Completed      Plan:     I have personally reviewed and addressed the Medicare Annual Wellness questionnaire and have noted the following in the patient's chart:  A. Medical and social history B. Use of alcohol, tobacco or illicit drugs  C. Current medications and supplements D. Functional ability and status E.  Nutritional status F.  Physical activity G. Advance directives H. List of other physicians I.  Hospitalizations, surgeries, and ER visits in previous 12 months J.  [place] to include hearing, vision, cognitive, depression L. Referrals and appointments - none  In addition, I have reviewed and discussed with patient certain preventive protocols, quality metrics, and best practice recommendations. A written personalized care plan for preventive services as well as general preventive health recommendations were provided to patient.  See attached scanned questionnaire for additional information.   Signed,   Lindell Noe, MHA, BS,  LPN Health Coach

## 2017-01-05 NOTE — Progress Notes (Signed)
Pre visit review using our clinic review tool, if applicable. No additional management support is needed unless otherwise documented below in the visit note. 

## 2017-01-05 NOTE — Progress Notes (Signed)
I reviewed health advisor's note, was available for consultation, and agree with documentation and plan.   Signed,  Camylle Whicker T. Memori Sammon, MD  

## 2017-01-05 NOTE — Progress Notes (Signed)
PCP notes:   Health maintenance:  A1C - completed  Abnormal screenings:   None  Patient concerns:   None  Nurse concerns:  None  Next PCP appt:   01/08/17 @ 1400

## 2017-01-08 ENCOUNTER — Ambulatory Visit (INDEPENDENT_AMBULATORY_CARE_PROVIDER_SITE_OTHER): Payer: Medicare Other | Admitting: Family Medicine

## 2017-01-08 ENCOUNTER — Ambulatory Visit: Payer: Medicare Other | Admitting: Endocrinology

## 2017-01-08 ENCOUNTER — Encounter: Payer: Self-pay | Admitting: Family Medicine

## 2017-01-08 VITALS — BP 90/56 | HR 76 | Temp 97.9°F | Ht 70.0 in | Wt 213.5 lb

## 2017-01-08 DIAGNOSIS — E7800 Pure hypercholesterolemia, unspecified: Secondary | ICD-10-CM | POA: Diagnosis not present

## 2017-01-08 DIAGNOSIS — E6609 Other obesity due to excess calories: Secondary | ICD-10-CM | POA: Diagnosis not present

## 2017-01-08 DIAGNOSIS — C73 Malignant neoplasm of thyroid gland: Secondary | ICD-10-CM

## 2017-01-08 DIAGNOSIS — Z0000 Encounter for general adult medical examination without abnormal findings: Secondary | ICD-10-CM | POA: Diagnosis not present

## 2017-01-08 DIAGNOSIS — E11319 Type 2 diabetes mellitus with unspecified diabetic retinopathy without macular edema: Secondary | ICD-10-CM | POA: Diagnosis not present

## 2017-01-08 DIAGNOSIS — B370 Candidal stomatitis: Secondary | ICD-10-CM | POA: Insufficient documentation

## 2017-01-08 DIAGNOSIS — I509 Heart failure, unspecified: Secondary | ICD-10-CM

## 2017-01-08 DIAGNOSIS — E559 Vitamin D deficiency, unspecified: Secondary | ICD-10-CM | POA: Diagnosis not present

## 2017-01-08 DIAGNOSIS — Z6830 Body mass index (BMI) 30.0-30.9, adult: Secondary | ICD-10-CM | POA: Diagnosis not present

## 2017-01-08 DIAGNOSIS — I10 Essential (primary) hypertension: Secondary | ICD-10-CM | POA: Diagnosis not present

## 2017-01-08 LAB — HM DIABETES EYE EXAM

## 2017-01-08 MED ORDER — NYSTATIN 100000 UNIT/ML MT SUSP: 5.0000 mL | Freq: Four times a day (QID) | OROMUCOSAL | 0 refills | Status: DC

## 2017-01-08 NOTE — Patient Instructions (Signed)
Complete course of .nystatin swish and spit  Work on increase exercise as tolerated.

## 2017-01-08 NOTE — Assessment & Plan Note (Signed)
Well controlled, low normal but pt assymptomatic. Continue current medication.

## 2017-01-08 NOTE — Progress Notes (Signed)
Subjective:    Patient ID: Dominic Kane, male    DOB: 05-04-1940, 76 y.o.   MRN: [id number]  HPI  The patient presents for complete physical and review of chronic health problems.   The patient saw Candis Musa, LPN for medicare wellness. Note reviewed in detail and important notes copied below. 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 Glennie Hawk LVEF has been noted to be around 45% on studies in 2010, 2011. Echo September 2013 suggested LVEF was around 30%. Cardiac MRI December 2014 showed mild enlargement of the aortic root (4.2 cm) and and EF of 44%. Last stress test 9/13 without ischemia.   CHF, euvoemic today  BBlocker contraindicated as BP too low.  Elevated Cholesterol: At goal on  crestor Lab Results  Component Value Date   CHOL 117 01/05/2017   HDL 36.20 (L) 01/05/2017   LDLCALC 66 01/05/2017   TRIG 73.0 01/05/2017   CHOLHDL 3 01/05/2017  Using medications without problems: Muscle aches:  Diet compliance: moderate Exercise: walking at work Other complaints: Body mass index is 30.63 kg/m.   Diabetes:   He is having trouble sticking to diet. Has had some weihgt gaine Lab Results  Component Value Date   HGBA1C 6.5 01/05/2017  Using medications without difficulties: Hypoglycemic episodes: none Hyperglycemic episodes: none Feet problems: none Blood Sugars averaging: FBS: 107-11 eye exam within last year: yes  Hypothyroidism and papillary thyroid carcinoma: Stable and followed by ENDO. 07/2016 Dr. Harlow Asa  performed  thyroidectomy  Social History /Family History/Past Medical History reviewed in detail and updated in EMR if needed. Blood pressure (!) 90/56, pulse 76, temperature 97.9 F (36.6 C), temperature source Oral, height 5\' 10"  (1.778 m), weight 213 lb 8 oz (96.8 kg).  Review of Systems  Constitutional: Negative for fatigue, fever and unexpected weight change.  HENT: Negative for congestion, ear pain, postnasal drip, rhinorrhea, sore throat and trouble swallowing.   Eyes: Negative for pain.  Respiratory: Negative for cough, shortness of breath and wheezing.   Cardiovascular: Negative for chest pain, palpitations and leg swelling.  Gastrointestinal: Negative for abdominal pain, blood in stool, constipation, diarrhea and nausea.  Genitourinary: Negative for difficulty urinating, discharge, dysuria, hematuria, penile pain, penile swelling, scrotal swelling, testicular pain and urgency.  Skin: Negative for rash.  Neurological: Negative for syncope, weakness, light-headedness, numbness and headaches.  Psychiatric/Behavioral: Negative for behavioral problems and dysphoric mood. The patient is not nervous/anxious.        Objective:   Physical Exam  Constitutional: He appears well-developed and well-nourished.  Non-toxic appearance. He does not appear ill. No distress.  HENT:  Head: Normocephalic and atraumatic.  Right Ear: Hearing, tympanic membrane, external ear and ear canal normal.  Left Ear: Hearing, tympanic membrane, external ear and ear canal normal.  Nose: Nose normal.  Mouth/Throat: Uvula is midline, oropharynx is clear and moist and mucous membranes are normal.  White plaque on tounge dentures and buccal mucosa  Eyes: Pupils are equal, round, and reactive to light. Conjunctivae, EOM and lids are normal. Lids are everted and swept, no foreign bodies found.  Neck: Trachea normal, normal range of motion  and phonation normal. Neck supple. Carotid bruit is not present. No thyroid mass and no  thyromegaly present.  Cardiovascular: Normal rate, regular rhythm, S1 normal, S2 normal, intact distal pulses and normal pulses.  Exam reveals no gallop.   No murmur heard. Pulmonary/Chest: Breath sounds normal. He has no wheezes. He has no rhonchi. He has no rales.  Abdominal: Soft. Normal appearance and bowel sounds are normal. There is no hepatosplenomegaly. There is no tenderness. There is no rebound, no guarding and no CVA tenderness. No hernia.  Lymphadenopathy:    He has no cervical adenopathy.  Neurological: He is alert. He has normal strength and normal reflexes. No cranial nerve deficit or sensory deficit. Gait normal.  Skin: Skin is warm, dry and intact. No rash noted.  Psychiatric: He has a normal mood and affect. His speech is normal and behavior is normal. Judgment normal.          Assessment & Plan:  The patient's preventative maintenance and recommended screening tests for an annual wellness exam were reviewed in full today. Brought up to date unless services declined.  Counselled on the importance of diet, exercise, and its role in overall health and mortality. The patient's FH and SH was reviewed, including their home life, tobacco status, and drug and alcohol status.   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 Carlean Purl, repeat in 5 years . Former smoker 20 pipe, quit 35 years ago.  Prostate eval not indicated.

## 2017-01-08 NOTE — Assessment & Plan Note (Signed)
Encouraged exercise, weight loss, healthy eating habits. ? ?

## 2017-01-08 NOTE — Assessment & Plan Note (Signed)
S/P resection  followed by ENDO.

## 2017-01-08 NOTE — Assessment & Plan Note (Signed)
Counseled on healthy lifestyle.

## 2017-01-08 NOTE — Assessment & Plan Note (Signed)
Followed closely by opthamologist.

## 2017-01-08 NOTE — Assessment & Plan Note (Signed)
Complete course of nystatin.

## 2017-01-08 NOTE — Assessment & Plan Note (Signed)
Well controlled. Continue current medication.  

## 2017-01-08 NOTE — Assessment & Plan Note (Signed)
Euvolemic today and followed by cardiology.

## 2017-01-19 ENCOUNTER — Telehealth: Payer: Self-pay | Admitting: *Deleted

## 2017-01-19 NOTE — Telephone Encounter (Signed)
Patient wife called and states she left paperwork for Dr. Loanne Drilling to complete and they are missing pathology report confirming the DX code. This paperwork work was from [employer]. The phone number is call to get the fax number is [phone] please add. The certificate number [id number] and the claim number [id number].  Please advise. If you have any questions please call the patient . Contact number is [phone]

## 2017-01-20 NOTE — Telephone Encounter (Signed)
I called [facility] & they stated they actually need pathology report?

## 2017-01-20 NOTE — Telephone Encounter (Signed)
They need to get from [facility] med records

## 2017-01-20 NOTE — Telephone Encounter (Signed)
Before I call [facility] could you advise me on what to print off to send them? I see where he had the RIA under imaging done? I am unclear on what's needed?

## 2017-01-20 NOTE — Telephone Encounter (Signed)
ICD-10 code is C60

## 2017-01-22 NOTE — Telephone Encounter (Signed)
After calling [facility] where Dr. Harlow Asa is located I was transferred to medical records but the VM box was full. I called patient & stated that I had been on hold a very long time with the cancer center & due to having a full day of patients it's hard for me to be on hold that long.I was told by [facility] medical records that most liking his insurance company [facility] would most likely have to send a request. So when I called patient I gave him number to cancer center & their fax if that's what [facility] needed to send request for pathology report. Patient stated that he would try to give them a call. I stated that if he had any more issues that we could help him to address to give [place] a call.

## 2017-01-28 ENCOUNTER — Telehealth: Payer: Self-pay | Admitting: *Deleted

## 2017-01-28 NOTE — Telephone Encounter (Signed)
Copied from [place]. Topic: General - Other >> Jan 28, 2017  9:38 AM Malena Catholic I, NT wrote: Reason for CRM: Pt. Returning call from practice. No CRM in pt chart describing why practice called pt. Practice please advise why pt. Was called.   >> Jan 28, 2017 10:15 AM Modena Nunnery, CMA wrote: Attempted to contact pt; no record of recent labs, imaging, or referrals in chart  Spoke with Dominic Kane.  He states he listened to his voicemail and it was an automated phone call about getting the flu shot.  He states he has already had his flu shot this year.

## 2017-02-03 ENCOUNTER — Encounter: Payer: Self-pay | Admitting: Nurse Practitioner

## 2017-02-03 ENCOUNTER — Ambulatory Visit (INDEPENDENT_AMBULATORY_CARE_PROVIDER_SITE_OTHER): Payer: Medicare Other | Admitting: Nurse Practitioner

## 2017-02-03 ENCOUNTER — Ambulatory Visit: Payer: Medicare Other | Admitting: Nurse Practitioner

## 2017-02-03 VITALS — BP 104/66 | HR 77 | Temp 98.6°F | Ht 70.0 in | Wt 212.0 lb

## 2017-02-03 DIAGNOSIS — M7062 Trochanteric bursitis, left hip: Secondary | ICD-10-CM | POA: Diagnosis not present

## 2017-02-03 MED ORDER — METHYLPREDNISOLONE ACETATE 40 MG/ML IJ SUSP
40.0000 mg | Freq: Once | INTRAMUSCULAR | Status: AC
  Administered 2017-02-03: 40 mg via INTRA_ARTICULAR

## 2017-02-03 NOTE — Progress Notes (Signed)
Subjective:  Patient ID: YOUSUF Kane, male    DOB: [birth year]  Age: 76 y.o. MRN: [id number]  CC: Hip Pain (left hip pain---1 day---)   Hip Pain   The incident occurred 12 to 24 hours ago. There was no injury mechanism. The pain is present in the left hip. The quality of the pain is described as aching and burning. The pain has been constant since onset. Pertinent negatives include no inability to bear weight, loss of motion, loss of sensation, muscle weakness, numbness or tingling. The symptoms are aggravated by weight bearing, palpation and movement. He has tried rest for the symptoms. The treatment provided no relief.   Left hip pain x 2days.  Outpatient Medications Prior to Visit  Medication Sig Dispense Refill  . APPLE CIDER VINEGAR PO Take 1 capsule by mouth daily.    [name] aspirin EC 81 MG tablet Take 81 mg by mouth daily.    [name] aspirin-acetaminophen-caffeine (EXCEDRIN MIGRAINE) 250-250-65 MG tablet Take 2 tablets by mouth every 6 (six) hours as needed for headache or migraine.    . cetirizine (ZYRTEC) 10 MG tablet Take 10 mg by mouth daily.    . finasteride (PROSCAR) 5 MG tablet TAKE 1 TABLET DAILY 90 tablet 1  . furosemide (LASIX) 40 MG tablet TAKE 1 TABLET DAILY 90 tablet 3  . glucose blood (ONE TOUCH ULTRA TEST) test strip Use to check blood sugar two times a day. Dx: E11.9 200 each 3  . levothyroxine (SYNTHROID, LEVOTHROID) 150 MCG tablet Take 1 tablet (150 mcg total) by mouth daily before breakfast. 90 tablet 3  . Melatonin 5 MG TABS Take 5 mg by mouth at bedtime.     . Multiple Vitamins-Minerals (PRESERVISION AREDS 2) CAPS Take 1 capsule by mouth 2 (two) times daily.    [name] nystatin (MYCOSTATIN) 100000 UNIT/ML suspension Take 5 mLs (500,000 Units total) by mouth 4 (four) times daily. 200 mL 0  . omeprazole (PRILOSEC) 40 MG capsule TAKE 1 CAPSULE DAILY 90 capsule 1  . ONETOUCH DELICA LANCETS 44W MISC Use to check blood sugar two times a day. Dx: E11.9 200 each 3  . PRESCRIPTION  MEDICATION every 4 (four) months. *Antibiotic Injection in Eye*    . ramipril (ALTACE) 2.5 MG capsule TAKE 1 CAPSULE DAILY 90 capsule 3  . rosuvastatin (CRESTOR) 10 MG tablet TAKE 1 TABLET DAILY 90 tablet 3  . terazosin (HYTRIN) 10 MG capsule TAKE 1 CAPSULE DAILY 90 capsule 1   No facility-administered medications prior to visit.     ROS See HPI  Objective:  BP 104/66   Pulse 77   Temp 98.6 F (37 C)   Ht 5\' 10"  (1.778 m)   Wt 212 lb (96.2 kg)   SpO2 95%   BMI 30.42 kg/m   BP Readings from Last 3 Encounters:  02/03/17 104/66  01/08/17 (!) 90/56  01/05/17 110/70    Wt Readings from Last 3 Encounters:  02/03/17 212 lb (96.2 kg)  01/08/17 213 lb 8 oz (96.8 kg)  01/05/17 208 lb 4 oz (94.5 kg)    Physical Exam  Constitutional: He is oriented to person, place, and time. No distress.  Cardiovascular: Normal rate.  Pulmonary/Chest: Effort normal.  Musculoskeletal: He exhibits tenderness. He exhibits no edema.       Left hip: He exhibits tenderness. He exhibits normal range of motion, normal strength, no bony tenderness, no swelling, no deformity and no laceration.  Tenderness over trochanter bursa.  Neurological: He is alert  and oriented to person, place, and time.  Skin: Skin is warm and dry. No rash noted. No erythema.  Psychiatric: He has a normal mood and affect. His behavior is normal.  Nursing note and vitals reviewed.   Lab Results  Component Value Date   WBC 9.7 07/30/2016   HGB 13.0 07/30/2016   HCT 39.4 07/30/2016   PLT 200 07/30/2016   GLUCOSE 106 (H) 01/05/2017   CHOL 117 01/05/2017   TRIG 73.0 01/05/2017   HDL 36.20 (L) 01/05/2017   LDLCALC 66 01/05/2017   ALT 14 01/05/2017   AST 19 01/05/2017   NA 141 01/05/2017   K 5.0 01/05/2017   CL 104 01/05/2017   CREATININE 1.09 01/05/2017   BUN 19 01/05/2017   CO2 29 01/05/2017   TSH 9.39 (H) 12/29/2016   PSA 1.48 05/24/2012   HGBA1C 6.5 01/05/2017    Nm Whole Body I131 Scan S/p Ca Rx  Result Date:  09/08/2016 CLINICAL DATA:  Thyroid cancer post thyroidectomy, 10 days post postoperative radioactive iodine ablation EXAM: NUCLEAR MEDICINE I-131 POST THERAPY WHOLE BODY SCAN TECHNIQUE: The patient received 122 mCi I-131 sodium iodide for the treatment of thyroid cancer within the past 10 days. The patient returns today, and whole body scanning was performed in the anterior and posterior projections. COMPARISON:  None.  None FINDINGS: Abnormal radio iodine accumulation at the LEFT thyroid bed likely representing thyroid remnant. No additional abnormal sites of radio iodine accumulation are identified to suggest iodine-avid metastatic thyroid cancer. Excreted tracer is seen within the colon from hepatic flexure to rectum. IMPRESSION: Small LEFT thyroid remnant. No scintigraphic evidence of iodine-avid metastatic thyroid cancer. Electronically Signed   By: Lavonia Dana M.D.   On: 09/08/2016 15:30    Procedure Note :     Procedure : left hip pain   Indication: left trochanter bursitis   Risks including unsuccessful procedure , bleeding, infection, bruising, skin atrophy, "steroid flare-up" and others were explained to the patient in detail as well as the benefits. Verbal consent was obtained.  The patient was placed in a comfortable position. Lateral approach was used. Skin was prepped with Betadine and alcohol  and anesthetized a cooling spray. Then, a 10cc syringe with a 1.5 inch long 23-gauge needle was used for a joint injection. The needle was advanced  Into the left lateral hip joint cavity. I aspirated a small amount of intra-articular fluid to confirm correct placement of the needle and injected the joint with 67mL of 2% lidocaine and 40mg  of Depo-Medrol .  Band-Aid was applied.   Tolerated well. Complications: None. Good pain relief following the procedure.   Postprocedure instructions :    A Band-Aid should be left on for 12 hours. Injection therapy is not a cure itself. It is used in  conjunction with other modalities. Dominic Kane can use nonsteroidal anti-inflammatories like ibuprofen , hot and cold compresses. Rest is recommended in the next 24 hours. Dominic Kane need to report immediately  if fever, chills or any signs of infection develop.   Assessment & Plan:   Dominic Kane Kane was seen today for hip pain.  Diagnoses and all orders for this visit:  Greater trochanteric bursitis of left hip -     methylPREDNISolone acetate (DEPO-MEDROL) injection 40 mg   I am having Dominic Kane Bushy. Kane maintain his Melatonin, PRESERVISION AREDS 2, ONETOUCH DELICA LANCETS 61W, glucose blood, aspirin EC, ramipril, rosuvastatin, cetirizine, aspirin-acetaminophen-caffeine, PRESCRIPTION MEDICATION, furosemide, finasteride, omeprazole, terazosin, APPLE CIDER VINEGAR PO, levothyroxine, and nystatin. We administered  methylPREDNISolone acetate.  Meds ordered this encounter  Medications  . methylPREDNISolone acetate (DEPO-MEDROL) injection 40 mg    Follow-up: No Follow-up on file.  Wilfred Lacy, NP

## 2017-02-03 NOTE — Patient Instructions (Signed)
Apply cold compress every 2hrs till bedtime (5mins at a time).  May use tylenol or ibuprofen OTC pain (with food).  Trochanteric Bursitis Trochanteric bursitis is a condition that causes hip pain. Trochanteric bursitis happens when fluid-filled sacs (bursae) in the hip get irritated. Normally these sacs absorb shock and help strong bands of tissue (tendons) in your hip glide smoothly over each other and over your hip bones. What are the causes? This condition results from increased friction between the hip bones and the tendons that go over them. This condition can happen if you:  Have weak hips.  Use your hip muscles too much (overuse).  Get hit in the hip.  What increases the risk? This condition is more likely to develop in:  Women.  Adults who are middle-aged or older.  People with arthritis or a spinal condition.  People with weak buttocks muscles (gluteal muscles).  People who have one leg that is shorter than the other.  People who participate in certain kinds of athletic activities, such as: ? Running sports, especially long-distance running. ? Contact sports, like football or martial arts. ? Sports in which falls may occur, like skiing.  What are the signs or symptoms? The main symptom of this condition is pain and tenderness over the point of your hip. The pain may be:  Sharp and intense.  Dull and achy.  Felt on the outside of your thigh.  It may increase when you:  Lie on your side.  Walk or run.  Go up on stairs.  Sit.  Stand up after sitting.  Stand for long periods of time.  How is this diagnosed? This condition may be diagnosed based on:  Your symptoms.  Your medical history.  A physical exam.  Imaging tests, such as: ? X-rays to check your bones. ? An MRI or ultrasound to check your tendons and muscles.  During your physical exam, your health care provider will check the movement and strength of your hip. He or she may press on  the point of your hip to check for pain. How is this treated? This condition may be treated by:  Resting.  Reducing your activity.  Avoiding activities that cause pain.  Using crutches, a cane, or a walker to decrease the strain on your hip.  Taking medicine to help with swelling.  Having medicine injected into the bursae to help with swelling.  Using ice, heat, and massage therapy for pain relief.  Physical therapy exercises for strength and flexibility.  Surgery (rare).  Follow these instructions at home: Activity  Rest.  Avoid activities that cause pain.  Return to your normal activities as told by your health care provider. Ask your health care provider what activities are safe for you. Managing pain, stiffness, and swelling  Take over-the-counter and prescription medicines only as told by your health care provider.  If directed, apply heat to the injured area as told by your health care provider. ? Place a towel between your skin and the heat source. ? Leave the heat on for 20-30 minutes. ? Remove the heat if your skin turns bright red. This is especially important if you are unable to feel pain, heat, or cold. You may have a greater risk of getting burned.  If directed, apply ice to the injured area: ? Put ice in a plastic bag. ? Place a towel between your skin and the bag. ? Leave the ice on for 20 minutes, 2-3 times a day. General instructions  If  the affected leg is one that you use for driving, ask your health care provider when it is safe to drive.  Use crutches, a cane, or a walker as told by your health care provider.  If one of your legs is shorter than the other, get fitted for a shoe insert.  Lose weight if you are overweight. How is this prevented?  Wear supportive footwear that is appropriate for your sport.  If you have hip pain, start any new exercise or sport slowly.  Maintain physical fitness,  including: ? Strength. ? Flexibility. Contact a health care provider if:  Your pain does not improve with 2-4 weeks. Get help right away if:  You develop severe pain.  You have a fever.  You develop increased redness over your hip.  You have a change in your bowel function or bladder function.  You cannot control the muscles in your feet. This information is not intended to replace advice given to you by your health care provider. Make sure you discuss any questions you have with your health care provider. Document Released: 04/17/2004 Document Revised: 11/14/2015 Document Reviewed: 02/23/2015 Elsevier Interactive Patient Education  [employer].

## 2017-02-05 ENCOUNTER — Ambulatory Visit: Payer: Medicare Other | Admitting: Family Medicine

## 2017-03-08 ENCOUNTER — Other Ambulatory Visit: Payer: Self-pay | Admitting: Family Medicine

## 2017-04-03 ENCOUNTER — Other Ambulatory Visit: Payer: Self-pay | Admitting: Family Medicine

## 2017-04-14 DIAGNOSIS — Z85850 Personal history of malignant neoplasm of thyroid: Secondary | ICD-10-CM | POA: Diagnosis not present

## 2017-04-20 ENCOUNTER — Encounter (INDEPENDENT_AMBULATORY_CARE_PROVIDER_SITE_OTHER): Payer: Medicare Other | Admitting: Ophthalmology

## 2017-04-20 DIAGNOSIS — E113393 Type 2 diabetes mellitus with moderate nonproliferative diabetic retinopathy without macular edema, bilateral: Secondary | ICD-10-CM | POA: Diagnosis not present

## 2017-04-20 DIAGNOSIS — H353111 Nonexudative age-related macular degeneration, right eye, early dry stage: Secondary | ICD-10-CM | POA: Diagnosis not present

## 2017-04-20 DIAGNOSIS — H35372 Puckering of macula, left eye: Secondary | ICD-10-CM | POA: Diagnosis not present

## 2017-04-20 DIAGNOSIS — E11319 Type 2 diabetes mellitus with unspecified diabetic retinopathy without macular edema: Secondary | ICD-10-CM

## 2017-04-20 DIAGNOSIS — H34811 Central retinal vein occlusion, right eye, with macular edema: Secondary | ICD-10-CM | POA: Diagnosis not present

## 2017-04-20 DIAGNOSIS — H43813 Vitreous degeneration, bilateral: Secondary | ICD-10-CM | POA: Diagnosis not present

## 2017-04-20 DIAGNOSIS — H35033 Hypertensive retinopathy, bilateral: Secondary | ICD-10-CM

## 2017-04-20 DIAGNOSIS — I10 Essential (primary) hypertension: Secondary | ICD-10-CM

## 2017-04-20 LAB — HM DIABETES EYE EXAM

## 2017-04-22 ENCOUNTER — Encounter: Payer: Self-pay | Admitting: Family Medicine

## 2017-05-11 DIAGNOSIS — H40023 Open angle with borderline findings, high risk, bilateral: Secondary | ICD-10-CM | POA: Diagnosis not present

## 2017-05-11 DIAGNOSIS — H04123 Dry eye syndrome of bilateral lacrimal glands: Secondary | ICD-10-CM | POA: Diagnosis not present

## 2017-05-11 DIAGNOSIS — H348112 Central retinal vein occlusion, right eye, stable: Secondary | ICD-10-CM | POA: Diagnosis not present

## 2017-05-11 DIAGNOSIS — Z9841 Cataract extraction status, right eye: Secondary | ICD-10-CM | POA: Diagnosis not present

## 2017-05-11 DIAGNOSIS — Z9842 Cataract extraction status, left eye: Secondary | ICD-10-CM | POA: Diagnosis not present

## 2017-05-11 DIAGNOSIS — H16223 Keratoconjunctivitis sicca, not specified as Sjogren's, bilateral: Secondary | ICD-10-CM | POA: Diagnosis not present

## 2017-05-25 DIAGNOSIS — H348112 Central retinal vein occlusion, right eye, stable: Secondary | ICD-10-CM | POA: Diagnosis not present

## 2017-05-25 DIAGNOSIS — H40023 Open angle with borderline findings, high risk, bilateral: Secondary | ICD-10-CM | POA: Diagnosis not present

## 2017-06-02 DIAGNOSIS — Z1283 Encounter for screening for malignant neoplasm of skin: Secondary | ICD-10-CM | POA: Diagnosis not present

## 2017-06-02 DIAGNOSIS — D485 Neoplasm of uncertain behavior of skin: Secondary | ICD-10-CM | POA: Diagnosis not present

## 2017-06-02 DIAGNOSIS — D225 Melanocytic nevi of trunk: Secondary | ICD-10-CM | POA: Diagnosis not present

## 2017-06-02 DIAGNOSIS — D2271 Melanocytic nevi of right lower limb, including hip: Secondary | ICD-10-CM | POA: Diagnosis not present

## 2017-06-05 ENCOUNTER — Encounter: Payer: Self-pay | Admitting: Cardiovascular Disease

## 2017-06-15 ENCOUNTER — Encounter: Payer: Self-pay | Admitting: Cardiovascular Disease

## 2017-06-15 ENCOUNTER — Ambulatory Visit (INDEPENDENT_AMBULATORY_CARE_PROVIDER_SITE_OTHER): Payer: Medicare Other | Admitting: Cardiovascular Disease

## 2017-06-15 VITALS — BP 120/82 | HR 49 | Ht 70.0 in | Wt 211.1 lb

## 2017-06-15 DIAGNOSIS — I5022 Chronic systolic (congestive) heart failure: Secondary | ICD-10-CM | POA: Diagnosis not present

## 2017-06-15 DIAGNOSIS — I712 Thoracic aortic aneurysm, without rupture, unspecified: Secondary | ICD-10-CM

## 2017-06-15 DIAGNOSIS — E7800 Pure hypercholesterolemia, unspecified: Secondary | ICD-10-CM | POA: Diagnosis not present

## 2017-06-15 DIAGNOSIS — I2510 Atherosclerotic heart disease of native coronary artery without angina pectoris: Secondary | ICD-10-CM | POA: Diagnosis not present

## 2017-06-15 DIAGNOSIS — I428 Other cardiomyopathies: Secondary | ICD-10-CM | POA: Diagnosis not present

## 2017-06-15 NOTE — Patient Instructions (Signed)

## 2017-06-15 NOTE — Progress Notes (Signed)
i  Chief Complaint  Patient presents with  . Follow-up    CAD    History of Present Illness: 77 yo male with a past medical history significant for diastolic and systolic CHF, hypertension, nonobstructive coronary artery disease, non-ischemic cardiomyopathy, benign prostatic hypertrophy and hyperlipidemia who is here today for cardiac follow up. Prior to 2005, he was known to have LVEF around 35%. He had an episode of chest pain in 2005 leading to a cardiac cath May 2005 which showed diffuse 15% irregularities in the LAD, diffuse 20% irregularities in the ramus intermedius branch, diffuse 30% irregularities in the nondominant circumflex, and a 50% stenosis in the proximal RCA with irregularities in the midportion of the vessel. The ascending aorta was mildly dilated with no evidence of dissection during that catheterization. Ejection fraction was noted to be 65%. An echocardiogram during that admission in May 2005 in [place] showed an ejection fraction of 55-60% with the ascending aorta at the sinotubular junction measuring 4.1 cm. LVEF has been noted to be around 45% on studies in 2010, 2011. Echo September 2013 suggested LVEF was around 30%. Cardiac MRI December 2014 showed mild enlargement of the aortic root (4.2 cm) and and EF of 44%. Last stress test 9/13 without ischemia.   He is here today for follow up. The patient denies any chest pain, dyspnea, palpitations, lower extremity edema, orthopnea, PND, dizziness, near syncope or syncope.    Primary Care Physician: Jinny Sanders, MD  Past Medical History:  Diagnosis Date  . Abnormality of gait   . Acute sinusitis, unspecified   . Allergy   . 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 Aortic valve disorders   . Arthritis    Osteoarthritis-bilateral knees, lower back issues occasionaly related to knee issues  . Benign paroxysmal positional vertigo    not in a long time  . Cataract    resolved  . CHF (congestive heart  failure) ([place])   . Coronary atherosclerosis of native coronary artery   . Diabetes mellitus without complication ([place])    Diet control only.  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 Diverticulosis of colon (without mention of hemorrhage)   . Dysfunction of eustachian tube    Hard of hearing"bilateral hearing aids"  . Enlarged prostate   . GERD (gastroesophageal reflux disease)   . Headache(784.0)   . History of kidney stones    past hx. 15 yrs ago x1  . Hypertrophy of prostate without urinary obstruction and other lower urinary tract symptoms (LUTS)   . Lipoprotein deficiencies   . Other and unspecified hyperlipidemia   . Other primary cardiomyopathies   . Pain in joint, lower leg   . Personal history of colonic polyps   . Personal history of other diseases of digestive system   . Pes anserinus tendinitis or bursitis    left shoulder remains an issue  . Sinoatrial node dysfunction (HCC)    [name] follows  . 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 Thoracic or lumbosacral neuritis or radiculitis, unspecified   . Unspecified disorder of skin and subcutaneous tissue   . Unspecified essential hypertension   . Unspecified vitamin D deficiency     Past Surgical History:  Procedure Laterality Date  . BASAL CELL CARCINOMA EXCISION  11/2016   Dr. Nevada Crane  . CATARACT EXTRACTION, BILATERAL    . COLONOSCOPY W/ POLYPECTOMY    . DOBUTAMINE STRESS ECHO  1/07  Lateral hypokinesis but no ischemia  . ESOPHAGOGASTRODUODENOSCOPY  11/07   Schatzki's ring, non bleeding erosive gastropathy Charlotte [place]  . KNEE SURGERY Left 1947  . LYMPH NODE DISSECTION N/A 08/05/2016   Procedure: LIMITED LYMPH NODE DISSECTION;  Surgeon: Armandina Gemma, MD;  Location: [facility];  Service: General;  Laterality: N/A;  . Pulmonary functioning tests  1. 2003  2. 2005   1. Diminished lung capacity  2. Stable  . THYROIDECTOMY N/A 08/05/2016   Procedure: TOTAL THYROIDECTOMY;  Surgeon:  Armandina Gemma, MD;  Location: [facility];  Service: General;  Laterality: N/A;  . TONSILLECTOMY    . TOTAL KNEE ARTHROPLASTY Left 03/18/2016   Procedure: LEFT TOTAL KNEE ARTHROPLASTY;  Surgeon: Paralee Cancel, MD;  Location: [facility];  Service: Orthopedics;  Laterality: Left;    Current Outpatient Medications  Medication Sig Dispense Refill  . aspirin EC 81 MG tablet Take 81 mg by mouth daily.    . cetirizine (ZYRTEC) 10 MG tablet Take 10 mg by mouth daily.    . finasteride (PROSCAR) 5 MG tablet TAKE 1 TABLET DAILY 90 tablet 3  . furosemide (LASIX) 40 MG tablet TAKE 1 TABLET DAILY 90 tablet 3  . glucose blood (ONE TOUCH ULTRA TEST) test strip Use to check blood sugar two times a day. Dx: E11.9 200 each 3  . levothyroxine (SYNTHROID, LEVOTHROID) 150 MCG tablet Take 1 tablet (150 mcg total) by mouth daily before breakfast. 90 tablet 3  . Melatonin 5 MG TABS Take 5 mg by mouth at bedtime.     [name] omeprazole (PRILOSEC) 40 MG capsule TAKE 1 CAPSULE DAILY 90 capsule 1  . ONETOUCH DELICA LANCETS 66Y MISC Use to check blood sugar two times a day. Dx: E11.9 200 each 3  . PRESCRIPTION MEDICATION every 4 (four) months. *Antibiotic Injection in Eye*    . ramipril (ALTACE) 2.5 MG capsule TAKE 1 CAPSULE DAILY 90 capsule 3  . rosuvastatin (CRESTOR) 10 MG tablet TAKE 1 TABLET DAILY 90 tablet 3  . terazosin (HYTRIN) 10 MG capsule TAKE 1 CAPSULE DAILY 90 capsule 1   No current facility-administered medications for this visit.     Allergies  Allergen Reactions  . Codeine Nausea Only  . Sulfonamide Derivatives Hives    Social History   Socioeconomic History  . Marital status: Married    Spouse name: Not on file  . Number of children: 1  . Years of education: Not on file  . Highest education level: Not on file  Occupational History  . 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 Financial resource strain: Not on file  . Food insecurity:    Worry: Not on file    Inability:  Not on file  . Transportation needs:    Medical: Not on file    Non-medical: Not on file  Tobacco Use  . Smoking status: Former Smoker    Last attempt to quit: 07/03/1980    Years since quitting: 36.9  . Smokeless tobacco: Never Used  Substance and Sexual Activity  . Alcohol use: Yes    Alcohol/week: 1.2 - 1.8 oz    Types: 1 - 2 Glasses of wine, 1 Cans of beer per week  . Drug use: No  . Sexual activity: Not on file  Lifestyle  . Physical activity:    Days per week: Not on file    Minutes per session: Not on file  . Stress: Not on file  Relationships  .  Social connections:    Talks on phone: Not on file    Gets together: Not on file    Attends religious service: Not on file    Active member of club or organization: Not on file    Attends meetings of clubs or organizations: Not on file    Relationship status: Not on file  . Intimate partner violence:    Fear of current or ex partner: Not on file    Emotionally abused: Not on file    Physically abused: Not on file    Forced sexual activity: Not on file  Other Topics Concern  . Not on file  Social History Narrative  . Not on file    Family History  Problem Relation Age of Onset  . Heart failure Mother   . Coronary artery disease Mother   . Stroke Mother   . Hypothyroidism Mother   . Cancer Father        LUNG  . Cancer Sister        BREAST  . Heart failure Maternal Grandmother   . Hypothyroidism Daughter   . Colon cancer Neg Hx     Review of Systems:  As stated in the HPI and otherwise negative.   BP 120/82   Pulse (!) 49   Ht 5\' 10"  (1.778 m)   Wt 211 lb 1.9 oz (95.8 kg)   SpO2 94%   BMI 30.29 kg/m   Physical Examination:  General: Well developed, well nourished, NAD  HEENT: OP clear, mucus membranes moist  SKIN: warm, dry. No rashes. Neuro: No focal deficits  Musculoskeletal: Muscle strength 5/5 all ext  Psychiatric: Mood and affect normal  Neck: No JVD, no carotid bruits, no thyromegaly, no  lymphadenopathy.  Lungs:Clear bilaterally, no wheezes, rhonci, crackles Cardiovascular: Regular rate and rhythm. No murmurs, gallops or rubs. Abdomen:Soft. Bowel sounds present. Non-tender.  Extremities: No lower extremity edema. Pulses are 2 + in the bilateral DP/PT.  Echo 12/10/11: Left ventricle: The cavity size was normal. Wall thickness was increased in a pattern of mild LVH. The estimated ejection fraction was 30%. Diffuse hypokinesis. Doppler parameters are consistent with abnormal left ventricular relaxation (grade 1 diastolic dysfunction). - Aortic valve: There was no stenosis. Trivial regurgitation. - Aorta: Ascending aorta dimension: 43 mm. Mildly dilated aortic root and ascending aorta. Aortic root dimension:80mm (ED). - Mitral valve: Trivial regurgitation. - Left atrium: The atrium was mildly dilated. - Right ventricle: The cavity size was normal. Systolic function was mildly to moderately reduced. - Tricuspid valve: Peak RV-RA gradient: 80mm Hg (S). - Pulmonary arteries: PA peak pressure: 42mm Hg (S). - Systemic veins: IVC measured 1.9 cm with some respirophasic variation, suggesting RA pressure 10 mmHg. Impressions:  - Normal LV size with mild LV hypertrophy. EF 30% with diffuse hypokinesis. Normal RV size with mild to moderate hypokinesis. No significant valvular abnormalities. Dilation of the aortic root and ascending aorta. Would consider MRA of the chest to further delineate the thoracic aorta.  Cardiac MRI 02/22/13: On limited views of the lung fields, there were no gross abnormalities. Normal left ventricular size with normal wall thickness. There was global hypokinesis with EF calculated to be 44%. The right ventricle was normal in size with mild systolic dysfunction. The left atrium was mildly dilated. The right atrium was normal in size. The aortic valve was trileaflet. It opened well, I do not think that there was significant aortic stenosis. There  was trivial aortic insufficiency. Significant mitral regurgitation was not noted.  On delayed enhancement imaging, there was no myocardial delayed enhancement. On MRA of the chest, the aortic root and ascending aorta were mildly dilated. The arch and descending aorta were normal in caliber. The arch vessels originated normally. The pulmonary veins drained normally to the left atrium. Measurements: LV EDV 146 mL LV SV 64 mL LV EF 44% Aortic root 4.2 cm Ascending aorta 4.1 cm (maximal diameter) Arch 3.3 cm Descending thoracic aorta 3.0 cm IMPRESSION: 1. Normal LV size with mild global hypokinesis, EF 44%. There was no myocardial delayed enhancement so no definitive evidence for prior myocardial infarction, infiltrative disease, or myocarditis. 2. Normal RV size with mildly decreased systolic function. 3. Mild aortic root (4.2 cm) and ascending thoracic aorta (4.1 cm) Dilation.  EKG:  EKG is ordered today. The ekg ordered today demonstrates Sinus bradycardia, rate 49 bpm. RBBB. LAFB.   Recent Labs: 07/30/2016: Hemoglobin 13.0; Platelets 200 12/29/2016: TSH 9.39 01/05/2017: ALT 14; BUN 19; Creatinine, Ser 1.09; Potassium 5.0; Sodium 141   Lipid Panel    Component Value Date/Time   CHOL 117 01/05/2017 0850   TRIG 73.0 01/05/2017 0850   HDL 36.20 (L) 01/05/2017 0850   CHOLHDL 3 01/05/2017 0850   VLDL 14.6 01/05/2017 0850   LDLCALC 66 01/05/2017 0850     Wt Readings from Last 3 Encounters:  06/15/17 211 lb 1.9 oz (95.8 kg)  02/03/17 212 lb (96.2 kg)  01/08/17 213 lb 8 oz (96.8 kg)     Other studies Reviewed: Additional studies/ records that were reviewed today include: . Review of the above records demonstrates:    Assessment and Plan:   1. CAD without angina: He has no chest pain. He is known to have non-obstructive disease by cath in 2005. Will continue ASA and statin.   2. Non-ischemic Cardiomyopathy: LVEF 44% by cardiac MRI December 2014. Will continue the Ace-inh.  He is not on a beta blocker due to bradycardia.   3. Thoracic aortic aneurysm:  Stable at 4.2 cm by CTA in March 2018. Given stability over the last 14 years will repeat CTA in 3 years.    4. HLD: He is on a statin. LDL is at goal.   5. Chronic systolic CHF: Volume status is ok. Continue Lasix.   6. Carotid bruit, right: Carotid dopplers January 2016 with no evidence of carotid artery disease.   7. Diabetes, type 2: diet controlled. Followed in primary care.   Current medicines are reviewed at length with the patient today.  The patient does not have concerns regarding medicines.  The following changes have been made:  no change  Labs/ tests ordered today include:   No orders of the defined types were placed in this encounter.    Disposition:   FU with me in 12 months   Signed, Lauree Chandler, MD 06/15/2017 9:53 AM    [facility] [place], [place], [place]  [address] Phone: [phone]; Fax: [phone]

## 2017-06-19 NOTE — Addendum Note (Signed)
Addended by: Mendel Ryder on: 06/19/2017 07:18 AM   Modules accepted: Orders

## 2017-06-22 ENCOUNTER — Ambulatory Visit (INDEPENDENT_AMBULATORY_CARE_PROVIDER_SITE_OTHER): Payer: Medicare Other | Admitting: Endocrinology

## 2017-06-22 ENCOUNTER — Encounter: Payer: Self-pay | Admitting: Endocrinology

## 2017-06-22 VITALS — BP 102/64 | HR 62 | Wt 212.2 lb

## 2017-06-22 DIAGNOSIS — I2510 Atherosclerotic heart disease of native coronary artery without angina pectoris: Secondary | ICD-10-CM

## 2017-06-22 DIAGNOSIS — E890 Postprocedural hypothyroidism: Secondary | ICD-10-CM | POA: Diagnosis not present

## 2017-06-22 DIAGNOSIS — C73 Malignant neoplasm of thyroid gland: Secondary | ICD-10-CM

## 2017-06-22 LAB — TSH: TSH: 1 u[IU]/mL (ref 0.35–4.50)

## 2017-06-22 NOTE — Patient Instructions (Signed)
blood tests are requested for you today.  We'll let you know about the results.   Please come back for a follow-up appointment in 6 months.  

## 2017-06-22 NOTE — Progress Notes (Signed)
Subjective:    Patient ID: Dominic Kane, male    DOB: March 29, 1940, 77 y.o.   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   4/18: thyroidectomy:  pT3b, pN0 6/18: RAI, on synthroid, 122 mCi, stimulated by thyrogen.  6/18: post-therapy scan pos at thyroid bed only  10/18: TG is undetectable (ab pos) He does not notice any neck nodule.   Past Medical History:  Diagnosis Date  . Abnormality of gait   . Acute sinusitis, unspecified   . Allergy   . 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 Aortic valve disorders   . Arthritis    Osteoarthritis-bilateral knees, lower back issues occasionaly related to knee issues  . Benign paroxysmal positional vertigo    not in a long time  . Cataract    resolved  . CHF (congestive heart failure) ([place])   . Coronary atherosclerosis of native coronary artery   . Diabetes mellitus without complication ([place])    Diet control only.  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 Diverticulosis of colon (without mention of hemorrhage)   . Dysfunction of eustachian tube    Hard of hearing"bilateral hearing aids"  . Enlarged prostate   . GERD (gastroesophageal reflux disease)   . Headache(784.0)   . History of kidney stones    past hx. 15 yrs ago x1  . Hypertrophy of prostate without urinary obstruction and other lower urinary tract symptoms (LUTS)   . Lipoprotein deficiencies   . Other and unspecified hyperlipidemia   . Other primary cardiomyopathies   . Pain in joint, lower leg   . Personal history of colonic polyps   . Personal history of other diseases of digestive system   . Pes anserinus tendinitis or bursitis    left shoulder remains an issue  . Sinoatrial node dysfunction (HCC)    [name] follows  . 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 Thoracic or lumbosacral neuritis or radiculitis, unspecified   . Unspecified disorder of skin and  subcutaneous tissue   . Unspecified essential hypertension   . Unspecified vitamin D deficiency     Past Surgical History:  Procedure Laterality Date  . BASAL CELL CARCINOMA EXCISION  11/2016   Dr. Nevada Crane  . CATARACT EXTRACTION, BILATERAL    . COLONOSCOPY W/ POLYPECTOMY    . DOBUTAMINE STRESS ECHO  1/07   Lateral hypokinesis but no ischemia  . ESOPHAGOGASTRODUODENOSCOPY  11/07   Schatzki's ring, non bleeding erosive gastropathy Charlotte [place]  . KNEE SURGERY Left 1947  . LYMPH NODE DISSECTION N/A 08/05/2016   Procedure: LIMITED LYMPH NODE DISSECTION;  Surgeon: Armandina Gemma, MD;  Location: [facility];  Service: General;  Laterality: N/A;  . Pulmonary functioning tests  1. 2003  2. 2005   1. Diminished lung capacity  2. Stable  . THYROIDECTOMY N/A 08/05/2016   Procedure: TOTAL THYROIDECTOMY;  Surgeon: Armandina Gemma, MD;  Location: [facility];  Service: General;  Laterality: N/A;  . TONSILLECTOMY    . TOTAL KNEE ARTHROPLASTY Left 03/18/2016   Procedure: LEFT TOTAL KNEE ARTHROPLASTY;  Surgeon: Paralee Cancel, MD;  Location: [facility];  Service: Orthopedics;  Laterality: Left;    Social History   Socioeconomic History  . Marital status: Married    Spouse name: Not on file  . Number of children: 1  . Years of education: Not on file  . Highest education level: Not  on file  Occupational History  . 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 Financial resource strain: Not on file  . Food insecurity:    Worry: Not on file    Inability: Not on file  . Transportation needs:    Medical: Not on file    Non-medical: Not on file  Tobacco Use  . Smoking status: Former Smoker    Last attempt to quit: 07/03/1980    Years since quitting: 36.9  . Smokeless tobacco: Never Used  Substance and Sexual Activity  . Alcohol use: Yes    Alcohol/week: 1.2 - 1.8 oz    Types: 1 - 2 Glasses of wine, 1 Cans of beer per week  . Drug use: No  . Sexual activity: Not on file  Lifestyle  .  Physical activity:    Days per week: Not on file    Minutes per session: Not on file  . Stress: Not on file  Relationships  . Social connections:    Talks on phone: Not on file    Gets together: Not on file    Attends religious service: Not on file    Active member of club or organization: Not on file    Attends meetings of clubs or organizations: Not on file    Relationship status: Not on file  . Intimate partner violence:    Fear of current or ex partner: Not on file    Emotionally abused: Not on file    Physically abused: Not on file    Forced sexual activity: Not on file  Other Topics Concern  . Not on file  Social History Narrative  . Not on file    Current Outpatient Medications on File Prior to Visit  Medication Sig Dispense Refill  . aspirin EC 81 MG tablet Take 81 mg by mouth daily.    . cetirizine (ZYRTEC) 10 MG tablet Take 10 mg by mouth daily.    . finasteride (PROSCAR) 5 MG tablet TAKE 1 TABLET DAILY 90 tablet 3  . furosemide (LASIX) 40 MG tablet TAKE 1 TABLET DAILY 90 tablet 3  . glucose blood (ONE TOUCH ULTRA TEST) test strip Use to check blood sugar two times a day. Dx: E11.9 200 each 3  . levothyroxine (SYNTHROID, LEVOTHROID) 150 MCG tablet Take 1 tablet (150 mcg total) by mouth daily before breakfast. 90 tablet 3  . Melatonin 5 MG TABS Take 5 mg by mouth at bedtime.     [name] omeprazole (PRILOSEC) 40 MG capsule TAKE 1 CAPSULE DAILY 90 capsule 1  . ONETOUCH DELICA LANCETS 62I MISC Use to check blood sugar two times a day. Dx: E11.9 200 each 3  . PRESCRIPTION MEDICATION every 4 (four) months. *Antibiotic Injection in Eye*    . ramipril (ALTACE) 2.5 MG capsule TAKE 1 CAPSULE DAILY 90 capsule 3  . rosuvastatin (CRESTOR) 10 MG tablet TAKE 1 TABLET DAILY 90 tablet 3  . terazosin (HYTRIN) 10 MG capsule TAKE 1 CAPSULE DAILY 90 capsule 1   No current facility-administered medications on file prior to visit.     Allergies  Allergen Reactions  . Codeine Nausea Only  .  Sulfonamide Derivatives Hives    Family History  Problem Relation Age of Onset  . Heart failure Mother   . Coronary artery disease Mother   . Stroke Mother   . Hypothyroidism Mother   . Cancer Father        LUNG  . Cancer  Sister        BREAST  . Heart failure Maternal Grandmother   . Hypothyroidism Daughter   . Colon cancer Neg Hx     BP 102/64 (BP Location: Left Arm, Patient Position: Sitting, Cuff Size: Normal)   Pulse 62   Wt 212 lb 3.2 oz (96.3 kg)   SpO2 94%   BMI 30.45 kg/m    Review of Systems Denies neck pain    Objective:   Physical Exam VITAL SIGNS:  See vs page GENERAL: no distress Neck: a healed scar is present.  I do not appreciate a nodule in the thyroid or elsewhere in the neck       Assessment & Plan:  Differentiated thyroid cancer, due for recheck.  We discussed the fact that antibodies complicate the interpretation of TG level, but antibodies often decrease with time.   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     Patient Instructions  blood tests are requested for you today.  We'll let you know about the results. Please come back for a follow-up appointment in 6 months.

## 2017-06-23 LAB — THYROGLOBULIN ANTIBODY: THYROGLOBULIN AB: 2 [IU]/mL — AB (ref <=1)

## 2017-06-23 LAB — THYROGLOBULIN LEVEL: Thyroglobulin: <0.1 ng/mL — ABNORMAL LOW

## 2017-07-02 ENCOUNTER — Other Ambulatory Visit: Payer: Self-pay | Admitting: Cardiovascular Disease

## 2017-07-20 ENCOUNTER — Other Ambulatory Visit: Payer: Self-pay | Admitting: Cardiovascular Disease

## 2017-08-12 DIAGNOSIS — D485 Neoplasm of uncertain behavior of skin: Secondary | ICD-10-CM | POA: Diagnosis not present

## 2017-08-12 DIAGNOSIS — Z1283 Encounter for screening for malignant neoplasm of skin: Secondary | ICD-10-CM | POA: Diagnosis not present

## 2017-08-31 ENCOUNTER — Encounter (INDEPENDENT_AMBULATORY_CARE_PROVIDER_SITE_OTHER): Payer: Medicare Other | Admitting: Ophthalmology

## 2017-08-31 DIAGNOSIS — E11319 Type 2 diabetes mellitus with unspecified diabetic retinopathy without macular edema: Secondary | ICD-10-CM

## 2017-08-31 DIAGNOSIS — H353111 Nonexudative age-related macular degeneration, right eye, early dry stage: Secondary | ICD-10-CM

## 2017-08-31 DIAGNOSIS — I10 Essential (primary) hypertension: Secondary | ICD-10-CM

## 2017-08-31 DIAGNOSIS — H34811 Central retinal vein occlusion, right eye, with macular edema: Secondary | ICD-10-CM | POA: Diagnosis not present

## 2017-08-31 DIAGNOSIS — E113293 Type 2 diabetes mellitus with mild nonproliferative diabetic retinopathy without macular edema, bilateral: Secondary | ICD-10-CM | POA: Diagnosis not present

## 2017-08-31 DIAGNOSIS — H43813 Vitreous degeneration, bilateral: Secondary | ICD-10-CM

## 2017-08-31 DIAGNOSIS — H35033 Hypertensive retinopathy, bilateral: Secondary | ICD-10-CM

## 2017-09-23 ENCOUNTER — Ambulatory Visit (INDEPENDENT_AMBULATORY_CARE_PROVIDER_SITE_OTHER): Payer: Medicare Other | Admitting: Family Medicine

## 2017-09-23 ENCOUNTER — Encounter: Payer: Self-pay | Admitting: Family Medicine

## 2017-09-23 VITALS — BP 120/72 | HR 55 | Temp 97.8°F | Ht 70.0 in | Wt 210.2 lb

## 2017-09-23 DIAGNOSIS — R0789 Other chest pain: Secondary | ICD-10-CM | POA: Insufficient documentation

## 2017-09-23 DIAGNOSIS — W19XXXA Unspecified fall, initial encounter: Secondary | ICD-10-CM | POA: Diagnosis not present

## 2017-09-23 DIAGNOSIS — H6981 Other specified disorders of Eustachian tube, right ear: Secondary | ICD-10-CM | POA: Diagnosis not present

## 2017-09-23 DIAGNOSIS — S299XXA Unspecified injury of thorax, initial encounter: Secondary | ICD-10-CM

## 2017-09-23 DIAGNOSIS — I2510 Atherosclerotic heart disease of native coronary artery without angina pectoris: Secondary | ICD-10-CM | POA: Diagnosis not present

## 2017-09-23 MED ORDER — TRAMADOL HCL 50 MG PO TABS: 50.0000 mg | ORAL_TABLET | Freq: Every evening | ORAL | 0 refills | Status: DC | PRN

## 2017-09-23 NOTE — Assessment & Plan Note (Signed)
No clear fracture. Encouraged pt to take deep breaths. Tylenol for pain and tramadol for breakthrough pain.

## 2017-09-23 NOTE — Progress Notes (Signed)
   Subjective:    Patient ID: Dominic Kane, male    DOB: 12-11-1940, 77 y.o.   MRN: [id number]  Fall  Pertinent negatives include no abdominal pain, fever or headaches.     77 year old male presents with new onset right ear pain.  Ongoing x 2 weeks off and on.  No dizziness,  Has some nasal congestion, occ sneeze.  Decrease in hearing in right.  Did also have fullness and pain in left ear but this went away.  No sinus pain, no fever.  no ear discharge.   He also fell accidentally yesterday   ( tripped) and hit right side of chest wall. Arm hit his chest wall.  Pain with twisting chest, deep breaths.  keeping him up at night.  Aleve, tylenol.. No relief.  Social History /Family History/Past Medical History reviewed in detail and updated in EMR if needed. Blood pressure 120/72, pulse (!) 55, temperature 97.8 F (36.6 C), temperature source Oral, height 5\' 10"  (1.778 m), weight 210 lb 4 oz (95.4 kg).  Review of Systems  Constitutional: Negative for fatigue and fever.  HENT: Negative for ear pain.   Eyes: Negative for pain.  Respiratory: Negative for cough and shortness of breath.   Cardiovascular: Negative for chest pain, palpitations and leg swelling.  Gastrointestinal: Negative for abdominal pain.  Genitourinary: Negative for dysuria.  Musculoskeletal: Negative for arthralgias.  Neurological: Negative for syncope, light-headedness and headaches.  Psychiatric/Behavioral: Negative for dysphoric mood.       Objective:   Physical Exam  Constitutional: Vital signs are normal. He appears well-developed and well-nourished.  HENT:  Head: Normocephalic.  Right Ear: Hearing normal. A middle ear effusion is present.  Left Ear: Hearing, tympanic membrane, external ear and ear canal normal.  Nose: Nose normal.  Mouth/Throat: Oropharynx is clear and moist and mucous membranes are normal.  Neck: Trachea normal. Carotid bruit is not present. No thyroid mass and no thyromegaly  present.  Cardiovascular: Normal rate, regular rhythm and normal pulses. Exam reveals no gallop, no distant heart sounds and no friction rub.  No murmur heard. No peripheral edema  Pulmonary/Chest: Effort normal and breath sounds normal. No respiratory distress.  No contusion, ttp over  Right chest wall diffuse.. No focal pain  Musculoskeletal:       Thoracic back: Normal.       Lumbar back: Normal.  Skin: Skin is warm, dry and intact. No rash noted.  Psychiatric: He has a normal mood and affect. His speech is normal and behavior is normal. Thought content normal.          Assessment & Plan:

## 2017-09-23 NOTE — Assessment & Plan Note (Signed)
Nasal steroid BID x 2 weeks.

## 2017-09-23 NOTE — Patient Instructions (Signed)
Start flonase 2 sprays per nostril daily.  Use tylenol for pain during the day. Can use tramadol for breakthrough pain at night.  Take deep breaths.  Call if not improving as expected or shortness of breath.

## 2017-09-23 NOTE — Assessment & Plan Note (Signed)
No clear proceeding symptoms.  Pt does have poor balance.

## 2017-09-30 ENCOUNTER — Other Ambulatory Visit: Payer: Self-pay | Admitting: Family Medicine

## 2017-10-02 ENCOUNTER — Other Ambulatory Visit: Payer: Self-pay | Admitting: Family Medicine

## 2017-10-02 MED ORDER — TRAMADOL HCL 50 MG PO TABS: 50.0000 mg | ORAL_TABLET | Freq: Every evening | ORAL | 0 refills | Status: DC | PRN

## 2017-10-02 NOTE — Telephone Encounter (Signed)
Name of Medication: Tramadol Name of Pharmacy: [facility] [place] or Written Date and Quantity: # 10 on 09/23/17 Last Office Visit and Type: 09/23/17 acute for fall and ear pain Next Office Visit and Type: none scheduled Last Controlled Substance Agreement Date: none Last [id number]

## 2017-10-02 NOTE — Telephone Encounter (Signed)
tramadol refill Last Refill:09/23/17 # 10 Last OV: 09/23/17 [phone]

## 2017-10-02 NOTE — Telephone Encounter (Signed)
Copied from [place] [id number]. Topic: Quick Communication - Rx Refill/Question >> Oct 02, 2017  2:06 PM Boyd Kerbs wrote: Medication:  traMADol (ULTRAM) 50 MG tablet He is needing more as he is trying to do what needs to be done for his ribs  Has the patient contacted their pharmacy? No. (Agent: If no, request that the patient contact the pharmacy for the refill.) (Agent: If yes, when and what did the pharmacy advise?)  Preferred Pharmacy (with phone number or street name):  [facility] [place] [place] [facility] [address] [place] [address] Phone: [phone] Fax: [phone]    Agent: Please be advised that RX refills may take up to 3 business days. We ask that you follow-up with your pharmacy.

## 2017-10-12 DIAGNOSIS — H6123 Impacted cerumen, bilateral: Secondary | ICD-10-CM | POA: Diagnosis not present

## 2017-10-15 DIAGNOSIS — T162XXA Foreign body in left ear, initial encounter: Secondary | ICD-10-CM | POA: Diagnosis not present

## 2017-10-15 DIAGNOSIS — J3489 Other specified disorders of nose and nasal sinuses: Secondary | ICD-10-CM | POA: Diagnosis not present

## 2017-12-16 ENCOUNTER — Other Ambulatory Visit: Payer: Self-pay | Admitting: Endocrinology

## 2017-12-22 ENCOUNTER — Encounter: Payer: Self-pay | Admitting: Endocrinology

## 2017-12-22 ENCOUNTER — Ambulatory Visit (INDEPENDENT_AMBULATORY_CARE_PROVIDER_SITE_OTHER): Payer: Medicare Other | Admitting: Endocrinology

## 2017-12-22 VITALS — BP 130/80 | HR 55 | Ht 70.0 in | Wt 211.4 lb

## 2017-12-22 DIAGNOSIS — I2510 Atherosclerotic heart disease of native coronary artery without angina pectoris: Secondary | ICD-10-CM | POA: Diagnosis not present

## 2017-12-22 DIAGNOSIS — Z23 Encounter for immunization: Secondary | ICD-10-CM

## 2017-12-22 DIAGNOSIS — C73 Malignant neoplasm of thyroid gland: Secondary | ICD-10-CM

## 2017-12-22 LAB — TSH: TSH: 1.15 u[IU]/mL (ref 0.35–4.50)

## 2017-12-22 NOTE — Progress Notes (Signed)
Subjective:    Patient ID: Dominic Kane, male    DOB: [birth year], 77 y.o.   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   4/18: thyroidectomy:  pT3b, pN0 6/18: RAI, on synthroid, 122 mCi, stimulated by thyrogen.  6/18: post-therapy scan pos at thyroid bed only.  10/18: TG is undetectable (ab pos).  4/19: TG is undetectable (ab pos, but lower titer).  He does not notice any neck nodule.  Past Medical History:  Diagnosis Date  . Abnormality of gait   . Acute sinusitis, unspecified   . Allergy   . 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 Aortic valve disorders   . Arthritis    Osteoarthritis-bilateral knees, lower back issues occasionaly related to knee issues  . Benign paroxysmal positional vertigo    not in a long time  . Cataract    resolved  . CHF (congestive heart failure) ([place])   . Coronary atherosclerosis of native coronary artery   . Diabetes mellitus without complication ([place])    Diet control only.  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 Diverticulosis of colon (without mention of hemorrhage)   . Dysfunction of eustachian tube    Hard of hearing"bilateral hearing aids"  . Enlarged prostate   . GERD (gastroesophageal reflux disease)   . Headache(784.0)   . History of kidney stones    past hx. 15 yrs ago x1  . Hypertrophy of prostate without urinary obstruction and other lower urinary tract symptoms (LUTS)   . Lipoprotein deficiencies   . Other and unspecified hyperlipidemia   . Other primary cardiomyopathies   . Pain in joint, lower leg   . Personal history of colonic polyps   . Personal history of other diseases of digestive system   . Pes anserinus tendinitis or bursitis    left shoulder remains an issue  . Sinoatrial node dysfunction (HCC)    [name] follows  . 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 Thoracic or lumbosacral neuritis or radiculitis,  unspecified   . Unspecified disorder of skin and subcutaneous tissue   . Unspecified essential hypertension   . Unspecified vitamin D deficiency     Past Surgical History:  Procedure Laterality Date  . BASAL CELL CARCINOMA EXCISION  11/2016   Dr. [place] Crane  . CATARACT EXTRACTION, BILATERAL    . COLONOSCOPY W/ POLYPECTOMY    . DOBUTAMINE STRESS ECHO  1/07   Lateral hypokinesis but no ischemia  . ESOPHAGOGASTRODUODENOSCOPY  11/07   Schatzki's ring, non bleeding erosive gastropathy [place]  . KNEE SURGERY Left 1947  . LYMPH NODE DISSECTION N/A 08/05/2016   Procedure: LIMITED LYMPH NODE DISSECTION;  Surgeon: Armandina Gemma, MD;  Location: [facility];  Service: General;  Laterality: N/A;  . Pulmonary functioning tests  1. 2003  2. 2005   1. Diminished lung capacity  2. Stable  . THYROIDECTOMY N/A 08/05/2016   Procedure: TOTAL THYROIDECTOMY;  Surgeon: Armandina Gemma, MD;  Location: [facility];  Service: General;  Laterality: N/A;  . TONSILLECTOMY    . TOTAL KNEE ARTHROPLASTY Left 03/18/2016   Procedure: LEFT TOTAL KNEE ARTHROPLASTY;  Surgeon: Paralee Cancel, MD;  Location: [facility];  Service: Orthopedics;  Laterality: Left;    Social History   Socioeconomic History  . Marital status: Married    Spouse name: Not on file  . Number of children: 1  . Years of  education: Not on file  . Highest education level: Not on file  Occupational History  . 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 Financial resource strain: Not on file  . Food insecurity:    Worry: Not on file    Inability: Not on file  . Transportation needs:    Medical: Not on file    Non-medical: Not on file  Tobacco Use  . Smoking status: Former Smoker    Last attempt to quit: 07/03/1980    Years since quitting: 37.4  . Smokeless tobacco: Never Used  Substance and Sexual Activity  . Alcohol use: Yes    Alcohol/week: 2.0 - 3.0 standard drinks    Types: 1 - 2 Glasses of wine, 1 Cans of beer per week    . Drug use: No  . Sexual activity: Not on file  Lifestyle  . Physical activity:    Days per week: Not on file    Minutes per session: Not on file  . Stress: Not on file  Relationships  . Social connections:    Talks on phone: Not on file    Gets together: Not on file    Attends religious service: Not on file    Active member of club or organization: Not on file    Attends meetings of clubs or organizations: Not on file    Relationship status: Not on file  . Intimate partner violence:    Fear of current or ex partner: Not on file    Emotionally abused: Not on file    Physically abused: Not on file    Forced sexual activity: Not on file  Other Topics Concern  . Not on file  Social History Narrative  . Not on file    Current Outpatient Medications on File Prior to Visit  Medication Sig Dispense Refill  . aspirin EC 81 MG tablet Take 81 mg by mouth daily.    . cetirizine (ZYRTEC) 10 MG tablet Take 10 mg by mouth daily.    . finasteride (PROSCAR) 5 MG tablet TAKE 1 TABLET DAILY 90 tablet 3  . furosemide (LASIX) 40 MG tablet TAKE 1 TABLET DAILY 90 tablet 3  . glucose blood (ONE TOUCH ULTRA TEST) test strip Use to check blood sugar two times a day. Dx: E11.9 200 each 3  . levothyroxine (SYNTHROID, LEVOTHROID) 150 MCG tablet TAKE 1 TABLET (150 MCG TOTAL) BY MOUTH DAILY BEFORE BREAKFAST. 90 tablet 3  . Melatonin 5 MG TABS Take 5 mg by mouth at bedtime.     [name] omeprazole (PRILOSEC) 40 MG capsule TAKE 1 CAPSULE DAILY 90 capsule 0  . ONETOUCH DELICA LANCETS 67J MISC Use to check blood sugar two times a day. Dx: E11.9 200 each 3  . PRESCRIPTION MEDICATION every 4 (four) months. *Antibiotic Injection in Eye*    . ramipril (ALTACE) 2.5 MG capsule TAKE 1 CAPSULE DAILY 90 capsule 3  . rosuvastatin (CRESTOR) 10 MG tablet TAKE 1 TABLET DAILY 90 tablet 3  . terazosin (HYTRIN) 10 MG capsule TAKE 1 CAPSULE DAILY 90 capsule 0   No current facility-administered medications on file prior to visit.      Allergies  Allergen Reactions  . Codeine Nausea Only  . Sulfonamide Derivatives Hives    Family History  Problem Relation Age of Onset  . Heart failure Mother   . Coronary artery disease Mother   . Stroke Mother   . Hypothyroidism Mother   . Cancer  Father        LUNG  . Cancer Sister        BREAST  . Heart failure Maternal Grandmother   . Hypothyroidism Daughter   . Colon cancer Neg Hx     BP 130/80   Pulse (!) 55   Ht 5' 10"  (1.778 m)   Wt 211 lb 6.4 oz (95.9 kg)   SpO2 96%   BMI 30.33 kg/m    Review of Systems Denies neck pain    Objective:   Physical Exam VITAL SIGNS:  See vs page GENERAL: no distress Neck: a healed scar is present.  I do not appreciate a nodule in the thyroid or elsewhere in the neck.    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    Patient Instructions  blood tests are requested for you today.  We'll let you know about the results. Please come back for a follow-up appointment in 6 months.

## 2017-12-22 NOTE — Patient Instructions (Signed)
blood tests are requested for you today.  We'll let you know about the results.   Please come back for a follow-up appointment in 6 months.  

## 2017-12-23 LAB — THYROGLOBULIN LEVEL: Thyroglobulin: 0.1 ng/mL — ABNORMAL LOW

## 2017-12-23 LAB — THYROGLOBULIN ANTIBODY: THYROGLOBULIN AB: 1 [IU]/mL (ref <=1)

## 2017-12-28 ENCOUNTER — Other Ambulatory Visit: Payer: Self-pay | Admitting: Family Medicine

## 2017-12-28 ENCOUNTER — Encounter (INDEPENDENT_AMBULATORY_CARE_PROVIDER_SITE_OTHER): Payer: Medicare Other | Admitting: Ophthalmology

## 2017-12-28 DIAGNOSIS — H34811 Central retinal vein occlusion, right eye, with macular edema: Secondary | ICD-10-CM

## 2017-12-28 DIAGNOSIS — H353112 Nonexudative age-related macular degeneration, right eye, intermediate dry stage: Secondary | ICD-10-CM | POA: Diagnosis not present

## 2017-12-28 DIAGNOSIS — H43813 Vitreous degeneration, bilateral: Secondary | ICD-10-CM | POA: Diagnosis not present

## 2017-12-28 DIAGNOSIS — I10 Essential (primary) hypertension: Secondary | ICD-10-CM | POA: Diagnosis not present

## 2017-12-28 DIAGNOSIS — H35372 Puckering of macula, left eye: Secondary | ICD-10-CM

## 2017-12-28 DIAGNOSIS — H35342 Macular cyst, hole, or pseudohole, left eye: Secondary | ICD-10-CM

## 2017-12-28 DIAGNOSIS — H35033 Hypertensive retinopathy, bilateral: Secondary | ICD-10-CM | POA: Diagnosis not present

## 2018-01-25 ENCOUNTER — Ambulatory Visit: Payer: Medicare Other

## 2018-02-08 ENCOUNTER — Telehealth: Payer: Self-pay | Admitting: Family Medicine

## 2018-02-08 DIAGNOSIS — E11319 Type 2 diabetes mellitus with unspecified diabetic retinopathy without macular edema: Secondary | ICD-10-CM

## 2018-02-08 DIAGNOSIS — E890 Postprocedural hypothyroidism: Secondary | ICD-10-CM

## 2018-02-08 DIAGNOSIS — E559 Vitamin D deficiency, unspecified: Secondary | ICD-10-CM

## 2018-02-08 NOTE — Telephone Encounter (Signed)
-----   Message from Eustace Pen, LPN sent at 29/05/7953 12:58 PM EST ----- Regarding: Labs 11/19 Lab orders needed. Thank you.  Insurance:  [employer]

## 2018-02-09 ENCOUNTER — Ambulatory Visit (INDEPENDENT_AMBULATORY_CARE_PROVIDER_SITE_OTHER): Payer: Medicare Other

## 2018-02-09 VITALS — BP 108/80 | HR 60 | Temp 97.7°F | Ht 71.25 in | Wt 209.5 lb

## 2018-02-09 DIAGNOSIS — E559 Vitamin D deficiency, unspecified: Secondary | ICD-10-CM | POA: Diagnosis not present

## 2018-02-09 DIAGNOSIS — E11319 Type 2 diabetes mellitus with unspecified diabetic retinopathy without macular edema: Secondary | ICD-10-CM | POA: Diagnosis not present

## 2018-02-09 DIAGNOSIS — Z0000 Encounter for general adult medical examination without abnormal findings: Secondary | ICD-10-CM | POA: Diagnosis not present

## 2018-02-09 LAB — LIPID PANEL
CHOLESTEROL: 112 mg/dL (ref 0–200)
HDL: 40.4 mg/dL (ref >39.00)
LDL Cholesterol: 59 mg/dL (ref 0–99)
NonHDL: 71.44
TRIGLYCERIDES: 60 mg/dL (ref 0.0–149.0)
Total CHOL/HDL Ratio: 3
VLDL: 12 mg/dL (ref 0.0–40.0)

## 2018-02-09 LAB — COMPREHENSIVE METABOLIC PANEL
ALBUMIN: 4.1 g/dL (ref 3.5–5.2)
ALT: 12 U/L (ref 0–53)
AST: 14 U/L (ref 0–37)
Alkaline Phosphatase: 76 U/L (ref 39–117)
BILIRUBIN TOTAL: 1.1 mg/dL (ref 0.2–1.2)
BUN: 23 mg/dL (ref 6–23)
CALCIUM: 9.1 mg/dL (ref 8.4–10.5)
CO2: 26 mEq/L (ref 19–32)
CREATININE: 1 mg/dL (ref 0.40–1.50)
Chloride: 106 mEq/L (ref 96–112)
GFR: 76.99 mL/min (ref >60.00)
Glucose, Bld: 109 mg/dL — ABNORMAL HIGH (ref 70–99)
Potassium: 4 mEq/L (ref 3.5–5.1)
Sodium: 140 mEq/L (ref 135–145)
Total Protein: 6.9 g/dL (ref 6.0–8.3)

## 2018-02-09 LAB — VITAMIN D 25 HYDROXY (VIT D DEFICIENCY, FRACTURES): VITD: 29.41 ng/mL — ABNORMAL LOW (ref 30.00–100.00)

## 2018-02-09 LAB — HEMOGLOBIN A1C: Hgb A1c MFr Bld: 6.6 % — ABNORMAL HIGH (ref 4.6–6.5)

## 2018-02-09 NOTE — Progress Notes (Signed)
I reviewed health advisor's note, was available for consultation, and agree with documentation and plan.  

## 2018-02-09 NOTE — Patient Instructions (Signed)
Dominic Kane , Thank you for taking time to come for your Medicare Wellness Visit. I appreciate your ongoing commitment to your health goals. Please review the following plan we discussed and let me know if I can assist you in the future.   These are the goals we discussed: Goals    . Health management     Starting 02/09/2018, I will continue to walk 5-6 hours daily, drink 9-10 glasses of water daily, and to take medications as prescribed.        This is a list of the screening recommended for you and due dates:  Health Maintenance  Topic Date Due  . Complete foot exam   02/12/2018*  . Eye exam for diabetics  04/20/2018  . Hemoglobin A1C  08/10/2018  . Colon Cancer Screening  05/02/2019  . Tetanus Vaccine  11/16/2024  . Flu Shot  Completed  . Pneumonia vaccines  Completed  *Topic was postponed. The date shown is not the original due date.   Preventive Care for Adults  A healthy lifestyle and preventive care can promote health and wellness. Preventive health guidelines for adults include the following key practices.  . A routine yearly physical is a good way to check with your health care provider about your health and preventive screening. It is a chance to share any concerns and updates on your health and to receive a thorough exam.  . Visit your dentist for a routine exam and preventive care every 6 months. Brush your teeth twice a day and floss once a day. Good oral hygiene prevents tooth decay and gum disease.  . The frequency of eye exams is based on your age, health, family medical history, use  of contact lenses, and other factors. Follow your health care provider's recommendations for frequency of eye exams.  . Eat a healthy diet. Foods like vegetables, fruits, whole grains, low-fat dairy products, and lean protein foods contain the nutrients you need without too many calories. Decrease your intake of foods high in solid fats, added sugars, and salt. Eat the right amount of  calories for you. Get information about a proper diet from your health care provider, if necessary.  . Regular physical exercise is one of the most important things you can do for your health. Most adults should get at least 150 minutes of moderate-intensity exercise (any activity that increases your heart rate and causes you to sweat) each week. In addition, most adults need muscle-strengthening exercises on 2 or more days a week.  Silver Sneakers may be a benefit available to you. To determine eligibility, you may visit the website: www.silversneakers.com or contact program at (430) 841-1791 Mon-Fri between 8AM-8PM.   . Maintain a healthy weight. The body mass index (BMI) is a screening tool to identify possible weight problems. It provides an estimate of body fat based on height and weight. Your health care provider can find your BMI and can help you achieve or maintain a healthy weight.   For adults 20 years and older: ? A BMI below 18.5 is considered underweight. ? A BMI of 18.5 to 24.9 is normal. ? A BMI of 25 to 29.9 is considered overweight. ? A BMI of 30 and above is considered obese.   . Maintain normal blood lipids and cholesterol levels by exercising and minimizing your intake of saturated fat. Eat a balanced diet with plenty of fruit and vegetables. Blood tests for lipids and cholesterol should begin at age 85 and be repeated every 5 years.  If your lipid or cholesterol levels are high, you are over 50, or you are at high risk for heart disease, you may need your cholesterol levels checked more frequently. Ongoing high lipid and cholesterol levels should be treated with medicines if diet and exercise are not working.  . If you smoke, find out from your health care provider how to quit. If you do not use tobacco, please do not start.  . If you choose to drink alcohol, please do not consume more than 2 drinks per day. One drink is considered to be 12 ounces (355 mL) of beer, 5 ounces  (148 mL) of wine, or 1.5 ounces (44 mL) of liquor.  . If you are 85-37 years old, ask your health care provider if you should take aspirin to prevent strokes.  . Use sunscreen. Apply sunscreen liberally and repeatedly throughout the day. You should seek shade when your shadow is shorter than you. Protect yourself by wearing long sleeves, pants, a wide-brimmed hat, and sunglasses year round, whenever you are outdoors.  . Once a month, do a whole body skin exam, using a mirror to look at the skin on your back. Tell your health care provider of new moles, moles that have irregular borders, moles that are larger than a pencil eraser, or moles that have changed in shape or color.

## 2018-02-09 NOTE — Progress Notes (Signed)
Subjective:   Dominic Kane is a 77 y.o. male who presents for Medicare Annual/Subsequent preventive examination.  Review of Systems:  N/A Cardiac Risk Factors include: advanced age (>47men, >68 women);hypertension;dyslipidemia;male gender     Objective:    Vitals: BP 108/80 (BP Location: Right Arm, Patient Position: Sitting, Cuff Size: Normal)   Pulse 60   Temp 97.7 F (36.5 C) (Oral)   Ht 5' 11.25" (1.81 m) Comment: shoes  Wt 209 lb 8 oz (95 kg)   SpO2 95%   BMI 29.01 kg/m   Body mass index is 29.01 kg/m.  Advanced Directives 02/09/2018 01/05/2017 08/05/2016 07/30/2016 03/18/2016 03/18/2016 03/11/2016  Does Patient Have a Medical Advance Directive? Yes Yes - Yes Yes Yes Yes  Type of Advance Directive [place];Living will [place];Living will [place];Living will [place];Living will [place];Living will [place];Living will Living will;Healthcare Power of Attorney  Does patient want to make changes to medical advance directive? - - - - No - Patient declined - No - Patient declined  Copy of [place] in Chart? No - copy requested Yes Yes - No - copy requested - No - copy requested    Tobacco Social History   Tobacco Use  Smoking Status Former Smoker  . Last attempt to quit: 07/03/1980  . Years since quitting: 37.6  Smokeless Tobacco Never Used     Counseling given: No   Clinical Intake:  Pre-visit preparation completed: Yes  Pain : No/denies pain Pain Score: 0-No pain     Nutritional Status: BMI 25 -29 Overweight Nutritional Risks: None Diabetes: Yes CBG done?: No Did pt. bring in CBG monitor from home?: No  How often do you need to have someone help you when you read instructions, pamphlets, or other written materials from your doctor or pharmacy?: 1 - Never What is the last grade level you completed in school?:  Associate degree  Interpreter Needed?: No  Comments: pt lives with spouse Information entered by :: LPinson, LPN  Past Medical History:  Diagnosis Date  . Abnormality of gait   . Acute sinusitis, unspecified   . Allergy   . 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 Aortic valve disorders   . Arthritis    Osteoarthritis-bilateral knees, lower back issues occasionaly related to knee issues  . Benign paroxysmal positional vertigo    not in a long time  . Cataract    resolved  . CHF (congestive heart failure) ([place])   . Coronary atherosclerosis of native coronary artery   . Diabetes mellitus without complication ([place])    Diet control only.  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 Diverticulosis of colon (without mention of hemorrhage)   . Dysfunction of eustachian tube    Hard of hearing"bilateral hearing aids"  . Enlarged prostate   . GERD (gastroesophageal reflux disease)   . Headache(784.0)   . History of kidney stones    past hx. 15 yrs ago x1  . Hypertrophy of prostate without urinary obstruction and other lower urinary tract symptoms (LUTS)   . Lipoprotein deficiencies   . Other and unspecified hyperlipidemia   . Other primary cardiomyopathies   . Pain in joint, lower leg   . Personal history of colonic polyps   . Personal history of other diseases of digestive system   . Pes anserinus tendinitis or bursitis  left shoulder remains an issue  . Sinoatrial node dysfunction (HCC)    [name] follows  . 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 Thoracic or lumbosacral neuritis or radiculitis, unspecified   . Unspecified disorder of skin and subcutaneous tissue   . Unspecified essential hypertension   . Unspecified vitamin D deficiency    Past Surgical History:  Procedure Laterality Date  . BASAL CELL CARCINOMA EXCISION  11/2016   Dr. [place] Crane  . CATARACT EXTRACTION, BILATERAL    . COLONOSCOPY W/ POLYPECTOMY    .  DOBUTAMINE STRESS ECHO  1/07   Lateral hypokinesis but no ischemia  . ESOPHAGOGASTRODUODENOSCOPY  11/07   Schatzki's ring, non bleeding erosive gastropathy [place]  . KNEE SURGERY Left 1947  . LYMPH NODE DISSECTION N/A 08/05/2016   Procedure: LIMITED LYMPH NODE DISSECTION;  Surgeon: Armandina Gemma, MD;  Location: [facility];  Service: General;  Laterality: N/A;  . Pulmonary functioning tests  1. 2003  2. 2005   1. Diminished lung capacity  2. Stable  . THYROIDECTOMY N/A 08/05/2016   Procedure: TOTAL THYROIDECTOMY;  Surgeon: Armandina Gemma, MD;  Location: [facility];  Service: General;  Laterality: N/A;  . TONSILLECTOMY    . TOTAL KNEE ARTHROPLASTY Left 03/18/2016   Procedure: LEFT TOTAL KNEE ARTHROPLASTY;  Surgeon: Paralee Cancel, MD;  Location: [facility];  Service: Orthopedics;  Laterality: Left;   Family History  Problem Relation Age of Onset  . Heart failure Mother   . Coronary artery disease Mother   . Stroke Mother   . Hypothyroidism Mother   . Cancer Father        LUNG  . Cancer Sister        BREAST  . Heart failure Maternal Grandmother   . Hypothyroidism Daughter   . Colon cancer Neg Hx    Social History   Socioeconomic History  . Marital status: Married    Spouse name: Not on file  . Number of children: 1  . Years of education: Not on file  . Highest education level: Not on file  Occupational History  . 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 Financial resource strain: Not on file  . Food insecurity:    Worry: Not on file    Inability: Not on file  . Transportation needs:    Medical: Not on file    Non-medical: Not on file  Tobacco Use  . Smoking status: Former Smoker    Last attempt to quit: 07/03/1980    Years since quitting: 37.6  . Smokeless tobacco: Never Used  Substance and Sexual Activity  . Alcohol use: Yes    Alcohol/week: 0.0 standard drinks    Comment: occassionally  . Drug use: No  . Sexual activity: Not on file    Lifestyle  . Physical activity:    Days per week: Not on file    Minutes per session: Not on file  . Stress: Not on file  Relationships  . Social connections:    Talks on phone: Not on file    Gets together: Not on file    Attends religious service: Not on file    Active member of club or organization: Not on file    Attends meetings of clubs or organizations: Not on file    Relationship status: Not on file  Other Topics Concern  . Not on file  Social History Narrative  . Not on file    Outpatient  Encounter Medications as of 02/09/2018  Medication Sig  . aspirin EC 81 MG tablet Take 81 mg by mouth daily.  . cetirizine (ZYRTEC) 10 MG tablet Take 10 mg by mouth daily.  . finasteride (PROSCAR) 5 MG tablet TAKE 1 TABLET DAILY  . furosemide (LASIX) 40 MG tablet TAKE 1 TABLET DAILY  . glucose blood (ONE TOUCH ULTRA TEST) test strip Use to check blood sugar two times a day. Dx: E11.9  . levothyroxine (SYNTHROID, LEVOTHROID) 150 MCG tablet TAKE 1 TABLET (150 MCG TOTAL) BY MOUTH DAILY BEFORE BREAKFAST.  . Melatonin 5 MG TABS Take 5 mg by mouth at bedtime.   [name] omeprazole (PRILOSEC) 40 MG capsule TAKE 1 CAPSULE DAILY  . ONETOUCH DELICA LANCETS 22W MISC Use to check blood sugar two times a day. Dx: E11.9  . PRESCRIPTION MEDICATION every 4 (four) months. *Antibiotic Injection in Eye*  . ramipril (ALTACE) 2.5 MG capsule TAKE 1 CAPSULE DAILY  . rosuvastatin (CRESTOR) 10 MG tablet TAKE 1 TABLET DAILY  . terazosin (HYTRIN) 10 MG capsule TAKE 1 CAPSULE DAILY   No facility-administered encounter medications on file as of 02/09/2018.     Activities of Daily Living In your present state of health, do you have any difficulty performing the following activities: 02/09/2018  Hearing? Y  Vision? N  Difficulty concentrating or making decisions? N  Walking or climbing stairs? N  Dressing or bathing? N  Doing errands, shopping? N  Preparing Food and eating ? N  Using the Toilet? N  In the past  six months, have you accidently leaked urine? Y  Do you have problems with loss of bowel control? N  Managing your Medications? N  Managing your Finances? N  Housekeeping or managing your Housekeeping? N  Some recent data might be hidden    Patient Care Team: Jinny Sanders, MD as PCP - General Burnell Blanks, MD as PCP - Cardiology (Cardiology)   Assessment:   This is a routine wellness examination for Dominic Kane.  Hearing Screening Comments: Bilateral hearing aids Vision Screening Comments: Vision exam in Oct 2019 with Dr. Zigmund Daniel  Exercise Activities and Dietary recommendations Current Exercise Habits: The patient does not participate in regular exercise at present, Exercise limited by: None identified  Goals    . Health management     Starting 02/09/2018, I will continue to walk 5-6 hours daily, drink 9-10 glasses of water daily, and to take medications as prescribed.        Fall Risk Fall Risk  02/09/2018 01/05/2017 10/19/2015 10/23/2014 10/12/2014  Falls in the past year? 1 No Yes No No  Number falls in past yr: 0 - 2 or more - -  Injury with Fall? 1 - No - -    Depression Screen PHQ 2/9 Scores 02/09/2018 01/05/2017 10/19/2015 10/23/2014  PHQ - 2 Score 0 0 0 1  PHQ- 9 Score 0 0 - -    Cognitive Function MMSE - Mini Mental State Exam 02/09/2018 01/05/2017  Orientation to time 5 5  Orientation to Place 5 5  Registration 3 3  Attention/ Calculation 0 0  Recall 3 3  Language- name 2 objects 0 0  Language- repeat 1 1  Language- follow 3 step command 3 3  Language- read & follow direction 0 0  Write a sentence 0 0  Copy design 0 0  Total score 20 20        Immunization History  Administered Date(s) Administered  . Influenza Whole 12/23/2006, 12/09/2007,  12/22/2008  . Influenza, High Dose Seasonal PF 11/17/2014, 12/12/2015, 12/18/2016, 12/22/2017  . Influenza,inj,Quad PF,6+ Mos 01/06/2013  . Influenza-Unspecified 11/22/2013  . Pneumococcal Conjugate-13  10/12/2014  . Pneumococcal Polysaccharide-23 12/23/2006  . Td 03/24/2001  . Tdap 11/17/2014  . Zoster 06/23/2014    Screening Tests Health Maintenance  Topic Date Due  . FOOT EXAM  02/12/2018 (Originally 06/10/2017)  . OPHTHALMOLOGY EXAM  04/20/2018  . HEMOGLOBIN A1C  08/10/2018  . COLONOSCOPY  05/02/2019  . TETANUS/TDAP  11/16/2024  . INFLUENZA VACCINE  Completed  . PNA vac Low Risk Adult  Completed       Plan:   I have personally reviewed, addressed, and noted the following in the patient's chart:  A. Medical and social history B. Use of alcohol, tobacco or illicit drugs  C. Current medications and supplements D. Functional ability and status E.  Nutritional status F.  Physical activity G. Advance directives H. List of other physicians I.  Hospitalizations, surgeries, and ER visits in previous 12 months J.  [place] to include hearing, vision, cognitive, depression L. Referrals and appointments - none  In addition, I have reviewed and discussed with patient certain preventive protocols, quality metrics, and best practice recommendations. A written personalized care plan for preventive services as well as general preventive health recommendations were provided to patient.  See attached scanned questionnaire for additional information.   Signed,   Lindell Noe, MHA, BS, LPN Health Coach

## 2018-02-09 NOTE — Progress Notes (Signed)
PCP notes:   Health maintenance:  Foot exam - PCP follow-up requested A1C - completed  Abnormal screenings:   Fall risk - hx of single fall Fall Risk  02/09/2018 01/05/2017 10/19/2015 10/23/2014 10/12/2014  Falls in the past year? 1 No Yes No No  Number falls in past yr: 0 - 2 or more - -  Injury with Fall? 1 - No - -   Patient concerns:   None  Nurse concerns:  None  Next PCP appt:   02/12/18 @ 1530

## 2018-02-10 ENCOUNTER — Other Ambulatory Visit: Payer: Self-pay

## 2018-02-12 ENCOUNTER — Encounter: Payer: Self-pay | Admitting: Family Medicine

## 2018-02-12 ENCOUNTER — Ambulatory Visit (INDEPENDENT_AMBULATORY_CARE_PROVIDER_SITE_OTHER): Payer: Medicare Other | Admitting: Family Medicine

## 2018-02-12 VITALS — BP 100/64 | HR 60 | Temp 98.5°F | Ht 71.25 in | Wt 210.5 lb

## 2018-02-12 DIAGNOSIS — C73 Malignant neoplasm of thyroid gland: Secondary | ICD-10-CM | POA: Diagnosis not present

## 2018-02-12 DIAGNOSIS — I509 Heart failure, unspecified: Secondary | ICD-10-CM

## 2018-02-12 DIAGNOSIS — E890 Postprocedural hypothyroidism: Secondary | ICD-10-CM | POA: Diagnosis not present

## 2018-02-12 DIAGNOSIS — E11319 Type 2 diabetes mellitus with unspecified diabetic retinopathy without macular edema: Secondary | ICD-10-CM | POA: Diagnosis not present

## 2018-02-12 DIAGNOSIS — E1340 Other specified diabetes mellitus with diabetic neuropathy, unspecified: Secondary | ICD-10-CM | POA: Diagnosis not present

## 2018-02-12 DIAGNOSIS — I2510 Atherosclerotic heart disease of native coronary artery without angina pectoris: Secondary | ICD-10-CM

## 2018-02-12 DIAGNOSIS — E7800 Pure hypercholesterolemia, unspecified: Secondary | ICD-10-CM

## 2018-02-12 DIAGNOSIS — I10 Essential (primary) hypertension: Secondary | ICD-10-CM | POA: Diagnosis not present

## 2018-02-12 LAB — HM DIABETES FOOT EXAM

## 2018-02-12 NOTE — Assessment & Plan Note (Signed)
LIkely causing tripping issues. No pain.. Will try to get tighter sugar control.

## 2018-02-12 NOTE — Assessment & Plan Note (Addendum)
Follwowed by cards, euvolemic

## 2018-02-12 NOTE — Assessment & Plan Note (Signed)
Followed by ENDO 

## 2018-02-12 NOTE — Patient Instructions (Addendum)
Work on low [employer], increase exercsie as tolerated.  Start vit D 3 OTC 400 IU daily.  Can try a trial off hytrin to see if BP comes up some and urinary symptoms are improved at all.

## 2018-02-12 NOTE — Assessment & Plan Note (Signed)
Well controlled. Continue current medication.  

## 2018-02-12 NOTE — Progress Notes (Signed)
Subjective:    Patient ID: Dominic Kane, male    DOB: 07-16-1940, 77 y.o.   MRN: [id number]  HPI  The patient presents for complete physical and review of chronic health problems. He/She also has the following acute concerns today: he has noted his balance is off  In last year... Moving backward trip more often.  no numbness in feet. No vertigo,  If bending real quick lightheadedness but try avoid.  The patient saw Candis Musa, LPN for medicare wellness. Note reviewed in detail and important notes copied below. Health maintenance:  Foot exam - PCP follow-up requested A1C - completed  Abnormal screenings:   Fall risk - hx of single fall Fall Risk  02/09/2018 01/05/2017 10/19/2015 10/23/2014 10/12/2014  Falls in the past year? 1 No Yes No No  Number falls in past yr: 0 - 2 or more - -  Injury with Fall? 1 - No - -   02/12/18  Today:  Hypertension:   BP at goal on ramipril  BP Readings from Last 3 Encounters:  02/12/18 100/64  02/09/18 108/80  12/22/17 130/80  Using medication without problems or lightheadedness:  Chest pain with exertion: Edema: Short of breath: Average home BPs: Other issues: HX of CAD, aortic insufficiency, thoracic aneurysm, Cardiomyopathy, CHF EF 44% Followed by Dr. Glennie Hawk CHF, cardiomyopathy: euvomemic  Diabetes:   Good control on diet control Lab Results  Component Value Date   HGBA1C 6.6 (H) 02/09/2018  Using medications without difficulties: Hypoglycemic episodes: Hyperglycemic episodes: Feet problems: bruised under toenail. Blood Sugars averaging: fbs 100-130 eye exam within last year: 03/2017  Hypothyroid  Lab Results  Component Value Date   TSH 1.15 12/22/2017     Elevated Cholesterol:  Good control on crestor Lab Results  Component Value Date   CHOL 112 02/09/2018   HDL 40.40 02/09/2018   LDLCALC 59 02/09/2018   TRIG 60.0 02/09/2018   CHOLHDL 3 02/09/2018  Using medications without problems: Muscle aches:    Diet compliance: moderate Exercise: at work Other complaints:   Social History /Family History/Past Medical History reviewed in detail and updated in EMR if needed. Blood pressure 100/64, pulse 60, temperature 98.5 F (36.9 C), temperature source Oral, height 5' 11.25" (1.81 m), weight 210 lb 8 oz (95.5 kg).  Review of Systems  Constitutional: Negative for fatigue and fever.  HENT: Negative for ear pain.   Eyes: Negative for pain.  Respiratory: Negative for cough and shortness of breath.   Cardiovascular: Negative for chest pain, palpitations and leg swelling.  Gastrointestinal: Negative for abdominal pain.  Genitourinary: Negative for dysuria.  Musculoskeletal: Positive for gait problem. Negative for arthralgias.  Neurological: Positive for dizziness. Negative for syncope, light-headedness and headaches.  Psychiatric/Behavioral: Negative for dysphoric mood.       Objective:   Physical Exam Constitutional:      General: He is not in acute distress.    Appearance: Normal appearance. He is well-developed. He is not ill-appearing or toxic-appearing.  HENT:     Head: Normocephalic and atraumatic.     Right Ear: Hearing, tympanic membrane, ear canal and external ear normal.     Left Ear: Hearing, tympanic membrane, ear canal and external ear normal.     Nose: Nose normal.     Mouth/Throat:     Pharynx: Uvula midline.  Eyes:     General: Lids are normal. Lids are everted, no foreign bodies appreciated.     Conjunctiva/sclera: Conjunctivae normal.     Pupils: Pupils  are equal, round, and reactive to light.  Neck:     Musculoskeletal: Normal range of motion and neck supple.     Thyroid: No thyroid mass or thyromegaly.     Vascular: No carotid bruit.     Trachea: Trachea and phonation normal.  Cardiovascular:     Rate and Rhythm: Normal rate and regular rhythm.     Pulses: Normal pulses.     Heart sounds: S1 normal and S2 normal. No murmur. No gallop.   Pulmonary:     Breath  sounds: Normal breath sounds. No wheezing, rhonchi or rales.  Abdominal:     General: Bowel sounds are normal.     Palpations: Abdomen is soft.     Tenderness: There is no abdominal tenderness. There is no guarding or rebound.     Hernia: No hernia is present.  Lymphadenopathy:     Cervical: No cervical adenopathy.  Skin:    General: Skin is warm and dry.     Findings: No rash.  Neurological:     Mental Status: He is alert.     Cranial Nerves: No cranial nerve deficit.     Sensory: No sensory deficit.     Motor: Motor function is intact.     Coordination: Coordination is intact. Romberg sign negative. Coordination normal. Finger-Nose-Finger Test and Heel to [facility] Test normal.     Gait: Gait normal.     Deep Tendon Reflexes: Reflexes are normal and symmetric.  Psychiatric:        Speech: Speech normal.        Behavior: Behavior normal.        Judgment: Judgment normal.       Diabetic foot exam: Normal inspection No skin breakdown No calluses  Normal DP pulses Decreased sensation to light touch and monofilament Nails normal except off and on bruise under both great t     Assessment & Plan:  The patient's preventative maintenance and recommended screening tests for an annual wellness exam were reviewed in full today. Brought up to date unless services declined.  Counselled on the importance of diet, exercise, and its role in overall health and mortality. The patient's FH and SH was reviewed, including their home life, tobacco status, and drug and alcohol status.   Vaccines:Uptodate  Colon:2016 3 adenomas, Dr. Carlean Purl, repeat in 5 years . Former smoker 20 pipe, quit 35 years ago. Prostate eval not indicated.

## 2018-02-12 NOTE — Assessment & Plan Note (Signed)
Diet controlled.  

## 2018-02-15 MED ORDER — GLUCOSE BLOOD VI STRP: ORAL_STRIP | 3 refills | Status: DC

## 2018-02-15 MED ORDER — ONETOUCH DELICA LANCETS 33G MISC: 3 refills | Status: DC

## 2018-02-15 MED ORDER — ONETOUCH ULTRA 2 W/DEVICE KIT: PACK | 0 refills | Status: DC

## 2018-03-02 ENCOUNTER — Encounter: Payer: Self-pay | Admitting: Podiatry

## 2018-03-02 ENCOUNTER — Ambulatory Visit (INDEPENDENT_AMBULATORY_CARE_PROVIDER_SITE_OTHER): Payer: Medicare Other | Admitting: Podiatry

## 2018-03-02 DIAGNOSIS — B351 Tinea unguium: Secondary | ICD-10-CM | POA: Diagnosis not present

## 2018-03-02 DIAGNOSIS — E0843 Diabetes mellitus due to underlying condition with diabetic autonomic (poly)neuropathy: Secondary | ICD-10-CM

## 2018-03-02 DIAGNOSIS — L989 Disorder of the skin and subcutaneous tissue, unspecified: Secondary | ICD-10-CM

## 2018-03-02 DIAGNOSIS — M79676 Pain in unspecified toe(s): Secondary | ICD-10-CM | POA: Diagnosis not present

## 2018-03-03 NOTE — Progress Notes (Signed)
Subjective: Patient is a 78 y.o. male presenting to the office today as a new patient with a chief complaint of painful callus lesions to the bilateral feet that have been present for the past few months. Walking and bearing weight increases the pain. He has not done anything for treatment.   Patient also complains of elongated, thickened nails that cause pain while ambulating in shoes. He is unable to trim his own nails. Patient presents today for further treatment and evaluation.  Past Medical History:  Diagnosis Date  . Abnormality of gait   . Acute sinusitis, unspecified   . Allergy   . 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 Aortic valve disorders   . Arthritis    Osteoarthritis-bilateral knees, lower back issues occasionaly related to knee issues  . Benign paroxysmal positional vertigo    not in a long time  . Cataract    resolved  . CHF (congestive heart failure) ([place])   . Coronary atherosclerosis of native coronary artery   . Diabetes mellitus without complication ([place])    Diet control only.  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 Diverticulosis of colon (without mention of hemorrhage)   . Dysfunction of eustachian tube    Hard of hearing"bilateral hearing aids"  . Enlarged prostate   . GERD (gastroesophageal reflux disease)   . Headache(784.0)   . History of kidney stones    past hx. 15 yrs ago x1  . Hypertrophy of prostate without urinary obstruction and other lower urinary tract symptoms (LUTS)   . Lipoprotein deficiencies   . Other and unspecified hyperlipidemia   . Other primary cardiomyopathies   . Pain in joint, lower leg   . Personal history of colonic polyps   . Personal history of other diseases of digestive system   . Pes anserinus tendinitis or bursitis    left shoulder remains an issue  . Sinoatrial node dysfunction (HCC)    [name] follows  . 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  Thoracic or lumbosacral neuritis or radiculitis, unspecified   . Unspecified disorder of skin and subcutaneous tissue   . Unspecified essential hypertension   . Unspecified vitamin D deficiency     Objective:  Physical Exam General: Alert and oriented x3 in no acute distress  Dermatology: Hyperkeratotic lesions present on the bilateral feet x 4. Pain on palpation with a central nucleated core noted. Skin is warm, dry and supple bilateral lower extremities. Negative for open lesions or macerations. Nails are tender, long, thickened and dystrophic with subungual debris, consistent with onychomycosis, 1-5 bilateral. No signs of infection noted.  Vascular: Palpable pedal pulses bilaterally. No edema or erythema noted. Capillary refill within normal limits.  Neurological: Epicritic and protective threshold diminished bilaterally.   Musculoskeletal Exam: Pain on palpation at the keratotic lesion noted. Range of motion within normal limits bilateral. Muscle strength 5/5 in all groups bilateral.  Assessment: 1. Onychodystrophic nails 1-5 bilateral with hyperkeratosis of nails.  2. 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 Pre-ulcerative calluses noted to the bilateral feet x 4   Plan of Care:  1. Patient evaluated. 2. Excisional debridement of keratoic lesion using a chisel blade was performed without incident.  3. Dressed with light dressing. 4. Mechanical debridement of nails 1-5 bilaterally performed using a nail nipper. Filed with dremel without incident.  5. Patient is to return to the clinic in 3 months.   Ruby Cola  Carver Fila, DPM [facility]  Dr[address] Edrick Kins, [address]                                        [place], [place] [address]                Office [phone]  Fax [phone]

## 2018-03-16 NOTE — Assessment & Plan Note (Signed)
Good control on crestor

## 2018-03-16 NOTE — Assessment & Plan Note (Signed)
Followed by opthalmology.. eye exam in last year.

## 2018-03-19 ENCOUNTER — Other Ambulatory Visit: Payer: Self-pay | Admitting: *Deleted

## 2018-03-19 MED ORDER — TERAZOSIN HCL 10 MG PO CAPS: 10.0000 mg | ORAL_CAPSULE | Freq: Every day | ORAL | 0 refills | Status: DC

## 2018-03-19 NOTE — Telephone Encounter (Signed)
Pt is out of town and forgot medication. Requesting 7day supply sent locally. Advised he may have to pay out of pocket.

## 2018-03-20 ENCOUNTER — Other Ambulatory Visit: Payer: Self-pay | Admitting: Family Medicine

## 2018-03-20 ENCOUNTER — Telehealth: Payer: Self-pay | Admitting: Family Medicine

## 2018-03-20 MED ORDER — FINASTERIDE 5 MG PO TABS: 5.0000 mg | ORAL_TABLET | Freq: Every day | ORAL | 0 refills | Status: DC

## 2018-03-20 NOTE — Telephone Encounter (Addendum)
Noted. Appears that his terazozin was already sent to the pharmacy in [place], will send his finasteride now. FYI to PCP.

## 2018-03-20 NOTE — Telephone Encounter (Signed)
Pt is in [place] for vacation and left his  finasteride (PROSCAR) 5 MG tablet terazosin (HYTRIN) 10 MG capsule At home,   Please send refill to [facility] [place]

## 2018-03-20 NOTE — Addendum Note (Signed)
Addended by: Pleas Koch on: 03/20/2018 01:18 PM   Modules accepted: Orders

## 2018-04-09 ENCOUNTER — Ambulatory Visit (INDEPENDENT_AMBULATORY_CARE_PROVIDER_SITE_OTHER): Payer: Medicare Other | Admitting: Primary Care

## 2018-04-09 ENCOUNTER — Encounter: Payer: Self-pay | Admitting: Primary Care

## 2018-04-09 VITALS — BP 120/76 | HR 63 | Temp 98.0°F | Ht 71.25 in | Wt 209.5 lb

## 2018-04-09 DIAGNOSIS — B9789 Other viral agents as the cause of diseases classified elsewhere: Secondary | ICD-10-CM

## 2018-04-09 DIAGNOSIS — J329 Chronic sinusitis, unspecified: Secondary | ICD-10-CM | POA: Diagnosis not present

## 2018-04-09 NOTE — Patient Instructions (Signed)
Try taking the acetaminophen 500 mg in the morning and afternoon, and 1000 mg at bedtime.  Use the Flonase before bedtime.  Try using a saline nasal spray during the day.  Continue taking Zyrtec.  Please notify me if you develop persistent fevers of 101 and/or feel worse after 1 week of onset of symptoms.   Increase consumption of water intake and rest.  It was a pleasure meeting you!

## 2018-04-09 NOTE — Progress Notes (Signed)
Subjective:    Patient ID: Dominic Kane, male    DOB: February 19, 1941, 78 y.o.   MRN: [id number]  HPI  Dominic Kane is a 78 year old male with a history of CHF, CAD, hypertension, type 2 diabetes, hypothyroidism who presents today with a chief complaint of sinus pressure.  His sinus pressure began two days ago. He's also noticed some green/yellow discharge from his nasal cavity, also with some post nasal drip. His pressure is worse when laying down at night. He's been taking Tylenol and Flonase with some temporary improvement. He denies fevers, cough, otalgia, dental pain.  Review of Systems  Constitutional: Negative for chills, fatigue and fever.  HENT: Positive for congestion, postnasal drip and sinus pressure. Negative for ear pain.        Past Medical History:  Diagnosis Date  . Abnormality of gait   . Acute sinusitis, unspecified   . Allergy   . 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 Aortic valve disorders   . Arthritis    Osteoarthritis-bilateral knees, lower back issues occasionaly related to knee issues  . Benign paroxysmal positional vertigo    not in a long time  . Cataract    resolved  . CHF (congestive heart failure) ([place])   . Coronary atherosclerosis of native coronary artery   . Diabetes mellitus without complication ([place])    Diet control only.  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 Diverticulosis of colon (without mention of hemorrhage)   . Dysfunction of eustachian tube    Hard of hearing"bilateral hearing aids"  . Enlarged prostate   . GERD (gastroesophageal reflux disease)   . Headache(784.0)   . History of kidney stones    past hx. 15 yrs ago x1  . Hypertrophy of prostate without urinary obstruction and other lower urinary tract symptoms (LUTS)   . Lipoprotein deficiencies   . Other and unspecified hyperlipidemia   . Other primary cardiomyopathies   . Pain in joint, lower leg   . Personal history of  colonic polyps   . Personal history of other diseases of digestive system   . Pes anserinus tendinitis or bursitis    left shoulder remains an issue  . Sinoatrial node dysfunction (HCC)    [name] follows  . 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 Thoracic or lumbosacral neuritis or radiculitis, unspecified   . Unspecified disorder of skin and subcutaneous tissue   . Unspecified essential hypertension   . Unspecified vitamin D deficiency      Social History   Socioeconomic History  . Marital status: Married    Spouse name: Not on file  . Number of children: 1  . Years of education: Not on file  . Highest education level: Not on file  Occupational History  . 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 Financial resource strain: Not on file  . Food insecurity:    Worry: Not on file    Inability: Not on file  . Transportation needs:    Medical: Not on file    Non-medical: Not on file  Tobacco Use  . Smoking status: Former Smoker    Last attempt to quit: 07/03/1980    Years since quitting: 37.7  . Smokeless tobacco: Never Used  Substance and Sexual Activity  . Alcohol use: Yes    Alcohol/week: 0.0 standard drinks    Comment: occassionally  . Drug  use: No  . Sexual activity: Not on file  Lifestyle  . Physical activity:    Days per week: Not on file    Minutes per session: Not on file  . Stress: Not on file  Relationships  . Social connections:    Talks on phone: Not on file    Gets together: Not on file    Attends religious service: Not on file    Active member of club or organization: Not on file    Attends meetings of clubs or organizations: Not on file    Relationship status: Not on file  . Intimate partner violence:    Fear of current or ex partner: Not on file    Emotionally abused: Not on file    Physically abused: Not on file    Forced sexual activity: Not on file  Other Topics Concern  . Not on file  Social  History Narrative  . Not on file    Past Surgical History:  Procedure Laterality Date  . BASAL CELL CARCINOMA EXCISION  11/2016   Dr. [place] Crane  . CATARACT EXTRACTION, BILATERAL    . COLONOSCOPY W/ POLYPECTOMY    . DOBUTAMINE STRESS ECHO  1/07   Lateral hypokinesis but no ischemia  . ESOPHAGOGASTRODUODENOSCOPY  11/07   Schatzki's ring, non bleeding erosive gastropathy [place]  . KNEE SURGERY Left 1947  . LYMPH NODE DISSECTION N/A 08/05/2016   Procedure: LIMITED LYMPH NODE DISSECTION;  Surgeon: Armandina Gemma, MD;  Location: [facility];  Service: General;  Laterality: N/A;  . Pulmonary functioning tests  1. 2003  2. 2005   1. Diminished lung capacity  2. Stable  . THYROIDECTOMY N/A 08/05/2016   Procedure: TOTAL THYROIDECTOMY;  Surgeon: Armandina Gemma, MD;  Location: [facility];  Service: General;  Laterality: N/A;  . TONSILLECTOMY    . TOTAL KNEE ARTHROPLASTY Left 03/18/2016   Procedure: LEFT TOTAL KNEE ARTHROPLASTY;  Surgeon: Paralee Cancel, MD;  Location: [facility];  Service: Orthopedics;  Laterality: Left;    Family History  Problem Relation Age of Onset  . Heart failure Mother   . Coronary artery disease Mother   . Stroke Mother   . Hypothyroidism Mother   . Cancer Father        LUNG  . Cancer Sister        BREAST  . Heart failure Maternal Grandmother   . Hypothyroidism Daughter   . Colon cancer Neg Hx     Allergies  Allergen Reactions  . Codeine Nausea Only  . Sulfonamide Derivatives Hives    Current Outpatient Medications on File Prior to Visit  Medication Sig Dispense Refill  . aspirin EC 81 MG tablet Take 81 mg by mouth daily.    . Blood Glucose Monitoring Suppl (ONE TOUCH ULTRA 2) w/Device KIT Use to check blood sugar daily. 1 each 0  . cetirizine (ZYRTEC) 10 MG tablet Take 10 mg by mouth daily.    . Cholecalciferol (D3 ADULT PO) Take by mouth.    . finasteride (PROSCAR) 5 MG tablet Take 1 tablet (5 mg total) by mouth daily. 7 tablet 0  . furosemide (LASIX) 40 MG tablet  TAKE 1 TABLET DAILY 90 tablet 3  . glucose blood (ONE TOUCH ULTRA TEST) test strip Use to check blood sugar two times a day. Dx: E11.9 (Patient taking differently: Use to check blood sugar every other day) 200 each 3  . levothyroxine (SYNTHROID, LEVOTHROID) 150 MCG tablet TAKE 1 TABLET (150 MCG TOTAL) BY MOUTH  DAILY BEFORE BREAKFAST. 90 tablet 3  . Melatonin 5 MG TABS Take 5 mg by mouth at bedtime.     [name] omeprazole (PRILOSEC) 40 MG capsule TAKE 1 CAPSULE DAILY 90 capsule 1  . ONETOUCH DELICA LANCETS 49N MISC Use to check blood sugar two times a day. Dx: E11.9 200 each 3  . PRESCRIPTION MEDICATION every 4 (four) months. *Antibiotic Injection in Eye*    . ramipril (ALTACE) 2.5 MG capsule TAKE 1 CAPSULE DAILY 90 capsule 3  . rosuvastatin (CRESTOR) 10 MG tablet TAKE 1 TABLET DAILY 90 tablet 3  . terazosin (HYTRIN) 10 MG capsule TAKE 1 CAPSULE DAILY 90 capsule 1   No current facility-administered medications on file prior to visit.     BP 120/76   Pulse 63   Temp 98 F (36.7 C) (Oral)   Ht 5' 11.25" (1.81 m)   Wt 209 lb 8 oz (95 kg)   SpO2 95%   BMI 29.01 kg/m    Objective:   Physical Exam  Constitutional: He appears well-nourished. He does not appear ill.  HENT:  Right Ear: Tympanic membrane and ear canal normal.  Left Ear: Tympanic membrane and ear canal normal.  Nose: Mucosal edema present. Right sinus exhibits maxillary sinus tenderness. Right sinus exhibits no frontal sinus tenderness. Left sinus exhibits maxillary sinus tenderness. Left sinus exhibits no frontal sinus tenderness.  Mouth/Throat: Oropharynx is clear and moist.  Neck: Neck supple.  Cardiovascular: Normal rate and regular rhythm.  Respiratory: Effort normal and breath sounds normal. He has no wheezes.  Skin: Skin is warm and dry.           Assessment & Plan:  Viral Sinusitis:  Sinus pressure, postnasal drip x2 days. Exam today with tenderness to bilateral maxillary sinuses, otherwise unremarkable.  He is  stable, does not appear sickly. Do suspect early viral sinusitis, will treat with conservative measures. We will maximize Tylenol, instructions provided.  Continue Flonase and Zyrtec. Discussed return precautions.  Pleas Koch, NP

## 2018-04-14 DIAGNOSIS — X32XXXD Exposure to sunlight, subsequent encounter: Secondary | ICD-10-CM | POA: Diagnosis not present

## 2018-04-14 DIAGNOSIS — D485 Neoplasm of uncertain behavior of skin: Secondary | ICD-10-CM | POA: Diagnosis not present

## 2018-04-14 DIAGNOSIS — D225 Melanocytic nevi of trunk: Secondary | ICD-10-CM | POA: Diagnosis not present

## 2018-04-14 DIAGNOSIS — L570 Actinic keratosis: Secondary | ICD-10-CM | POA: Diagnosis not present

## 2018-04-14 IMAGING — CT CT ANGIO CHEST
2 of 8 series · 18 of 46 positions shown · IV contrast (isovue)
Comparison: MRI 02/22/2013

CLINICAL DATA: Thoracic aortic aneurysm

EXAM:
CT ANGIOGRAPHY CHEST WITH CONTRAST
TECHNIQUE: Multidetector CT imaging of the chest was performed using the
standard protocol during bolus administration of intravenous
contrast. Multiplanar CT image reconstructions and MIPs were
obtained to evaluate the vascular anatomy.
CONTRAST:  100 mL Isovue 370 intravenous

[Series 5: aorta 3.0 i31f 2 · axial · 0.72mm/px · z∈[-318,-48]mm · 15 of 98 slices shown]
[im 4/98  lung]
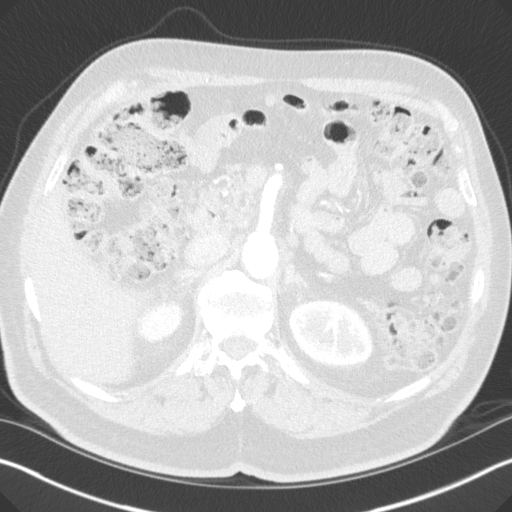
[im 12/98  soft-tissue]
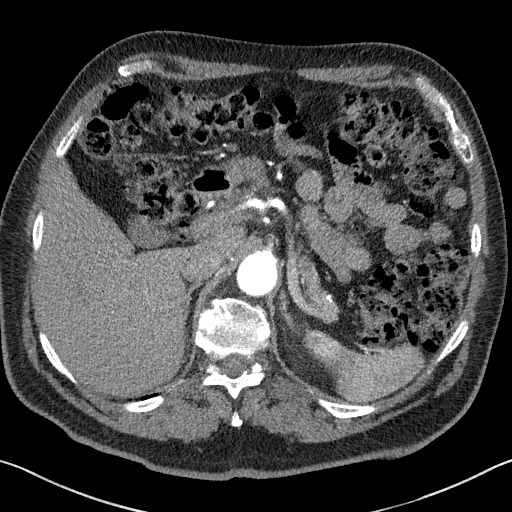
[im 19/98  lung]
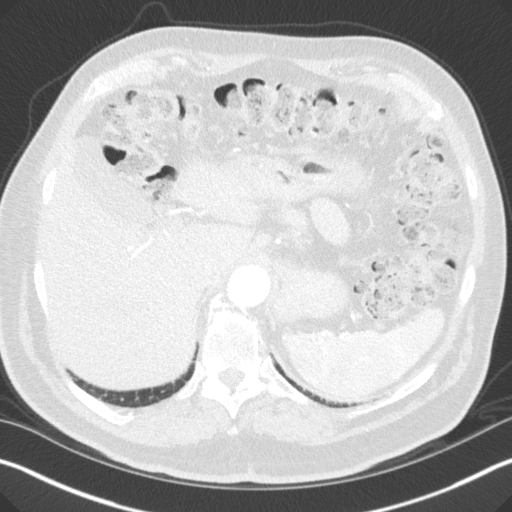
[im 23/98  soft-tissue]
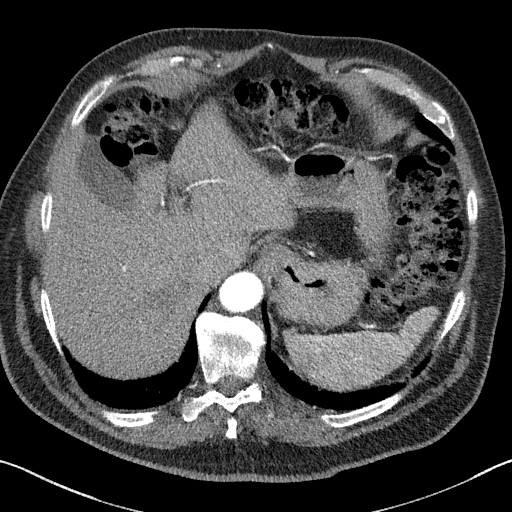
[im 30/98  lung]
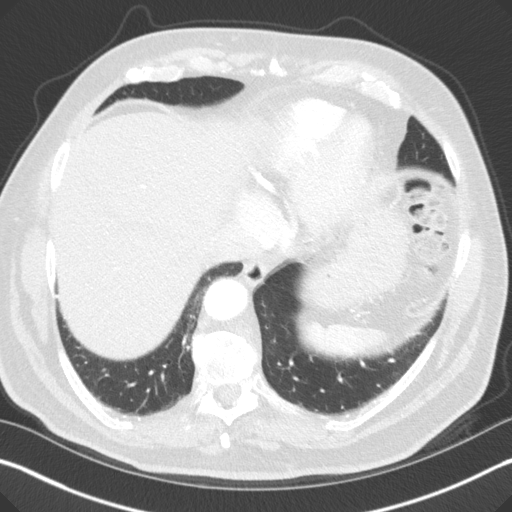
[im 38/98  soft-tissue]
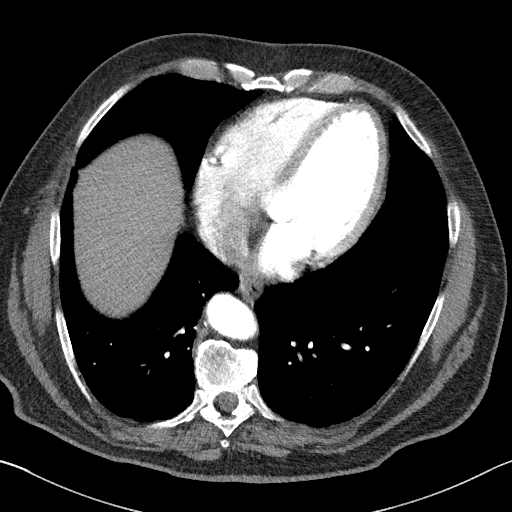
[im 42/98  lung]
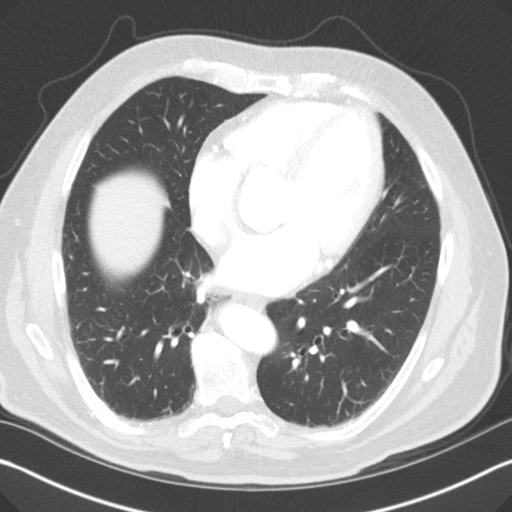
[im 49/98  soft-tissue]
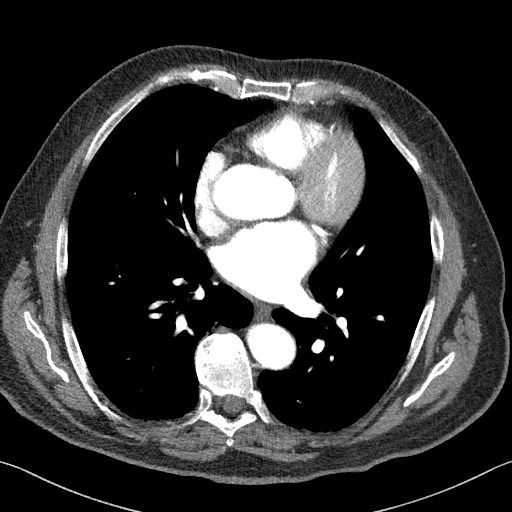
[im 56/98  lung]
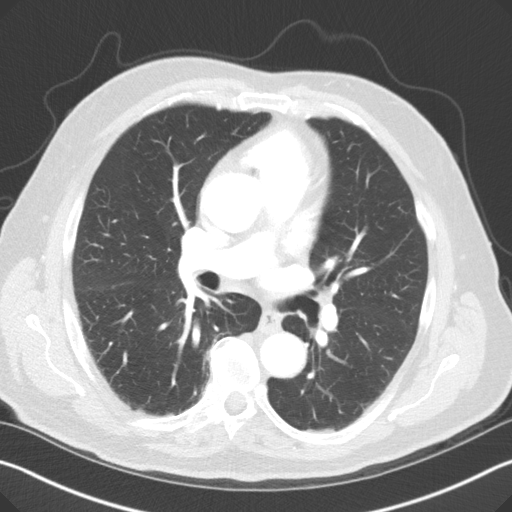
[im 60/98  soft-tissue]
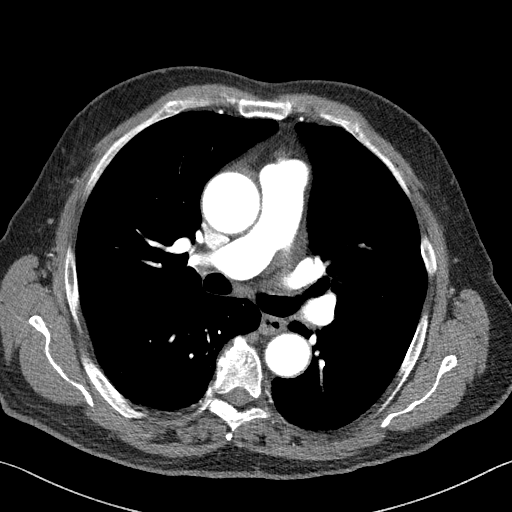
[im 68/98  lung]
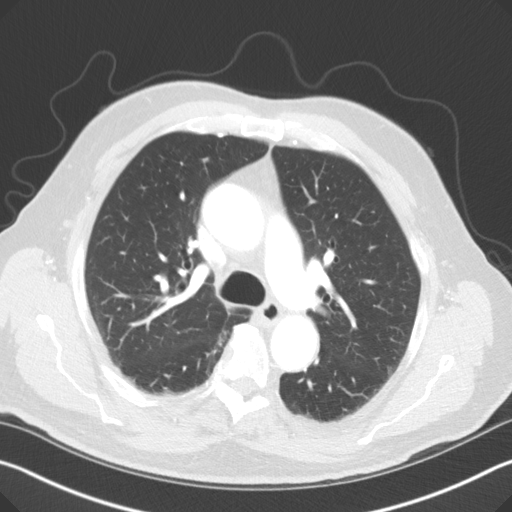
[im 75/98  soft-tissue]
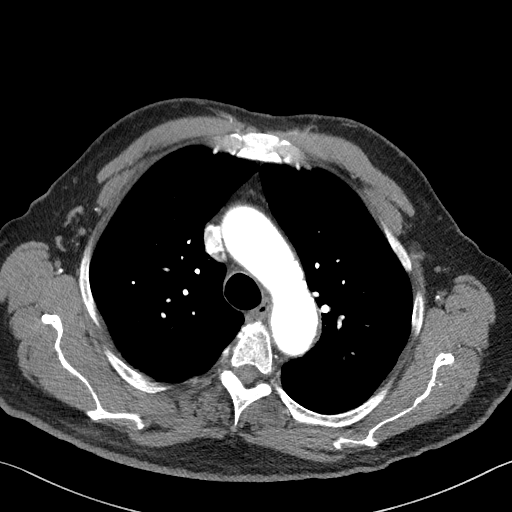
[im 79/98  lung]
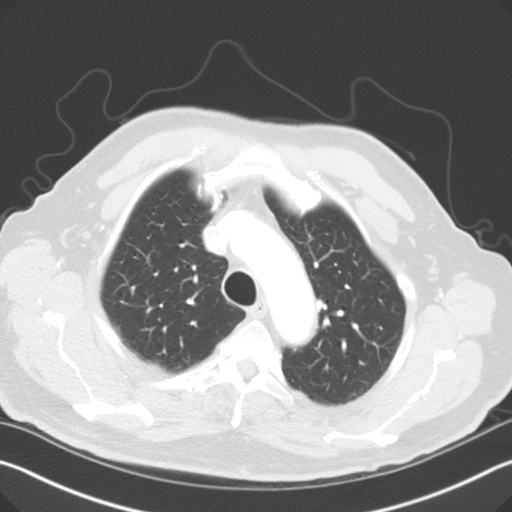
[im 86/98  soft-tissue]
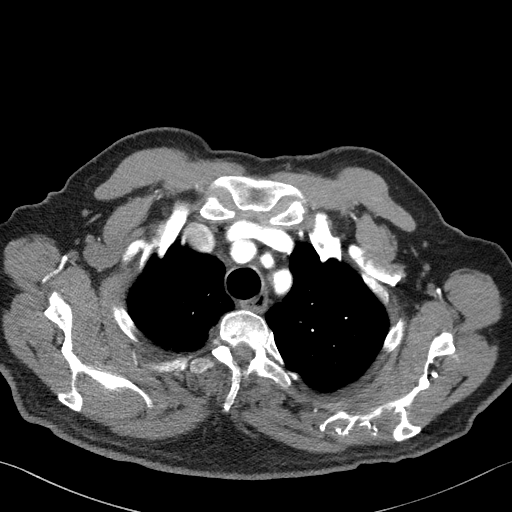
[im 94/98  lung]
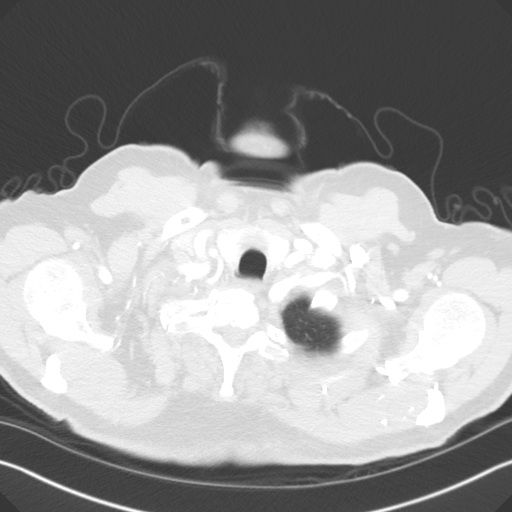

[Series 8: coronals · coronal · 0.62mm/px · 3 of 151 slices shown]
[im 38/151  soft-tissue]
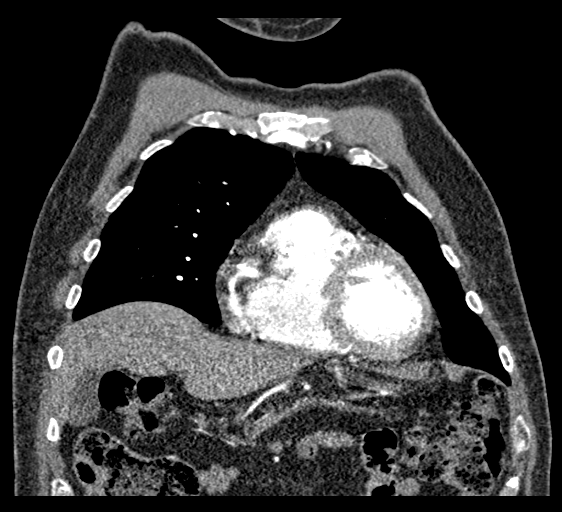
[im 76/151  soft-tissue]
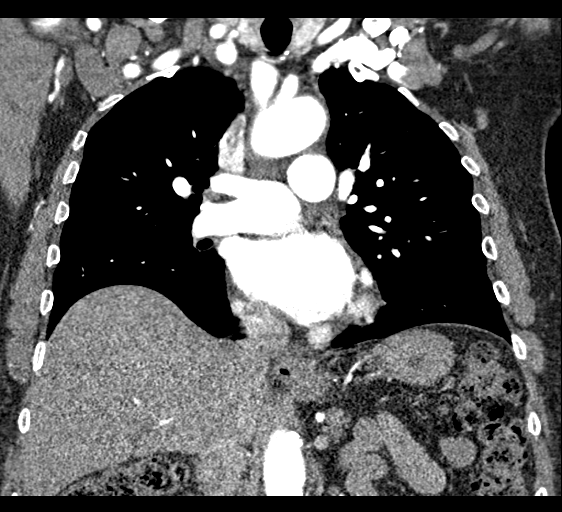
[im 113/151  soft-tissue]
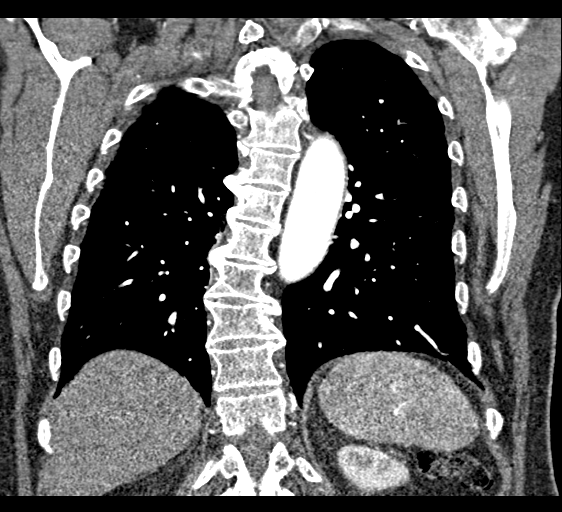

[18 of 46 positions shown; findings below may reference images not displayed]

FINDINGS: Cardiovascular: Mild aneurysmal dilatation of the ascending aorta,
measuring 4.2 cm in maximum diameter. No dissection. Coronary artery
calcifications. Borderline cardiomegaly. No pericardial effusion.

There is atherosclerotic vascular calcification of the aorta. Arch
diameter of 3.3 cm. Descending thoracic aorta diameter 3.1 cm
maximum.

Mediastinum/Nodes: 1.7 cm hypodense nodule at the lower isthmus of
the thyroid gland. Midline trachea. No grossly enlarged mediastinal
or hilar nodes. Esophagus within normal limits

Lungs/Pleura: Lungs are clear. No pleural effusion or pneumothorax.

Upper Abdomen: No acute abnormality.

Musculoskeletal: Scoliosis of the spine with degenerative changes.
No acute or suspicious bone lesion.

Review of the MIP images confirms the above findings.
IMPRESSION: Mild aneurysmal dilatation of the ascending aorta measuring 4.2 cm
in maximum diameter. Recommend annual imaging followup by CTA or
MRA. This recommendation follows 1696
ACCF/AHA/AATS/ACR/ASA/SCA/GAYAN/MBIPLU/EDMON/RANTONA Guidelines for the
Diagnosis and Management of Patients with Thoracic Aortic Disease.
Circulation. 1696; 121: [id number]

Aortic atherosclerosis.  Coronary artery calcifications.

1.7 cm hypodense nodule at the isthmus of the thyroid gland. Thyroid
ultrasound may be performed as clinically indicated.

## 2018-05-03 ENCOUNTER — Encounter (INDEPENDENT_AMBULATORY_CARE_PROVIDER_SITE_OTHER): Payer: Medicare Other | Admitting: Ophthalmology

## 2018-05-03 DIAGNOSIS — I10 Essential (primary) hypertension: Secondary | ICD-10-CM

## 2018-05-03 DIAGNOSIS — H43813 Vitreous degeneration, bilateral: Secondary | ICD-10-CM

## 2018-05-03 DIAGNOSIS — H34811 Central retinal vein occlusion, right eye, with macular edema: Secondary | ICD-10-CM | POA: Diagnosis not present

## 2018-05-03 DIAGNOSIS — H35372 Puckering of macula, left eye: Secondary | ICD-10-CM

## 2018-05-03 DIAGNOSIS — H353112 Nonexudative age-related macular degeneration, right eye, intermediate dry stage: Secondary | ICD-10-CM | POA: Diagnosis not present

## 2018-05-03 DIAGNOSIS — H35342 Macular cyst, hole, or pseudohole, left eye: Secondary | ICD-10-CM | POA: Diagnosis not present

## 2018-05-03 DIAGNOSIS — H35033 Hypertensive retinopathy, bilateral: Secondary | ICD-10-CM

## 2018-05-03 LAB — HM DIABETES EYE EXAM

## 2018-05-04 ENCOUNTER — Other Ambulatory Visit: Payer: Self-pay | Admitting: *Deleted

## 2018-05-04 MED ORDER — FINASTERIDE 5 MG PO TABS: 5.0000 mg | ORAL_TABLET | Freq: Every day | ORAL | 1 refills | Status: DC

## 2018-05-04 NOTE — Telephone Encounter (Signed)
Received faxed refill request from [facility] for refill on Finasteride. Refill sent to pharmacy electronically.

## 2018-05-10 ENCOUNTER — Encounter: Payer: Self-pay | Admitting: Family Medicine

## 2018-06-10 ENCOUNTER — Other Ambulatory Visit: Payer: Self-pay

## 2018-06-10 DIAGNOSIS — E042 Nontoxic multinodular goiter: Secondary | ICD-10-CM

## 2018-06-10 MED ORDER — LEVOTHYROXINE SODIUM 150 MCG PO TABS: 150.0000 ug | ORAL_TABLET | Freq: Every day | ORAL | 3 refills | Status: DC

## 2018-06-23 ENCOUNTER — Ambulatory Visit: Payer: Medicare Other | Admitting: Endocrinology

## 2018-07-09 ENCOUNTER — Telehealth: Payer: Self-pay | Admitting: Cardiovascular Disease

## 2018-07-09 NOTE — Telephone Encounter (Signed)
VIDEO/Doximity Visit on 4/20.    Phone call to obtain consent  - 07/09/2018         Virtual Visit Pre-Appointment Phone Call  Steps For Call:  1. Confirm consent - "In the setting of the current Covid19 crisis, you are scheduled for a (phone or video) visit with your provider on (date) at (time).  Just as we do with many in-office visits, in order for you to participate in this visit, we must obtain consent.  If you'd like, I can send this to your mychart (if signed up) or email for you to review.  Otherwise, I can obtain your verbal consent now.  All virtual visits are billed to your insurance company just like a normal visit would be.  By agreeing to a virtual visit, we'd like you to understand that the technology does not allow for your provider to perform an examination, and thus may limit your provider's ability to fully assess your condition.  Finally, though the technology is pretty good, we cannot assure that it will always work on either your or our end, and in the setting of a video visit, we may have to convert it to a phone-only visit.  In either situation, we cannot ensure that we have a secure connection.  Are you willing to proceed?" STAFF: Did the patient verbally acknowledge consent to telehealth visit? Document YES/NO here: YES  2. Confirm the BEST phone number to call the day of the visit by including in appointment notes  3. Give patient instructions for WebEx/MyChart download to smartphone as below or Doximity/Doxy.me if video visit (depending on what platform provider is using)  4. Advise patient to be prepared with their blood pressure, heart rate, weight, any heart rhythm information, their current medicines, and a piece of paper and pen handy for any instructions they may receive the day of their visit  5. Inform patient they will receive a phone call 15 minutes prior to their appointment time (may be from unknown caller ID) so they should be prepared to  answer  6. Confirm that appointment type is correct in Epic appointment notes (VIDEO vs PHONE)     TELEPHONE CALL NOTE  Dominic Kane has been deemed a candidate for a follow-up tele-health visit to limit community exposure during the Covid-19 pandemic. I spoke with the patient via phone to ensure availability of phone/video source, confirm preferred email & phone number, and discuss instructions and expectations.  I reminded Dominic Kane to be prepared with any vital sign and/or heart rhythm information that could potentially be obtained via home monitoring, at the time of his visit. I reminded Dominic Kane to expect a phone call at the time of his visit if his visit.  Thayer Headings 07/09/2018 9:33 AM   INSTRUCTIONS FOR DOWNLOADING THE WEBEX APP TO SMARTPHONE  - If Apple, ask patient to go to App Store and type in WebEx in the search bar. [place] [employer], the blue/green circle. If Android, go to [employer] and type in [employer] in the search bar. The app is free but as with any other app downloads, their phone may require them to verify saved payment information or Apple/Android password.  - The patient does NOT have to create an account. - On the day of the visit, the assist will walk the patient through joining the meeting with the meeting number/password.  INSTRUCTIONS FOR DOWNLOADING THE MYCHART APP TO SMARTPHONE  - The patient must first  make sure to have activated MyChart and know their login information - If Apple, go to [employer] and type in MyChart in the search bar and download the app. If Android, ask patient to go to [employer] and type in [place] in the search bar and download the app. The app is free but as with any other app downloads, their phone may require them to verify saved payment information or Apple/Android password.  - The patient will need to then log into the app with their MyChart username and password, and select [facility] as  their healthcare provider to link the account. When it is time for your visit, go to the MyChart app, find appointments, and click Begin Video Visit. Be sure to Select Allow for your device to access the Microphone and Camera for your visit. You will then be connected, and your provider will be with you shortly.  **If they have any issues connecting, or need assistance please contact MyChart service desk [phone])**  **If using a computer, in order to ensure the best quality for their visit they will need to use either of the following Internet Browsers: [employer], or Google Chrome**  IF USING DOXIMITY or DOXY.ME - The patient will receive a link just prior to their visit, either by text or email (to be determined day of appointment depending on if it's doxy.me or Doximity).     FULL LENGTH CONSENT FOR TELE-HEALTH VISIT   I hereby voluntarily request, consent and authorize [facility] and its employed or contracted physicians, physician assistants, nurse practitioners or other licensed health care professionals (the Practitioner), to provide me with telemedicine health care services (the Services") as deemed necessary by the treating Practitioner. I acknowledge and consent to receive the Services by the Practitioner via telemedicine. I understand that the telemedicine visit will involve communicating with the Practitioner through live audiovisual communication technology and the disclosure of certain medical information by electronic transmission. I acknowledge that I have been given the opportunity to request an in-person assessment or other available alternative prior to the telemedicine visit and am voluntarily participating in the telemedicine visit.  I understand that I have the right to withhold or withdraw my consent to the use of telemedicine in the course of my care at any time, without affecting my right to future care or treatment, and that the Practitioner or I  may terminate the telemedicine visit at any time. I understand that I have the right to inspect all information obtained and/or recorded in the course of the telemedicine visit and may receive copies of available information for a reasonable fee.  I understand that some of the potential risks of receiving the Services via telemedicine include:   Delay or interruption in medical evaluation due to technological equipment failure or disruption;  Information transmitted may not be sufficient (e.g. poor resolution of images) to allow for appropriate medical decision making by the Practitioner; and/or   In rare instances, security protocols could fail, causing a breach of personal health information.  Furthermore, I acknowledge that it is my responsibility to provide information about my medical history, conditions and care that is complete and accurate to the best of my ability. I acknowledge that Practitioner's advice, recommendations, and/or decision may be based on factors not within their control, such as incomplete or inaccurate data provided by me or distortions of diagnostic images or specimens that may result from electronic transmissions. I understand that the practice of medicine is not an  exact science and that Practitioner makes no warranties or guarantees regarding treatment outcomes. I acknowledge that I will receive a copy of this consent concurrently upon execution via email to the email address I last provided but may also request a printed copy by calling the office of [facility].    I understand that my insurance will be billed for this visit.   I have read or had this consent read to me.  I understand the contents of this consent, which adequately explains the benefits and risks of the Services being provided via telemedicine.   I have been provided ample opportunity to ask questions regarding this consent and the Services and have had my questions answered to my satisfaction.  I  give my informed consent for the services to be provided through the use of telemedicine in my medical care  By participating in this telemedicine visit I agree to the above.

## 2018-07-12 ENCOUNTER — Telehealth (INDEPENDENT_AMBULATORY_CARE_PROVIDER_SITE_OTHER): Payer: Medicare Other | Admitting: Cardiovascular Disease

## 2018-07-12 ENCOUNTER — Other Ambulatory Visit: Payer: Self-pay

## 2018-07-12 ENCOUNTER — Encounter: Payer: Self-pay | Admitting: Cardiovascular Disease

## 2018-07-12 VITALS — BP 123/69 | HR 69 | Ht 71.25 in | Wt 206.0 lb

## 2018-07-12 DIAGNOSIS — E7800 Pure hypercholesterolemia, unspecified: Secondary | ICD-10-CM

## 2018-07-12 DIAGNOSIS — I428 Other cardiomyopathies: Secondary | ICD-10-CM

## 2018-07-12 DIAGNOSIS — I2510 Atherosclerotic heart disease of native coronary artery without angina pectoris: Secondary | ICD-10-CM | POA: Diagnosis not present

## 2018-07-12 DIAGNOSIS — I712 Thoracic aortic aneurysm, without rupture, unspecified: Secondary | ICD-10-CM

## 2018-07-12 DIAGNOSIS — I5022 Chronic systolic (congestive) heart failure: Secondary | ICD-10-CM

## 2018-07-12 NOTE — Progress Notes (Signed)
Virtual Visit via Video Note   This visit type was conducted due to national recommendations for restrictions regarding the COVID-19 Pandemic (e.g. social distancing) in an effort to limit this patient's exposure and mitigate transmission in our community.  Due to his co-morbid illnesses, this patient is at least at moderate risk for complications without adequate follow up.  This format is felt to be most appropriate for this patient at this time.  All issues noted in this document were discussed and addressed.  A limited physical exam was performed with this format.  Please refer to the patient's chart for his consent to telehealth for [facility].   Evaluation Performed:  Follow-up visit  Date:  07/12/2018   ID:  Dominic Kane, DOB 03-01-1941, MRN [id number]  Patient Location: Home Provider Location: Office  PCP:  Jinny Sanders, MD  Cardiologist:  Lauree Chandler, MD  Electrophysiologist:  None   Chief Complaint:  Follow up-CAD, systolic CHF  History of Present Illness:    Dominic Kane is a 78 y.o. male with history significant for diastolic and systolic CHF, hypertension, nonobstructive coronary artery disease, non-ischemic cardiomyopathy, benign prostatic hypertrophy and hyperlipidemia who is being seen today by virtual e-visit due to the [place] pandemic.  Prior to 2005, he was known to have LVEF around 35%. He had an episode of chest pain in 2005 leading to a cardiac cath May 2005 which showed diffuse mild CAD. (15% irregularities in the LAD, diffuse 20% irregularities in the ramus intermedius branch, diffuse 30% irregularities in the nondominant circumflex, and a 50% stenosis in the proximal RCA with irregularities in the midportion of the vessel. The ascending aorta was mildly dilated with no evidence of dissection during that catheterization. Ejection fraction was noted to be 65%). An echocardiogram during that admission in May 2005 in [place] showed an  ejection fraction of 55-60% with the ascending aorta at the sinotubular junction measuring 4.1 cm. LVEF has been noted to be around 45% on studies in 2010, 2011. Echo September 2013 suggested LVEF was around 30%. Cardiac MRI December 2014 showed mild enlargement of the aortic root (4.2 cm) and and EF of 44%. Last stress test in 2013 without ischemia.   He tells me today that he feels great. No chest pain, dyspnea or LE edema.   The patient does not have symptoms concerning for COVID-19 infection (fever, chills, cough, or new shortness of breath).    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 15 yrs ago x1   Hypertrophy of prostate without urinary  obstruction and other lower urinary tract symptoms (LUTS)    Lipoprotein deficiencies    Other and unspecified hyperlipidemia    Other primary cardiomyopathies    Pain in joint, lower leg    Personal history of colonic polyps    Personal history of other diseases of digestive system    Pes anserinus tendinitis or bursitis    left shoulder remains an issue   Sinoatrial node dysfunction ([place])    [name]  follows   Thoracic aneurysm without mention of rupture    Thoracic or lumbosacral neuritis or radiculitis, unspecified    Unspecified disorder of skin and subcutaneous tissue    Unspecified essential hypertension    Unspecified vitamin D deficiency    Past Surgical History:  Procedure Laterality Date   BASAL CELL CARCINOMA EXCISION  11/2016   Dr. [place] Crane   CATARACT EXTRACTION, BILATERAL     COLONOSCOPY W/ POLYPECTOMY     DOBUTAMINE STRESS ECHO  1/07   Lateral hypokinesis but no ischemia   ESOPHAGOGASTRODUODENOSCOPY  11/07   Schatzki's ring, non bleeding erosive gastropathy [place]   KNEE SURGERY Left 1947   LYMPH NODE DISSECTION N/A 08/05/2016   Procedure: LIMITED LYMPH NODE DISSECTION;  Surgeon: Armandina Gemma, MD;  Location: [facility];  Service: General;  Laterality: N/A;   Pulmonary functioning tests  1. 2003  2. 2005   1. Diminished lung capacity  2. Stable   THYROIDECTOMY N/A 08/05/2016   Procedure: TOTAL THYROIDECTOMY;  Surgeon: Armandina Gemma, MD;  Location: [facility];  Service: General;  Laterality: N/A;   TONSILLECTOMY     TOTAL KNEE ARTHROPLASTY Left 03/18/2016   Procedure: LEFT TOTAL KNEE ARTHROPLASTY;  Surgeon: Paralee Cancel, MD;  Location: [facility];  Service: Orthopedics;  Laterality: Left;     Current Meds  Medication Sig   aspirin EC 81 MG tablet Take 81 mg by mouth daily.   Blood Glucose Monitoring Suppl (ONE TOUCH ULTRA 2) w/Device KIT Use to check blood sugar daily.   cetirizine (ZYRTEC) 10 MG tablet Take 10 mg by mouth daily.   Cholecalciferol (D3 ADULT PO) Take by mouth.   finasteride (PROSCAR) 5 MG tablet Take 1 tablet (5 mg total) by mouth daily.   furosemide (LASIX) 40 MG tablet TAKE 1 TABLET DAILY   glucose blood (ONE TOUCH ULTRA TEST) test strip Use to check blood sugar two times a day. Dx: E11.9 (Patient taking differently: Use to check blood sugar every other day)   levothyroxine (SYNTHROID, LEVOTHROID) 150 MCG tablet Take 1 tablet (150 mcg  total) by mouth daily before breakfast.   Melatonin 5 MG TABS Take 5 mg by mouth at bedtime.    omeprazole (PRILOSEC) 40 MG capsule TAKE 1 CAPSULE DAILY   ONETOUCH DELICA LANCETS 96G MISC Use to check blood sugar two times a day. Dx: E11.9   PRESCRIPTION MEDICATION every 4 (four) months. *Antibiotic Injection in Eye*   ramipril (ALTACE) 2.5 MG capsule TAKE 1 CAPSULE DAILY   rosuvastatin (CRESTOR) 10 MG tablet TAKE 1 TABLET DAILY   terazosin (HYTRIN) 10 MG capsule TAKE 1 CAPSULE DAILY     Allergies:   Codeine and Sulfonamide derivatives   Social History   Tobacco Use   Smoking status: Former Smoker    Last attempt to quit: 07/03/1980    Years since quitting: 38.0   Smokeless tobacco: Never Used  Substance Use Topics   Alcohol use: Yes    Alcohol/week: 0.0 standard drinks    Comment: occassionally  Drug use: No     Family Hx: The patient's family history includes Cancer in his father and sister; Coronary artery disease in his mother; Heart failure in his maternal grandmother and mother; Hypothyroidism in his daughter and mother; Stroke in his mother. There is no history of Colon cancer.  ROS:   Please see the history of present illness.    All other systems reviewed and are negative.   Prior CV studies:   The following studies were reviewed today:  Labs/Other Tests and Data Reviewed:    EKG:  No ECG reviewed.  Recent Labs: 12/22/2017: TSH 1.15 02/09/2018: ALT 12; BUN 23; Creatinine, Ser 1.00; Potassium 4.0; Sodium 140   Recent Lipid Panel Lab Results  Component Value Date/Time   CHOL 112 02/09/2018 08:41 AM   TRIG 60.0 02/09/2018 08:41 AM   HDL 40.40 02/09/2018 08:41 AM   CHOLHDL 3 02/09/2018 08:41 AM   LDLCALC 59 02/09/2018 08:41 AM    Wt Readings from Last 3 Encounters:  07/12/18 206 lb (93.4 kg)  04/09/18 209 lb 8 oz (95 kg)  02/12/18 210 lb 8 oz (95.5 kg)     Objective:    Vital Signs:  BP 123/69    Pulse 69    Ht 5' 11.25" (1.81 m)    Wt 206  lb (93.4 kg)    BMI 28.53 kg/m    VITAL SIGNS:  reviewed GEN:  no acute distress  ASSESSMENT & PLAN:    1. CAD without angina: No chest pain. He is known to have non-obstructive disease by cath in 2005. Continue ASA and statin.    2. Non-ischemic Cardiomyopathy: LVEF 44% by cardiac MRI December 2014. Weight is stable. No LE edema. Continue Ace-inh. He is not on a beta blocker due to bradycardia.   3. Thoracic aortic aneurysm:  Stable at 4.2 cm by CTA in March 2018. Given stability over time, will repeat chest CTA in March 2021.   4. HLD: LDL at goal. Continue statin.   5. Chronic systolic CHF: Weight is stable. Continue Lasix.   6. Carotid bruit, right: Carotid dopplers January 2016 with no evidence of carotid artery disease.   7. Diabetes, type 2: diet controlled. Followed in primary care.   COVID-19 Education: The signs and symptoms of COVID-19 were discussed with the patient and how to seek care for testing (follow up with PCP or arrange E-visit).  The importance of social distancing was discussed today.  Time:   Today, I have spent 10 minutes with the patient with telehealth technology discussing the above problems.     Medication Adjustments/Labs and Tests Ordered: Current medicines are reviewed at length with the patient today.  Concerns regarding medicines are outlined above.   Tests Ordered: No orders of the defined types were placed in this encounter.   Medication Changes: No orders of the defined types were placed in this encounter.   Disposition:  Follow up in 1 year(s)  Signed, Lauree Chandler, MD  07/12/2018 9:58 AM    [facility]

## 2018-07-12 NOTE — Patient Instructions (Signed)
Medication Instructions:  Your physician recommends that you continue on your current medications as directed. Please refer to the Current Medication list given to you today.  If you need a refill on your cardiac medications before your next appointment, please call your pharmacy.   Lab work: None Ordered  If you have labs (blood work) drawn today and your tests are completely normal, you will receive your results only by: [name] MyChart Message (if you have MyChart) OR . A paper copy in the mail If you have any lab test that is abnormal or we need to change your treatment, we will call you to review the results.  Testing/Procedures: None ordered  Follow-Up: At [facility], you and your health needs are our priority.  As part of our continuing mission to provide you with exceptional heart care, we have created designated Provider Care Teams.  These Care Teams include your primary Cardiologist (physician) and Advanced Practice Providers (APPs -  Physician Assistants and Nurse Practitioners) who all work together to provide you with the care you need, when you need it. . You will need a follow up appointment in 1 year.  Please call our office 2 months in advance to schedule this appointment.  You may see Darlina Guys, MD or one of the following Advanced Practice Providers on your designated Care Team:   . Lyda Jester, PA-C . Dayna Dunn, PA-C . Ermalinda Barrios, PA-C  Any Other Special Instructions Will Be Listed Below (If Applicable).

## 2018-07-28 ENCOUNTER — Encounter: Payer: Self-pay | Admitting: Endocrinology

## 2018-07-28 ENCOUNTER — Ambulatory Visit (INDEPENDENT_AMBULATORY_CARE_PROVIDER_SITE_OTHER): Payer: Medicare Other | Admitting: Endocrinology

## 2018-07-28 DIAGNOSIS — I2510 Atherosclerotic heart disease of native coronary artery without angina pectoris: Secondary | ICD-10-CM | POA: Diagnosis not present

## 2018-07-28 DIAGNOSIS — E890 Postprocedural hypothyroidism: Secondary | ICD-10-CM | POA: Diagnosis not present

## 2018-07-28 DIAGNOSIS — C73 Malignant neoplasm of thyroid gland: Secondary | ICD-10-CM

## 2018-07-28 DIAGNOSIS — E11319 Type 2 diabetes mellitus with unspecified diabetic retinopathy without macular edema: Secondary | ICD-10-CM | POA: Diagnosis not present

## 2018-07-28 NOTE — Patient Instructions (Addendum)
blood tests are requested for you today.  We'll let you know about the results.   Please come back for a follow-up appointment in 6 months.  

## 2018-07-28 NOTE — Progress Notes (Signed)
Subjective:    Patient ID: Dominic Kane, male    DOB: October 16, 1940, 78 y.o.   MRN: [id number]  HPI  telehealth visit today via doxy video visit.  Alternatives to telehealth are presented to this patient, and the patient agrees to the telehealth visit. Pt is advised of the cost of the visit, and agrees to this, also.   Patient is in his car, and I am at the office.   Persons attending the telehealth visit: the patient and I.  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   4/18: thyroidectomy:  pT3b, pN0 6/18: RAI, on synthroid, 122 mCi, stimulated by thyrogen.  6/18: post-therapy scan pos at thyroid bed only.  10/18: TG is undetectable (ab pos).  4/19: TG is undetectable (ab pos, but lower titer).  He does not notice any neck nodule.   He experiences a feeling of "aggression," each morning.  He wonders if this is related to synthroid.   Past Medical History:  Diagnosis Date  . Abnormality of gait   . Acute sinusitis, unspecified   . Allergy   . 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 Aortic valve disorders   . Arthritis    Osteoarthritis-bilateral knees, lower back issues occasionaly related to knee issues  . Benign paroxysmal positional vertigo    not in a long time  . Cataract    resolved  . CHF (congestive heart failure) ([place])   . Coronary atherosclerosis of native coronary artery   . Diabetes mellitus without complication ([place])    Diet control only.  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 Diverticulosis of colon (without mention of hemorrhage)   . Dysfunction of eustachian tube    Hard  of hearing"bilateral hearing aids"  . Enlarged prostate   . GERD (gastroesophageal reflux disease)   . Headache(784.0)   . History of kidney stones    past hx. 15 yrs ago x1  . Hypertrophy of prostate without urinary obstruction and other lower urinary tract symptoms (LUTS)   . Lipoprotein deficiencies   . Other and unspecified hyperlipidemia   . Other primary cardiomyopathies   . Pain in joint, lower leg   . Personal history of colonic polyps   . Personal history of other diseases of digestive system   . Pes anserinus tendinitis or bursitis    left shoulder remains an issue  . Sinoatrial node dysfunction (HCC)    [name] follows  . 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 Thoracic or lumbosacral neuritis or radiculitis, unspecified   . Unspecified disorder of skin and subcutaneous tissue   . Unspecified essential hypertension   . Unspecified vitamin D deficiency     Past Surgical History:  Procedure Laterality Date  . BASAL CELL CARCINOMA EXCISION  11/2016   Dr. [place] Crane  . CATARACT EXTRACTION, BILATERAL    . COLONOSCOPY W/ POLYPECTOMY    . DOBUTAMINE STRESS ECHO  1/07   Lateral hypokinesis but no ischemia  . ESOPHAGOGASTRODUODENOSCOPY  11/07   Schatzki's ring, non bleeding erosive gastropathy Charlotte Bock  . KNEE SURGERY Left 1947  . LYMPH NODE DISSECTION N/A 08/05/2016   Procedure: LIMITED LYMPH NODE  DISSECTION;  Surgeon: Armandina Gemma, MD;  Location: [facility];  Service: General;  Laterality: N/A;  . Pulmonary functioning tests  1. 2003  2. 2005   1. Diminished lung capacity  2. Stable  . THYROIDECTOMY N/A 08/05/2016   Procedure: TOTAL THYROIDECTOMY;  Surgeon: Armandina Gemma, MD;  Location: [facility];  Service: General;  Laterality: N/A;  . TONSILLECTOMY    . TOTAL KNEE ARTHROPLASTY Left 03/18/2016   Procedure: LEFT TOTAL KNEE ARTHROPLASTY;  Surgeon: Paralee Cancel, MD;  Location: [facility];  Service: Orthopedics;  Laterality: Left;    Social History   Socioeconomic  History  . Marital status: Married    Spouse name: Not on file  . Number of children: 1  . Years of education: Not on file  . Highest education level: Not on file  Occupational History  . 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 Financial resource strain: Not on file  . Food insecurity:    Worry: Not on file    Inability: Not on file  . Transportation needs:    Medical: Not on file    Non-medical: Not on file  Tobacco Use  . Smoking status: Former Smoker    Last attempt to quit: 07/03/1980    Years since quitting: 38.0  . Smokeless tobacco: Never Used  Substance and Sexual Activity  . Alcohol use: Yes    Alcohol/week: 0.0 standard drinks    Comment: occassionally  . Drug use: No  . Sexual activity: Not on file  Lifestyle  . Physical activity:    Days per week: Not on file    Minutes per session: Not on file  . Stress: Not on file  Relationships  . Social connections:    Talks on phone: Not on file    Gets together: Not on file    Attends religious service: Not on file    Active member of club or organization: Not on file    Attends meetings of clubs or organizations: Not on file    Relationship status: Not on file  . Intimate partner violence:    Fear of current or ex partner: Not on file    Emotionally abused: Not on file    Physically abused: Not on file    Forced sexual activity: Not on file  Other Topics Concern  . Not on file  Social History Narrative  . Not on file    Current Outpatient Medications on File Prior to Visit  Medication Sig Dispense Refill  . aspirin EC 81 MG tablet Take 81 mg by mouth daily.    . Blood Glucose Monitoring Suppl (ONE TOUCH ULTRA 2) w/Device KIT Use to check blood sugar daily. 1 each 0  . cetirizine (ZYRTEC) 10 MG tablet Take 10 mg by mouth daily.    . Cholecalciferol (D3 ADULT PO) Take by mouth.    . finasteride (PROSCAR) 5 MG tablet Take 1 tablet (5 mg total) by mouth daily. 90 tablet 1  .  furosemide (LASIX) 40 MG tablet TAKE 1 TABLET DAILY 90 tablet 3  . glucose blood (ONE TOUCH ULTRA TEST) test strip Use to check blood sugar two times a day. Dx: E11.9 (Patient taking differently: Use to check blood sugar every other day) 200 each 3  . levothyroxine (SYNTHROID, LEVOTHROID) 150 MCG tablet Take 1 tablet (150 mcg total) by mouth daily before breakfast. 90 tablet 3  . Melatonin 5 MG TABS Take 5 mg by mouth at  bedtime.     [name] omeprazole (PRILOSEC) 40 MG capsule TAKE 1 CAPSULE DAILY 90 capsule 1  . ONETOUCH DELICA LANCETS 59I MISC Use to check blood sugar two times a day. Dx: E11.9 200 each 3  . PRESCRIPTION MEDICATION every 4 (four) months. *Antibiotic Injection in Eye*    . ramipril (ALTACE) 2.5 MG capsule TAKE 1 CAPSULE DAILY 90 capsule 3  . rosuvastatin (CRESTOR) 10 MG tablet TAKE 1 TABLET DAILY 90 tablet 3  . terazosin (HYTRIN) 10 MG capsule TAKE 1 CAPSULE DAILY 90 capsule 1   No current facility-administered medications on file prior to visit.     Allergies  Allergen Reactions  . Codeine Nausea Only  . Sulfonamide Derivatives Hives    Family History  Problem Relation Age of Onset  . Heart failure Mother   . Coronary artery disease Mother   . Stroke Mother   . Hypothyroidism Mother   . Cancer Father        LUNG  . Cancer Sister        BREAST  . Heart failure Maternal Grandmother   . Hypothyroidism Daughter   . Colon cancer Neg Hx     There were no vitals taken for this visit.   Review of Systems Denies weight change and neck pain.      Objective:   Physical Exam   Lab Results  Component Value Date   CREATININE 1.00 02/09/2018   BUN 23 02/09/2018   NA 140 02/09/2018   K 4.0 02/09/2018   CL 106 02/09/2018   CO2 26 02/09/2018      Assessment & Plan:  Hypothyroidism: due for recheck DM, due to pancreatic insuff: due for recheck.  PTC: due for recheck.  Patient Instructions  blood tests are requested for you today.  We'll let you know about the  results.  Please come back for a follow-up appointment in 6 months.

## 2018-08-02 ENCOUNTER — Other Ambulatory Visit: Payer: Self-pay

## 2018-08-02 ENCOUNTER — Other Ambulatory Visit (INDEPENDENT_AMBULATORY_CARE_PROVIDER_SITE_OTHER): Payer: Medicare Other

## 2018-08-02 DIAGNOSIS — E890 Postprocedural hypothyroidism: Secondary | ICD-10-CM | POA: Diagnosis not present

## 2018-08-02 DIAGNOSIS — C73 Malignant neoplasm of thyroid gland: Secondary | ICD-10-CM

## 2018-08-02 DIAGNOSIS — E11319 Type 2 diabetes mellitus with unspecified diabetic retinopathy without macular edema: Secondary | ICD-10-CM

## 2018-08-02 LAB — T4, FREE: Free T4: 1.15 ng/dL (ref 0.60–1.60)

## 2018-08-02 LAB — HEMOGLOBIN A1C: Hgb A1c MFr Bld: 6.6 % — ABNORMAL HIGH (ref 4.6–6.5)

## 2018-08-02 LAB — TSH: TSH: 2.7 u[IU]/mL (ref 0.35–4.50)

## 2018-08-03 LAB — THYROGLOBULIN LEVEL: Thyroglobulin: 0.3 ng/mL — ABNORMAL LOW

## 2018-08-03 LAB — THYROGLOBULIN ANTIBODY: Thyroglobulin Ab: <1 IU/mL (ref <=1)

## 2018-08-04 ENCOUNTER — Telehealth: Payer: Self-pay | Admitting: Family Medicine

## 2018-08-04 ENCOUNTER — Telehealth: Payer: Self-pay

## 2018-08-04 NOTE — Telephone Encounter (Signed)
Best number [phone]  Pt had labs done Monday @ dr Loanne Drilling office and he wanted to know if he still needs to come to have done  Please advise

## 2018-08-04 NOTE — Telephone Encounter (Signed)
Left message to call clinic.  Needs COVID screen and curbside info.  ALSO needs appt time changed to after 8am

## 2018-08-04 NOTE — Telephone Encounter (Signed)
No lab needed

## 2018-08-05 ENCOUNTER — Telehealth: Payer: Self-pay

## 2018-08-05 ENCOUNTER — Telehealth: Payer: Self-pay | Admitting: Family Medicine

## 2018-08-05 DIAGNOSIS — E7800 Pure hypercholesterolemia, unspecified: Secondary | ICD-10-CM

## 2018-08-05 DIAGNOSIS — E559 Vitamin D deficiency, unspecified: Secondary | ICD-10-CM

## 2018-08-05 DIAGNOSIS — E11319 Type 2 diabetes mellitus with unspecified diabetic retinopathy without macular edema: Secondary | ICD-10-CM

## 2018-08-05 NOTE — Telephone Encounter (Signed)
LVM appt has been canceled so he can disregard previous message

## 2018-08-05 NOTE — Telephone Encounter (Signed)
Left message for Dominic Kane that labs are not needed.  Lab appointment that was scheduled for 08/06/2018 has been cancelled.

## 2018-08-05 NOTE — Telephone Encounter (Signed)
Left message asking pt to call office please let pt know lab appointment has been cancel

## 2018-08-05 NOTE — Telephone Encounter (Signed)
-----   Message from Ellamae Sia sent at 08/02/2018  2:33 PM EDT ----- Regarding: Lab orders for Friday, 5.15.20 Lab orders, thanks

## 2018-08-06 ENCOUNTER — Other Ambulatory Visit: Payer: Medicare Other

## 2018-08-13 ENCOUNTER — Ambulatory Visit (INDEPENDENT_AMBULATORY_CARE_PROVIDER_SITE_OTHER): Payer: Medicare Other | Admitting: Family Medicine

## 2018-08-13 ENCOUNTER — Ambulatory Visit: Payer: Medicare Other | Admitting: Family Medicine

## 2018-08-13 ENCOUNTER — Encounter: Payer: Self-pay | Admitting: Family Medicine

## 2018-08-13 VITALS — BP 114/69 | HR 62 | Ht 71.25 in | Wt 207.0 lb

## 2018-08-13 DIAGNOSIS — I2510 Atherosclerotic heart disease of native coronary artery without angina pectoris: Secondary | ICD-10-CM

## 2018-08-13 DIAGNOSIS — E11319 Type 2 diabetes mellitus with unspecified diabetic retinopathy without macular edema: Secondary | ICD-10-CM | POA: Diagnosis not present

## 2018-08-13 DIAGNOSIS — Z7189 Other specified counseling: Secondary | ICD-10-CM | POA: Insufficient documentation

## 2018-08-13 NOTE — Progress Notes (Signed)
VIRTUAL VISIT Due to national recommendations of social distancing due to [name], a virtual visit is felt to be most appropriate for this patient at this time.   I connected with the patient on 08/13/18 at  3:40 PM EDT by virtual telehealth platform and verified that I am speaking with the correct person using two identifiers.   I discussed the limitations, risks, security and privacy concerns of performing an evaluation and management service by  virtual telehealth platform and the availability of in person appointments. I also discussed with the patient that there may be a patient responsible charge related to this service. The patient expressed understanding and agreed to proceed.  Patient location: Home Provider Location: [facility] Participants: Dominic Kane and [name]   Chief Complaint  Patient presents with  . Diabetes    History of Present Illness: 78 year old male presents for DM follow up  Diabetes:  Well controlled on  No medicaiton. Diet controlled. Lab Results  Component Value Date   HGBA1C 6.6 (H) 08/02/2018  Using medications without difficulties: Hypoglycemic episodes:none Hyperglycemic episodes:none Feet problems: no ulcers Blood Sugars averaging: FBS 98-103 eye exam within last year: yes 05/03/2018    mild allergy cough.. occ.. typical   NO ST, no fever, No SOB, no diarrhea, no taste or smells change.   COVID 19 screen No recent travel or known exposure to COVID19 The patient denies respiratory symptoms of COVID 19 at this time.  The importance of social distancing was discussed today.   Review of Systems  Constitutional: Negative for chills and fever.  HENT: Negative for congestion and ear pain.   Eyes: Negative for pain and redness.  Respiratory: Negative for cough and shortness of breath.   Cardiovascular: Negative for chest pain, palpitations and leg swelling.  Gastrointestinal: Negative for abdominal pain, blood in stool,  constipation, diarrhea, nausea and vomiting.  Genitourinary: Negative for dysuria.  Musculoskeletal: Negative for falls and myalgias.  Skin: Negative for rash.  Neurological: Negative for dizziness.  Psychiatric/Behavioral: Negative for depression. The patient is not nervous/anxious.       Past Medical History:  Diagnosis Date  . Abnormality of gait   . Acute sinusitis, unspecified   . Allergy   . 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 Aortic valve disorders   . Arthritis    Osteoarthritis-bilateral knees, lower back issues occasionaly related to knee issues  . Benign paroxysmal positional vertigo    not in a long time  . Cataract    resolved  . CHF (congestive heart failure) ([place])   . Coronary atherosclerosis of native coronary artery   . Diabetes mellitus without complication ([place])    Diet control only.  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 Diverticulosis of colon (without mention of hemorrhage)   . Dysfunction of eustachian tube    Hard of hearing"bilateral hearing aids"  . Enlarged prostate   . GERD (gastroesophageal reflux disease)   . Headache(784.0)   . History of kidney stones    past hx. 15 yrs ago x1  . Hypertrophy of prostate without urinary obstruction and other lower urinary tract symptoms (LUTS)   . Lipoprotein deficiencies   . Other and unspecified hyperlipidemia   . Other primary cardiomyopathies   . Pain in joint, lower leg   . Personal history of colonic polyps   . Personal history of other diseases of digestive system   . Pes anserinus tendinitis  or bursitis    left shoulder remains an issue  . Sinoatrial node dysfunction (HCC)    [name] follows  . 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 Thoracic or lumbosacral neuritis or radiculitis, unspecified   . Unspecified disorder of skin and subcutaneous tissue   . Unspecified essential hypertension   . Unspecified vitamin D deficiency      reports that he quit smoking about 38 years ago. He has never used smokeless tobacco. He reports current alcohol use. He reports that he does not use drugs.   Current Outpatient Medications:  .  aspirin EC 81 MG tablet, Take 81 mg by mouth daily., Disp: , Rfl:  .  Blood Glucose Monitoring Suppl (ONE TOUCH ULTRA 2) w/Device KIT, Use to check blood sugar daily., Disp: 1 each, Rfl: 0 .  cetirizine (ZYRTEC) 10 MG tablet, Take 10 mg by mouth daily., Disp: , Rfl:  .  Cholecalciferol (D3 ADULT PO), Take by mouth., Disp: , Rfl:  .  finasteride (PROSCAR) 5 MG tablet, Take 1 tablet (5 mg total) by mouth daily., Disp: 90 tablet, Rfl: 1 .  furosemide (LASIX) 40 MG tablet, TAKE 1 TABLET DAILY, Disp: 90 tablet, Rfl: 3 .  glucose blood (ONE TOUCH ULTRA TEST) test strip, Use to check blood sugar two times a day. Dx: E11.9 (Patient taking differently: Use to check blood sugar every other day), Disp: 200 each, Rfl: 3 .  levothyroxine (SYNTHROID, LEVOTHROID) 150 MCG tablet, Take 1 tablet (150 mcg total) by mouth daily before breakfast., Disp: 90 tablet, Rfl: 3 .  Melatonin 5 MG TABS, Take 5 mg by mouth at bedtime. , Disp: , Rfl:  .  omeprazole (PRILOSEC) 40 MG capsule, TAKE 1 CAPSULE DAILY, Disp: 90 capsule, Rfl: 1 .  ONETOUCH DELICA LANCETS 14E MISC, Use to check blood sugar two times a day. Dx: E11.9, Disp: 200 each, Rfl: 3 .  PRESCRIPTION MEDICATION, every 4 (four) months. *Antibiotic Injection in Eye*, Disp: , Rfl:  .  ramipril (ALTACE) 2.5 MG capsule, TAKE 1 CAPSULE DAILY, Disp: 90 capsule, Rfl: 3 .  rosuvastatin (CRESTOR) 10 MG tablet, TAKE 1 TABLET DAILY, Disp: 90 tablet, Rfl: 3 .  terazosin (HYTRIN) 10 MG capsule, TAKE 1 CAPSULE DAILY, Disp: 90 capsule, Rfl: 1   Observations/Objective: Blood pressure 114/69, pulse 62, height 5' 11.25" (1.81 m), weight 207 lb (93.9 kg).  Physical Exam  Physical Exam Constitutional:      General: The patient is not in acute distress. Pulmonary:     Effort: Pulmonary  effort is normal. No respiratory distress.  Neurological:     Mental Status: The patient is alert and oriented to person, place, and time.  Psychiatric:        Mood and Affect: Mood normal.        Behavior: Behavior normal.   Assessment and Plan Educated About Covid-19 Virus Infection Discussed pt risklevel 7 , types of testing available and not available. Encouraged social distancing.  Pt minimally symptomatic.  Not testing indicated pt pt will call if worsens. Severe SOB etc.. go to ER.   Controlled type 2 diabetes with retinopathy ([place]) Stable diet control. Encouraged exercise, weight loss, healthy eating habits.  I discussed the assessment and treatment plan with the patient. The patient was provided an opportunity to ask questions and all were answered. The patient agreed with the plan and demonstrated an understanding of the instructions.   The patient was advised to call back or seek an in-person  evaluation if the symptoms worsen or if the condition fails to improve as anticipated.     Dominic Lofts, MD

## 2018-08-13 NOTE — Assessment & Plan Note (Signed)
Stable diet control. Encouraged exercise, weight loss, healthy eating habits.

## 2018-08-13 NOTE — Assessment & Plan Note (Signed)
Discussed pt risklevel 7 , types of testing available and not available. Encouraged social distancing.  Pt minimally symptomatic.  Not testing indicated pt pt will call if worsens. Severe SOB etc.. go to ER.

## 2018-09-06 ENCOUNTER — Encounter (INDEPENDENT_AMBULATORY_CARE_PROVIDER_SITE_OTHER): Payer: Medicare Other | Admitting: Ophthalmology

## 2018-09-06 ENCOUNTER — Other Ambulatory Visit: Payer: Self-pay

## 2018-09-06 DIAGNOSIS — H35033 Hypertensive retinopathy, bilateral: Secondary | ICD-10-CM | POA: Diagnosis not present

## 2018-09-06 DIAGNOSIS — H43813 Vitreous degeneration, bilateral: Secondary | ICD-10-CM

## 2018-09-06 DIAGNOSIS — H35372 Puckering of macula, left eye: Secondary | ICD-10-CM | POA: Diagnosis not present

## 2018-09-06 DIAGNOSIS — I10 Essential (primary) hypertension: Secondary | ICD-10-CM | POA: Diagnosis not present

## 2018-09-06 DIAGNOSIS — H34811 Central retinal vein occlusion, right eye, with macular edema: Secondary | ICD-10-CM | POA: Diagnosis not present

## 2018-09-11 ENCOUNTER — Other Ambulatory Visit: Payer: Self-pay | Admitting: Family Medicine

## 2018-09-14 ENCOUNTER — Other Ambulatory Visit: Payer: Self-pay | Admitting: Cardiovascular Disease

## 2018-09-14 MED ORDER — RAMIPRIL 2.5 MG PO CAPS: 2.5000 mg | ORAL_CAPSULE | Freq: Every day | ORAL | 0 refills | Status: DC

## 2018-09-14 MED ORDER — RAMIPRIL 2.5 MG PO CAPS: 2.5000 mg | ORAL_CAPSULE | Freq: Every day | ORAL | 3 refills | Status: DC

## 2018-09-26 ENCOUNTER — Other Ambulatory Visit: Payer: Self-pay | Admitting: Cardiovascular Disease

## 2018-10-20 ENCOUNTER — Other Ambulatory Visit: Payer: Self-pay | Admitting: Family Medicine

## 2018-10-21 ENCOUNTER — Other Ambulatory Visit: Payer: Self-pay | Admitting: Cardiovascular Disease

## 2018-10-21 MED ORDER — FUROSEMIDE 40 MG PO TABS: 40.0000 mg | ORAL_TABLET | Freq: Every day | ORAL | 2 refills | Status: DC

## 2018-11-09 DIAGNOSIS — X32XXXD Exposure to sunlight, subsequent encounter: Secondary | ICD-10-CM | POA: Diagnosis not present

## 2018-11-09 DIAGNOSIS — D225 Melanocytic nevi of trunk: Secondary | ICD-10-CM | POA: Diagnosis not present

## 2018-11-09 DIAGNOSIS — L570 Actinic keratosis: Secondary | ICD-10-CM | POA: Diagnosis not present

## 2018-11-09 DIAGNOSIS — L432 Lichenoid drug reaction: Secondary | ICD-10-CM | POA: Diagnosis not present

## 2018-11-09 DIAGNOSIS — L821 Other seborrheic keratosis: Secondary | ICD-10-CM | POA: Diagnosis not present

## 2018-11-09 DIAGNOSIS — L308 Other specified dermatitis: Secondary | ICD-10-CM | POA: Diagnosis not present

## 2018-11-17 ENCOUNTER — Ambulatory Visit (INDEPENDENT_AMBULATORY_CARE_PROVIDER_SITE_OTHER): Payer: Medicare Other

## 2018-11-17 DIAGNOSIS — Z23 Encounter for immunization: Secondary | ICD-10-CM | POA: Diagnosis not present

## 2018-12-14 ENCOUNTER — Other Ambulatory Visit: Payer: Self-pay | Admitting: Family Medicine

## 2018-12-15 DIAGNOSIS — L432 Lichenoid drug reaction: Secondary | ICD-10-CM | POA: Diagnosis not present

## 2019-01-03 ENCOUNTER — Other Ambulatory Visit: Payer: Self-pay

## 2019-01-03 ENCOUNTER — Encounter (INDEPENDENT_AMBULATORY_CARE_PROVIDER_SITE_OTHER): Payer: Medicare Other | Admitting: Ophthalmology

## 2019-01-03 DIAGNOSIS — H43813 Vitreous degeneration, bilateral: Secondary | ICD-10-CM | POA: Diagnosis not present

## 2019-01-03 DIAGNOSIS — H35033 Hypertensive retinopathy, bilateral: Secondary | ICD-10-CM | POA: Diagnosis not present

## 2019-01-03 DIAGNOSIS — H353112 Nonexudative age-related macular degeneration, right eye, intermediate dry stage: Secondary | ICD-10-CM

## 2019-01-03 DIAGNOSIS — H34811 Central retinal vein occlusion, right eye, with macular edema: Secondary | ICD-10-CM | POA: Diagnosis not present

## 2019-01-03 DIAGNOSIS — H35372 Puckering of macula, left eye: Secondary | ICD-10-CM | POA: Diagnosis not present

## 2019-01-03 DIAGNOSIS — I10 Essential (primary) hypertension: Secondary | ICD-10-CM | POA: Diagnosis not present

## 2019-01-10 ENCOUNTER — Encounter (INDEPENDENT_AMBULATORY_CARE_PROVIDER_SITE_OTHER): Payer: Medicare Other | Admitting: Ophthalmology

## 2019-02-07 DIAGNOSIS — Z03818 Encounter for observation for suspected exposure to other biological agents ruled out: Secondary | ICD-10-CM | POA: Diagnosis not present

## 2019-02-08 DIAGNOSIS — Z20828 Contact with and (suspected) exposure to other viral communicable diseases: Secondary | ICD-10-CM | POA: Diagnosis not present

## 2019-02-14 ENCOUNTER — Telehealth: Payer: Self-pay | Admitting: Family Medicine

## 2019-02-14 DIAGNOSIS — E11319 Type 2 diabetes mellitus with unspecified diabetic retinopathy without macular edema: Secondary | ICD-10-CM

## 2019-02-14 DIAGNOSIS — E559 Vitamin D deficiency, unspecified: Secondary | ICD-10-CM

## 2019-02-14 NOTE — Telephone Encounter (Signed)
-----   Message from Ellamae Sia sent at 02/07/2019  2:47 PM EST ----- Regarding: Lab orders for Wednesday, 11.25.20 Patient is scheduled for CPX labs, please order future labs, Thanks , Karna Christmas

## 2019-02-16 ENCOUNTER — Other Ambulatory Visit: Payer: Self-pay

## 2019-02-16 ENCOUNTER — Ambulatory Visit (INDEPENDENT_AMBULATORY_CARE_PROVIDER_SITE_OTHER): Payer: Medicare Other

## 2019-02-16 ENCOUNTER — Ambulatory Visit: Payer: Medicare Other

## 2019-02-16 ENCOUNTER — Other Ambulatory Visit (INDEPENDENT_AMBULATORY_CARE_PROVIDER_SITE_OTHER): Payer: Medicare Other

## 2019-02-16 DIAGNOSIS — Z0000 Encounter for general adult medical examination without abnormal findings: Secondary | ICD-10-CM | POA: Diagnosis not present

## 2019-02-16 DIAGNOSIS — E11319 Type 2 diabetes mellitus with unspecified diabetic retinopathy without macular edema: Secondary | ICD-10-CM

## 2019-02-16 LAB — COMPREHENSIVE METABOLIC PANEL
ALT: 14 U/L (ref 0–53)
AST: 21 U/L (ref 0–37)
Albumin: 4 g/dL (ref 3.5–5.2)
Alkaline Phosphatase: 79 U/L (ref 39–117)
BUN: 19 mg/dL (ref 6–23)
CO2: 27 mEq/L (ref 19–32)
Calcium: 9.3 mg/dL (ref 8.4–10.5)
Chloride: 104 mEq/L (ref 96–112)
Creatinine, Ser: 0.95 mg/dL (ref 0.40–1.50)
GFR: 76.65 mL/min (ref >60.00)
Glucose, Bld: 111 mg/dL — ABNORMAL HIGH (ref 70–99)
Potassium: 4.2 mEq/L (ref 3.5–5.1)
Sodium: 139 mEq/L (ref 135–145)
Total Bilirubin: 1.1 mg/dL (ref 0.2–1.2)
Total Protein: 7.2 g/dL (ref 6.0–8.3)

## 2019-02-16 LAB — LIPID PANEL
Cholesterol: 126 mg/dL (ref 0–200)
HDL: 41.1 mg/dL (ref >39.00)
LDL Cholesterol: 73 mg/dL (ref 0–99)
NonHDL: 84.45
Total CHOL/HDL Ratio: 3
Triglycerides: 55 mg/dL (ref 0.0–149.0)
VLDL: 11 mg/dL (ref 0.0–40.0)

## 2019-02-16 LAB — HEMOGLOBIN A1C: Hgb A1c MFr Bld: 6.4 % (ref 4.6–6.5)

## 2019-02-16 NOTE — Patient Instructions (Signed)
Dominic Kane , Thank you for taking time to come for your Medicare Wellness Visit. I appreciate your ongoing commitment to your health goals. Please review the following plan we discussed and let me know if I can assist you in the future.   Screening recommendations/referrals: Colonoscopy: Up to date, completed 05/01/2014 Recommended yearly ophthalmology/optometry visit for glaucoma screening and checkup Recommended yearly dental visit for hygiene and checkup  Vaccinations: Influenza vaccine: Up to date, completed 11/17/2018 Pneumococcal vaccine: Completed series Tdap vaccine: Up to date, completed 11/17/2014 Shingles vaccine: will check with insurance    Advanced directives: Please bring a copy of your POA (Power of Raymond) and/or Living Will to your next appointment.   Conditions/risks identified: diabetes, hypertension, hypercholesterolemia  Next appointment: 02/22/2019 @ 2:40 pm   Preventive Care 43 Years and Older, Male Preventive care refers to lifestyle choices and visits with your health care provider that can promote health and wellness. What does preventive care include?  A yearly physical exam. This is also called an annual well check.  Dental exams once or twice a year.  Routine eye exams. Ask your health care provider how often you should have your eyes checked.  Personal lifestyle choices, including:  Daily care of your teeth and gums.  Regular physical activity.  Eating a healthy diet.  Avoiding tobacco and drug use.  Limiting alcohol use.  Practicing safe sex.  Taking low doses of aspirin every day.  Taking vitamin and mineral supplements as recommended by your health care provider. What happens during an annual well check? The services and screenings done by your health care provider during your annual well check will depend on your age, overall health, lifestyle risk factors, and family history of disease. Counseling  Your health care provider may ask  you questions about your:  Alcohol use.  Tobacco use.  Drug use.  Emotional well-being.  Home and relationship well-being.  Sexual activity.  Eating habits.  History of falls.  Memory and ability to understand (cognition).  Work and work Statistician. Screening  You may have the following tests or measurements:  Height, weight, and BMI.  Blood pressure.  Lipid and cholesterol levels. These may be checked every 5 years, or more frequently if you are over 63 years old.  Skin check.  Lung cancer screening. You may have this screening every year starting at age 20 if you have a 30-pack-year history of smoking and currently smoke or have quit within the past 15 years.  Fecal occult blood test (FOBT) of the stool. You may have this test every year starting at age 43.  Flexible sigmoidoscopy or colonoscopy. You may have a sigmoidoscopy every 5 years or a colonoscopy every 10 years starting at age 67.  Prostate cancer screening. Recommendations will vary depending on your family history and other risks.  Hepatitis C blood test.  Hepatitis B blood test.  Sexually transmitted disease (STD) testing.  Diabetes screening. This is done by checking your blood sugar (glucose) after you have not eaten for a while (fasting). You may have this done every 1-3 years.  Abdominal aortic aneurysm (AAA) screening. You may need this if you are a current or former smoker.  Osteoporosis. You may be screened starting at age 84 if you are at high risk. Talk with your health care provider about your test results, treatment options, and if necessary, the need for more tests. Vaccines  Your health care provider may recommend certain vaccines, such as:  Influenza vaccine. This is  recommended every year.  Tetanus, diphtheria, and acellular pertussis (Tdap, Td) vaccine. You may need a Td booster every 10 years.  Zoster vaccine. You may need this after age 68.  Pneumococcal 13-valent conjugate  (PCV13) vaccine. One dose is recommended after age 63.  Pneumococcal polysaccharide (PPSV23) vaccine. One dose is recommended after age 9. Talk to your health care provider about which screenings and vaccines you need and how often you need them. This information is not intended to replace advice given to you by your health care provider. Make sure you discuss any questions you have with your health care provider. Document Released: 04/06/2015 Document Revised: 11/28/2015 Document Reviewed: 01/09/2015 Elsevier Interactive Patient Education  2017 [employer] Prevention in the Home Falls can cause injuries. They can happen to people of all ages. There are many things you can do to make your home safe and to help prevent falls. What can I do on the outside of my home?  Regularly fix the edges of walkways and driveways and fix any cracks.  Remove anything that might make you trip as you walk through a door, such as a raised step or threshold.  Trim any bushes or trees on the path to your home.  Use bright outdoor lighting.  Clear any walking paths of anything that might make someone trip, such as rocks or tools.  Regularly check to see if handrails are loose or broken. Make sure that both sides of any steps have handrails.  Any raised decks and porches should have guardrails on the edges.  Have any leaves, snow, or ice cleared regularly.  Use sand or salt on walking paths during winter.  Clean up any spills in your garage right away. This includes oil or grease spills. What can I do in the bathroom?  Use night lights.  Install grab bars by the toilet and in the tub and shower. Do not use towel bars as grab bars.  Use non-skid mats or decals in the tub or shower.  If you need to sit down in the shower, use a plastic, non-slip stool.  Keep the floor dry. Clean up any water that spills on the floor as soon as it happens.  Remove soap buildup in the tub or shower  regularly.  Attach bath mats securely with double-sided non-slip rug tape.  Do not have throw rugs and other things on the floor that can make you trip. What can I do in the bedroom?  Use night lights.  Make sure that you have a light by your bed that is easy to reach.  Do not use any sheets or blankets that are too big for your bed. They should not hang down onto the floor.  Have a firm chair that has side arms. You can use this for support while you get dressed.  Do not have throw rugs and other things on the floor that can make you trip. What can I do in the kitchen?  Clean up any spills right away.  Avoid walking on wet floors.  Keep items that you use a lot in easy-to-reach places.  If you need to reach something above you, use a strong step stool that has a grab bar.  Keep electrical cords out of the way.  Do not use floor polish or wax that makes floors slippery. If you must use wax, use non-skid floor wax.  Do not have throw rugs and other things on the floor that can make you trip.  What can I do with my stairs?  Do not leave any items on the stairs.  Make sure that there are handrails on both sides of the stairs and use them. Fix handrails that are broken or loose. Make sure that handrails are as long as the stairways.  Check any carpeting to make sure that it is firmly attached to the stairs. Fix any carpet that is loose or worn.  Avoid having throw rugs at the top or bottom of the stairs. If you do have throw rugs, attach them to the floor with carpet tape.  Make sure that you have a light switch at the top of the stairs and the bottom of the stairs. If you do not have them, ask someone to add them for you. What else can I do to help prevent falls?  Wear shoes that:  Do not have high heels.  Have rubber bottoms.  Are comfortable and fit you well.  Are closed at the toe. Do not wear sandals.  If you use a stepladder:  Make sure that it is fully  opened. Do not climb a closed stepladder.  Make sure that both sides of the stepladder are locked into place.  Ask someone to hold it for you, if possible.  Clearly mark and make sure that you can see:  Any grab bars or handrails.  First and last steps.  Where the edge of each step is.  Use tools that help you move around (mobility aids) if they are needed. These include:  Canes.  Walkers.  Scooters.  Crutches.  Turn on the lights when you go into a dark area. Replace any light bulbs as soon as they burn out.  Set up your furniture so you have a clear path. Avoid moving your furniture around.  If any of your floors are uneven, fix them.  If there are any pets around you, be aware of where they are.  Review your medicines with your doctor. Some medicines can make you feel dizzy. This can increase your chance of falling. Ask your doctor what other things that you can do to help prevent falls. This information is not intended to replace advice given to you by your health care provider. Make sure you discuss any questions you have with your health care provider. Document Released: 01/04/2009 Document Revised: 08/16/2015 Document Reviewed: 04/14/2014 Elsevier Interactive Patient Education  2017 [employer].

## 2019-02-16 NOTE — Progress Notes (Signed)
Subjective:   Dominic Kane is a 78 y.o. male who presents for Medicare Annual/Subsequent preventive examination.  Review of Systems: N/A   This visit is being conducted through telemedicine via telephone at the nurse health advisor's home address due to the COVID-19 pandemic. This patient has given me verbal consent via doximity to conduct this visit, patient states they are participating from their home address. Patient and myself are on the telephone call. There is no referral for this visit. Some vital signs may be absent or patient reported.    Patient identification: identified by name, DOB, and current address   Cardiac Risk Factors include: advanced age (>27mn, >>53women);diabetes mellitus;dyslipidemia;male gender;Other (see comment), Risk factor comments: hypercholesterolemia     Objective:    Vitals: There were no vitals taken for this visit.  There is no height or weight on file to calculate BMI.  Advanced Directives 02/16/2019 02/09/2018 01/05/2017 08/05/2016 07/30/2016 03/18/2016 03/18/2016  Does Patient Have a Medical Advance Directive? Yes Yes Yes - Yes Yes Yes  Type of Advance Directive [place] will HHod[place]Living will [place]Living will HCoffee CityLiving will HFloravilleLiving will [place]realLiving will [place]Living will  Does patient want to make changes to medical advance directive? - - - - - No - Patient declined -  Copy of [place] Chart? No - copy requested No - copy requested Yes Yes - No - copy requested -    Tobacco Social History   Tobacco Use  Smoking Status Former Smoker  . Quit date: 07/03/1980  . Years since quitting: 38.6  Smokeless Tobacco Never Used     Counseling given: Not Answered   Clinical Intake:  Pre-visit preparation completed: Yes  Pain : No/denies pain      Nutritional Risks: None Diabetes: Yes CBG done?: No Did pt. bring in CBG monitor from home?: No  How often do you need to have someone help you when you read instructions, pamphlets, or other written materials from your doctor or pharmacy?: 1 - Never What is the last grade level you completed in school?: trade school  Interpreter Needed?: No  Information entered by :: CJohnson, LPN  Past Medical History:  Diagnosis Date  . Abnormality of gait   . Acute sinusitis, unspecified   . Allergy   . 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 Aortic valve disorders   . Arthritis    Osteoarthritis-bilateral knees, lower back issues occasionaly related to knee issues  . Benign paroxysmal positional vertigo    not in a long time  . Cataract    resolved  . CHF (congestive heart failure) ([place]   . Coronary atherosclerosis of native coronary artery   . Diabetes mellitus without complication ([place]    Diet control only.  .Marland KitchenDisplacement of lumbar intervertebral disc without myelopathy   . Diverticulosis of colon (without mention of hemorrhage)   . Dysfunction of eustachian tube    Hard of hearing"bilateral hearing aids"  . Enlarged prostate   . GERD (gastroesophageal reflux disease)   . Headache(784.0)   . History of kidney stones    past hx. 15 yrs ago x1  . Hypertrophy of prostate without urinary obstruction and other lower urinary tract symptoms (LUTS)   . Lipoprotein deficiencies   . Other and unspecified hyperlipidemia   . Other primary cardiomyopathies   . Pain in  joint, lower leg   . Personal history of colonic polyps   . Personal history of other diseases of digestive system   . Pes anserinus tendinitis or bursitis    left shoulder remains an issue  . Sinoatrial node dysfunction (HCC)    [name] follows  . 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 Thoracic or lumbosacral neuritis or radiculitis, unspecified   . Unspecified disorder of skin and  subcutaneous tissue   . Unspecified essential hypertension   . Unspecified vitamin D deficiency    Past Surgical History:  Procedure Laterality Date  . BASAL CELL CARCINOMA EXCISION  11/2016   Dr. Nevada Crane  . CATARACT EXTRACTION, BILATERAL    . COLONOSCOPY W/ POLYPECTOMY    . DOBUTAMINE STRESS ECHO  1/07   Lateral hypokinesis but no ischemia  . ESOPHAGOGASTRODUODENOSCOPY  11/07   Schatzki's ring, non bleeding erosive gastropathy Charlotte Crawford  . KNEE SURGERY Left 1947  . LYMPH NODE DISSECTION N/A 08/05/2016   Procedure: LIMITED LYMPH NODE DISSECTION;  Surgeon: Armandina Gemma, MD;  Location: [facility];  Service: General;  Laterality: N/A;  . Pulmonary functioning tests  1. 2003  2. 2005   1. Diminished lung capacity  2. Stable  . THYROIDECTOMY N/A 08/05/2016   Procedure: TOTAL THYROIDECTOMY;  Surgeon: Armandina Gemma, MD;  Location: [facility];  Service: General;  Laterality: N/A;  . TONSILLECTOMY    . TOTAL KNEE ARTHROPLASTY Left 03/18/2016   Procedure: LEFT TOTAL KNEE ARTHROPLASTY;  Surgeon: Paralee Cancel, MD;  Location: [facility];  Service: Orthopedics;  Laterality: Left;   Family History  Problem Relation Age of Onset  . Heart failure Mother   . Coronary artery disease Mother   . Stroke Mother   . Hypothyroidism Mother   . Cancer Father        LUNG  . Cancer Sister        BREAST  . Heart failure Maternal Grandmother   . Hypothyroidism Daughter   . Colon cancer Neg Hx    Social History   Socioeconomic History  . Marital status: Married    Spouse name: Not on file  . Number of children: 1  . Years of education: Not on file  . Highest education level: Not on file  Occupational History  . 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 Financial resource strain: Not hard at all  . Food insecurity    Worry: Never true    Inability: Never true  . Transportation needs    Medical: No    Non-medical: No  Tobacco Use  . Smoking status: Former Smoker    Quit  date: 07/03/1980    Years since quitting: 38.6  . Smokeless tobacco: Never Used  Substance and Sexual Activity  . Alcohol use: Yes    Alcohol/week: 0.0 standard drinks    Comment: occassionally  . Drug use: No  . Sexual activity: Not on file  Lifestyle  . Physical activity    Days per week: 0 days    Minutes per session: 0 min  . Stress: To some extent  Relationships  . Social Herbalist on phone: Not on file    Gets together: Not on file    Attends religious service: Not on file    Active member of club or organization: Not on file    Attends meetings of clubs or organizations: Not on file    Relationship status: Not on  file  Other Topics Concern  . Not on file  Social History Narrative  . Not on file    Outpatient Encounter Medications as of 02/16/2019  Medication Sig  . aspirin EC 81 MG tablet Take 81 mg by mouth daily.  . Blood Glucose Monitoring Suppl (ONE TOUCH ULTRA 2) w/Device KIT Use to check blood sugar daily.  . cetirizine (ZYRTEC) 10 MG tablet Take 10 mg by mouth daily.  . Cholecalciferol (D3 ADULT PO) Take by mouth.  . finasteride (PROSCAR) 5 MG tablet TAKE 1 TABLET DAILY  . furosemide (LASIX) 40 MG tablet Take 1 tablet (40 mg total) by mouth daily.  [name] glucose blood (ONE TOUCH ULTRA TEST) test strip Use to check blood sugar two times a day. Dx: E11.9 (Patient taking differently: Use to check blood sugar every other day)  . levothyroxine (SYNTHROID, LEVOTHROID) 150 MCG tablet Take 1 tablet (150 mcg total) by mouth daily before breakfast.  . Melatonin 5 MG TABS Take 5 mg by mouth at bedtime.   [name] omeprazole (PRILOSEC) 40 MG capsule TAKE 1 CAPSULE DAILY  . ONETOUCH DELICA LANCETS 63Z MISC Use to check blood sugar two times a day. Dx: E11.9  . PRESCRIPTION MEDICATION every 4 (four) months. *Antibiotic Injection in Eye*  . ramipril (ALTACE) 2.5 MG capsule Take 1 capsule (2.5 mg total) by mouth daily.  . rosuvastatin (CRESTOR) 10 MG tablet TAKE 1 TABLET DAILY   . terazosin (HYTRIN) 10 MG capsule TAKE 1 CAPSULE DAILY   No facility-administered encounter medications on file as of 02/16/2019.     Activities of Daily Living In your present state of health, do you have any difficulty performing the following activities: 02/16/2019  Hearing? Y  Comment wears hearing aids  Vision? Y  Comment has AMD  Difficulty concentrating or making decisions? Y  Comment trouble recalling things  Walking or climbing stairs? N  Dressing or bathing? N  Doing errands, shopping? N  Preparing Food and eating ? N  Using the Toilet? N  In the past six months, have you accidently leaked urine? Y  Comment sometimes  Do you have problems with loss of bowel control? N  Managing your Medications? N  Managing your Finances? N  Housekeeping or managing your Housekeeping? N  Some recent data might be hidden    Patient Care Team: Jinny Sanders, MD as PCP - General Burnell Blanks, MD as PCP - Cardiology (Cardiology)   Assessment:   This is a routine wellness examination for Gwyndolyn Saxon.  Exercise Activities and Dietary recommendations Current Exercise Habits: The patient has a physically strenuous job, but has no regular exercise apart from work., Exercise limited by: None identified  Goals    . Health management     Starting 02/09/2018, I will continue to walk 5-6 hours daily, drink 9-10 glasses of water daily, and to take medications as prescribed.     . Patient Stated     02/16/2019, I will try to keep glucose in normal range and lose some weight.        Fall Risk Fall Risk  02/16/2019 03/16/2018 02/10/2018 02/09/2018 01/05/2017  Falls in the past year? 1 - 1 1 No  Comment moving backwards and fell - Emmi Telephone Survey: data to providers prior to load - -  Number falls in past yr: 1 - 1 0 -  Comment - - Emmi Telephone Survey Actual Response = 1 - -  Injury with Fall? 0 - 1 1 -  Risk for fall due to : Medication side effect History of  fall(s);Impaired balance/gait - - -  Follow up Falls evaluation completed;Falls prevention discussed Falls evaluation completed;Education provided;Falls prevention discussed - - -   Is the patient's home free of loose throw rugs in walkways, pet beds, electrical cords, etc?   yes      Grab bars in the bathroom? yes      Handrails on the stairs?   yes      Adequate lighting?   yes  Timed Get Up and Go Performed: N/A  Depression Screen PHQ 2/9 Scores 02/16/2019 02/09/2018 01/05/2017 10/19/2015  PHQ - 2 Score 0 0 0 0  PHQ- 9 Score 0 0 0 -    Cognitive Function MMSE - Mini Mental State Exam 02/16/2019 02/09/2018 01/05/2017  Orientation to time _0 Orientation to Place _1 Registration _2 Attention/ Calculation 3 0 0  Recall _3 Language- name 2 objects - 0 0  Language- repeat _4 Language- follow 3 step command - 3 3  Language- read & follow direction - 0 0  Write a sentence - 0 0  Copy design - 0 0  Total score - 20 20  Mini Cog  Mini-Cog screen was completed. Maximum score is 22. A value of 0 denotes this part of the MMSE was not completed or the patient failed this part of the Mini-Cog screening.      Immunization History  Administered Date(s) Administered  . Fluad Quad(high Dose 65+) 11/17/2018  . Influenza Whole 12/23/2006, 12/09/2007, 12/22/2008  . Influenza, High Dose Seasonal PF 11/17/2014, 12/12/2015, 12/18/2016, 12/22/2017  . Influenza,inj,Quad PF,6+ Mos 01/06/2013  . Influenza-Unspecified 11/22/2013  . Pneumococcal Conjugate-13 10/12/2014  . Pneumococcal Polysaccharide-23 12/23/2006  . Td 03/24/2001  . Tdap 11/17/2014  . Zoster 06/23/2014    Qualifies for Shingles Vaccine? Yes  Screening Tests Health Maintenance  Topic Date Due  . HEMOGLOBIN A1C  02/02/2019  . FOOT EXAM  02/13/2019  . COLONOSCOPY  05/02/2019  . OPHTHALMOLOGY EXAM  05/04/2019  . TETANUS/TDAP  11/16/2024  . INFLUENZA VACCINE  Completed  . PNA vac Low Risk Adult   Completed   Cancer Screenings: Lung: Low Dose CT Chest recommended if Age 80-80 years, 30 pack-year currently smoking OR have quit w/in 15years. Patient does not qualify. Colorectal: completed 05/01/2014  Additional Screenings:  Hepatitis C Screening: N/A      Plan:   Patient will manage his glucose levels and try to lose some weight.   I have personally reviewed and noted the following in the patient's chart:   . Medical and social history . Use of alcohol, tobacco or illicit drugs  . Current medications and supplements . Functional ability and status . Nutritional status . Physical activity . Advanced directives . List of other physicians . Hospitalizations, surgeries, and ER visits in previous 12 months . Vitals . Screenings to include cognitive, depression, and falls . Referrals and appointments  In addition, I have reviewed and discussed with patient certain preventive protocols, quality metrics, and best practice recommendations. A written personalized care plan for preventive services as well as general preventive health recommendations were provided to patient.     Andrez Grime, LPN  [id number]

## 2019-02-16 NOTE — Progress Notes (Signed)
PCP notes:  Health Maintenance: Will check with insurance about shingrix   Abnormal Screenings: none   Patient concerns: none   Nurse concerns: none   Next PCP appt.: 02/22/2019 @ 2:40 pm

## 2019-02-22 ENCOUNTER — Encounter: Payer: Self-pay | Admitting: Family Medicine

## 2019-02-22 ENCOUNTER — Ambulatory Visit (INDEPENDENT_AMBULATORY_CARE_PROVIDER_SITE_OTHER): Payer: Medicare Other | Admitting: Family Medicine

## 2019-02-22 ENCOUNTER — Other Ambulatory Visit: Payer: Self-pay

## 2019-02-22 VITALS — BP 110/62 | HR 63 | Temp 98.3°F | Ht 69.75 in | Wt 205.5 lb

## 2019-02-22 DIAGNOSIS — E11319 Type 2 diabetes mellitus with unspecified diabetic retinopathy without macular edema: Secondary | ICD-10-CM | POA: Diagnosis not present

## 2019-02-22 DIAGNOSIS — E1340 Other specified diabetes mellitus with diabetic neuropathy, unspecified: Secondary | ICD-10-CM | POA: Diagnosis not present

## 2019-02-22 DIAGNOSIS — I10 Essential (primary) hypertension: Secondary | ICD-10-CM

## 2019-02-22 DIAGNOSIS — I2510 Atherosclerotic heart disease of native coronary artery without angina pectoris: Secondary | ICD-10-CM

## 2019-02-22 DIAGNOSIS — C73 Malignant neoplasm of thyroid gland: Secondary | ICD-10-CM | POA: Diagnosis not present

## 2019-02-22 DIAGNOSIS — I509 Heart failure, unspecified: Secondary | ICD-10-CM

## 2019-02-22 DIAGNOSIS — E7800 Pure hypercholesterolemia, unspecified: Secondary | ICD-10-CM | POA: Diagnosis not present

## 2019-02-22 LAB — HM DIABETES FOOT EXAM

## 2019-02-22 NOTE — Assessment & Plan Note (Signed)
Followed by ENDO 

## 2019-02-22 NOTE — Progress Notes (Signed)
Chief Complaint  Patient presents with  . Annual Exam    Part 2    History of Present Illness: HPI  The patient presents for review of chronic health problems. He/She also has the following acute concerns today:  [place]  The patient saw a LPN or RN for medicare wellness visit.  Prevention and wellness was reviewed in detail. Note reviewed and important notes copied below. Health Maintenance: Will check with insurance about shingrix Abnormal Screenings: None  02/22/19  Hypertension:    At goal on ramipril and with lasix BP Readings from Last 3 Encounters:  02/22/19 110/62  08/13/18 114/69  07/12/18 123/69  Using medication without problems or lightheadedness: none Chest pain with exertion:none Edema:none Short of breath:none Average home BPs: Other issues:  Elevated Cholesterol:  LDL almost at goal < 70 on crestor Lab Results  Component Value Date   CHOL 126 02/16/2019   HDL 41.10 02/16/2019   LDLCALC 73 02/16/2019   TRIG 55.0 02/16/2019   CHOLHDL 3 02/16/2019  Using medications without problems: Muscle aches:  Diet compliance: healthy, smaller portions Exercise: walking Other complaints: CAD    Wt Readings from Last 3 Encounters:  02/22/19 205 lb 8 oz (93.2 kg)  08/13/18 207 lb (93.9 kg)  07/12/18 206 lb (93.4 kg)     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 Lab Results  Component Value Date   HGBA1C 6.4 02/16/2019  Using medications without difficulties: Hypoglycemic episodes: Hyperglycemic episodes: Feet problems: neuropathy Blood Sugars averaging: FBS 98-103 eye exam within last year: 05/03/2018   Per pt told skin lesions on  Bilateral upper thighs due to medication per Derm, Dr. Nevada Crane... Given I am not sure what he is referring to , I will obtain his OV note. Minimal symptoms, not told to follow up.  This visit occurred during the  SARS-CoV-2 public health emergency.  Safety protocols were in place, including screening questions prior to the visit, additional usage of staff PPE, and extensive cleaning of exam room while observing appropriate contact time as indicated for disinfecting solutions.   COVID 19 screen:  No recent travel or known exposure to COVID19 The patient denies respiratory symptoms of COVID 19 at this time. The importance of social distancing was discussed today.     Review of Systems  Constitutional: Positive for malaise/fatigue. Negative for chills and fever.  HENT: Negative for congestion and ear pain.   Eyes: Negative for pain and redness.  Respiratory: Negative for cough and shortness of breath.   Cardiovascular: Negative for chest pain, palpitations and leg swelling.  Gastrointestinal: Negative for abdominal pain, blood in stool, constipation, diarrhea, nausea and vomiting.  Genitourinary: Negative for dysuria.  Musculoskeletal: Positive for myalgias. Negative for falls.  Skin: Negative for rash.  Neurological: Negative for dizziness.  Psychiatric/Behavioral: Negative for depression. The patient is not nervous/anxious.       Past Medical History:  Diagnosis Date  . Abnormality of gait   . Acute sinusitis, unspecified   . Allergy   . 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 Aortic valve disorders   . Arthritis    Osteoarthritis-bilateral knees, lower back issues occasionaly related to knee issues  . Benign paroxysmal positional vertigo    not in a long time  . Cataract    resolved  . CHF (congestive heart failure) ([place])   . Coronary  atherosclerosis of native coronary artery   . Diabetes mellitus without complication ([place])    Diet control only.  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 Diverticulosis of colon (without mention of hemorrhage)   . Dysfunction of eustachian tube    Hard of hearing"bilateral hearing aids"  . Enlarged prostate    . GERD (gastroesophageal reflux disease)   . Headache(784.0)   . History of kidney stones    past hx. 15 yrs ago x1  . Hypertrophy of prostate without urinary obstruction and other lower urinary tract symptoms (LUTS)   . Lipoprotein deficiencies   . Other and unspecified hyperlipidemia   . Other primary cardiomyopathies   . Pain in joint, lower leg   . Personal history of colonic polyps   . Personal history of other diseases of digestive system   . Pes anserinus tendinitis or bursitis    left shoulder remains an issue  . Sinoatrial node dysfunction (HCC)    [name] follows  . 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 Thoracic or lumbosacral neuritis or radiculitis, unspecified   . Unspecified disorder of skin and subcutaneous tissue   . Unspecified essential hypertension   . Unspecified vitamin D deficiency     reports that he quit smoking about 38 years ago. He has never used smokeless tobacco. He reports current alcohol use. He reports that he does not use drugs.   Current Outpatient Medications:  .  aspirin EC 81 MG tablet, Take 81 mg by mouth daily., Disp: , Rfl:  .  Blood Glucose Monitoring Suppl (ONE TOUCH ULTRA 2) w/Device KIT, Use to check blood sugar daily., Disp: 1 each, Rfl: 0 .  cetirizine (ZYRTEC) 10 MG tablet, Take 10 mg by mouth daily., Disp: , Rfl:  .  Cholecalciferol (D3 ADULT PO), Take by mouth., Disp: , Rfl:  .  finasteride (PROSCAR) 5 MG tablet, TAKE 1 TABLET DAILY, Disp: 90 tablet, Rfl: 1 .  furosemide (LASIX) 40 MG tablet, Take 1 tablet (40 mg total) by mouth daily., Disp: 90 tablet, Rfl: 2 .  glucose blood (ONE TOUCH ULTRA TEST) test strip, Use to check blood sugar two times a day. Dx: E11.9 (Patient taking differently: Use to check blood sugar every other day), Disp: 200 each, Rfl: 3 .  levothyroxine (SYNTHROID, LEVOTHROID) 150 MCG tablet, Take 1 tablet (150 mcg total) by mouth daily before breakfast., Disp: 90 tablet, Rfl: 3 .  Melatonin 5 MG  TABS, Take 5 mg by mouth at bedtime. , Disp: , Rfl:  .  omeprazole (PRILOSEC) 40 MG capsule, TAKE 1 CAPSULE DAILY, Disp: 90 capsule, Rfl: 1 .  ONETOUCH DELICA LANCETS 25Z MISC, Use to check blood sugar two times a day. Dx: E11.9, Disp: 200 each, Rfl: 3 .  PRESCRIPTION MEDICATION, every 4 (four) months. *Antibiotic Injection in Eye*, Disp: , Rfl:  .  ramipril (ALTACE) 2.5 MG capsule, Take 1 capsule (2.5 mg total) by mouth daily., Disp: 10 capsule, Rfl: 0 .  rosuvastatin (CRESTOR) 10 MG tablet, TAKE 1 TABLET DAILY, Disp: 90 tablet, Rfl: 2 .  terazosin (HYTRIN) 10 MG capsule, TAKE 1 CAPSULE DAILY, Disp: 90 capsule, Rfl: 0   Observations/Objective: Blood pressure 110/62, pulse 63, temperature 98.3 F (36.8 C), temperature source Temporal, height 5' 9.75" (1.772 m), weight 205 lb 8 oz (93.2 kg), SpO2 96 %.  Physical Exam Constitutional:      General: He is not in acute distress.    Appearance: Normal appearance.  He is well-developed. He is not ill-appearing or toxic-appearing.  HENT:     Head: Normocephalic and atraumatic.     Right Ear: Hearing, tympanic membrane, ear canal and external ear normal.     Left Ear: Hearing, tympanic membrane, ear canal and external ear normal.     Nose: Nose normal.     Mouth/Throat:     Pharynx: Uvula midline.  Eyes:     General: Lids are normal. Lids are everted, no foreign bodies appreciated.     Conjunctiva/sclera: Conjunctivae normal.     Pupils: Pupils are equal, round, and reactive to light.  Neck:     Musculoskeletal: Normal range of motion and neck supple.     Thyroid: No thyroid mass or thyromegaly.     Vascular: No carotid bruit.     Trachea: Trachea and phonation normal.  Cardiovascular:     Rate and Rhythm: Normal rate and regular rhythm.     Pulses: Normal pulses.     Heart sounds: S1 normal and S2 normal. No murmur. No gallop.   Pulmonary:     Breath sounds: Normal breath sounds. No wheezing, rhonchi or rales.  Abdominal:     General:  Bowel sounds are normal.     Palpations: Abdomen is soft.     Tenderness: There is no abdominal tenderness. There is no guarding or rebound.     Hernia: No hernia is present.  Lymphadenopathy:     Cervical: No cervical adenopathy.  Skin:    General: Skin is warm and dry.     Findings: No rash.     Comments:  Large patches, red bilateral upper thighs.. no flake, slight increase in warmth.  Neurological:     Mental Status: He is alert.     Cranial Nerves: No cranial nerve deficit.     Sensory: No sensory deficit.     Gait: Gait normal.     Deep Tendon Reflexes: Reflexes are normal and symmetric.  Psychiatric:        Speech: Speech normal.        Behavior: Behavior normal.        Judgment: Judgment normal.      Diabetic foot exam: Normal inspection No skin breakdown No calluses  Normal DP pulses Normal sensation to light touch and monofilament Nails normal  Assessment and Plan The patient's preventative maintenance and recommended screening tests for an annual wellness exam were reviewed in full today. Brought up to date unless services declined.  Counselled on the importance of diet, exercise, and its role in overall health and mortality. The patient's FH and SH was reviewed, including their home life, tobacco status, and drug and alcohol status.   Vaccines:Uptodate  Colon:2016 3 adenomas, Dr. Carlean Purl, repeat in 5 years . Former smoker 20 pipe, quit 40 years ago. Prostate eval not indicated.   Essential hypertension, benign Well controlled. Continue current medication.   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  Pure hypercholesterolemia LDL almost at goal < 70 on statin.  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  DM retinopathy ([place]) Opthamology  Following.  CHF (NYHA class II, ACC/AHA stage C) Euvolemic on lasix. Clear lung eval.   Eliezer Lofts,  MD

## 2019-02-22 NOTE — Assessment & Plan Note (Signed)
Euvolemic on lasix. Clear lung eval.

## 2019-02-22 NOTE — Progress Notes (Signed)
No critical labs need to be addressed urgently. We will discuss labs in detail at upcoming office visit.   

## 2019-02-22 NOTE — Assessment & Plan Note (Signed)
Diet controlled.  

## 2019-02-22 NOTE — Assessment & Plan Note (Signed)
Well controlled. Continue current medication.  

## 2019-02-22 NOTE — Patient Instructions (Signed)
Work on [employer] and regular exercise.  We will get records from the dermatologist to determine if anything needs to be done with medications regarding the thigh rash.

## 2019-02-22 NOTE — Assessment & Plan Note (Signed)
LDL almost at goal < 70 on statin.

## 2019-02-22 NOTE — Assessment & Plan Note (Signed)
Stable control. 

## 2019-02-22 NOTE — Assessment & Plan Note (Signed)
Opthamology  Following.

## 2019-03-08 ENCOUNTER — Other Ambulatory Visit: Payer: Self-pay | Admitting: Family Medicine

## 2019-05-03 ENCOUNTER — Other Ambulatory Visit: Payer: Self-pay

## 2019-05-03 MED ORDER — FINASTERIDE 5 MG PO TABS: 5.0000 mg | ORAL_TABLET | Freq: Every day | ORAL | 1 refills | Status: DC

## 2019-05-03 NOTE — Telephone Encounter (Signed)
Patient is requesting a refill on Finasteride. Last refilled 10/20/18 for #90 with 1 refill.  Patient was last here for AWV 02/16/19.  Patient has 6 month follow up scheduled in June. Is this ok to refill?

## 2019-05-04 NOTE — Telephone Encounter (Signed)
Pt called this morning to ck on status of finasteride; pt still has med. I advised pt med was approved on 05/03/19 to [facility] and pt will ck with pharmacy later today or tomorrow to make sure pharmacy received refill. Nothing further needed at this time.

## 2019-05-16 ENCOUNTER — Other Ambulatory Visit: Payer: Self-pay

## 2019-05-16 ENCOUNTER — Encounter (INDEPENDENT_AMBULATORY_CARE_PROVIDER_SITE_OTHER): Payer: Medicare Other | Admitting: Ophthalmology

## 2019-05-16 DIAGNOSIS — H34811 Central retinal vein occlusion, right eye, with macular edema: Secondary | ICD-10-CM | POA: Diagnosis not present

## 2019-05-16 DIAGNOSIS — H35033 Hypertensive retinopathy, bilateral: Secondary | ICD-10-CM | POA: Diagnosis not present

## 2019-05-16 DIAGNOSIS — I10 Essential (primary) hypertension: Secondary | ICD-10-CM

## 2019-05-16 DIAGNOSIS — H35372 Puckering of macula, left eye: Secondary | ICD-10-CM | POA: Diagnosis not present

## 2019-05-16 DIAGNOSIS — H43813 Vitreous degeneration, bilateral: Secondary | ICD-10-CM

## 2019-05-16 LAB — HM DIABETES EYE EXAM

## 2019-05-17 ENCOUNTER — Encounter: Payer: Self-pay | Admitting: Family Medicine

## 2019-06-03 ENCOUNTER — Encounter: Payer: Self-pay | Admitting: Family Medicine

## 2019-06-03 ENCOUNTER — Other Ambulatory Visit: Payer: Self-pay

## 2019-06-03 ENCOUNTER — Ambulatory Visit (INDEPENDENT_AMBULATORY_CARE_PROVIDER_SITE_OTHER): Payer: Medicare Other | Admitting: Family Medicine

## 2019-06-03 VITALS — BP 130/62 | HR 73 | Temp 98.9°F | Ht 69.75 in | Wt 209.2 lb

## 2019-06-03 DIAGNOSIS — E11319 Type 2 diabetes mellitus with unspecified diabetic retinopathy without macular edema: Secondary | ICD-10-CM

## 2019-06-03 DIAGNOSIS — R0789 Other chest pain: Secondary | ICD-10-CM

## 2019-06-03 DIAGNOSIS — M5416 Radiculopathy, lumbar region: Secondary | ICD-10-CM | POA: Diagnosis not present

## 2019-06-03 MED ORDER — PREDNISONE 10 MG PO TABS: ORAL_TABLET | ORAL | 0 refills | Status: DC

## 2019-06-03 NOTE — Patient Instructions (Addendum)
Start home physical therapy. Heat on low back.  Complete prednisone taper.  Call if not improving as expected in 2 weeks.  No heavy lifting > 10 lbs , no repetitive twisting, pulling or pushing.

## 2019-06-03 NOTE — Assessment & Plan Note (Signed)
Treat with low dose prednisone taper ( given DM), heat and low back home physical therapy.

## 2019-06-03 NOTE — Assessment & Plan Note (Signed)
Diet controlled.. follow closely while on steroids.

## 2019-06-03 NOTE — Progress Notes (Signed)
Chief Complaint  Patient presents with  . Back Pain  . Sciatica  . Chest Pain  . Shortness of Breath    History of Present Illness: HPI   79 year old male presents with new onset low back pain. He reports 5 days of  Low back pain.. more so left.. radiating down left leg. Worse in last 2 days. No new weakness.  He does have some numbness in left foot.  Tylenol does not help much.  No proceeding fall or injury.   At work he is bending twisting stretching at work.   He also reports 1 month of chest pain.. ...started back to work pushing and pulling  Heavy items ( 300 lBs going up incline)..  When moving these large items... sharp pulling sharp pain on left upper chest. Short of breath with the chest pain  Has seemed to improving since he has not had to do as much of this activity at work. Has appt  With Dr. Glennie Hawk on 4/28  Hx of thoracic aortic aneurysm, DM, CHF, cardiomyopathy, CAD   No past back surgery or injections. No past X-ray of low back.   FBS 90-117  This visit occurred during the SARS-CoV-2 public health emergency.  Safety protocols were in place, including screening questions prior to the visit, additional usage of staff PPE, and extensive cleaning of exam room while observing appropriate contact time as indicated for disinfecting solutions.   COVID 19 screen:  No recent travel or known exposure to COVID19 The patient denies respiratory symptoms of COVID 19 at this time. The importance of social distancing was discussed today.     Review of Systems  Constitutional: Negative for chills and fever.  HENT: Negative for congestion and ear pain.   Eyes: Negative for pain and redness.  Respiratory: Negative for cough and shortness of breath.   Cardiovascular: Negative for chest pain, palpitations and leg swelling.  Gastrointestinal: Negative for abdominal pain, blood in stool, constipation, diarrhea, nausea and vomiting.  Genitourinary: Negative for dysuria.   Musculoskeletal: Negative for falls and myalgias.  Skin: Negative for rash.  Neurological: Negative for dizziness.  Psychiatric/Behavioral: Negative for depression. The patient is not nervous/anxious.       Past Medical History:  Diagnosis Date  . Abnormality of gait   . Acute sinusitis, unspecified   . Allergy   . 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 Aortic valve disorders   . Arthritis    Osteoarthritis-bilateral knees, lower back issues occasionaly related to knee issues  . Benign paroxysmal positional vertigo    not in a long time  . Cataract    resolved  . CHF (congestive heart failure) ([place])   . Coronary atherosclerosis of native coronary artery   . Diabetes mellitus without complication ([place])    Diet control only.  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 Diverticulosis of colon (without mention of hemorrhage)   . Dysfunction of eustachian tube    Hard of hearing"bilateral hearing aids"  . Enlarged prostate   . GERD (gastroesophageal reflux disease)   . Headache(784.0)   . History of kidney stones    past hx. 15 yrs ago x1  . Hypertrophy of prostate without urinary obstruction and other lower urinary tract symptoms (LUTS)   . Lipoprotein deficiencies   . Other and unspecified hyperlipidemia   . Other primary cardiomyopathies   . Pain in joint, lower leg   . Personal history of colonic  polyps   . Personal history of other diseases of digestive system   . Pes anserinus tendinitis or bursitis    left shoulder remains an issue  . Sinoatrial node dysfunction (HCC)    [name] follows  . 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 Thoracic or lumbosacral neuritis or radiculitis, unspecified   . Unspecified disorder of skin and subcutaneous tissue   . Unspecified essential hypertension   . Unspecified vitamin D deficiency     reports that he quit smoking about 38 years ago. He has never used smokeless tobacco.  He reports current alcohol use. He reports that he does not use drugs.   Current Outpatient Medications:  .  aspirin EC 81 MG tablet, Take 81 mg by mouth daily., Disp: , Rfl:  .  Blood Glucose Monitoring Suppl (ONE TOUCH ULTRA 2) w/Device KIT, Use to check blood sugar daily., Disp: 1 each, Rfl: 0 .  cetirizine (ZYRTEC) 10 MG tablet, Take 10 mg by mouth daily., Disp: , Rfl:  .  Cholecalciferol (D3 ADULT PO), Take by mouth., Disp: , Rfl:  .  finasteride (PROSCAR) 5 MG tablet, Take 1 tablet (5 mg total) by mouth daily., Disp: 90 tablet, Rfl: 1 .  furosemide (LASIX) 40 MG tablet, Take 1 tablet (40 mg total) by mouth daily., Disp: 90 tablet, Rfl: 2 .  glucose blood (ONE TOUCH ULTRA TEST) test strip, Use to check blood sugar two times a day. Dx: E11.9 (Patient taking differently: Use to check blood sugar every other day), Disp: 200 each, Rfl: 3 .  levothyroxine (SYNTHROID, LEVOTHROID) 150 MCG tablet, Take 1 tablet (150 mcg total) by mouth daily before breakfast., Disp: 90 tablet, Rfl: 3 .  Melatonin 5 MG TABS, Take 5 mg by mouth at bedtime. , Disp: , Rfl:  .  omeprazole (PRILOSEC) 40 MG capsule, TAKE 1 CAPSULE DAILY, Disp: 90 capsule, Rfl: 3 .  ONETOUCH DELICA LANCETS 53I MISC, Use to check blood sugar two times a day. Dx: E11.9, Disp: 200 each, Rfl: 3 .  PRESCRIPTION MEDICATION, every 4 (four) months. *Antibiotic Injection in Eye*, Disp: , Rfl:  .  ramipril (ALTACE) 2.5 MG capsule, Take 1 capsule (2.5 mg total) by mouth daily., Disp: 10 capsule, Rfl: 0 .  rosuvastatin (CRESTOR) 10 MG tablet, TAKE 1 TABLET DAILY, Disp: 90 tablet, Rfl: 2 .  terazosin (HYTRIN) 10 MG capsule, TAKE 1 CAPSULE DAILY, Disp: 90 capsule, Rfl: 3   Observations/Objective: Blood pressure 130/62, pulse 73, temperature 98.9 F (37.2 C), temperature source Oral, height 5' 9.75" (1.772 m), weight 209 lb 4 oz (94.9 kg), SpO2 91 %.  Physical Exam Constitutional:      Appearance: He is well-developed.  HENT:     Head:  Normocephalic.     Right Ear: Hearing normal.     Left Ear: Hearing normal.     Nose: Nose normal.  Neck:     Thyroid: No thyroid mass or thyromegaly.     Vascular: No carotid bruit.     Trachea: Trachea normal.  Cardiovascular:     Rate and Rhythm: Normal rate and regular rhythm.     Pulses: Normal pulses.     Heart sounds: Heart sounds not distant. No murmur. No friction rub. No gallop.      Comments: No peripheral edema Pulmonary:     Effort: Pulmonary effort is normal. No respiratory distress.     Breath sounds: Normal breath sounds.  Musculoskeletal:     Thoracic back: Tenderness  present.     Lumbar back: Tenderness and bony tenderness present. Decreased range of motion. Positive left straight leg raise test. Negative right straight leg raise test.     Right lower leg: Normal. No edema.     Left lower leg: Normal. No edema.  Skin:    General: Skin is warm and dry.     Findings: No rash.  Psychiatric:        Speech: Speech normal.        Behavior: Behavior normal.        Thought Content: Thought content normal.     EKG: unchanged from previous tracings, RBBB.  Assessment and Plan   Atypical chest pain Most likely musculoskeletal in origin but given pain was exertional.. eval with EKG. EKG today unchanged from previous.  Pain now resolved and pt no longer doing such heay pushing/pulling at work.   Lumbar back pain with radiculopathy affecting left lower extremity Treat with low dose prednisone taper ( given DM), heat and low back home physical therapy.   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 follow closely while on steroids.     Eliezer Lofts, MD

## 2019-06-03 NOTE — Assessment & Plan Note (Signed)
Most likely musculoskeletal in origin but given pain was exertional.. eval with EKG. EKG today unchanged from previous.  Pain now resolved and pt no longer doing such heay pushing/pulling at work.

## 2019-06-19 ENCOUNTER — Other Ambulatory Visit: Payer: Self-pay | Admitting: Endocrinology

## 2019-06-19 DIAGNOSIS — E042 Nontoxic multinodular goiter: Secondary | ICD-10-CM

## 2019-06-27 ENCOUNTER — Other Ambulatory Visit: Payer: Self-pay | Admitting: Endocrinology

## 2019-06-27 DIAGNOSIS — E042 Nontoxic multinodular goiter: Secondary | ICD-10-CM

## 2019-06-29 ENCOUNTER — Ambulatory Visit (INDEPENDENT_AMBULATORY_CARE_PROVIDER_SITE_OTHER): Payer: Medicare Other | Admitting: Endocrinology

## 2019-06-29 ENCOUNTER — Encounter: Payer: Self-pay | Admitting: Endocrinology

## 2019-06-29 ENCOUNTER — Other Ambulatory Visit: Payer: Self-pay

## 2019-06-29 VITALS — BP 100/60 | HR 64 | Ht 69.75 in | Wt 207.0 lb

## 2019-06-29 DIAGNOSIS — E042 Nontoxic multinodular goiter: Secondary | ICD-10-CM | POA: Diagnosis not present

## 2019-06-29 DIAGNOSIS — E890 Postprocedural hypothyroidism: Secondary | ICD-10-CM | POA: Diagnosis not present

## 2019-06-29 DIAGNOSIS — E11319 Type 2 diabetes mellitus with unspecified diabetic retinopathy without macular edema: Secondary | ICD-10-CM

## 2019-06-29 DIAGNOSIS — C73 Malignant neoplasm of thyroid gland: Secondary | ICD-10-CM

## 2019-06-29 LAB — HEMOGLOBIN A1C: Hgb A1c MFr Bld: 6.4 % (ref 4.6–6.5)

## 2019-06-29 LAB — TSH: TSH: 1.88 u[IU]/mL (ref 0.35–4.50)

## 2019-06-29 LAB — T4, FREE: Free T4: 0.97 ng/dL (ref 0.60–1.60)

## 2019-06-29 NOTE — Patient Instructions (Addendum)
Blood tests are requested for you today.  We'll let you know about the results.  If the blood sugar is high, i'll prescribe a pill called "metformin."   Please come back for a follow-up appointment in 6 months.

## 2019-06-29 NOTE — Progress Notes (Signed)
Subjective:    Patient ID: Dominic Kane, male    DOB: [birth year], 79 y.o.   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   Other: he has never taken insulin.   Interval history: he was rx'ed prednisone last month, but he did not take.  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   4/18: thyroidectomy:  pT3b, pN0 6/18: RAI, on synthroid, 122 mCi, stimulated by thyrogen.  6/18: post-therapy scan pos at thyroid bed only.  10/18: TG is undetectable (ab pos).  4/19: TG is undetectable (ab pos, but lower titer).   5/20: TG= 0.3 (ab neg).   He does not notice any neck nodule.   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   Past Medical History:  Diagnosis Date  . Abnormality of gait   . Acute sinusitis, unspecified   . Allergy   . 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 Aortic valve disorders   . Arthritis    Osteoarthritis-bilateral knees, lower back issues occasionaly related to knee issues  . Benign paroxysmal positional vertigo    not in a long time  . Cataract    resolved  . CHF (congestive heart failure) ([place])   . Coronary atherosclerosis of native coronary artery   . Diabetes mellitus without complication ([place])    Diet control only.  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 Diverticulosis of colon (without mention of hemorrhage)   . Dysfunction of eustachian tube    Hard of hearing"bilateral hearing aids"  . Enlarged prostate   . GERD (gastroesophageal reflux disease)   . Headache(784.0)   . History of kidney stones    past hx. 15 yrs ago x1  . Hypertrophy of prostate without  urinary obstruction and other lower urinary tract symptoms (LUTS)   . Lipoprotein deficiencies   . Other and unspecified hyperlipidemia   . Other primary cardiomyopathies   . Pain in joint, lower leg   . Personal history of colonic polyps   . Personal history of other diseases of digestive system   . Pes anserinus tendinitis or bursitis    left shoulder remains an issue  . Sinoatrial node dysfunction (HCC)    [name] follows  . 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 Thoracic or lumbosacral neuritis or radiculitis, unspecified   . Unspecified disorder of skin and subcutaneous tissue   . Unspecified essential hypertension   . Unspecified vitamin D deficiency     Past Surgical History:  Procedure Laterality Date  . BASAL CELL CARCINOMA EXCISION  11/2016   Dr. Nevada Crane  . CATARACT EXTRACTION, BILATERAL    . COLONOSCOPY W/ POLYPECTOMY    . DOBUTAMINE STRESS ECHO  1/07   Lateral hypokinesis but no ischemia  . ESOPHAGOGASTRODUODENOSCOPY  11/07   Schatzki's ring, non bleeding erosive gastropathy [place]  . KNEE SURGERY Left 1947  . LYMPH NODE DISSECTION N/A 08/05/2016   Procedure: LIMITED LYMPH NODE DISSECTION;  Surgeon: Armandina Gemma, MD;  Location: [facility];  Service: General;  Laterality: N/A;  . Pulmonary functioning tests  1. 2003  2. 2005   1. Diminished lung capacity  2. Stable  . THYROIDECTOMY  N/A 08/05/2016   Procedure: TOTAL THYROIDECTOMY;  Surgeon: Armandina Gemma, MD;  Location: [facility];  Service: General;  Laterality: N/A;  . TONSILLECTOMY    . TOTAL KNEE ARTHROPLASTY Left 03/18/2016   Procedure: LEFT TOTAL KNEE ARTHROPLASTY;  Surgeon: Paralee Cancel, MD;  Location: [facility];  Service: Orthopedics;  Laterality: Left;    Social History   Socioeconomic History  . Marital status: Married    Spouse name: Not on file  . Number of children: 1  . Years of education: Not on file  . Highest education level: Not on file  Occupational History  . 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 Smoking status: Former Smoker    Quit date: 07/03/1980    Years since quitting: 39.0  . Smokeless tobacco: Never Used  Substance and Sexual Activity  . Alcohol use: Yes    Alcohol/week: 0.0 standard drinks    Comment: occassionally  . Drug use: No  . Sexual activity: Not on file  Other Topics Concern  . Not on file  Social History Narrative  . Not on file   Social Determinants of Health   Financial Resource Strain: Low Risk   . Difficulty of Paying Living Expenses: Not hard at all  Food Insecurity: No Food Insecurity  . Worried About Charity fundraiser in the Last Year: Never true  . Ran Out of Food in the Last Year: Never true  Transportation Needs: No Transportation Needs  . Lack of Transportation (Medical): No  . Lack of Transportation (Non-Medical): No  Physical Activity: Inactive  . Days of Exercise per Week: 0 days  . Minutes of Exercise per Session: 0 min  Stress: Stress Concern Present  . Feeling of Stress : To some extent  Social Connections:   . Frequency of Communication with Friends and Family:   . Frequency of Social Gatherings with Friends and Family:   . Attends Religious Services:   . Active Member of Clubs or Organizations:   . Attends Archivist Meetings:   [name] Marital Status:   Intimate Partner Violence: Not At Risk  . Fear of Current or Ex-Partner: No  . Emotionally Abused: No  . Physically Abused: No  . Sexually Abused: No    Current Outpatient Medications on File Prior to Visit  Medication Sig Dispense Refill  . aspirin EC 81 MG tablet Take 81 mg by mouth daily.    . Blood Glucose Monitoring Suppl (ONE TOUCH ULTRA 2) w/Device KIT Use to check blood sugar daily. 1 each 0  . cetirizine (ZYRTEC) 10 MG tablet Take 10 mg by mouth daily.    . Cholecalciferol (D3 ADULT PO) Take by mouth.    . finasteride (PROSCAR) 5 MG tablet Take 1 tablet (5 mg total) by mouth daily. 90 tablet 1  .  furosemide (LASIX) 40 MG tablet Take 1 tablet (40 mg total) by mouth daily. 90 tablet 2  . glucose blood (ONE TOUCH ULTRA TEST) test strip Use to check blood sugar two times a day. Dx: E11.9 (Patient taking differently: Use to check blood sugar every other day) 200 each 3  . Melatonin 5 MG TABS Take 5 mg by mouth at bedtime.     [name] omeprazole (PRILOSEC) 40 MG capsule TAKE 1 CAPSULE DAILY 90 capsule 3  . ONETOUCH DELICA LANCETS 72Z MISC Use to check blood sugar two times a day. Dx: E11.9 200 each 3  . PRESCRIPTION MEDICATION every 4 (  four) months. *Antibiotic Injection in Eye*    . ramipril (ALTACE) 2.5 MG capsule Take 1 capsule (2.5 mg total) by mouth daily. 10 capsule 0  . rosuvastatin (CRESTOR) 10 MG tablet TAKE 1 TABLET DAILY 90 tablet 2  . terazosin (HYTRIN) 10 MG capsule TAKE 1 CAPSULE DAILY 90 capsule 3   No current facility-administered medications on file prior to visit.    Allergies  Allergen Reactions  . Codeine Nausea Only  . Sulfonamide Derivatives Hives    Family History  Problem Relation Age of Onset  . Heart failure Mother   . Coronary artery disease Mother   . Stroke Mother   . Hypothyroidism Mother   . Cancer Father        LUNG  . Cancer Sister        BREAST  . Heart failure Maternal Grandmother   . Hypothyroidism Daughter   . Colon cancer Neg Hx     BP 100/60   Pulse 64   Ht 5' 9.75" (1.772 m)   Wt 207 lb (93.9 kg)   SpO2 94%   BMI 29.91 kg/m     Review of Systems Denies neck pain    Objective:   Physical Exam VITAL SIGNS:  See vs page GENERAL: no distress Neck: a healed scar is present.  I do not appreciate a nodule in the thyroid or elsewhere in the neck.   Lab Results  Component Value Date   CREATININE 0.95 02/16/2019   BUN 19 02/16/2019   NA 139 02/16/2019   K 4.2 02/16/2019   CL 104 02/16/2019   CO2 27 02/16/2019  TG=0.2 Lab Results  Component Value Date   TSH 1.88 06/29/2019   Lab Results  Component Value Date   HGBA1C 6.4  06/29/2019       Assessment & Plan:  Hypothyroidism: well-replaced PTC: stable DM: stable off rx  Patient Instructions  Blood tests are requested for you today.  We'll let you know about the results.  If the blood sugar is high, i'll prescribe a pill called "metformin."   Please come back for a follow-up appointment in 6 months.

## 2019-06-30 LAB — THYROGLOBULIN LEVEL: Thyroglobulin: 0.2 ng/mL — ABNORMAL LOW

## 2019-06-30 LAB — THYROGLOBULIN ANTIBODY: Thyroglobulin Ab: <1 IU/mL (ref <=1)

## 2019-06-30 MED ORDER — LEVOTHYROXINE SODIUM 150 MCG PO TABS: 150.0000 ug | ORAL_TABLET | Freq: Every day | ORAL | 3 refills | Status: DC

## 2019-07-06 ENCOUNTER — Other Ambulatory Visit: Payer: Self-pay | Admitting: Cardiovascular Disease

## 2019-07-19 NOTE — Progress Notes (Signed)
i  Chief Complaint  Patient presents with  . Follow-up    CAD   History of Present Illness: 79 yo male with a past medical history significant for diastolic and systolic CHF, hypertension, nonobstructive coronary artery disease, non-ischemic cardiomyopathy, benign prostatic hypertrophy and hyperlipidemia who is here today for cardiac follow up. Prior to 2005, he was known to have LVEF around 35%. He had an episode of chest pain in 2005 leading to a cardiac cath May 2005 which showed diffuse 15% irregularities in the LAD, diffuse 20% irregularities in the ramus intermedius branch, diffuse 30% irregularities in the nondominant circumflex, and a 50% stenosis in the proximal RCA with irregularities in the midportion of the vessel. The ascending aorta was mildly dilated with no evidence of dissection during that catheterization. Ejection fraction was noted to be 65%. An echocardiogram during that admission in May 2005 in [place] showed an ejection fraction of 55-60% with the ascending aorta at the sinotubular junction measuring 4.1 cm. LVEF has been noted to be around 45% on studies in 2010, 2011. Echo September 2013 suggested LVEF was around 30%. Cardiac MRI December 2014 showed mild enlargement of the aortic root (4.2 cm) and and EF of 44%. Last stress test 9/13 without ischemia. He was seen by telemedicine visit April 2020 and was doing well.   He is here today for follow up. He had chest pain and dyspnea one month ago while moving heavy objects. The pain lasted for 1 minute. No recurrence. He has been busy since then. The patient denies  palpitations, lower extremity edema, orthopnea, PND, dizziness, near syncope or syncope.    Primary Care Physician: Jinny Sanders, MD  Past Medical History:  Diagnosis Date  . Abnormality of gait   . Acute sinusitis, unspecified   . Allergy   . 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 Aortic valve disorders   . Arthritis    Osteoarthritis-bilateral knees, lower back issues occasionaly related to knee issues  . Benign paroxysmal positional vertigo    not in a long time  . Cataract    resolved  . CHF (congestive heart failure) ([place])   . Coronary atherosclerosis of native coronary artery   . Diabetes mellitus without complication ([place])    Diet control only.  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 Diverticulosis of colon (without mention of hemorrhage)   . Dysfunction of eustachian tube    Hard of hearing"bilateral hearing aids"  . Enlarged prostate   . GERD (gastroesophageal reflux disease)   . Headache(784.0)   . History of kidney stones    past hx. 15 yrs ago x1  . Hypertrophy of prostate without urinary obstruction and other lower urinary tract symptoms (LUTS)   . Lipoprotein deficiencies   . Other and unspecified hyperlipidemia   . Other primary cardiomyopathies   . Pain in joint, lower leg   . Personal history of colonic polyps   . Personal history of other diseases of digestive system   . Pes anserinus tendinitis or bursitis    left shoulder remains an issue  . Sinoatrial node dysfunction (HCC)    [name] follows  . 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 Thoracic or lumbosacral neuritis or radiculitis, unspecified   . Unspecified disorder of skin and subcutaneous tissue   . Unspecified essential hypertension   . Unspecified vitamin D deficiency     Past Surgical History:  Procedure Laterality Date  .  BASAL CELL CARCINOMA EXCISION  11/2016   Dr. Nevada Crane  . CATARACT EXTRACTION, BILATERAL    . COLONOSCOPY W/ POLYPECTOMY    . DOBUTAMINE STRESS ECHO  1/07   Lateral hypokinesis but no ischemia  . ESOPHAGOGASTRODUODENOSCOPY  11/07   Schatzki's ring, non bleeding erosive gastropathy [place]  . KNEE SURGERY Left 1947  . LYMPH NODE DISSECTION N/A 08/05/2016   Procedure: LIMITED LYMPH NODE DISSECTION;  Surgeon: Armandina Gemma, MD;  Location: [facility];   Service: General;  Laterality: N/A;  . Pulmonary functioning tests  1. 2003  2. 2005   1. Diminished lung capacity  2. Stable  . THYROIDECTOMY N/A 08/05/2016   Procedure: TOTAL THYROIDECTOMY;  Surgeon: Armandina Gemma, MD;  Location: [facility];  Service: General;  Laterality: N/A;  . TONSILLECTOMY    . TOTAL KNEE ARTHROPLASTY Left 03/18/2016   Procedure: LEFT TOTAL KNEE ARTHROPLASTY;  Surgeon: Paralee Cancel, MD;  Location: [facility];  Service: Orthopedics;  Laterality: Left;    Current Outpatient Medications  Medication Sig Dispense Refill  . aspirin EC 81 MG tablet Take 81 mg by mouth daily.    . Blood Glucose Monitoring Suppl (ONE TOUCH ULTRA 2) w/Device KIT Use to check blood sugar daily. 1 each 0  . cetirizine (ZYRTEC) 10 MG tablet Take 10 mg by mouth daily.    . Cholecalciferol (D3 ADULT PO) Take by mouth.    . finasteride (PROSCAR) 5 MG tablet Take 1 tablet (5 mg total) by mouth daily. 90 tablet 1  . furosemide (LASIX) 40 MG tablet Take 1 tablet (40 mg total) by mouth daily. 90 tablet 3  . glucose blood (ONE TOUCH ULTRA TEST) test strip Use to check blood sugar two times a day. Dx: E11.9 (Patient taking differently: Use to check blood sugar every other day) 200 each 3  . levothyroxine (SYNTHROID) 150 MCG tablet Take 1 tablet (150 mcg total) by mouth daily before breakfast. 90 tablet 3  . Melatonin 5 MG TABS Take 5 mg by mouth at bedtime.     [name] omeprazole (PRILOSEC) 40 MG capsule TAKE 1 CAPSULE DAILY 90 capsule 3  . ONETOUCH DELICA LANCETS 16S MISC Use to check blood sugar two times a day. Dx: E11.9 200 each 3  . PRESCRIPTION MEDICATION every 4 (four) months. *Antibiotic Injection in Eye*    . ramipril (ALTACE) 2.5 MG capsule Take 1 capsule (2.5 mg total) by mouth daily. 90 capsule 3  . rosuvastatin (CRESTOR) 10 MG tablet Take 1 tablet (10 mg total) by mouth daily. 90 tablet 3  . terazosin (HYTRIN) 10 MG capsule TAKE 1 CAPSULE DAILY 90 capsule 3   No current facility-administered medications  for this visit.    Allergies  Allergen Reactions  . Codeine Nausea Only  . Sulfonamide Derivatives Hives    Social History   Socioeconomic History  . Marital status: Married    Spouse name: Not on file  . Number of children: 1  . Years of education: Not on file  . Highest education level: Not on file  Occupational History  . 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 Smoking status: Former Smoker    Quit date: 07/03/1980    Years since quitting: 39.0  . Smokeless tobacco: Never Used  Substance and Sexual Activity  . Alcohol use: Yes    Alcohol/week: 0.0 standard drinks    Comment: occassionally  . Drug use: No  . Sexual activity: Not on file  Other Topics Concern  . Not on file  Social History Narrative  . Not on file   Social Determinants of Health   Financial Resource Strain: Low Risk   . Difficulty of Paying Living Expenses: Not hard at all  Food Insecurity: No Food Insecurity  . Worried About Charity fundraiser in the Last Year: Never true  . Ran Out of Food in the Last Year: Never true  Transportation Needs: No Transportation Needs  . Lack of Transportation (Medical): No  . Lack of Transportation (Non-Medical): No  Physical Activity: Inactive  . Days of Exercise per Week: 0 days  . Minutes of Exercise per Session: 0 min  Stress: Stress Concern Present  . Feeling of Stress : To some extent  Social Connections:   . Frequency of Communication with Friends and Family:   . Frequency of Social Gatherings with Friends and Family:   . Attends Religious Services:   . Active Member of Clubs or Organizations:   . Attends Archivist Meetings:   [name] Marital Status:   Intimate Partner Violence: Not At Risk  . Fear of Current or Ex-Partner: No  . Emotionally Abused: No  . Physically Abused: No  . Sexually Abused: No    Family History  Problem Relation Age of Onset  . Heart failure Mother   . Coronary artery disease Mother    . Stroke Mother   . Hypothyroidism Mother   . Cancer Father        LUNG  . Cancer Sister        BREAST  . Heart failure Maternal Grandmother   . Hypothyroidism Daughter   . Colon cancer Neg Hx     Review of Systems:  As stated in the HPI and otherwise negative.   BP 100/70   Pulse (!) 57   Ht 5' 9.75" (1.772 m)   Wt 207 lb 12.8 oz (94.3 kg)   SpO2 98%   BMI 30.03 kg/m   Physical Examination:  General: Well developed, well nourished, NAD  HEENT: OP clear, mucus membranes moist  SKIN: warm, dry. No rashes. Neuro: No focal deficits  Musculoskeletal: Muscle strength 5/5 all ext  Psychiatric: Mood and affect normal  Neck: No JVD, no carotid bruits, no thyromegaly, no lymphadenopathy.  Lungs:Clear bilaterally, no wheezes, rhonci, crackles Cardiovascular: Regular rate and rhythm. No murmurs, gallops or rubs. Abdomen:Soft. Bowel sounds present. Non-tender.  Extremities: No lower extremity edema. Pulses are 2 + in the bilateral DP/PT.  Echo 12/10/11: Left ventricle: The cavity size was normal. Wall thickness was increased in a pattern of mild LVH. The estimated ejection fraction was 30%. Diffuse hypokinesis. Doppler parameters are consistent with abnormal left ventricular relaxation (grade 1 diastolic dysfunction). - Aortic valve: There was no stenosis. Trivial regurgitation. - Aorta: Ascending aorta dimension: 43 mm. Mildly dilated aortic root and ascending aorta. Aortic root dimension:12m (ED). - Mitral valve: Trivial regurgitation. - Left atrium: The atrium was mildly dilated. - Right ventricle: The cavity size was normal. Systolic function was mildly to moderately reduced. - Tricuspid valve: Peak RV-RA gradient: 212mHg (S). - Pulmonary arteries: PA peak pressure: 3298mg (S). - Systemic veins: IVC measured 1.9 cm with some respirophasic variation, suggesting RA pressure 10 mmHg. Impressions:  - Normal LV size with mild LV hypertrophy. EF 30% with diffuse  hypokinesis. Normal RV size with mild to moderate hypokinesis. No significant valvular abnormalities. Dilation of the aortic root and ascending aorta. Would consider MRA of the chest  to further delineate the thoracic aorta.  Cardiac MRI 02/22/13: On limited views of the lung fields, there were no gross abnormalities. Normal left ventricular size with normal wall thickness. There was global hypokinesis with EF calculated to be 44%. The right ventricle was normal in size with mild systolic dysfunction. The left atrium was mildly dilated. The right atrium was normal in size. The aortic valve was trileaflet. It opened well, I do not think that there was significant aortic stenosis. There was trivial aortic insufficiency. Significant mitral regurgitation was not noted. On delayed enhancement imaging, there was no myocardial delayed enhancement. On MRA of the chest, the aortic root and ascending aorta were mildly dilated. The arch and descending aorta were normal in caliber. The arch vessels originated normally. The pulmonary veins drained normally to the left atrium. Measurements: LV EDV 146 mL LV SV 64 mL LV EF 44% Aortic root 4.2 cm Ascending aorta 4.1 cm (maximal diameter) Arch 3.3 cm Descending thoracic aorta 3.0 cm IMPRESSION: 1. Normal LV size with mild global hypokinesis, EF 44%. There was no myocardial delayed enhancement so no definitive evidence for prior myocardial infarction, infiltrative disease, or myocarditis. 2. Normal RV size with mildly decreased systolic function. 3. Mild aortic root (4.2 cm) and ascending thoracic aorta (4.1 cm) Dilation.  EKG:  EKG is not  ordered today. The ekg ordered today demonstrates   Recent Labs: 02/16/2019: ALT 14; BUN 19; Creatinine, Ser 0.95; Potassium 4.2; Sodium 139 06/29/2019: TSH 1.88   Lipid Panel    Component Value Date/Time   CHOL 126 02/16/2019 0856   TRIG 55.0 02/16/2019 0856   HDL 41.10 02/16/2019 0856   CHOLHDL 3  02/16/2019 0856   VLDL 11.0 02/16/2019 0856   LDLCALC 73 02/16/2019 0856     Wt Readings from Last 3 Encounters:  07/20/19 207 lb 12.8 oz (94.3 kg)  06/29/19 207 lb (93.9 kg)  06/03/19 209 lb 4 oz (94.9 kg)     Other studies Reviewed: Additional studies/ records that were reviewed today include: . Review of the above records demonstrates:    Assessment and Plan:   1. CAD with angina: recent exertional chest pain. He is known to have non-obstructive disease by cath in 2005. Will arrange an exercise nuclearstress test. Will arrange an echo to assess LVEF. Continue ASA and statin.   2. Non-ischemic Cardiomyopathy: LVEF 44% by cardiac MRI December 2014. He is not on a beta blocker due to bradycardia. Continue Ace-inh.   3. Thoracic aortic aneurysm:  Stable at 4.2 cm by CTA in March 2018. Will repeat chest CTA now.   4. HLD: LDL is at goal.Continue statin  5. Chronic systolic CHF: Weight is stable. No volume overload on exam. Continue Lasix  6. Carotid bruit, right: Carotid dopplers January 2016 with no evidence of carotid artery disease.   7. Diabetes, type 2: diet controlled. Followed in primary care.   Current medicines are reviewed at length with the patient today.  The patient does not have concerns regarding medicines.  The following changes have been made:  no change  Labs/ tests ordered today include:   Orders Placed This Encounter  Procedures  . CT ANGIO CHEST AORTA W/CM & OR WO/CM  . Basic metabolic panel  . MYOCARDIAL PERFUSION IMAGING  . ECHOCARDIOGRAM COMPLETE   Disposition:   FU with me in 6 months  Signed, Lauree Chandler, MD 07/20/2019 9:30 AM    [address], [place], [place]  [address]  Phone: [phone]; Fax: [phone]

## 2019-07-20 ENCOUNTER — Ambulatory Visit (INDEPENDENT_AMBULATORY_CARE_PROVIDER_SITE_OTHER): Payer: Medicare Other | Admitting: Cardiovascular Disease

## 2019-07-20 ENCOUNTER — Other Ambulatory Visit: Payer: Self-pay

## 2019-07-20 ENCOUNTER — Encounter: Payer: Self-pay | Admitting: *Deleted

## 2019-07-20 ENCOUNTER — Encounter: Payer: Self-pay | Admitting: Cardiovascular Disease

## 2019-07-20 VITALS — BP 100/70 | HR 57 | Ht 69.75 in | Wt 207.8 lb

## 2019-07-20 DIAGNOSIS — I2510 Atherosclerotic heart disease of native coronary artery without angina pectoris: Secondary | ICD-10-CM

## 2019-07-20 DIAGNOSIS — I712 Thoracic aortic aneurysm, without rupture, unspecified: Secondary | ICD-10-CM

## 2019-07-20 DIAGNOSIS — I5022 Chronic systolic (congestive) heart failure: Secondary | ICD-10-CM

## 2019-07-20 DIAGNOSIS — E7800 Pure hypercholesterolemia, unspecified: Secondary | ICD-10-CM | POA: Diagnosis not present

## 2019-07-20 DIAGNOSIS — I428 Other cardiomyopathies: Secondary | ICD-10-CM

## 2019-07-20 LAB — BASIC METABOLIC PANEL
BUN/Creatinine Ratio: 18 (ref 10–24)
BUN: 18 mg/dL (ref 8–27)
CO2: 22 mmol/L (ref 20–29)
Calcium: 8.9 mg/dL (ref 8.6–10.2)
Chloride: 103 mmol/L (ref 96–106)
Creatinine, Ser: 1.02 mg/dL (ref 0.76–1.27)
GFR calc Af Amer: 81 mL/min/{1.73_m2} (ref >59)
GFR calc non Af Amer: 70 mL/min/{1.73_m2} (ref >59)
Glucose: 114 mg/dL — ABNORMAL HIGH (ref 65–99)
Potassium: 3.9 mmol/L (ref 3.5–5.2)
Sodium: 140 mmol/L (ref 134–144)

## 2019-07-20 MED ORDER — FUROSEMIDE 40 MG PO TABS: 40.0000 mg | ORAL_TABLET | Freq: Every day | ORAL | 3 refills | Status: DC

## 2019-07-20 MED ORDER — ROSUVASTATIN CALCIUM 10 MG PO TABS: 10.0000 mg | ORAL_TABLET | Freq: Every day | ORAL | 3 refills | Status: DC

## 2019-07-20 MED ORDER — RAMIPRIL 2.5 MG PO CAPS: 2.5000 mg | ORAL_CAPSULE | Freq: Every day | ORAL | 3 refills | Status: DC

## 2019-07-20 NOTE — Patient Instructions (Addendum)
Medication Instructions:  No changes *If you need a refill on your cardiac medications before your next appointment, please call your pharmacy*   Lab Work: Today:  BMET  Testing/Procedures: Your physician has requested that you have an echocardiogram. Echocardiography is a painless test that uses sound waves to create images of your heart. It provides your doctor with information about the size and shape of your heart and how well your heart's chambers and valves are working. This procedure takes approximately one hour. There are no restrictions for this procedure.  Your physician has requested that you have en exercise stress myoview. For further information please visit [email or web address]. Please follow instruction sheet, as given.  Non-Cardiac CT Angiography (CTA), is a special type of CT scan that uses a computer to produce multi-dimensional views of major blood vessels throughout the body. In CT angiography, a contrast material is injected through an IV to help visualize the blood vessels    Follow-Up: At [facility], you and your health needs are our priority.  As part of our continuing mission to provide you with exceptional heart care, we have created designated Provider Care Teams.  These Care Teams include your primary Cardiologist (physician) and Advanced Practice Providers (APPs -  Physician Assistants and Nurse Practitioners) who all work together to provide you with the care you need, when you need it.  Your next appointment:   6 month(s)  The format for your next appointment:   Either In Person or Virtual  Provider:   You may see Lauree Chandler, MD or one of the following Advanced Practice Providers on your designated Care Team:    Melina Copa, PA-C  Ermalinda Barrios, PA-C    Other Instructions

## 2019-08-04 ENCOUNTER — Telehealth (HOSPITAL_COMMUNITY): Payer: Self-pay | Admitting: *Deleted

## 2019-08-04 NOTE — Telephone Encounter (Signed)
Patient given detailed instructions per Myocardial Perfusion Study Information Sheet for the test on 08/10/19. Patient notified to arrive 15 minutes early and that it is imperative to arrive on time for appointment to keep from having the test rescheduled.  If you need to cancel or reschedule your appointment, please call the office within 24 hours of your appointment. . Patient verbalized understanding. Kierstan Auer Jacqueline    

## 2019-08-06 ENCOUNTER — Other Ambulatory Visit (HOSPITAL_COMMUNITY)
Admission: RE | Admit: 2019-08-06 | Discharge: 2019-08-06 | Disposition: A | Discharge status: Home / Self Care | Payer: Medicare Other | Source: Ambulatory Visit | Attending: Cardiovascular Disease | Admitting: Cardiovascular Disease

## 2019-08-06 DIAGNOSIS — Z01812 Encounter for preprocedural laboratory examination: Secondary | ICD-10-CM | POA: Diagnosis not present

## 2019-08-06 DIAGNOSIS — Z20822 Contact with and (suspected) exposure to covid-19: Secondary | ICD-10-CM | POA: Insufficient documentation

## 2019-08-06 LAB — SARS CORONAVIRUS 2 (TAT 6-24 HRS): SARS Coronavirus 2: NEGATIVE

## 2019-08-10 ENCOUNTER — Ambulatory Visit (HOSPITAL_COMMUNITY): Payer: Medicare Other | Attending: Cardiovascular Disease

## 2019-08-10 ENCOUNTER — Ambulatory Visit (HOSPITAL_BASED_OUTPATIENT_CLINIC_OR_DEPARTMENT_OTHER): Payer: Medicare Other

## 2019-08-10 ENCOUNTER — Other Ambulatory Visit: Payer: Self-pay

## 2019-08-10 DIAGNOSIS — I712 Thoracic aortic aneurysm, without rupture, unspecified: Secondary | ICD-10-CM

## 2019-08-10 DIAGNOSIS — I2510 Atherosclerotic heart disease of native coronary artery without angina pectoris: Secondary | ICD-10-CM

## 2019-08-10 DIAGNOSIS — I5022 Chronic systolic (congestive) heart failure: Secondary | ICD-10-CM

## 2019-08-10 DIAGNOSIS — E7800 Pure hypercholesterolemia, unspecified: Secondary | ICD-10-CM | POA: Diagnosis not present

## 2019-08-10 DIAGNOSIS — I428 Other cardiomyopathies: Secondary | ICD-10-CM

## 2019-08-10 LAB — MYOCARDIAL PERFUSION IMAGING
Estimated workload: 7 METS
Exercise duration (min): 5 min
Exercise duration (sec): 0 s
LV dias vol: 163 mL (ref 62–150)
LV sys vol: 86 mL
MPHR: 142 {beats}/min
Peak HR: 133 {beats}/min
Percent HR: 93 %
Rest HR: 58 {beats}/min
SDS: 2
SRS: 4
SSS: 7
TID: 0.86

## 2019-08-10 LAB — ECHOCARDIOGRAM COMPLETE
Height: 70 in
Weight: 3312 oz

## 2019-08-10 MED ORDER — TECHNETIUM TC 99M TETROFOSMIN IV KIT
32.8000 | PACK | Freq: Once | INTRAVENOUS | Status: AC | PRN
  Administered 2019-08-10: 32.8 via INTRAVENOUS
  Filled 2019-08-10: qty 33

## 2019-08-10 MED ORDER — TECHNETIUM TC 99M TETROFOSMIN IV KIT
10.2000 | PACK | Freq: Once | INTRAVENOUS | Status: AC | PRN
  Administered 2019-08-10: 10.2 via INTRAVENOUS
  Filled 2019-08-10: qty 11

## 2019-08-15 ENCOUNTER — Telehealth: Payer: Self-pay | Admitting: Family Medicine

## 2019-08-15 ENCOUNTER — Other Ambulatory Visit (INDEPENDENT_AMBULATORY_CARE_PROVIDER_SITE_OTHER): Payer: Medicare Other

## 2019-08-15 DIAGNOSIS — E559 Vitamin D deficiency, unspecified: Secondary | ICD-10-CM

## 2019-08-15 DIAGNOSIS — E11319 Type 2 diabetes mellitus with unspecified diabetic retinopathy without macular edema: Secondary | ICD-10-CM | POA: Diagnosis not present

## 2019-08-15 DIAGNOSIS — E890 Postprocedural hypothyroidism: Secondary | ICD-10-CM

## 2019-08-15 LAB — LIPID PANEL
Cholesterol: 122 mg/dL (ref 0–200)
HDL: 36.7 mg/dL — ABNORMAL LOW (ref >39.00)
LDL Cholesterol: 67 mg/dL (ref 0–99)
NonHDL: 85.04
Total CHOL/HDL Ratio: 3
Triglycerides: 91 mg/dL (ref 0.0–149.0)
VLDL: 18.2 mg/dL (ref 0.0–40.0)

## 2019-08-15 LAB — VITAMIN D 25 HYDROXY (VIT D DEFICIENCY, FRACTURES): VITD: 45.32 ng/mL (ref 30.00–100.00)

## 2019-08-15 NOTE — Telephone Encounter (Signed)
-----   Message from Cloyd Stagers, RT sent at 08/02/2019  2:28 PM EDT ----- Regarding: Lab Orders for Monday 5.24.2021 Please place lab orders for Monday 5.24.2021, office visit for 6 month f/u on Tuesday 6.1.2021 Thank you, Dyke Maes RT(R)

## 2019-08-16 NOTE — Progress Notes (Signed)
No critical labs need to be addressed urgently. We will discuss labs in detail at upcoming office visit.   

## 2019-08-17 ENCOUNTER — Other Ambulatory Visit: Payer: Self-pay

## 2019-08-17 ENCOUNTER — Ambulatory Visit
Admission: RE | Admit: 2019-08-17 | Discharge: 2019-08-17 | Disposition: A | Discharge status: Home / Self Care | Payer: Medicare Other | Source: Ambulatory Visit | Attending: Cardiovascular Disease | Admitting: Cardiovascular Disease

## 2019-08-17 DIAGNOSIS — I712 Thoracic aortic aneurysm, without rupture, unspecified: Secondary | ICD-10-CM

## 2019-08-17 MED ORDER — IOPAMIDOL (ISOVUE-370) INJECTION 76%
75.0000 mL | Freq: Once | INTRAVENOUS | Status: AC | PRN
  Administered 2019-08-17: 75 mL via INTRAVENOUS

## 2019-08-23 ENCOUNTER — Ambulatory Visit (INDEPENDENT_AMBULATORY_CARE_PROVIDER_SITE_OTHER): Payer: Medicare Other | Admitting: Family Medicine

## 2019-08-23 ENCOUNTER — Encounter: Payer: Self-pay | Admitting: Family Medicine

## 2019-08-23 ENCOUNTER — Other Ambulatory Visit: Payer: Self-pay

## 2019-08-23 DIAGNOSIS — E785 Hyperlipidemia, unspecified: Secondary | ICD-10-CM | POA: Diagnosis not present

## 2019-08-23 DIAGNOSIS — I10 Essential (primary) hypertension: Secondary | ICD-10-CM

## 2019-08-23 DIAGNOSIS — E1159 Type 2 diabetes mellitus with other circulatory complications: Secondary | ICD-10-CM

## 2019-08-23 DIAGNOSIS — M5416 Radiculopathy, lumbar region: Secondary | ICD-10-CM

## 2019-08-23 DIAGNOSIS — E1169 Type 2 diabetes mellitus with other specified complication: Secondary | ICD-10-CM | POA: Diagnosis not present

## 2019-08-23 DIAGNOSIS — I2510 Atherosclerotic heart disease of native coronary artery without angina pectoris: Secondary | ICD-10-CM

## 2019-08-23 DIAGNOSIS — I152 Hypertension secondary to endocrine disorders: Secondary | ICD-10-CM

## 2019-08-23 DIAGNOSIS — M7072 Other bursitis of hip, left hip: Secondary | ICD-10-CM | POA: Insufficient documentation

## 2019-08-23 DIAGNOSIS — E11319 Type 2 diabetes mellitus with unspecified diabetic retinopathy without macular edema: Secondary | ICD-10-CM

## 2019-08-23 LAB — HM DIABETES FOOT EXAM

## 2019-08-23 MED ORDER — ONETOUCH DELICA LANCETS 33G MISC: 3 refills | Status: DC

## 2019-08-23 MED ORDER — GLUCOSE BLOOD VI STRP: ORAL_STRIP | 3 refills | Status: DC

## 2019-08-23 NOTE — Assessment & Plan Note (Signed)
Well controlled. Continue current medication.  

## 2019-08-23 NOTE — Assessment & Plan Note (Signed)
Treat with tylenol, homePT, heat.  If not improving consider prednisone taper.

## 2019-08-23 NOTE — Patient Instructions (Addendum)
Work on regular exercise, low carb and low fat heart healthy diet.  Start with low back and hip stretching, home PT.  Tylenol for pain.  Heat on low back.  If not improving in 3-4 weeks call for consideration of referral to rotho or prednisone taper.   

## 2019-08-23 NOTE — Assessment & Plan Note (Signed)
Diet controlled.  

## 2019-08-23 NOTE — Assessment & Plan Note (Signed)
At goal on crestor. 

## 2019-08-23 NOTE — Assessment & Plan Note (Signed)
Last eye exam 04/2019

## 2019-08-23 NOTE — Progress Notes (Signed)
Chief Complaint  Patient presents with  . Diabetes    History of Present Illness: HPI   79 year old male presents for follow up DM, Chol, HTN.  He is having issues with back pain, low back left lower back.  Occ radiates down to left toes.  no numbness  Also tender in left bursa.. off and on when lying on that side.  Both ongoing x 1-2 months  Hx of steroid injection in left bursa few years a go. No falls.  Diabetes:   Good control  With diet.  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 Lab Results  Component Value Date   HGBA1C 6.4 06/29/2019  Using medications without difficulties: Hypoglycemic episodes: Hyperglycemic episodes: Feet problems: no ulcer Blood Sugars averaging: eye exam within last year: yes 04/2019  Elevated Cholesterol:  At goal on crestor. Lab Results  Component Value Date   CHOL 122 08/15/2019   HDL 36.70 (L) 08/15/2019   LDLCALC 67 08/15/2019   TRIG 91.0 08/15/2019   CHOLHDL 3 08/15/2019  Using medications without problems: Muscle aches:  Diet compliance: low car b diet Exercise: working on feet 4 days a week Other complaints:  Hypertension:    At goal on ACEI, lasix BP Readings from Last 3 Encounters:  08/23/19 100/70  07/20/19 100/70  06/29/19 100/60  Using medication without problems or lightheadedness: occ Chest pain with exertion: none Edema:none Short of breath:none Average home BPs:100/70 Other issues: Follwed by Dr. Glennie Hawk   This visit occurred during the SARS-CoV-2 public health emergency.  Safety protocols were in place, including screening questions prior to the visit, additional usage of staff PPE, and extensive cleaning of exam room while observing appropriate contact time as indicated for disinfecting solutions.   COVID 19 screen:  No recent travel or known exposure to COVID19 The patient denies respiratory symptoms of COVID 19 at this time. The importance of social distancing was discussed today.     Review of Systems   Constitutional: Negative for chills and fever.  HENT: Negative for congestion and ear pain.   Eyes: Negative for pain and redness.  Respiratory: Negative for cough and shortness of breath.   Cardiovascular: Negative for chest pain, palpitations and leg swelling.  Gastrointestinal: Negative for abdominal pain, blood in stool, constipation, diarrhea, nausea and vomiting.  Genitourinary: Negative for dysuria.  Musculoskeletal: Negative for falls and myalgias.  Skin: Negative for rash.  Neurological: Negative for dizziness.  Psychiatric/Behavioral: Negative for depression. The patient is not nervous/anxious.       Past Medical History:  Diagnosis Date  . Abnormality of gait   . Acute sinusitis, unspecified   . Allergy   . 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 Aortic valve disorders   . Arthritis    Osteoarthritis-bilateral knees, lower back issues occasionaly related to knee issues  . Benign paroxysmal positional vertigo    not in a long time  . Cataract    resolved  . CHF (congestive heart failure) ([place])   . Coronary atherosclerosis of native coronary artery   . Diabetes mellitus without complication ([place])    Diet control only.  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 Diverticulosis of colon (without mention of hemorrhage)   . Dysfunction of eustachian tube    Hard of hearing"bilateral hearing aids"  . Enlarged prostate   . GERD (gastroesophageal reflux disease)   . Headache(784.0)   . History of kidney stones    past hx.  15 yrs ago x1  . Hypertrophy of prostate without urinary obstruction and other lower urinary tract symptoms (LUTS)   . Lipoprotein deficiencies   . Other and unspecified hyperlipidemia   . Other primary cardiomyopathies   . Pain in joint, lower leg   . Personal history of colonic polyps   . Personal history of other diseases of digestive system   . Pes anserinus tendinitis or bursitis    left shoulder  remains an issue  . Sinoatrial node dysfunction (HCC)    [name] follows  . 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 Thoracic or lumbosacral neuritis or radiculitis, unspecified   . Unspecified disorder of skin and subcutaneous tissue   . Unspecified essential hypertension   . Unspecified vitamin D deficiency     reports that he quit smoking about 39 years ago. He has never used smokeless tobacco. He reports current alcohol use. He reports that he does not use drugs.   Current Outpatient Medications:  .  aspirin EC 81 MG tablet, Take 81 mg by mouth daily., Disp: , Rfl:  .  Blood Glucose Monitoring Suppl (ONE TOUCH ULTRA 2) w/Device KIT, Use to check blood sugar daily., Disp: 1 each, Rfl: 0 .  cetirizine (ZYRTEC) 10 MG tablet, Take 10 mg by mouth daily., Disp: , Rfl:  .  Cholecalciferol (D3 ADULT PO), Take by mouth., Disp: , Rfl:  .  finasteride (PROSCAR) 5 MG tablet, Take 1 tablet (5 mg total) by mouth daily., Disp: 90 tablet, Rfl: 1 .  furosemide (LASIX) 40 MG tablet, Take 1 tablet (40 mg total) by mouth daily., Disp: 90 tablet, Rfl: 3 .  glucose blood (ONE TOUCH ULTRA TEST) test strip, Use to check blood sugar two times a day. Dx: E11.9 (Patient taking differently: Use to check blood sugar every other day), Disp: 200 each, Rfl: 3 .  levothyroxine (SYNTHROID) 150 MCG tablet, Take 1 tablet (150 mcg total) by mouth daily before breakfast., Disp: 90 tablet, Rfl: 3 .  Melatonin 5 MG TABS, Take 5 mg by mouth at bedtime. , Disp: , Rfl:  .  omeprazole (PRILOSEC) 40 MG capsule, TAKE 1 CAPSULE DAILY, Disp: 90 capsule, Rfl: 3 .  ONETOUCH DELICA LANCETS 67T MISC, Use to check blood sugar two times a day. Dx: E11.9, Disp: 200 each, Rfl: 3 .  PRESCRIPTION MEDICATION, every 4 (four) months. *Antibiotic Injection in Eye*, Disp: , Rfl:  .  ramipril (ALTACE) 2.5 MG capsule, Take 1 capsule (2.5 mg total) by mouth daily., Disp: 90 capsule, Rfl: 3 .  rosuvastatin (CRESTOR) 10 MG tablet, Take 1  tablet (10 mg total) by mouth daily., Disp: 90 tablet, Rfl: 3 .  terazosin (HYTRIN) 10 MG capsule, TAKE 1 CAPSULE DAILY, Disp: 90 capsule, Rfl: 3   Observations/Objective: Blood pressure 100/70, pulse (!) 59, height 5' 9.75" (1.772 m), weight 204 lb (92.5 kg), SpO2 96 %.  Physical Exam Constitutional:      Appearance: He is well-developed.  HENT:     Head: Normocephalic.     Right Ear: Hearing normal.     Left Ear: Hearing normal.     Nose: Nose normal.  Neck:     Thyroid: No thyroid mass or thyromegaly.     Vascular: No carotid bruit.     Trachea: Trachea normal.  Cardiovascular:     Rate and Rhythm: Normal rate and regular rhythm.     Pulses: Normal pulses.     Heart sounds: Heart sounds not distant.  No murmur heard.  No friction rub. No gallop.      Comments: No peripheral edema Pulmonary:     Effort: Pulmonary effort is normal. No respiratory distress.     Breath sounds: Normal breath sounds.  Musculoskeletal:     Lumbar back: Tenderness present. No deformity or bony tenderness. Decreased range of motion. Negative right straight leg raise test and negative left straight leg raise test.  Skin:    General: Skin is warm and dry.     Findings: No rash.  Psychiatric:        Speech: Speech normal.        Behavior: Behavior normal.        Thought Content: Thought content normal.      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  Essential hypertension, benign Well controlled. Continue current medication.   DM retinopathy ([place])  Last eye exam 04/2019  Pure hypercholesterolemia At goal on crestor.  Lumbar back pain with radiculopathy affecting left lower extremity Treat with tylenol, homePT, heat.  If not improving consider prednisone taper.  Hyperlipidemia  associated with type 2 diabetes mellitus ([place]) At goal on crestor.  BACK PAIN, LUMBAR, WITH RADICULOPATHY  Work on regular exercise, low carb and low fat heart healthy diet.  Start with low back and hip stretching, home PT.  Tylenol for pain.  Heat on low back.  If not improving in 3-4 weeks call for consideration of referral to rotho or prednisone taper.       Eliezer Lofts, MD

## 2019-10-03 ENCOUNTER — Other Ambulatory Visit: Payer: Self-pay

## 2019-10-03 ENCOUNTER — Encounter (INDEPENDENT_AMBULATORY_CARE_PROVIDER_SITE_OTHER): Payer: Medicare Other | Admitting: Ophthalmology

## 2019-10-03 DIAGNOSIS — H43813 Vitreous degeneration, bilateral: Secondary | ICD-10-CM

## 2019-10-03 DIAGNOSIS — I10 Essential (primary) hypertension: Secondary | ICD-10-CM

## 2019-10-03 DIAGNOSIS — H35371 Puckering of macula, right eye: Secondary | ICD-10-CM | POA: Diagnosis not present

## 2019-10-03 DIAGNOSIS — H34811 Central retinal vein occlusion, right eye, with macular edema: Secondary | ICD-10-CM

## 2019-10-03 DIAGNOSIS — H35033 Hypertensive retinopathy, bilateral: Secondary | ICD-10-CM

## 2019-10-03 DIAGNOSIS — H33301 Unspecified retinal break, right eye: Secondary | ICD-10-CM

## 2019-10-18 ENCOUNTER — Other Ambulatory Visit: Payer: Self-pay | Admitting: Family Medicine

## 2019-10-27 DIAGNOSIS — E1169 Type 2 diabetes mellitus with other specified complication: Secondary | ICD-10-CM | POA: Insufficient documentation

## 2019-10-27 DIAGNOSIS — E785 Hyperlipidemia, unspecified: Secondary | ICD-10-CM | POA: Insufficient documentation

## 2019-10-27 NOTE — Assessment & Plan Note (Signed)
Work on regular exercise, low carb and low fat heart healthy diet.  Start with low back and hip stretching, home PT.  Tylenol for pain.  Heat on low back.  If not improving in 3-4 weeks call for consideration of referral to rotho or prednisone taper.

## 2019-10-27 NOTE — Assessment & Plan Note (Signed)
At goal on crestor. 

## 2019-12-07 DIAGNOSIS — Z23 Encounter for immunization: Secondary | ICD-10-CM | POA: Diagnosis not present

## 2019-12-13 ENCOUNTER — Other Ambulatory Visit: Payer: Self-pay

## 2019-12-13 ENCOUNTER — Ambulatory Visit (INDEPENDENT_AMBULATORY_CARE_PROVIDER_SITE_OTHER): Payer: Medicare Other | Admitting: Podiatry

## 2019-12-13 DIAGNOSIS — G629 Polyneuropathy, unspecified: Secondary | ICD-10-CM | POA: Diagnosis not present

## 2019-12-13 DIAGNOSIS — E0843 Diabetes mellitus due to underlying condition with diabetic autonomic (poly)neuropathy: Secondary | ICD-10-CM | POA: Diagnosis not present

## 2019-12-13 NOTE — Progress Notes (Signed)
HPI: 79 y.o. male presenting today as an established patient PMHx diabetes mellitus type 2 for evaluation of loss of sensation to the bilateral feet.  He has a longstanding history of peripheral neuropathy and he states that it is affecting his balance.  He feels very unsteady during gait.  No severe pain however he does have a loss of sensation.  He presents for further treatment and evaluation  Past Medical History:  Diagnosis Date  . Abnormality of gait   . Acute sinusitis, unspecified   . Allergy   . 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 Aortic valve disorders   . Arthritis    Osteoarthritis-bilateral knees, lower back issues occasionaly related to knee issues  . Benign paroxysmal positional vertigo    not in a long time  . Cataract    resolved  . CHF (congestive heart failure) ([place])   . Coronary atherosclerosis of native coronary artery   . Diabetes mellitus without complication ([place])    Diet control only.  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 Diverticulosis of colon (without mention of hemorrhage)   . Dysfunction of eustachian tube    Hard of hearing"bilateral hearing aids"  . Enlarged prostate   . GERD (gastroesophageal reflux disease)   . Headache(784.0)   . History of kidney stones    past hx. 15 yrs ago x1  . Hypertrophy of prostate without urinary obstruction and other lower urinary tract symptoms (LUTS)   . Lipoprotein deficiencies   . Other and unspecified hyperlipidemia   . Other primary cardiomyopathies   . Pain in joint, lower leg   . Personal history of colonic polyps   . Personal history of other diseases of digestive system   . Pes anserinus tendinitis or bursitis    left shoulder remains an issue  . Sinoatrial node dysfunction (HCC)    [name] follows  . 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 Thoracic or lumbosacral neuritis or radiculitis, unspecified   . Unspecified disorder of skin and  subcutaneous tissue   . Unspecified essential hypertension   . Unspecified vitamin D deficiency      Physical Exam: General: The patient is alert and oriented x3 in no acute distress.  Dermatology: Skin is warm, dry and supple bilateral lower extremities. Negative for open lesions or macerations.  There is some hyperkeratotic preulcerative calluses noted to the bilateral feet  Vascular: Palpable pedal pulses bilaterally. No edema or erythema noted. Capillary refill within normal limits.  Neurological: Epicritic and protective threshold diminished bilaterally.   Musculoskeletal Exam: Range of motion within normal limits to all pedal and ankle joints bilateral. Muscle strength 5/5 in all groups bilateral.    Assessment: 1.  Diabetes mellitus type 2 2.  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  Loss of balance during gait/unsteadiness of gait   Plan of Care:  1. Patient evaluated.   2.  Appointment with Pedorthist for custom molded diabetic shoes and insoles 3.  Continue management of diabetes and controlling his blood glucose levels 4.  Recommend good foot hygiene 5.  Return to clinic as needed      Edrick Kins, DPM [facility]  Dr. Edrick Kins, DPM    [address]  [place], [place] [address]                Office [phone]  Fax [phone]

## 2019-12-16 ENCOUNTER — Other Ambulatory Visit: Payer: Medicare Other | Admitting: Orthotics

## 2019-12-21 DIAGNOSIS — L821 Other seborrheic keratosis: Secondary | ICD-10-CM | POA: Diagnosis not present

## 2019-12-21 DIAGNOSIS — L820 Inflamed seborrheic keratosis: Secondary | ICD-10-CM | POA: Diagnosis not present

## 2019-12-21 DIAGNOSIS — D225 Melanocytic nevi of trunk: Secondary | ICD-10-CM | POA: Diagnosis not present

## 2019-12-22 DIAGNOSIS — Z03818 Encounter for observation for suspected exposure to other biological agents ruled out: Secondary | ICD-10-CM | POA: Diagnosis not present

## 2019-12-29 ENCOUNTER — Other Ambulatory Visit: Payer: Self-pay

## 2019-12-29 ENCOUNTER — Ambulatory Visit: Payer: Medicare Other | Admitting: Orthotics

## 2019-12-29 DIAGNOSIS — G629 Polyneuropathy, unspecified: Secondary | ICD-10-CM

## 2019-12-29 DIAGNOSIS — E0843 Diabetes mellitus due to underlying condition with diabetic autonomic (poly)neuropathy: Secondary | ICD-10-CM

## 2019-12-29 NOTE — Progress Notes (Signed)

## 2020-01-02 ENCOUNTER — Encounter: Payer: Self-pay | Admitting: Internal Medicine

## 2020-01-02 ENCOUNTER — Ambulatory Visit (INDEPENDENT_AMBULATORY_CARE_PROVIDER_SITE_OTHER): Payer: Medicare Other | Admitting: Internal Medicine

## 2020-01-02 VITALS — BP 102/70 | HR 75 | Ht 72.0 in | Wt 206.0 lb

## 2020-01-02 DIAGNOSIS — Z86010 Personal history of colonic polyps: Secondary | ICD-10-CM | POA: Diagnosis not present

## 2020-01-02 DIAGNOSIS — I2510 Atherosclerotic heart disease of native coronary artery without angina pectoris: Secondary | ICD-10-CM | POA: Diagnosis not present

## 2020-01-02 NOTE — Progress Notes (Signed)
Dominic Kane 79 y.o. 18-Aug-1940 [id number]  Assessment & Plan:   Encounter Diagnosis  Name Primary?  Dominic Kane Hx of adenomatous colonic polyps Yes    We discussed the pros and cons of repeating a colonoscopy.  He is almost 11, he has a history of a few diminutive polyps in his lifetime.  I explained that the odds of colonoscopy prolonging his life and increasing his life expectancy are very low based upon what we know in taking that into consideration with age and comorbidities he and I have decided not to pursue routine repeat colonoscopy but to investigate signs and symptoms as appropriate.  I will see him back as needed.  Health maintenance function updated to reflect no repeat colonoscopy/colon cancer screening anymore.  I appreciate the opportunity to care for this patient. CC: Jinny Sanders, MD    Subjective:   Chief Complaint: History of colon polyps question repeat colonoscopy  HPI This 52 almost 79 year old white man has a history of coronary artery disease and congestive heart failure and diabetes as well as colon polyps.He had 3 diminutive adenomas in 2016 and severe diverticulosis of the colon left greater than right.  Prior to that no polyps in 2010 and he had a 2 mm adenoma in 2007 and I think some polyps prior to that.  He denies any active gastrointestinal symptoms i.e. no bowel habit changes rectal bleeding etc.  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   Allergies  Allergen Reactions  . Codeine Nausea Only  . Sulfonamide Derivatives Hives   Current Meds  Medication Sig  . aspirin EC 81 MG tablet Take 81 mg by mouth daily.  . Blood Glucose Monitoring Suppl (ONE TOUCH ULTRA 2) w/Device KIT Use to check blood sugar daily.  . cetirizine (ZYRTEC) 10 MG tablet Take 10 mg by mouth daily.  . Cholecalciferol (D3 ADULT PO) Take by mouth.  . finasteride (PROSCAR) 5 MG tablet TAKE 1 TABLET DAILY  . furosemide (LASIX) 40 MG tablet Take 1  tablet (40 mg total) by mouth daily.  Dominic Kane glucose blood (ONE TOUCH ULTRA TEST) test strip Use to check blood sugar every other day  E11.9  . levothyroxine (SYNTHROID) 150 MCG tablet Take 1 tablet (150 mcg total) by mouth daily before breakfast.  . Melatonin 5 MG TABS Take 5 mg by mouth at bedtime.   Dominic Kane omeprazole (PRILOSEC) 40 MG capsule TAKE 1 CAPSULE DAILY  . OneTouch Delica Lancets 23V MISC Use to check blood sugar two times a day. Dx: E11.9  . PRESCRIPTION MEDICATION every 4 (four) months. *Antibiotic Injection in Eye*  . ramipril (ALTACE) 2.5 MG capsule Take 1 capsule (2.5 mg total) by mouth daily.  . rosuvastatin (CRESTOR) 10 MG tablet Take 1 tablet (10 mg total) by mouth daily.  Dominic Kane terazosin (HYTRIN) 10 MG capsule TAKE 1 CAPSULE DAILY   Past Medical History:  Diagnosis Date  . Abnormality of gait   . Acute sinusitis, unspecified   . Allergy   . 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 Aortic valve disorders   . Arthritis    Osteoarthritis-bilateral knees, lower back issues occasionaly related to knee issues  . Benign paroxysmal positional vertigo    not in a long time  . Cataract    resolved  . CHF (congestive heart failure) ([place])   . Coronary atherosclerosis of native coronary artery   . Diabetes mellitus without complication ([place])  Diet control only.  Dominic Kane Displacement of lumbar intervertebral disc without myelopathy   . Diverticulosis of colon (without mention of hemorrhage)   . Dysfunction of eustachian tube    Hard of hearing"bilateral hearing aids"  . Enlarged prostate   . GERD (gastroesophageal reflux disease)   . Headache(784.0)   . History of kidney stones    past hx. 15 yrs ago x1  . Hypertrophy of prostate without urinary obstruction and other lower urinary tract symptoms (LUTS)   . Lipoprotein deficiencies   . Other and unspecified hyperlipidemia   . Other primary cardiomyopathies   . Pain in joint, lower leg   . Personal history of colonic  polyps   . Personal history of other diseases of digestive system   . Pes anserinus tendinitis or bursitis    left shoulder remains an issue  . Sinoatrial node dysfunction (HCC)    [name] follows  . 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 Thoracic or lumbosacral neuritis or radiculitis, unspecified   . Unspecified disorder of skin and subcutaneous tissue   . Unspecified essential hypertension   . Unspecified vitamin D deficiency    Past Surgical History:  Procedure Laterality Date  . BASAL CELL CARCINOMA EXCISION  11/2016   Dr. Nevada Crane  . CATARACT EXTRACTION, BILATERAL    . COLONOSCOPY W/ POLYPECTOMY    . DOBUTAMINE STRESS ECHO  1/07   Lateral hypokinesis but no ischemia  . ESOPHAGOGASTRODUODENOSCOPY  11/07   Schatzki's ring, non bleeding erosive gastropathy Charlotte Katy  . KNEE SURGERY Left 1947  . LYMPH NODE DISSECTION N/A 08/05/2016   Procedure: LIMITED LYMPH NODE DISSECTION;  Surgeon: Armandina Gemma, MD;  Location: [facility];  Service: General;  Laterality: N/A;  . Pulmonary functioning tests  1. 2003  2. 2005   1. Diminished lung capacity  2. Stable  . THYROIDECTOMY N/A 08/05/2016   Procedure: TOTAL THYROIDECTOMY;  Surgeon: Armandina Gemma, MD;  Location: [facility];  Service: General;  Laterality: N/A;  . TONSILLECTOMY    . 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   Review of Systems As per HPI Objective:    Physical Exam BP 102/70   Pulse 75   Ht 6' (1.829 m)   Wt 206 lb (93.4 kg)   BMI 27.94 kg/m

## 2020-01-02 NOTE — Patient Instructions (Addendum)
If you are age 79 or older, your body mass index should be between 23-30. Your Body mass index is 27.94 kg/m. If this is out of the aforementioned range listed, please consider follow up with your Primary Care Provider.  No more routine Colonoscopies   Please follow up as needed   Thank you for choosing me and [facility].  Silvano Rusk , MD , [facility]

## 2020-01-04 ENCOUNTER — Ambulatory Visit (INDEPENDENT_AMBULATORY_CARE_PROVIDER_SITE_OTHER): Payer: Medicare Other | Admitting: Endocrinology

## 2020-01-04 ENCOUNTER — Encounter: Payer: Self-pay | Admitting: Endocrinology

## 2020-01-04 ENCOUNTER — Other Ambulatory Visit: Payer: Self-pay

## 2020-01-04 VITALS — BP 134/80 | HR 65 | Ht 72.0 in | Wt 210.0 lb

## 2020-01-04 DIAGNOSIS — C73 Malignant neoplasm of thyroid gland: Secondary | ICD-10-CM | POA: Diagnosis not present

## 2020-01-04 DIAGNOSIS — I2510 Atherosclerotic heart disease of native coronary artery without angina pectoris: Secondary | ICD-10-CM | POA: Diagnosis not present

## 2020-01-04 DIAGNOSIS — E11319 Type 2 diabetes mellitus with unspecified diabetic retinopathy without macular edema: Secondary | ICD-10-CM | POA: Diagnosis not present

## 2020-01-04 LAB — T4, FREE: Free T4: 1 ng/dL (ref 0.60–1.60)

## 2020-01-04 LAB — HEMOGLOBIN A1C: Hgb A1c MFr Bld: 6.7 % — ABNORMAL HIGH (ref 4.6–6.5)

## 2020-01-04 LAB — TSH: TSH: 3.85 u[IU]/mL (ref 0.35–4.50)

## 2020-01-04 NOTE — Patient Instructions (Signed)
Blood tests are requested for you today.  We'll let you know about the results.  If the blood sugar is high, i'll prescribe a pill called "metformin."   If the cancer test is worse, let's check the ultrasound.   Please come back for a follow-up appointment in 6 months.

## 2020-01-04 NOTE — Progress Notes (Signed)
Subjective:    Patient ID: Dominic Kane, male    DOB: 1940-08-12, 79 y.o.   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   Other: he has never taken insulin.   Interval history: he was rx'ed prednisone last month, but he did not take.  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   4/18: thyroidectomy:  pT3b, pN0 6/18: RAI, on synthroid, 122 mCi, stimulated by thyrogen.  6/18: post-therapy scan pos at thyroid bed only.  10/18: TG is undetectable (ab pos).  4/19: TG is undetectable (ab pos, but lower titer).   5/20: TG= 0.3 (ab neg).   5/20: TG= 0.2 (ab neg).  He does not notice any neck nodule or pain Postsurgical hypothyroidism: in view of cardiomyopathy and CAD, he is not a candidate for suppressive dosing of synthroid.   Past Medical History:  Diagnosis Date  . Abnormality of gait   . Acute sinusitis, unspecified   . Allergy   . 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 Aortic valve disorders   . Arthritis    Osteoarthritis-bilateral knees, lower back issues occasionaly related to knee issues  . Benign paroxysmal positional vertigo    not in a long time  . Cataract    resolved  . CHF (congestive heart failure) ([place])   . Coronary atherosclerosis of native coronary artery   . Diabetes mellitus without complication ([place])    Diet control only.  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 Diverticulosis of colon (without mention of hemorrhage)   . Dysfunction of eustachian tube    Hard of hearing"bilateral hearing aids"  . Enlarged prostate   . GERD (gastroesophageal reflux disease)   . Headache(784.0)   . History of kidney stones    past hx. 15 yrs ago x1  .  Hypertrophy of prostate without urinary obstruction and other lower urinary tract symptoms (LUTS)   . Lipoprotein deficiencies   . Other and unspecified hyperlipidemia   . Other primary cardiomyopathies   . Pain in joint, lower leg   . Personal history of colonic polyps   . Personal history of other diseases of digestive system   . Pes anserinus tendinitis or bursitis    left shoulder remains an issue  . Sinoatrial node dysfunction (HCC)    [name] follows  . 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 Thoracic or lumbosacral neuritis or radiculitis, unspecified   . Unspecified disorder of skin and subcutaneous tissue   . Unspecified essential hypertension   . Unspecified vitamin D deficiency     Past Surgical History:  Procedure Laterality Date  . BASAL CELL CARCINOMA EXCISION  11/2016   Dr. [place] Crane  . CATARACT EXTRACTION, BILATERAL    . COLONOSCOPY W/ POLYPECTOMY    . DOBUTAMINE STRESS ECHO  1/07   Lateral hypokinesis but no ischemia  . ESOPHAGOGASTRODUODENOSCOPY  11/07   Schatzki's ring, non bleeding erosive gastropathy [place]  . KNEE SURGERY Left 1947  . LYMPH NODE DISSECTION N/A 08/05/2016   Procedure: LIMITED LYMPH NODE DISSECTION;  Surgeon: Armandina Gemma, MD;  Location: [facility];  Service: General;  Laterality: N/A;  . Pulmonary functioning tests  1. 2003  2. 2005   1. Diminished lung capacity  2. Stable  . THYROIDECTOMY N/A 08/05/2016   Procedure: TOTAL THYROIDECTOMY;  Surgeon: Armandina Gemma, MD;  Location: [facility];  Service: General;  Laterality: N/A;  . TONSILLECTOMY    . TOTAL KNEE ARTHROPLASTY Left 03/18/2016   Procedure: LEFT TOTAL KNEE ARTHROPLASTY;  Surgeon: Paralee Cancel, MD;  Location: [facility];  Service: Orthopedics;  Laterality: Left;    Social History   Socioeconomic History  . Marital status: Married    Spouse name: Not on file  . Number of children: 1  . Years of education: Not on file  . Highest education level: Not on file  Occupational  History  . 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 Smoking status: Former Smoker    Quit date: 07/03/1980    Years since quitting: 39.5  . Smokeless tobacco: Never Used  Vaping Use  . Vaping Use: Never used  Substance and Sexual Activity  . Alcohol use: Yes    Alcohol/week: 0.0 standard drinks    Comment: occassionally  . Drug use: No  . Sexual activity: Not on file  Other Topics Concern  . 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 Difficulty of Paying Living Expenses: Not hard at all  Food Insecurity: No Food Insecurity  . Worried About Charity fundraiser in the Last Year: Never true  . Ran Out of Food in the Last Year: Never true  Transportation Needs: No Transportation Needs  . Lack of Transportation (Medical): No  . Lack of Transportation (Non-Medical): No  Physical Activity: Inactive  . Days of Exercise per Week: 0 days  . Minutes of Exercise per Session: 0 min  Stress: Stress Concern Present  . Feeling of Stress : To some extent  Social Connections:   . Frequency of Communication with Friends and Family: Not on file  . Frequency of Social Gatherings with Friends and Family: Not on file  . Attends Religious Services: Not on file  . Active Member of Clubs or Organizations: Not on file  . Attends Archivist Meetings: Not on file  . Marital Status: Not on file  Intimate Partner Violence: Not At Risk  . Fear of Current or Ex-Partner: No  . Emotionally Abused: No  . Physically Abused: No  . Sexually Abused: No    Current Outpatient Medications on File Prior to Visit  Medication Sig Dispense Refill  . aspirin EC 81 MG tablet Take 81 mg by mouth daily.    . Blood Glucose  Monitoring Suppl (ONE TOUCH ULTRA 2) w/Device KIT Use to check blood sugar daily. 1 each 0  . cetirizine (ZYRTEC) 10 MG tablet Take 10 mg by mouth daily.    . Cholecalciferol (D3 ADULT PO) Take by mouth.    . finasteride (PROSCAR) 5 MG tablet TAKE 1 TABLET DAILY 90 tablet 1  . furosemide (LASIX) 40 MG tablet Take 1 tablet (40 mg total) by mouth daily. 90 tablet 3  . glucose blood (ONE TOUCH ULTRA TEST) test strip Use to check blood sugar every other day  E11.9 200 each 3  . levothyroxine (SYNTHROID) 150 MCG tablet Take 1 tablet (150 mcg total) by  mouth daily before breakfast. 90 tablet 3  . Melatonin 5 MG TABS Take 5 mg by mouth at bedtime.     [name] omeprazole (PRILOSEC) 40 MG capsule TAKE 1 CAPSULE DAILY 90 capsule 3  . OneTouch Delica Lancets 10Y MISC Use to check blood sugar two times a day. Dx: E11.9 200 each 3  . PRESCRIPTION MEDICATION every 4 (four) months. *Antibiotic Injection in Eye*    . ramipril (ALTACE) 2.5 MG capsule Take 1 capsule (2.5 mg total) by mouth daily. 90 capsule 3  . rosuvastatin (CRESTOR) 10 MG tablet Take 1 tablet (10 mg total) by mouth daily. 90 tablet 3  . terazosin (HYTRIN) 10 MG capsule TAKE 1 CAPSULE DAILY 90 capsule 3   No current facility-administered medications on file prior to visit.    Allergies  Allergen Reactions  . Codeine Nausea Only  . Sulfonamide Derivatives Hives    Family History  Problem Relation Age of Onset  . Heart failure Mother   . Coronary artery disease Mother   . Stroke Mother   . Hypothyroidism Mother   . Cancer Father        LUNG  . Cancer Sister        BREAST  . Heart failure Maternal Grandmother   . Hypothyroidism Daughter   . Colon cancer Neg Hx     BP 134/80   Pulse 65   Ht 6' (1.829 m)   Wt 210 lb (95.3 kg)   SpO2 94%   BMI 28.48 kg/m    Review of Systems     Objective:   Physical Exam VITAL SIGNS:  See vs page GENERAL: no distress Neck: a healed scar is present.  I do not appreciate a nodule in the  thyroid or elsewhere in the neck  Lab Results  Component Value Date   TSH 3.85 01/04/2020      Assessment & Plan:  Hypothyroidism: well-replaced.  Please continue the same synthroid PTC: recheck today.

## 2020-01-05 LAB — THYROGLOBULIN ANTIBODY: Thyroglobulin Ab: <1 IU/mL (ref <=1)

## 2020-01-05 LAB — THYROGLOBULIN LEVEL: Thyroglobulin: 0.3 ng/mL — ABNORMAL LOW

## 2020-01-12 ENCOUNTER — Ambulatory Visit (INDEPENDENT_AMBULATORY_CARE_PROVIDER_SITE_OTHER): Payer: Medicare Other | Admitting: Cardiovascular Disease

## 2020-01-12 ENCOUNTER — Other Ambulatory Visit: Payer: Self-pay

## 2020-01-12 ENCOUNTER — Encounter: Payer: Self-pay | Admitting: Cardiovascular Disease

## 2020-01-12 VITALS — BP 116/60 | HR 58 | Ht 72.0 in | Wt 211.4 lb

## 2020-01-12 DIAGNOSIS — E7800 Pure hypercholesterolemia, unspecified: Secondary | ICD-10-CM

## 2020-01-12 DIAGNOSIS — I2510 Atherosclerotic heart disease of native coronary artery without angina pectoris: Secondary | ICD-10-CM | POA: Diagnosis not present

## 2020-01-12 DIAGNOSIS — I428 Other cardiomyopathies: Secondary | ICD-10-CM | POA: Diagnosis not present

## 2020-01-12 DIAGNOSIS — I5022 Chronic systolic (congestive) heart failure: Secondary | ICD-10-CM | POA: Diagnosis not present

## 2020-01-12 DIAGNOSIS — I712 Thoracic aortic aneurysm, without rupture, unspecified: Secondary | ICD-10-CM

## 2020-01-12 NOTE — Progress Notes (Signed)
i  Chief Complaint  Patient presents with  . Follow-up    CAD   History of Present Illness: 79 yo male with a past medical history significant for diastolic and systolic CHF, hypertension, nonobstructive coronary artery disease, thoracic aortic aneurysm, non-ischemic cardiomyopathy, benign prostatic hypertrophy and hyperlipidemia who is here today for cardiac follow up. Prior to 2005, he was known to have LVEF around 35%. He had an episode of chest pain in 2005 leading to a cardiac cath May 2005 which showed diffuse 15% irregularities in the LAD, diffuse 20% irregularities in the ramus intermedius branch, diffuse 30% irregularities in the nondominant circumflex, and a 50% stenosis in the proximal RCA with irregularities in the midportion of the vessel. The ascending aorta was mildly dilated with no evidence of dissection during that catheterization. Ejection fraction was noted to be 65%. An echocardiogram during that admission in May 2005 in [place] showed an ejection fraction of 55-60% with the ascending aorta at the sinotubular junction measuring 4.1 cm. LVEF has been noted to be around 45% on studies in 2010, 2011. Echo September 2013 suggested LVEF was around 30%. Cardiac MRI December 2014 showed mild enlargement of the aortic root (4.2 cm) and and EF of 44%. Last stress test 9/13 without ischemia. Echo May 2021 with LVEF=30-35% with global hypokinesis. Mild AI. Mild dilation of the ascending aorta. Nuclear stress test May 2021 with no ischemia. Chest CTA May 2021 with 4.5 cm dilated ascending aorta.   He is here today for follow up. The patient denies any chest pain, dyspnea, palpitations, lower extremity edema, orthopnea, PND, dizziness, near syncope or syncope. He is feeling well overall.    Primary Care Physician: Jinny Sanders, MD  Past Medical History:  Diagnosis Date  . Abnormality of gait   . Acute sinusitis, unspecified   . Allergy   . 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 Aortic valve disorders   . Arthritis    Osteoarthritis-bilateral knees, lower back issues occasionaly related to knee issues  . Benign paroxysmal positional vertigo    not in a long time  . Cataract    resolved  . CHF (congestive heart failure) ([place])   . Coronary atherosclerosis of native coronary artery   . Diabetes mellitus without complication ([place])    Diet control only.  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 Diverticulosis of colon (without mention of hemorrhage)   . Dysfunction of eustachian tube    Hard of hearing"bilateral hearing aids"  . Enlarged prostate   . GERD (gastroesophageal reflux disease)   . Headache(784.0)   . History of kidney stones    past hx. 15 yrs ago x1  . Hypertrophy of prostate without urinary obstruction and other lower urinary tract symptoms (LUTS)   . Lipoprotein deficiencies   . Other and unspecified hyperlipidemia   . Other primary cardiomyopathies   . Pain in joint, lower leg   . Personal history of colonic polyps   . Personal history of other diseases of digestive system   . Pes anserinus tendinitis or bursitis    left shoulder remains an issue  . Sinoatrial node dysfunction (HCC)    [name] follows  . 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 Thoracic or lumbosacral neuritis or radiculitis, unspecified   . Unspecified disorder of skin and subcutaneous tissue   . Unspecified essential hypertension   . Unspecified vitamin D deficiency     Past  Surgical History:  Procedure Laterality Date  . BASAL CELL CARCINOMA EXCISION  11/2016   Dr. Nevada Crane  . CATARACT EXTRACTION, BILATERAL    . COLONOSCOPY W/ POLYPECTOMY    . DOBUTAMINE STRESS ECHO  1/07   Lateral hypokinesis but no ischemia  . ESOPHAGOGASTRODUODENOSCOPY  11/07   Schatzki's ring, non bleeding erosive gastropathy [place]  . KNEE SURGERY Left 1947  . LYMPH NODE DISSECTION N/A 08/05/2016   Procedure: LIMITED LYMPH NODE  DISSECTION;  Surgeon: Armandina Gemma, MD;  Location: [facility];  Service: General;  Laterality: N/A;  . Pulmonary functioning tests  1. 2003  2. 2005   1. Diminished lung capacity  2. Stable  . THYROIDECTOMY N/A 08/05/2016   Procedure: TOTAL THYROIDECTOMY;  Surgeon: Armandina Gemma, MD;  Location: [facility];  Service: General;  Laterality: N/A;  . TONSILLECTOMY    . TOTAL KNEE ARTHROPLASTY Left 03/18/2016   Procedure: LEFT TOTAL KNEE ARTHROPLASTY;  Surgeon: Paralee Cancel, MD;  Location: [facility];  Service: Orthopedics;  Laterality: Left;    Current Outpatient Medications  Medication Sig Dispense Refill  . aspirin EC 81 MG tablet Take 81 mg by mouth daily.    . Blood Glucose Monitoring Suppl (ONE TOUCH ULTRA 2) w/Device KIT Use to check blood sugar daily. 1 each 0  . cetirizine (ZYRTEC) 10 MG tablet Take 10 mg by mouth daily.    . Cholecalciferol (D3 ADULT PO) Take by mouth.    . finasteride (PROSCAR) 5 MG tablet TAKE 1 TABLET DAILY 90 tablet 1  . furosemide (LASIX) 40 MG tablet Take 1 tablet (40 mg total) by mouth daily. 90 tablet 3  . glucose blood (ONE TOUCH ULTRA TEST) test strip Use to check blood sugar every other day  E11.9 200 each 3  . levothyroxine (SYNTHROID) 150 MCG tablet Take 1 tablet (150 mcg total) by mouth daily before breakfast. 90 tablet 3  . Melatonin 5 MG TABS Take 5 mg by mouth at bedtime.     [name] omeprazole (PRILOSEC) 40 MG capsule TAKE 1 CAPSULE DAILY 90 capsule 3  . OneTouch Delica Lancets 93O MISC Use to check blood sugar two times a day. Dx: E11.9 200 each 3  . PRESCRIPTION MEDICATION every 4 (four) months. *Antibiotic Injection in Eye*    . ramipril (ALTACE) 2.5 MG capsule Take 1 capsule (2.5 mg total) by mouth daily. 90 capsule 3  . rosuvastatin (CRESTOR) 10 MG tablet Take 1 tablet (10 mg total) by mouth daily. 90 tablet 3  . terazosin (HYTRIN) 10 MG capsule TAKE 1 CAPSULE DAILY 90 capsule 3   No current facility-administered medications for this visit.    Allergies    Allergen Reactions  . Codeine Nausea Only  . Sulfonamide Derivatives Hives    Social History   Socioeconomic History  . Marital status: Married    Spouse name: Not on file  . Number of children: 1  . Years of education: Not on file  . Highest education level: Not on file  Occupational History  . 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 Smoking status: Former Smoker    Quit date: 07/03/1980    Years since quitting: 39.5  . Smokeless tobacco: Never Used  Vaping Use  . Vaping Use: Never used  Substance and Sexual Activity  . Alcohol use: Yes    Alcohol/week: 0.0 standard drinks    Comment: occassionally  . Drug use: No  . Sexual activity: Not on file  Other Topics Concern  . 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 Difficulty of Paying Living Expenses: Not hard at all  Food Insecurity: No Food Insecurity  . Worried About Charity fundraiser in the Last Year: Never true  . Ran Out of Food in the Last Year: Never true  Transportation Needs: No Transportation Needs  . Lack of Transportation (Medical): No  . Lack of Transportation (Non-Medical): No  Physical Activity: Inactive  . Days of Exercise per Week: 0 days  . Minutes of Exercise per Session: 0 min  Stress: Stress Concern Present  . Feeling of Stress : To some extent  Social Connections:   . Frequency of Communication with Friends and Family: Not on file  . Frequency of Social Gatherings with Friends and Family: Not on file  . Attends Religious Services: Not on file  . Active Member of Clubs or Organizations: Not on file  . Attends Archivist Meetings: Not on file  . Marital Status: Not on file  Intimate Partner Violence:  Not At Risk  . Fear of Current or Ex-Partner: No  . Emotionally Abused: No  . Physically Abused: No  . Sexually Abused: No    Family History  Problem Relation Age of Onset  . Heart failure Mother   . Coronary artery disease Mother   . Stroke Mother   . Hypothyroidism Mother   . Cancer Father        LUNG  . Cancer Sister        BREAST  . Heart failure Maternal Grandmother   . Hypothyroidism Daughter   . Colon cancer Neg Hx     Review of Systems:  As stated in the HPI and otherwise negative.   BP 116/60   Pulse (!) 58   Ht 6' (1.829 m)   Wt 211 lb 6.4 oz (95.9 kg)   SpO2 94%   BMI 28.67 kg/m   Physical Examination:  General: Well developed, well nourished, NAD  HEENT: OP clear, mucus membranes moist  SKIN: warm, dry. No rashes. Neuro: No focal deficits  Musculoskeletal: Muscle strength 5/5 all ext  Psychiatric: Mood and affect normal  Neck: No JVD, no carotid bruits, no thyromegaly, no lymphadenopathy.  Lungs:Clear bilaterally, no wheezes, rhonci, crackles Cardiovascular: Regular rate and rhythm. No murmurs, gallops or rubs. Abdomen:Soft. Bowel sounds present. Non-tender.  Extremities: No lower extremity edema. Pulses are 2 + in the bilateral DP/PT.  Echo 08/10/19:  1. Left ventricular ejection fraction, by estimation, is 30 to 35%. The  left ventricle has moderately decreased function. The left ventricle has  no regional wall motion abnormalities. There is mild concentric left  ventricular hypertrophy. Left  ventricular diastolic parameters are consistent with Grade I diastolic  dysfunction (impaired relaxation). Elevated left ventricular end-diastolic  pressure.  2. Right ventricular systolic function is normal. The right ventricular  size is normal. There is normal pulmonary artery systolic pressure.  3. Left atrial size was moderately dilated.  4. The mitral valve is normal in structure. Trivial mitral valve  regurgitation. No evidence of mitral  stenosis.  5. The aortic valve is tricuspid. Aortic valve regurgitation is mild. No  aortic  stenosis is present.  6. Aortic dilatation noted. 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  7. The inferior vena cava is normal in size with greater than 50%  respiratory variability, suggesting right atrial pressure of 3 mmHg.   EKG:  EKG is not ordered today. The ekg ordered today demonstrates:   Recent Labs: 02/16/2019: ALT 14 07/20/2019: BUN 18; Creatinine, Ser 1.02; Potassium 3.9; Sodium 140 01/04/2020: TSH 3.85   Lipid Panel    Component Value Date/Time   CHOL 122 08/15/2019 0902   TRIG 91.0 08/15/2019 0902   HDL 36.70 (L) 08/15/2019 0902   CHOLHDL 3 08/15/2019 0902   VLDL 18.2 08/15/2019 0902   LDLCALC 67 08/15/2019 0902     Wt Readings from Last 3 Encounters:  01/12/20 211 lb 6.4 oz (95.9 kg)  01/04/20 210 lb (95.3 kg)  01/02/20 206 lb (93.4 kg)     Other studies Reviewed: Additional studies/ records that were reviewed today include: . Review of the above records demonstrates:    Assessment and Plan:   1. CAD without angina:  He is known to have non-obstructive disease by cath in 2005. Nuclear stress test in May 2021 with no ischemia. Continue ASA and statin.    2. Non-ischemic Cardiomyopathy: LVEF around 35% on echo May 2021 and 46% on nuclear stress test May 2021. He is not on a beta blocker due to bradycardia. Will continue the Ace-inh.   3. Thoracic aortic aneurysm:  4.5 cm by CTA May 2021. Will repeat in May 2022.   4. HLD: LDL is at goal in May 2021. Will continue statin  5. Chronic systolic CHF: No evidence of volume overload. Will continue Lasix.   6. Carotid bruit, right: Carotid dopplers January 2016 with no evidence of carotid artery disease.   7. Diabetes, type 2: diet controlled. Followed in primary care.   Current medicines are reviewed at length with the patient today.  The patient does not have concerns regarding  medicines.  The following changes have been made:  no change  Labs/ tests ordered today include:   Orders Placed This Encounter  Procedures  . CT ANGIO CHEST AORTA W/CM & OR WO/CM  . Basic metabolic panel   Disposition:   FU with me in 12  months  Signed, Lauree Chandler, MD 01/12/2020 9:26 AM    [facility] [address] [place], [place], [place]  [address] Phone: [phone]; Fax: [phone]

## 2020-01-12 NOTE — Patient Instructions (Signed)
Medication Instructions:  NO CHANGES *If you need a refill on your cardiac medications before your next appointment, please call your pharmacy*   Lab Work: none If you have labs (blood work) drawn today and your tests are completely normal, you will receive your results only by: [name] MyChart Message (if you have MyChart) OR . A paper copy in the mail If you have any lab test that is abnormal or we need to change your treatment, we will call you to review the results.   Testing/Procedures:CHEST CT - DUE IN MAY 2022 Non-Cardiac CT Angiography (CTA), is a special type of CT scan that uses a computer to produce multi-dimensional views of major blood vessels throughout the body. In CT angiography, a contrast material is injected through an IV to help visualize the blood vessels   Follow-Up: At [facility], you and your health needs are our priority.  As part of our continuing mission to provide you with exceptional heart care, we have created designated Provider Care Teams.  These Care Teams include your primary Cardiologist (physician) and Advanced Practice Providers (APPs -  Physician Assistants and Nurse Practitioners) who all work together to provide you with the care you need, when you need it.   Your next appointment:   12 month(s)  The format for your next appointment:   In Person  Provider:   You may see Lauree Chandler, MD or one of the following Advanced Practice Providers on your designated Care Team.  Other Instructions

## 2020-02-07 ENCOUNTER — Ambulatory Visit (INDEPENDENT_AMBULATORY_CARE_PROVIDER_SITE_OTHER)
Admission: RE | Admit: 2020-02-07 | Discharge: 2020-02-07 | Disposition: A | Discharge status: Home / Self Care | Payer: Medicare Other | Source: Ambulatory Visit | Attending: Family Medicine | Admitting: Family Medicine

## 2020-02-07 ENCOUNTER — Encounter: Payer: Self-pay | Admitting: Family Medicine

## 2020-02-07 ENCOUNTER — Other Ambulatory Visit: Payer: Self-pay

## 2020-02-07 ENCOUNTER — Ambulatory Visit (INDEPENDENT_AMBULATORY_CARE_PROVIDER_SITE_OTHER): Payer: Medicare Other | Admitting: Family Medicine

## 2020-02-07 VITALS — BP 110/70 | HR 58 | Temp 98.5°F | Ht 72.0 in | Wt 212.5 lb

## 2020-02-07 DIAGNOSIS — M5416 Radiculopathy, lumbar region: Secondary | ICD-10-CM | POA: Diagnosis not present

## 2020-02-07 DIAGNOSIS — I2510 Atherosclerotic heart disease of native coronary artery without angina pectoris: Secondary | ICD-10-CM | POA: Diagnosis not present

## 2020-02-07 DIAGNOSIS — M545 Low back pain, unspecified: Secondary | ICD-10-CM | POA: Diagnosis not present

## 2020-02-07 MED ORDER — PREDNISONE 20 MG PO TABS: ORAL_TABLET | ORAL | 0 refills | Status: DC

## 2020-02-07 NOTE — Progress Notes (Signed)
Chief Complaint  Patient presents with  . Back Pain    History of Present Illness: HPI    79 year old male with  history of lumbar back pain with  Radiculopathy, osteoarthritis, tyroid cancer and diabetes with neuropathy. presents with  continued pain in low back.   At last OV in 10/2019 for similar pain... Treated with home PT, tylenol and heat.  Pain in in mid lower back  with pain into  bilateral hips.  worse with cold. Pain worse with going from sitting to standing. Better with walking some.   No new numbness or weakness.   3 weeks ago had a fall, slid and landed on rear end... no change in pain.   FBS 106   This visit occurred during the SARS-CoV-2 public health emergency.  Safety protocols were in place, including screening questions prior to the visit, additional usage of staff PPE, and extensive cleaning of exam room while observing appropriate contact time as indicated for disinfecting solutions.   COVID 19 screen:  No recent travel or known exposure to COVID19 The patient denies respiratory symptoms of COVID 19 at this time. The importance of social distancing was discussed today.     Review of Systems  Constitutional: Negative for chills and fever.  HENT: Negative for congestion and ear pain.   Eyes: Negative for pain and redness.  Respiratory: Negative for cough and shortness of breath.   Cardiovascular: Negative for chest pain, palpitations and leg swelling.  Gastrointestinal: Negative for abdominal pain, blood in stool, constipation, diarrhea, nausea and vomiting.  Genitourinary: Negative for dysuria.  Musculoskeletal: Negative for falls and myalgias.  Skin: Negative for rash.  Neurological: Negative for dizziness.  Psychiatric/Behavioral: Negative for depression. The patient is not nervous/anxious.       Past Medical History:  Diagnosis Date  . Abnormality of gait   . Acute sinusitis, unspecified   . Allergy   . 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 Aortic valve disorders   . Arthritis    Osteoarthritis-bilateral knees, lower back issues occasionaly related to knee issues  . Benign paroxysmal positional vertigo    not in a long time  . Cataract    resolved  . CHF (congestive heart failure) ([place])   . Coronary atherosclerosis of native coronary artery   . Diabetes mellitus without complication ([place])    Diet control only.  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 Diverticulosis of colon (without mention of hemorrhage)   . Dysfunction of eustachian tube    Hard of hearing"bilateral hearing aids"  . Enlarged prostate   . GERD (gastroesophageal reflux disease)   . Headache(784.0)   . History of kidney stones    past hx. 15 yrs ago x1  . Hypertrophy of prostate without urinary obstruction and other lower urinary tract symptoms (LUTS)   . Lipoprotein deficiencies   . Other and unspecified hyperlipidemia   . Other primary cardiomyopathies   . Pain in joint, lower leg   . Personal history of colonic polyps   . Personal history of other diseases of digestive system   . Pes anserinus tendinitis or bursitis    left shoulder remains an issue  . Sinoatrial node dysfunction (HCC)    [name] follows  . 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 Thoracic or lumbosacral neuritis or radiculitis, unspecified   . Unspecified disorder of skin and subcutaneous tissue   . Unspecified essential hypertension   .  Unspecified vitamin D deficiency     reports that he quit smoking about 39 years ago. He has never used smokeless tobacco. He reports current alcohol use. He reports that he does not use drugs.   Current Outpatient Medications:  .  aspirin EC 81 MG tablet, Take 81 mg by mouth daily., Disp: , Rfl:  .  Blood Glucose Monitoring Suppl (ONE TOUCH ULTRA 2) w/Device KIT, Use to check blood sugar daily., Disp: 1 each, Rfl: 0 .  cetirizine (ZYRTEC) 10 MG tablet, Take 10 mg by mouth daily.,  Disp: , Rfl:  .  Cholecalciferol (D3 ADULT PO), Take by mouth., Disp: , Rfl:  .  finasteride (PROSCAR) 5 MG tablet, TAKE 1 TABLET DAILY, Disp: 90 tablet, Rfl: 1 .  furosemide (LASIX) 40 MG tablet, Take 1 tablet (40 mg total) by mouth daily., Disp: 90 tablet, Rfl: 3 .  glucose blood (ONE TOUCH ULTRA TEST) test strip, Use to check blood sugar every other day  E11.9, Disp: 200 each, Rfl: 3 .  levothyroxine (SYNTHROID) 150 MCG tablet, Take 1 tablet (150 mcg total) by mouth daily before breakfast., Disp: 90 tablet, Rfl: 3 .  Melatonin 5 MG TABS, Take 5 mg by mouth at bedtime. , Disp: , Rfl:  .  omeprazole (PRILOSEC) 40 MG capsule, TAKE 1 CAPSULE DAILY, Disp: 90 capsule, Rfl: 3 .  OneTouch Delica Lancets 80S MISC, Use to check blood sugar two times a day. Dx: E11.9, Disp: 200 each, Rfl: 3 .  PRESCRIPTION MEDICATION, every 4 (four) months. *Antibiotic Injection in Eye*, Disp: , Rfl:  .  ramipril (ALTACE) 2.5 MG capsule, Take 1 capsule (2.5 mg total) by mouth daily., Disp: 90 capsule, Rfl: 3 .  rosuvastatin (CRESTOR) 10 MG tablet, Take 1 tablet (10 mg total) by mouth daily., Disp: 90 tablet, Rfl: 3 .  terazosin (HYTRIN) 10 MG capsule, TAKE 1 CAPSULE DAILY, Disp: 90 capsule, Rfl: 3   Observations/Objective: Blood pressure 110/70, pulse (!) 58, temperature 98.5 F (36.9 C), temperature source Temporal, height 6' (1.829 m), weight 212 lb 8 oz (96.4 kg), SpO2 94 %.  Physical Exam Constitutional:      Appearance: He is well-developed.  HENT:     Head: Normocephalic.     Right Ear: Hearing normal.     Left Ear: Hearing normal.     Nose: Nose normal.  Neck:     Thyroid: No thyroid mass or thyromegaly.     Vascular: No carotid bruit.     Trachea: Trachea normal.  Cardiovascular:     Rate and Rhythm: Normal rate and regular rhythm.     Pulses: Normal pulses.     Heart sounds: Heart sounds not distant. No murmur heard.  No friction rub. No gallop.      Comments: No peripheral edema Pulmonary:      Effort: Pulmonary effort is normal. No respiratory distress.     Breath sounds: Normal breath sounds.  Musculoskeletal:     Thoracic back: Normal.     Lumbar back: Tenderness and bony tenderness present. Normal range of motion. Positive left straight leg raise test. Negative right straight leg raise test.  Skin:    General: Skin is warm and dry.     Findings: No rash.  Psychiatric:        Speech: Speech normal.        Behavior: Behavior normal.        Thought Content: Thought content normal.      Assessment and Plan  Lumbar back pain with radiculopathy affecting left lower extremity Eval with X-ray given age, fall and history of thyroid cancer. Treat with pred taper, home pt and heat.  If not improving refer  To PMR and possible steroid injection.     Eliezer Lofts, MD

## 2020-02-07 NOTE — Patient Instructions (Signed)
We will call with X-ray result .  Complete prednisone taper.  Continue heat and  low back stretching.  Call if pain not improving as expected.

## 2020-02-07 NOTE — Assessment & Plan Note (Signed)
Eval with X-ray given age, fall and history of thyroid cancer. Treat with pred taper, home pt and heat.  If not improving refer  To PMR and possible steroid injection.

## 2020-02-20 DIAGNOSIS — Z23 Encounter for immunization: Secondary | ICD-10-CM | POA: Diagnosis not present

## 2020-02-27 ENCOUNTER — Encounter (INDEPENDENT_AMBULATORY_CARE_PROVIDER_SITE_OTHER): Payer: Medicare Other | Admitting: Ophthalmology

## 2020-02-27 ENCOUNTER — Other Ambulatory Visit: Payer: Self-pay

## 2020-02-27 DIAGNOSIS — H43813 Vitreous degeneration, bilateral: Secondary | ICD-10-CM

## 2020-02-27 DIAGNOSIS — H33301 Unspecified retinal break, right eye: Secondary | ICD-10-CM | POA: Diagnosis not present

## 2020-02-27 DIAGNOSIS — H35033 Hypertensive retinopathy, bilateral: Secondary | ICD-10-CM

## 2020-02-27 DIAGNOSIS — I10 Essential (primary) hypertension: Secondary | ICD-10-CM | POA: Diagnosis not present

## 2020-02-27 DIAGNOSIS — H34811 Central retinal vein occlusion, right eye, with macular edema: Secondary | ICD-10-CM | POA: Diagnosis not present

## 2020-02-27 DIAGNOSIS — H35372 Puckering of macula, left eye: Secondary | ICD-10-CM | POA: Diagnosis not present

## 2020-03-15 ENCOUNTER — Telehealth: Payer: Self-pay | Admitting: Podiatry

## 2020-03-15 NOTE — Telephone Encounter (Signed)
Pts wife called checking on status of diabetic shoes since it has be a few months since they came in to be measured for them.  Upon looking the paperwork was sent to Dr Diona Browner and she noted to send to Dr Loanne Drilling pts endocrinologist. I explained this and told pts wife Dr Loanne Drilling generally does not sign off on the diabetic shoes.

## 2020-03-30 ENCOUNTER — Encounter: Payer: Self-pay | Admitting: Family Medicine

## 2020-03-30 ENCOUNTER — Ambulatory Visit (INDEPENDENT_AMBULATORY_CARE_PROVIDER_SITE_OTHER): Payer: Medicare Other | Admitting: Family Medicine

## 2020-03-30 ENCOUNTER — Ambulatory Visit (INDEPENDENT_AMBULATORY_CARE_PROVIDER_SITE_OTHER)
Admission: RE | Admit: 2020-03-30 | Discharge: 2020-03-30 | Disposition: A | Discharge status: Home / Self Care | Payer: Medicare Other | Source: Ambulatory Visit | Attending: Family Medicine | Admitting: Family Medicine

## 2020-03-30 ENCOUNTER — Other Ambulatory Visit: Payer: Self-pay

## 2020-03-30 VITALS — BP 144/78 | HR 73 | Temp 96.6°F | Ht 72.0 in | Wt 214.0 lb

## 2020-03-30 DIAGNOSIS — M25552 Pain in left hip: Secondary | ICD-10-CM

## 2020-03-30 NOTE — Assessment & Plan Note (Signed)
Hip bursitis vs hip osteoarthritis - discomfort with testing for both. Check baseline hip xrays. rec OTC voltaren gel to lateral hip, schedule tylenol 650mg  TID, provided with exercises for hip bursitis. Discussed return to sports medicine if not improving with treatment. Pt agrees with plan.

## 2020-03-30 NOTE — Patient Instructions (Addendum)
Hip pain could be coming from bursitis or from hip wear and tear arthritis.  Continue tylenol 650mg  three times daily with meals for discomfort. Add on voltaren gel topical anti inflammatory pea sized amount to the lateral hip.  Hip xray today  Exercises provided today for hip bursitis.  If not improving, return to see sports medicine Dr Lorelei Pont.

## 2020-03-30 NOTE — Progress Notes (Signed)
Patient ID: Dominic Kane, male    DOB: 1940-05-29, 80 y.o.   MRN: [id number]  This visit was conducted in person.  BP (!) 144/78 (BP Location: Right Arm, Patient Position: Sitting, Cuff Size: Normal)   Pulse 73   Temp (!) 96.6 F (35.9 C) (Temporal)   Ht 6' (1.829 m)   Wt 214 lb (97.1 kg)   SpO2 95%   BMI 29.02 kg/m    CC: L hip pain  Subjective:   HPI: Dominic Kane is a 80 y.o. male presenting on 03/30/2020 for Bursitis (Pt is having some pain in his Lt hip which started with pain on 03/26/2020 and the pain is going towards the groin area)   2 wk h/o L lateral hip pain with radiation into L groin.  Denies inciting trauma/injury or falls. No current back pain. No shooting pain down back or to knee.  Has tried salon pas as well as tylenol 619m TID with limited temporary benefit.   He did receive steroid bursal injection into left hip about 4-5 yrs ago with benefit.   This feels different than his previous L sciatica.   H/o CHF and CAD and GERD.  Known diabetic last A1c 6.7%.      Relevant past medical, surgical, family and social history reviewed and updated as indicated. Interim medical history since our last visit reviewed. Allergies and medications reviewed and updated. Outpatient Medications Prior to Visit  Medication Sig Dispense Refill  . aspirin EC 81 MG tablet Take 81 mg by mouth daily.    . Blood Glucose Monitoring Suppl (ONE TOUCH ULTRA 2) w/Device KIT Use to check blood sugar daily. 1 each 0  . cetirizine (ZYRTEC) 10 MG tablet Take 10 mg by mouth daily.    . Cholecalciferol (D3 ADULT PO) Take by mouth.    . finasteride (PROSCAR) 5 MG tablet TAKE 1 TABLET DAILY 90 tablet 1  . furosemide (LASIX) 40 MG tablet Take 1 tablet (40 mg total) by mouth daily. 90 tablet 3  . glucose blood (ONE TOUCH ULTRA TEST) test strip Use to check blood sugar every other day  E11.9 200 each 3  . levothyroxine (SYNTHROID) 150 MCG tablet Take 1 tablet (150 mcg total) by mouth  daily before breakfast. 90 tablet 3  . Melatonin 5 MG TABS Take 5 mg by mouth at bedtime.    .Marland Kitchenomeprazole (PRILOSEC) 40 MG capsule TAKE 1 CAPSULE DAILY 90 capsule 3  . OneTouch Delica Lancets 377AMISC Use to check blood sugar two times a day. Dx: E11.9 200 each 3  . PRESCRIPTION MEDICATION every 4 (four) months. *Antibiotic Injection in Eye*    . ramipril (ALTACE) 2.5 MG capsule Take 1 capsule (2.5 mg total) by mouth daily. 90 capsule 3  . rosuvastatin (CRESTOR) 10 MG tablet Take 1 tablet (10 mg total) by mouth daily. 90 tablet 3  . terazosin (HYTRIN) 10 MG capsule TAKE 1 CAPSULE DAILY 90 capsule 3  . predniSONE (DELTASONE) 20 MG tablet 3 tabs by mouth daily x 3 days, then 2 tabs by mouth daily x 2 days then 1 tab by mouth daily x 2 days 15 tablet 0   No facility-administered medications prior to visit.     Per HPI unless specifically indicated in ROS section below Review of Systems Objective:  BP (!) 144/78 (BP Location: Right Arm, Patient Position: Sitting, Cuff Size: Normal)   Pulse 73   Temp (!) 96.6 F (35.9 C) (Temporal)  Ht 6' (1.829 m)   Wt 214 lb (97.1 kg)   SpO2 95%   BMI 29.02 kg/m   Wt Readings from Last 3 Encounters:  03/30/20 214 lb (97.1 kg)  02/07/20 212 lb 8 oz (96.4 kg)  01/12/20 211 lb 6.4 oz (95.9 kg)      Physical Exam Vitals and nursing note reviewed.  Constitutional:      Appearance: Normal appearance. He is not ill-appearing.  Musculoskeletal:        General: No swelling or tenderness. Normal range of motion.     Right lower leg: No edema.     Left lower leg: No edema.     Comments:  No pain midline spine No paraspinous mm tenderness Neg SLR bilaterally. Mild discomfort with int/ext rotation at hip. No pain at SIJ, or sciatic notch bilaterally.  Discomfort to palpation of L GTB and inferior to this, no pain to lateral knee or distal lateral thigh  Skin:    General: Skin is warm and dry.     Findings: No rash.  Neurological:     Mental Status:  He is alert.     Comments: 5/5 strength BLE  Psychiatric:        Mood and Affect: Mood normal.        Behavior: Behavior normal.       Assessment & Plan:  This visit occurred during the SARS-CoV-2 public health emergency.  Safety protocols were in place, including screening questions prior to the visit, additional usage of staff PPE, and extensive cleaning of exam room while observing appropriate contact time as indicated for disinfecting solutions.   Problem List Items Addressed This Visit    Lateral pain of left hip - Primary    Hip bursitis vs hip osteoarthritis - discomfort with testing for both. Check baseline hip xrays. rec OTC voltaren gel to lateral hip, schedule tylenol 622m TID, provided with exercises for hip bursitis. Discussed return to sports medicine if not improving with treatment. Pt agrees with plan.       Relevant Orders   DG Hip Unilat W OR W/O Pelvis 2-3 Views Left       No orders of the defined types were placed in this encounter.  Orders Placed This Encounter  Procedures  . DG Hip Unilat W OR W/O Pelvis 2-3 Views Left    Standing Status:   Future    Standing Expiration Date:   03/30/2021    Order Specific Question:   Reason for Exam (SYMPTOM  OR DIAGNOSIS REQUIRED)    Answer:   L hip pain eval arthritis    Order Specific Question:   Preferred imaging location?    Answer:   [facility]   Patient Instructions  Hip pain could be coming from bursitis or from hip wear and tear arthritis.  Continue tylenol 6511mthree times daily with meals for discomfort. Add on voltaren gel topical anti inflammatory pea sized amount to the lateral hip.  Hip xray today  Exercises provided today for hip bursitis.  If not improving, return to see sports medicine Dr CoLorelei Pont   Follow up plan: Return if symptoms worsen or fail to improve.  JaRia BushMD

## 2020-04-03 ENCOUNTER — Other Ambulatory Visit: Payer: Self-pay | Admitting: Family Medicine

## 2020-04-03 NOTE — Telephone Encounter (Signed)
Spoke with Patient scheduled MWV with nurse and CPE 

## 2020-04-03 NOTE — Telephone Encounter (Signed)
Please call and schedule MWV with nurse and CPE with Dr. Bedsole. 

## 2020-04-04 ENCOUNTER — Ambulatory Visit: Payer: Medicare Other | Admitting: Family Medicine

## 2020-04-09 ENCOUNTER — Ambulatory Visit: Payer: Medicare Other | Admitting: Cardiovascular Disease

## 2020-04-18 ENCOUNTER — Ambulatory Visit: Payer: Medicare Other | Admitting: Family Medicine

## 2020-04-19 ENCOUNTER — Other Ambulatory Visit: Payer: Self-pay

## 2020-04-19 ENCOUNTER — Encounter: Payer: Self-pay | Admitting: Family Medicine

## 2020-04-19 ENCOUNTER — Ambulatory Visit (INDEPENDENT_AMBULATORY_CARE_PROVIDER_SITE_OTHER): Payer: Medicare Other | Admitting: Family Medicine

## 2020-04-19 VITALS — BP 110/60 | HR 66 | Temp 98.2°F | Ht 72.0 in | Wt 206.5 lb

## 2020-04-19 DIAGNOSIS — M7062 Trochanteric bursitis, left hip: Secondary | ICD-10-CM | POA: Diagnosis not present

## 2020-04-19 DIAGNOSIS — M549 Dorsalgia, unspecified: Secondary | ICD-10-CM

## 2020-04-19 DIAGNOSIS — M1610 Unilateral primary osteoarthritis, unspecified hip: Secondary | ICD-10-CM

## 2020-04-19 MED ORDER — METHYLPREDNISOLONE ACETATE 40 MG/ML IJ SUSP
80.0000 mg | Freq: Once | INTRAMUSCULAR | Status: AC
  Administered 2020-04-19: 80 mg via INTRA_ARTICULAR

## 2020-04-19 NOTE — Progress Notes (Signed)
Dominic Kane T. Dianey Suchy, MD, CAQ Sports Medicine  Primary Care and Sports Medicine [facility] at [facility] [address] [place], [address]  Phone: [phone]  FAX: [phone]  Dominic Kane - 80 y.o. male  MRN [id number]  Date of Birth: Jul 05, 1940  Date: 04/19/2020  PCP: Excell Seltzer, MD  Referral: Excell Seltzer, MD  Chief Complaint  Patient presents with  . Back Pain  . Hip Pain    Left - Seen by Dr. Reece Agar on 03/30/2020    This visit occurred during the SARS-CoV-2 public health emergency.  Safety protocols were in place, including screening questions prior to the visit, additional usage of staff PPE, and extensive cleaning of exam room while observing appropriate contact time as indicated for disinfecting solutions.   Subjective:   Dominic Kane is a 80 y.o. very pleasant male patient with Body mass index is 28.01 kg/m. who presents with the following:  He is a very nice 79 year old gentleman, and I am seeing him a few times in the past, most recently in 2017.  Today he presents with some hip pain.  He predominantly is having pain laterally.  He has focal tenderness in this region.  I also pulled his March 30, 2020 hip x-ray myself and on my review he does have some mild osteoarthritis of the left hip.  He does appreciate that cold weather makes it worse, and it will sometimes worse when he is walking and generally moving more than at baseline.  He has some occasional back pain, but right now is not having any significant back pain, he does not have any significant posterior pelvic pain.  He does have some mild groin pain as well.  Review of Systems is noted in the HPI, as appropriate   Objective:   BP 110/60   Pulse 66   Temp 98.2 F (36.8 C) (Temporal)   Ht 6' (1.829 m)   Wt 206 lb 8 oz (93.7 kg)   SpO2 95%   BMI 28.01 kg/m    HIP EXAM: SIDE: Left ROM: Abduction, Flexion, Internal and External range of motion: Mild loss of  motion in all directions Pain with terminal IROM and EROM: None GTB: Notable and focal SLR: NEG Knees: No effusion FABER: NT REVERSE FABER: NT, neg Piriformis: NT at direct palpation Str: flexion: 5/5 abduction: 5/5 adduction: 5/5 Strength testing non-tender   Range of motion at  the waist: Flexion, extension, lateral bending and rotation: Preserved.  No echymosis or edema Rises to examination table with mild difficulty Gait: minimally antalgic  Inspection/Deformity: N Paraspinus Tenderness: Minimal.  B Ankle Dorsiflexion (L5,4): 5/5 B Great Toe Dorsiflexion (L5,4): 5/5 Heel Walk (L5): WNL Toe Walk (S1): WNL Rise/Squat (L4): WNL, mild pain  SENSORY B Medial Foot (L4): WNL B Dorsum (L5): WNL B Lateral (S1): WNL Light Touch: WNL Pinprick: WNL  REFLEXES Knee (L4): 2+ Ankle (S1): 2+  B SLR, seated: neg B SLR, supine: neg B Sciatic Notch: NT     Radiology: DG Hip Unilat W OR W/O Pelvis 2-3 Views Left  Result Date: 03/30/2020 CLINICAL DATA:  Left hip pain. EXAM: DG HIP (WITH OR WITHOUT PELVIS) 2-3V LEFT COMPARISON:  None. FINDINGS: No fracture or dislocation identified. There is evidence of mild to moderate osteoarthritis of the left hip joint with proliferative disease involving the superior acetabulum and joint space narrowing. No evidence of bony destruction or lesions. The bony pelvis is otherwise normal without evidence of abnormality.  IMPRESSION: Mild to moderate osteoarthritis of the left hip joint. Electronically Signed   By: Irish Lack M.D.   On: 03/30/2020 16:25    Assessment and Plan:     ICD-10-CM   1. Trochanteric bursitis of left hip  M70.62 methylPREDNISolone acetate (DEPO-MEDROL) injection 80 mg  2. Primary localized osteoarthritis of hip  M16.10   3. Acute back pain, unspecified back location, unspecified back pain laterality  M54.9    He has classic trochanteric bursitis easily palpable on exam.  While he does have some arthritis on his plain  x-ray, he has no pain with terminal movements at the hip.  Doubtful OA is the primary driver.  Continue with range of motion and strengthening, and will do a GTB injection today.  Aspiration/Injection Procedure Note [name] 1940/10/17 Date of procedure: 04/19/2020  Procedure: Large Joint Aspiration / Injection of Hip, Trochanteric Bursa, L Indications: Pain  Procedure Details Verbal consent obtained. Risks, benefits, and alternatives reviewed. Greater trochanter sterilely prepped with Chloraprep. Ethyl Chloride used for anesthesia. 8 cc of Lidocaine 1% injected with 2 mL of Depo-Medrol 40 mg into trochanteric bursa at area of maximal tenderness at greater trochanter. Needle taken to bone to troch bursa, flows easily. Bursa massaged. No bleeding and no complications. Decreased pain after injection. Needle: 22 gauge spinal needle Medication: 2 mL of Depo-Medrol 40 mg  Meds ordered this encounter  Medications  . methylPREDNISolone acetate (DEPO-MEDROL) injection 80 mg   There are no discontinued medications. No orders of the defined types were placed in this encounter.   Follow-up: No follow-ups on file.  Signed,  Elpidio Galea. Heavenly Christine, MD   Outpatient Encounter Medications as of 04/19/2020  Medication Sig  . aspirin EC 81 MG tablet Take 81 mg by mouth daily.  . Blood Glucose Monitoring Suppl (ONE TOUCH ULTRA 2) w/Device KIT Use to check blood sugar daily.  . cetirizine (ZYRTEC) 10 MG tablet Take 10 mg by mouth daily.  . Cholecalciferol (D3 ADULT PO) Take by mouth.  . finasteride (PROSCAR) 5 MG tablet TAKE 1 TABLET DAILY  . furosemide (LASIX) 40 MG tablet Take 1 tablet (40 mg total) by mouth daily.  [name] glucose blood (ONE TOUCH ULTRA TEST) test strip Use to check blood sugar every other day  E11.9  . levothyroxine (SYNTHROID) 150 MCG tablet Take 1 tablet (150 mcg total) by mouth daily before breakfast.  . Melatonin 5 MG TABS Take 5 mg by mouth at bedtime.  [name] omeprazole  (PRILOSEC) 40 MG capsule TAKE 1 CAPSULE DAILY  . OneTouch Delica Lancets 33G MISC Use to check blood sugar two times a day. Dx: E11.9  . PRESCRIPTION MEDICATION every 4 (four) months. *Antibiotic Injection in Eye*  . ramipril (ALTACE) 2.5 MG capsule Take 1 capsule (2.5 mg total) by mouth daily.  . rosuvastatin (CRESTOR) 10 MG tablet Take 1 tablet (10 mg total) by mouth daily.  [name] terazosin (HYTRIN) 10 MG capsule TAKE 1 CAPSULE BY MOUTH EVERY DAY  . [EXPIRED] methylPREDNISolone acetate (DEPO-MEDROL) injection 80 mg    No facility-administered encounter medications on file as of 04/19/2020.

## 2020-04-21 ENCOUNTER — Telehealth: Payer: Self-pay | Admitting: Family Medicine

## 2020-04-21 DIAGNOSIS — E1169 Type 2 diabetes mellitus with other specified complication: Secondary | ICD-10-CM

## 2020-04-21 DIAGNOSIS — E559 Vitamin D deficiency, unspecified: Secondary | ICD-10-CM

## 2020-04-21 DIAGNOSIS — E11319 Type 2 diabetes mellitus with unspecified diabetic retinopathy without macular edema: Secondary | ICD-10-CM

## 2020-04-21 DIAGNOSIS — E785 Hyperlipidemia, unspecified: Secondary | ICD-10-CM

## 2020-04-21 DIAGNOSIS — E890 Postprocedural hypothyroidism: Secondary | ICD-10-CM

## 2020-04-21 NOTE — Telephone Encounter (Signed)
-----   Message from Ellamae Sia sent at 04/16/2020 11:00 AM EST ----- Regarding: Lab orders for Wednesday, 2.9.22 Patient is scheduled for CPX labs, please order future labs, Thanks , Karna Christmas

## 2020-04-24 ENCOUNTER — Ambulatory Visit (INDEPENDENT_AMBULATORY_CARE_PROVIDER_SITE_OTHER): Payer: Medicare Other

## 2020-04-24 ENCOUNTER — Other Ambulatory Visit: Payer: Self-pay

## 2020-04-24 DIAGNOSIS — Z0000 Encounter for general adult medical examination without abnormal findings: Secondary | ICD-10-CM

## 2020-04-24 NOTE — Progress Notes (Signed)
PCP notes:  Health Maintenance: No gaps noted   Abnormal Screenings: none   Patient concerns: none   Nurse concerns: none   Next PCP appt.: 05/08/2020 @ 11:20 am

## 2020-04-24 NOTE — Patient Instructions (Signed)
Dominic Kane , Thank you for taking time to come for your Medicare Wellness Visit. I appreciate your ongoing commitment to your health goals. Please review the following plan we discussed and let me know if I can assist you in the future.   Screening recommendations/referrals: Colonoscopy: no longer required  Recommended yearly ophthalmology/optometry visit for glaucoma screening and checkup Recommended yearly dental visit for hygiene and checkup  Vaccinations: Influenza vaccine: Up to date, completed 12/07/2019, due 10/2020 Pneumococcal vaccine: Completed series Tdap vaccine: Up to date, completed 11/17/2014, due 10/2024 Shingles vaccine: due, check with your insurance regarding coverage if interested    Covid-19: Completed series  Advanced directives: Please bring a copy of your POA (Power of [place]) and/or Living Will to your next appointment.   Conditions/risks identified: diabetes, hyperlipidemia   Next appointment: Follow up in one year for your annual wellness visit.   Preventive Care 36 Years and Older, Male Preventive care refers to lifestyle choices and visits with your health care provider that can promote health and wellness. What does preventive care include?  A yearly physical exam. This is also called an annual well check.  Dental exams once or twice a year.  Routine eye exams. Ask your health care provider how often you should have your eyes checked.  Personal lifestyle choices, including:  Daily care of your teeth and gums.  Regular physical activity.  Eating a healthy diet.  Avoiding tobacco and drug use.  Limiting alcohol use.  Practicing safe sex.  Taking low doses of aspirin every day.  Taking vitamin and mineral supplements as recommended by your health care provider. What happens during an annual well check? The services and screenings done by your health care provider during your annual well check will depend on your age, overall health, lifestyle  risk factors, and family history of disease. Counseling  Your health care provider may ask you questions about your:  Alcohol use.  Tobacco use.  Drug use.  Emotional well-being.  Home and relationship well-being.  Sexual activity.  Eating habits.  History of falls.  Memory and ability to understand (cognition).  Work and work Statistician. Screening  You may have the following tests or measurements:  Height, weight, and BMI.  Blood pressure.  Lipid and cholesterol levels. These may be checked every 5 years, or more frequently if you are over 84 years old.  Skin check.  Lung cancer screening. You may have this screening every year starting at age 71 if you have a 30-pack-year history of smoking and currently smoke or have quit within the past 15 years.  Fecal occult blood test (FOBT) of the stool. You may have this test every year starting at age 48.  Flexible sigmoidoscopy or colonoscopy. You may have a sigmoidoscopy every 5 years or a colonoscopy every 10 years starting at age 69.  Prostate cancer screening. Recommendations will vary depending on your family history and other risks.  Hepatitis C blood test.  Hepatitis B blood test.  Sexually transmitted disease (STD) testing.  Diabetes screening. This is done by checking your blood sugar (glucose) after you have not eaten for a while (fasting). You may have this done every 1-3 years.  Abdominal aortic aneurysm (AAA) screening. You may need this if you are a current or former smoker.  Osteoporosis. You may be screened starting at age 64 if you are at high risk. Talk with your health care provider about your test results, treatment options, and if necessary, the need for more  tests. Vaccines  Your health care provider may recommend certain vaccines, such as:  Influenza vaccine. This is recommended every year.  Tetanus, diphtheria, and acellular pertussis (Tdap, Td) vaccine. You may need a Td booster every 10  years.  Zoster vaccine. You may need this after age 66.  Pneumococcal 13-valent conjugate (PCV13) vaccine. One dose is recommended after age 62.  Pneumococcal polysaccharide (PPSV23) vaccine. One dose is recommended after age 22. Talk to your health care provider about which screenings and vaccines you need and how often you need them. This information is not intended to replace advice given to you by your health care provider. Make sure you discuss any questions you have with your health care provider. Document Released: 04/06/2015 Document Revised: 11/28/2015 Document Reviewed: 01/09/2015 Elsevier Interactive Patient Education  2017 [place] Prevention in the Home Falls can cause injuries. They can happen to people of all ages. There are many things you can do to make your home safe and to help prevent falls. What can I do on the outside of my home?  Regularly fix the edges of walkways and driveways and fix any cracks.  Remove anything that might make you trip as you walk through a door, such as a raised step or threshold.  Trim any bushes or trees on the path to your home.  Use bright outdoor lighting.  Clear any walking paths of anything that might make someone trip, such as rocks or tools.  Regularly check to see if handrails are loose or broken. Make sure that both sides of any steps have handrails.  Any raised decks and porches should have guardrails on the edges.  Have any leaves, snow, or ice cleared regularly.  Use sand or salt on walking paths during winter.  Clean up any spills in your garage right away. This includes oil or grease spills. What can I do in the bathroom?  Use night lights.  Install grab bars by the toilet and in the tub and shower. Do not use towel bars as grab bars.  Use non-skid mats or decals in the tub or shower.  If you need to sit down in the shower, use a plastic, non-slip stool.  Keep the floor dry. Clean up any water that  spills on the floor as soon as it happens.  Remove soap buildup in the tub or shower regularly.  Attach bath mats securely with double-sided non-slip rug tape.  Do not have throw rugs and other things on the floor that can make you trip. What can I do in the bedroom?  Use night lights.  Make sure that you have a light by your bed that is easy to reach.  Do not use any sheets or blankets that are too big for your bed. They should not hang down onto the floor.  Have a firm chair that has side arms. You can use this for support while you get dressed.  Do not have throw rugs and other things on the floor that can make you trip. What can I do in the kitchen?  Clean up any spills right away.  Avoid walking on wet floors.  Keep items that you use a lot in easy-to-reach places.  If you need to reach something above you, use a strong step stool that has a grab bar.  Keep electrical cords out of the way.  Do not use floor polish or wax that makes floors slippery. If you must use wax, use non-skid floor  wax.  Do not have throw rugs and other things on the floor that can make you trip. What can I do with my stairs?  Do not leave any items on the stairs.  Make sure that there are handrails on both sides of the stairs and use them. Fix handrails that are broken or loose. Make sure that handrails are as long as the stairways.  Check any carpeting to make sure that it is firmly attached to the stairs. Fix any carpet that is loose or worn.  Avoid having throw rugs at the top or bottom of the stairs. If you do have throw rugs, attach them to the floor with carpet tape.  Make sure that you have a light switch at the top of the stairs and the bottom of the stairs. If you do not have them, ask someone to add them for you. What else can I do to help prevent falls?  Wear shoes that:  Do not have high heels.  Have rubber bottoms.  Are comfortable and fit you well.  Are closed at the  toe. Do not wear sandals.  If you use a stepladder:  Make sure that it is fully opened. Do not climb a closed stepladder.  Make sure that both sides of the stepladder are locked into place.  Ask someone to hold it for you, if possible.  Clearly mark and make sure that you can see:  Any grab bars or handrails.  First and last steps.  Where the edge of each step is.  Use tools that help you move around (mobility aids) if they are needed. These include:  Canes.  Walkers.  Scooters.  Crutches.  Turn on the lights when you go into a dark area. Replace any light bulbs as soon as they burn out.  Set up your furniture so you have a clear path. Avoid moving your furniture around.  If any of your floors are uneven, fix them.  If there are any pets around you, be aware of where they are.  Review your medicines with your doctor. Some medicines can make you feel dizzy. This can increase your chance of falling. Ask your doctor what other things that you can do to help prevent falls. This information is not intended to replace advice given to you by your health care provider. Make sure you discuss any questions you have with your health care provider. Document Released: 01/04/2009 Document Revised: 08/16/2015 Document Reviewed: 04/14/2014 Elsevier Interactive Patient Education  2017 [employer].

## 2020-04-24 NOTE — Progress Notes (Addendum)
Subjective:   Dominic Kane is a 80 y.o. male who presents for Medicare Annual/Subsequent preventive examination.  Review of Systems: N/A      I connected with the patient today by telephone and verified that I am speaking with the correct person using two identifiers. Location patient: home Location nurse: work Persons participating in the telephone visit: patient, nurse.   I discussed the limitations, risks, security and privacy concerns of performing an evaluation and management service by telephone and the availability of in person appointments. I also discussed with the patient that there may be a patient responsible charge related to this service. The patient expressed understanding and verbally consented to this telephonic visit.        Cardiac Risk Factors include: advanced age (>9mn, >>21women);male gender;diabetes mellitus;Other (see comment), Risk factor comments: hyperlipidemia     Objective:    Today's Vitals   There is no height or weight on file to calculate BMI.  Advanced Directives 04/24/2020 02/16/2019 02/09/2018 01/05/2017 08/05/2016 07/30/2016 03/18/2016  Does Patient Have a Medical Advance Directive? Yes Yes Yes Yes - Yes Yes  Type of Advance Directive [place]Living will [place]Living will [place]Living will HM[place]Living will [place]Port MansfieldLiving will HFort Polk NorthLiving will [place]Living will  Does patient want to make changes to medical advance directive? - - - - - - No - Patient declined  Copy of [place]orter [place]in Chart? No - copy requested No - copy requested No - copy requested Yes Yes - No - copy requested    Current Medications (verified) Outpatient Encounter Medications as of 04/24/2020  Medication Sig  . aspirin EC 81 MG tablet Take 81 mg by mouth daily.  . Blood Glucose Monitoring Suppl (ONE  TOUCH ULTRA 2) w/Device KIT Use to check blood sugar daily.  . cetirizine (ZYRTEC) 10 MG tablet Take 10 mg by mouth daily.  . Cholecalciferol (D3 ADULT PO) Take by mouth.  . finasteride (PROSCAR) 5 MG tablet TAKE 1 TABLET DAILY  . furosemide (LASIX) 40 MG tablet Take 1 tablet (40 mg total) by mouth daily.  .Marland Kitchenglucose blood (ONE TOUCH ULTRA TEST) test strip Use to check blood sugar every other day  E11.9  . levothyroxine (SYNTHROID) 150 MCG tablet Take 1 tablet (150 mcg total) by mouth daily before breakfast.  . Melatonin 5 MG TABS Take 5 mg by mouth at bedtime.  .Marland Kitchenomeprazole (PRILOSEC) 40 MG capsule TAKE 1 CAPSULE DAILY  . OneTouch Delica Lancets 346KMISC Use to check blood sugar two times a day. Dx: E11.9  . PRESCRIPTION MEDICATION every 4 (four) months. *Antibiotic Injection in Eye*  . ramipril (ALTACE) 2.5 MG capsule Take 1 capsule (2.5 mg total) by mouth daily.  . rosuvastatin (CRESTOR) 10 MG tablet Take 1 tablet (10 mg total) by mouth daily.  .Marland Kitchenterazosin (HYTRIN) 10 MG capsule TAKE 1 CAPSULE BY MOUTH EVERY DAY   No facility-administered encounter medications on file as of 04/24/2020.    Allergies (verified) Codeine and Sulfonamide derivatives   History: Past Medical History:  Diagnosis Date  . Abnormality of gait   . Acute sinusitis, unspecified   . Allergy   . 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 Aortic valve disorders   . Arthritis    Osteoarthritis-bilateral knees, lower back issues occasionaly related to knee issues  . Benign paroxysmal positional vertigo  not in a long time  . Cataract    resolved  . CHF (congestive heart failure) ([place])   . Coronary atherosclerosis of native coronary artery   . Diabetes mellitus without complication ([place])    Diet control only.  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 Diverticulosis of colon (without mention of hemorrhage)   . Dysfunction of eustachian tube    Hard of hearing"bilateral  hearing aids"  . Enlarged prostate   . GERD (gastroesophageal reflux disease)   . Headache(784.0)   . History of kidney stones    past hx. 15 yrs ago x1  . Hypertrophy of prostate without urinary obstruction and other lower urinary tract symptoms (LUTS)   . Lipoprotein deficiencies   . Other and unspecified hyperlipidemia   . Other primary cardiomyopathies   . Pain in joint, lower leg   . Personal history of colonic polyps   . Personal history of other diseases of digestive system   . Pes anserinus tendinitis or bursitis    left shoulder remains an issue  . Sinoatrial node dysfunction (HCC)    [name] follows  . 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 Thoracic or lumbosacral neuritis or radiculitis, unspecified   . Unspecified disorder of skin and subcutaneous tissue   . Unspecified essential hypertension   . Unspecified vitamin D deficiency    Past Surgical History:  Procedure Laterality Date  . BASAL CELL CARCINOMA EXCISION  11/2016   Dr. Nevada Crane  . CATARACT EXTRACTION, BILATERAL    . COLONOSCOPY W/ POLYPECTOMY    . DOBUTAMINE STRESS ECHO  1/07   Lateral hypokinesis but no ischemia  . ESOPHAGOGASTRODUODENOSCOPY  11/07   Schatzki's ring, non bleeding erosive gastropathy [place]  . KNEE SURGERY Left 1947  . LYMPH NODE DISSECTION N/A 08/05/2016   Procedure: LIMITED LYMPH NODE DISSECTION;  Surgeon: Armandina Gemma, MD;  Location: [facility];  Service: General;  Laterality: N/A;  . Pulmonary functioning tests  1. 2003  2. 2005   1. Diminished lung capacity  2. Stable  . THYROIDECTOMY N/A 08/05/2016   Procedure: TOTAL THYROIDECTOMY;  Surgeon: Armandina Gemma, MD;  Location: [facility];  Service: General;  Laterality: N/A;  . TONSILLECTOMY    . TOTAL KNEE ARTHROPLASTY Left 03/18/2016   Procedure: LEFT TOTAL KNEE ARTHROPLASTY;  Surgeon: Paralee Cancel, MD;  Location: [facility];  Service: Orthopedics;  Laterality: Left;   Family History  Problem Relation Age of Onset  . Heart  failure Mother   . Coronary artery disease Mother   . Stroke Mother   . Hypothyroidism Mother   . Cancer Father        LUNG  . Cancer Sister        BREAST  . Heart failure Maternal Grandmother   . Hypothyroidism Daughter   . Colon cancer Neg Hx    Social History   Socioeconomic History  . Marital status: Married    Spouse name: Not on file  . Number of children: 1  . Years of education: Not on file  . Highest education level: Not on file  Occupational History  . 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 Smoking status: Former Smoker    Quit date: 07/03/1980    Years since quitting: 39.8  . Smokeless tobacco: Never Used  Vaping Use  . Vaping Use: Never used  Substance and Sexual Activity  . Alcohol use: Yes  Alcohol/week: 0.0 standard drinks    Comment: occassionally  . Drug use: No  . Sexual activity: Not on file  Other Topics Concern  . 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 Difficulty of Paying Living Expenses: Not hard at all  Food Insecurity: No Food Insecurity  . Worried About Charity fundraiser in the Last Year: Never true  . Ran Out of Food in the Last Year: Never true  Transportation Needs: No Transportation Needs  . Lack of Transportation (Medical): No  . Lack of Transportation (Non-Medical): No  Physical Activity: Inactive  . Days of Exercise per Week: 0 days  . Minutes of Exercise per Session: 0 min  Stress: No Stress Concern Present  . Feeling of Stress : Not at all  Social Connections: Not on file    Tobacco Counseling Counseling given: Not Answered   Clinical Intake:  Pre-visit preparation completed: Yes  Pain : No/denies pain     Nutritional Risks:  None Diabetes: Yes CBG done?: No Did pt. 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  No  Does the patient have any non-healing wounds?  No  Has the patient had any unintentional weight loss or weight gain?  No    Diabetes:  Is the patient diabetic?  Yes  If diabetic, was a CBG obtained today?  No  telephone visit  Did the patient bring in their glucometer from home?  No telephone visit  How often do you monitor your CBG's? Every other day.   Financial Strains and Diabetes Management:  Are you having any financial strains with the device, your supplies or your medication? No .  Does the patient want to be seen by Chronic Care Management for management of their diabetes?  No  Would the patient like to be referred to a Nutritionist or for Diabetic Management?  No   Diabetic Exams:  Diabetic Eye Exam: Completed 01/04/2020 Diabetic Foot Exam: Completed 08/23/2019   Interpreter Needed?: No  Information entered by :: CJohnson, LPN   Activities of Daily Living In your present state of health, do you have any difficulty performing the following activities: 04/24/2020  Hearing? Y  Comment wears hearing aids  Vision? N  Difficulty concentrating or making decisions? N  Walking or climbing stairs? N  Dressing or bathing? N  Doing errands, shopping? N  Preparing Food and eating ? N  Using the Toilet? N  In the past six months, have you accidently leaked urine? N  Do you have problems with loss of bowel control? N  Managing your Medications? N  Managing your Finances? N  Housekeeping or managing your Housekeeping? N  Some recent data might be hidden     Patient Care Team: Jinny Sanders, MD as PCP - General Burnell Blanks, MD as PCP - Cardiology (Cardiology)  Indicate any recent Medical Services you may have received from other than [facility] providers in the past year (date may be approximate).     Assessment:   This is a routine wellness examination for Dominic Kane.  Hearing/Vision screen  Hearing Screening   125Hz  250Hz  500Hz  1000Hz  2000Hz  3000Hz  4000Hz  6000Hz  8000Hz   Right ear:           Left ear:           Vision Screening Comments: Patient gets annual eye exams   Dietary issues and exercise activities discussed: Current Exercise Habits: The patient does not participate in regular exercise at present, Exercise limited by: None identified  Goals    . Health management     Starting 02/09/2018, I will continue to walk 5-6 hours daily, drink 9-10 glasses of water daily, and to take medications as prescribed.     . Patient Stated     02/16/2019, I will try to keep glucose in normal range and lose some weight.     . Patient Stated     04/24/2020, I will continue to work part time and walk for 5-8 hours at work.       Depression Screen PHQ 2/9 Scores 04/24/2020 03/30/2020 02/16/2019 02/09/2018 01/05/2017 10/19/2015 10/23/2014  PHQ - 2 Score 0 0 0 0 0 0 1  PHQ- 9 Score 0 - 0 0 0 - -    Fall Risk Fall Risk  04/24/2020 03/30/2020 02/16/2019 03/16/2018 02/10/2018  Falls in the past year? 0 0 1 - 1  Comment - - moving backwards and fell - Emmi Telephone Survey: data to providers prior to load  Number falls in past yr: 0 0 1 - 1  Comment - - - - Emmi Telephone Survey Actual Response = 1  Injury with Fall? 0 0 0 - 1  Risk for fall due to : Medication side effect - Medication side effect History of fall(s);Impaired balance/gait -  Follow up Falls evaluation completed;Falls prevention discussed Falls evaluation completed Falls evaluation completed;Falls prevention discussed Falls evaluation completed;Education provided;Falls prevention  discussed -    FALL RISK PREVENTION PERTAINING TO THE HOME:  Any stairs in or around the home? Yes  If so, are there any without handrails? No  Home free of loose throw rugs in walkways, pet beds, electrical cords, etc? Yes  Adequate lighting in your home to reduce risk of falls? Yes   ASSISTIVE DEVICES UTILIZED TO PREVENT FALLS:  Life alert? No  Use of a cane, walker or w/c? No  Grab bars in the bathroom? No  Shower chair or bench in shower? No  Elevated toilet seat or a handicapped toilet? No   TIMED UP AND GO:  Was the test performed? N/A telephone visit .   Cognitive Function: MMSE - Mini Mental State Exam 04/24/2020 02/16/2019 02/09/2018 01/05/2017  Not completed: Refused - - -  Orientation to time - 5 5 5   Orientation to Place - 5 5 5   Registration - 3 3 3   Attention/ Calculation - 3 0 0  Recall - 3 3 3   Language- name 2 objects - - 0 0  Language- repeat - 1 1 1   Language- follow 3 step command - - 3 3  Language- read & follow direction - - 0 0  Write a sentence - - 0 0  Copy design - - 0 0  Total score - - 20 20  Mini Cog  Mini-Cog screen was not completed. Patient states he has no  memory issues. Maximum score is 22. A value of 0 denotes this part of the MMSE was not completed or the patient failed this part of the Mini-Cog screening.       Immunizations Immunization History  Administered Date(s) Administered  . Fluad Quad(high Dose 65+) 11/17/2018  . Influenza Whole 12/23/2006, 12/09/2007, 12/22/2008  . Influenza, High Dose Seasonal PF 11/17/2014, 12/12/2015, 12/18/2016, 12/22/2017, 12/07/2019  . Influenza,inj,Quad PF,6+ Mos 01/06/2013  . Influenza-Unspecified 11/22/2013  . Moderna Sars-Covid-2 Vaccination 04/06/2019, 05/04/2019, 02/20/2020  . Pneumococcal Conjugate-13 10/12/2014  . Pneumococcal Polysaccharide-23 12/23/2006  . Td 03/24/2001  . Tdap 11/17/2014  . Zoster 06/23/2014    TDAP status: Up to date  Flu Vaccine status: Up to  date  Pneumococcal vaccine status: Up to date  Covid-19 vaccine status: Completed vaccines  Qualifies for Shingles Vaccine? Yes   Zostavax completed Yes   Shingrix Completed?: No.    Education has been provided regarding the importance of this vaccine. Patient has been advised to call insurance company to determine out of pocket expense if they have not yet received this vaccine. Advised may also receive vaccine at local pharmacy or Health Dept. Verbalized acceptance and understanding.  Screening Tests Health Maintenance  Topic Date Due  . Hepatitis C Screening  Never done  . HEMOGLOBIN A1C  07/04/2020  . COVID-19 Vaccine (4 - Booster for Moderna series) 08/19/2020  . FOOT EXAM  08/22/2020  . OPHTHALMOLOGY EXAM  01/03/2021  . TETANUS/TDAP  11/16/2024  . INFLUENZA VACCINE  Completed  . PNA vac Low Risk Adult  Completed    Health Maintenance  Health Maintenance Due  Topic Date Due  . Hepatitis C Screening  Never done    Colorectal cancer screening: No longer required.   Lung Cancer Screening: (Low Dose CT Chest recommended if Age 46-80 years, 30 pack-year currently smoking OR have quit w/in 15years.) does not qualify.  Additional Screening:  Hepatitis C Screening: does qualify; Completed due  Vision Screening: Recommended annual ophthalmology exams for early detection of glaucoma and other disorders of the eye. Is the patient up to date with their annual eye exam?  Yes  Who is the provider or what is the name of the office in which the patient attends annual eye exams? Dr. Zigmund Daniel  If pt is not established with a provider, would they like to be referred to a provider to establish care? No .   Dental Screening: Recommended annual dental exams for proper oral hygiene  Community Resource Referral / Chronic Care Management: CRR required this visit?  No   CCM required this visit?  No      Plan:     I have personally reviewed and noted the following in the patient's  chart:   . Medical and social history . Use of alcohol, tobacco or illicit drugs  . Current medications and supplements . Functional ability and status . Nutritional status . Physical activity . Advanced directives . List of other physicians . Hospitalizations, surgeries, and ER visits in previous 12 months . Vitals . Screenings to include cognitive, depression, and falls . Referrals and appointments  In addition, I have reviewed and discussed with patient certain preventive protocols, quality metrics, and best practice recommendations. A written personalized care plan for preventive services as well as general preventive health recommendations were provided to patient.   Due to this being a telephonic visit, the after visit summary with patients personalized plan was offered to patient via office or my-chart. Patient preferred to  pick up at office at next visit or via mychart.   Andrez Grime, LPN   09/29/6416

## 2020-04-27 DIAGNOSIS — H905 Unspecified sensorineural hearing loss: Secondary | ICD-10-CM | POA: Diagnosis not present

## 2020-05-02 ENCOUNTER — Other Ambulatory Visit (INDEPENDENT_AMBULATORY_CARE_PROVIDER_SITE_OTHER): Payer: Medicare Other

## 2020-05-02 ENCOUNTER — Other Ambulatory Visit: Payer: Self-pay

## 2020-05-02 DIAGNOSIS — E11319 Type 2 diabetes mellitus with unspecified diabetic retinopathy without macular edema: Secondary | ICD-10-CM

## 2020-05-02 DIAGNOSIS — E890 Postprocedural hypothyroidism: Secondary | ICD-10-CM | POA: Diagnosis not present

## 2020-05-02 DIAGNOSIS — E785 Hyperlipidemia, unspecified: Secondary | ICD-10-CM

## 2020-05-02 DIAGNOSIS — E1169 Type 2 diabetes mellitus with other specified complication: Secondary | ICD-10-CM | POA: Diagnosis not present

## 2020-05-02 DIAGNOSIS — E559 Vitamin D deficiency, unspecified: Secondary | ICD-10-CM | POA: Diagnosis not present

## 2020-05-02 LAB — COMPREHENSIVE METABOLIC PANEL
ALT: 11 U/L (ref 0–53)
AST: 13 U/L (ref 0–37)
Albumin: 4.1 g/dL (ref 3.5–5.2)
Alkaline Phosphatase: 71 U/L (ref 39–117)
BUN: 29 mg/dL — ABNORMAL HIGH (ref 6–23)
CO2: 28 mEq/L (ref 19–32)
Calcium: 9.4 mg/dL (ref 8.4–10.5)
Chloride: 102 mEq/L (ref 96–112)
Creatinine, Ser: 1.13 mg/dL (ref 0.40–1.50)
GFR: 61.9 mL/min (ref >60.00)
Glucose, Bld: 111 mg/dL — ABNORMAL HIGH (ref 70–99)
Potassium: 4.5 mEq/L (ref 3.5–5.1)
Sodium: 139 mEq/L (ref 135–145)
Total Bilirubin: 1.1 mg/dL (ref 0.2–1.2)
Total Protein: 6.8 g/dL (ref 6.0–8.3)

## 2020-05-02 LAB — LIPID PANEL
Cholesterol: 127 mg/dL (ref 0–200)
HDL: 49.2 mg/dL (ref >39.00)
LDL Cholesterol: 67 mg/dL (ref 0–99)
NonHDL: 77.74
Total CHOL/HDL Ratio: 3
Triglycerides: 56 mg/dL (ref 0.0–149.0)
VLDL: 11.2 mg/dL (ref 0.0–40.0)

## 2020-05-02 LAB — TSH: TSH: 4.04 u[IU]/mL (ref 0.35–4.50)

## 2020-05-02 LAB — T3, FREE: T3, Free: 2.7 pg/mL (ref 2.3–4.2)

## 2020-05-02 LAB — T4, FREE: Free T4: 1.43 ng/dL (ref 0.60–1.60)

## 2020-05-02 LAB — HEMOGLOBIN A1C: Hgb A1c MFr Bld: 6.6 % — ABNORMAL HIGH (ref 4.6–6.5)

## 2020-05-02 LAB — VITAMIN D 25 HYDROXY (VIT D DEFICIENCY, FRACTURES): VITD: 70.69 ng/mL (ref 30.00–100.00)

## 2020-05-02 NOTE — Progress Notes (Signed)
No critical labs need to be addressed urgently. We will discuss labs in detail at upcoming office visit.   

## 2020-05-08 ENCOUNTER — Other Ambulatory Visit: Payer: Self-pay

## 2020-05-08 ENCOUNTER — Encounter: Payer: Self-pay | Admitting: Family Medicine

## 2020-05-08 ENCOUNTER — Ambulatory Visit (INDEPENDENT_AMBULATORY_CARE_PROVIDER_SITE_OTHER): Payer: Medicare Other | Admitting: Family Medicine

## 2020-05-08 VITALS — BP 118/58 | HR 76 | Temp 98.2°F | Ht 69.69 in | Wt 204.8 lb

## 2020-05-08 DIAGNOSIS — E890 Postprocedural hypothyroidism: Secondary | ICD-10-CM | POA: Diagnosis not present

## 2020-05-08 DIAGNOSIS — I509 Heart failure, unspecified: Secondary | ICD-10-CM

## 2020-05-08 DIAGNOSIS — I10 Essential (primary) hypertension: Secondary | ICD-10-CM | POA: Diagnosis not present

## 2020-05-08 DIAGNOSIS — Z0000 Encounter for general adult medical examination without abnormal findings: Secondary | ICD-10-CM

## 2020-05-08 DIAGNOSIS — I429 Cardiomyopathy, unspecified: Secondary | ICD-10-CM

## 2020-05-08 DIAGNOSIS — E1169 Type 2 diabetes mellitus with other specified complication: Secondary | ICD-10-CM

## 2020-05-08 DIAGNOSIS — E11319 Type 2 diabetes mellitus with unspecified diabetic retinopathy without macular edema: Secondary | ICD-10-CM | POA: Diagnosis not present

## 2020-05-08 DIAGNOSIS — C73 Malignant neoplasm of thyroid gland: Secondary | ICD-10-CM | POA: Diagnosis not present

## 2020-05-08 DIAGNOSIS — E1340 Other specified diabetes mellitus with diabetic neuropathy, unspecified: Secondary | ICD-10-CM | POA: Diagnosis not present

## 2020-05-08 DIAGNOSIS — E785 Hyperlipidemia, unspecified: Secondary | ICD-10-CM | POA: Diagnosis not present

## 2020-05-08 DIAGNOSIS — E559 Vitamin D deficiency, unspecified: Secondary | ICD-10-CM | POA: Diagnosis not present

## 2020-05-08 NOTE — Assessment & Plan Note (Signed)
Euvolemic, lasix prn.  Due for yearly follow up with cardiology.

## 2020-05-08 NOTE — Assessment & Plan Note (Signed)
Followed by ENDO 

## 2020-05-08 NOTE — Assessment & Plan Note (Signed)
Diet controlled.  

## 2020-05-08 NOTE — Assessment & Plan Note (Signed)
Stable, chronic.  Continue current medication.    

## 2020-05-08 NOTE — Assessment & Plan Note (Signed)
Diabetic shoes indicated.

## 2020-05-08 NOTE — Progress Notes (Signed)
Patient ID: Dominic Kane, male    DOB: Nov 25, 1940, 80 y.o.   MRN: [id number]  This visit was conducted in person.  BP (!) 118/58   Pulse 76   Temp 98.2 F (36.8 C) (Temporal)   Ht 5' 9.69" (1.77 m)   Wt 204 lb 12 oz (92.9 kg)   SpO2 96%   BMI 29.64 kg/m    CC:  Chief Complaint  Patient presents with  . Annual Exam    Part 2    Subjective:   HPI: Dominic Kane is a 80 y.o. male presenting on 05/08/2020 for Annual Exam (Part 2)  and review of chronic health problems. He/She also has the following acute concerns today: none  The patient saw a LPN or RN for medicare wellness visit.  Prevention and wellness was reviewed in detail. Note reviewed and important notes copied below.  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 using voltaren gel.  Diabetes: Diet controlled Lab Results  Component Value Date   HGBA1C 6.6 (H) 05/02/2020  Using medications without difficulties: Hypoglycemic episodes:none Hyperglycemic episodes: none Feet problems: no ulcers Blood Sugars averaging: FBS 110s eye exam within last year: scheduled Retinopathy  followed by Dr. Zigmund Daniel  Neuropathy, stable.. followed by Podiatrist.  Hypertension: Low normal on  Lasix  and  Low dose ramipril. BP Readings from Last 3 Encounters:  05/08/20 (!) 118/58  04/19/20 110/60  03/30/20 (!) 144/78  Using medication without problems or lightheadedness:  none Chest pain with exertion:none Edema:none Short of breath:none Average home BPs: Other issues:  Elevated Cholesterol:  LDL at goal < 70 on Crestor 10 mg daily Lab Results  Component Value Date   CHOL 127 05/02/2020   HDL 49.20 05/02/2020   LDLCALC 67 05/02/2020   TRIG 56.0 05/02/2020   CHOLHDL 3 05/02/2020  Using medications without problems: Muscle aches:  Diet compliance: good Exercise: back at work Other complaints:  CAD, CHF, cardiomyopathy  followed by Dr. Glennie Hawk.. Due for follow up.   Hypothyroid  And  Papillary thyroid care followed by Dr. Loanne Drilling ENDO. Lab Results  Component Value Date   TSH 4.04 05/02/2020   Patient Care Team: Jinny Sanders, MD as PCP - General Burnell Blanks, MD as PCP - Cardiology (Cardiology)      Relevant past medical, surgical, family and social history reviewed and updated as indicated. Interim medical history since our last visit reviewed. Allergies and medications reviewed and updated. Outpatient Medications Prior to Visit  Medication Sig Dispense Refill  . aspirin EC 81 MG tablet Take 81 mg by mouth daily.    . Blood Glucose Monitoring Suppl (ONE TOUCH ULTRA 2) w/Device KIT Use to check blood sugar daily. 1 each 0  . cetirizine (ZYRTEC) 10 MG tablet Take 10 mg by mouth daily.    . Cholecalciferol (D3 ADULT PO) Take by mouth.    . finasteride (PROSCAR) 5 MG tablet TAKE 1 TABLET DAILY 90 tablet 1  . furosemide (LASIX) 40 MG tablet Take 1 tablet (40 mg total) by mouth daily. 90 tablet 3  . glucose blood (ONE TOUCH ULTRA TEST) test strip Use to check blood sugar every other day  E11.9 200 each 3  . levothyroxine (SYNTHROID) 150 MCG tablet Take 1 tablet (150 mcg total) by mouth daily before breakfast. 90 tablet 3  . Melatonin 5 MG TABS Take 5 mg by mouth at bedtime.    [name]  omeprazole (PRILOSEC) 40 MG capsule TAKE 1 CAPSULE DAILY 90 capsule 3  . OneTouch Delica Lancets 63S MISC Use to check blood sugar two times a day. Dx: E11.9 200 each 3  . PRESCRIPTION MEDICATION every 4 (four) months. *Antibiotic Injection in Eye*    . ramipril (ALTACE) 2.5 MG capsule Take 1 capsule (2.5 mg total) by mouth daily. 90 capsule 3  . rosuvastatin (CRESTOR) 10 MG tablet Take 1 tablet (10 mg total) by mouth daily. 90 tablet 3  . terazosin (HYTRIN) 10 MG capsule TAKE 1 CAPSULE BY MOUTH EVERY DAY 90 capsule 2   No facility-administered medications prior to visit.     Per HPI unless specifically indicated  in ROS section below Review of Systems  Constitutional: Negative for fatigue and fever.       Balance issues   HENT: Negative for ear pain.   Eyes: Negative for pain.  Respiratory: Negative for cough and shortness of breath.   Cardiovascular: Negative for chest pain, palpitations and leg swelling.  Gastrointestinal: Negative for abdominal pain.  Genitourinary: Negative for dysuria.  Musculoskeletal: Negative for arthralgias.  Neurological: Negative for syncope, light-headedness and headaches.  Psychiatric/Behavioral: Negative for dysphoric mood.   Objective:  BP (!) 118/58   Pulse 76   Temp 98.2 F (36.8 C) (Temporal)   Ht 5' 9.69" (1.77 m)   Wt 204 lb 12 oz (92.9 kg)   SpO2 96%   BMI 29.64 kg/m   Wt Readings from Last 3 Encounters:  05/08/20 204 lb 12 oz (92.9 kg)  04/19/20 206 lb 8 oz (93.7 kg)  03/30/20 214 lb (97.1 kg)      Physical Exam Constitutional:      General: He is not in acute distress.    Appearance: Normal appearance. He is well-developed and well-nourished. He is not ill-appearing or toxic-appearing.  HENT:     Head: Normocephalic and atraumatic.     Right Ear: Hearing, tympanic membrane, ear canal and external ear normal.     Left Ear: Hearing, tympanic membrane, ear canal and external ear normal.     Nose: Nose normal.     Mouth/Throat:     Mouth: Oropharynx is clear and moist and mucous membranes are normal.     Pharynx: Uvula midline.  Eyes:     General: Lids are normal. Lids are everted, no foreign bodies appreciated.     Extraocular Movements: EOM normal.     Conjunctiva/sclera: Conjunctivae normal.     Pupils: Pupils are equal, round, and reactive to light.  Neck:     Thyroid: No thyroid mass or thyromegaly.     Vascular: No carotid bruit.     Trachea: Trachea and phonation normal.  Cardiovascular:     Rate and Rhythm: Normal rate and regular rhythm.     Pulses: Normal pulses and intact distal pulses.     Heart sounds: S1 normal and S2  normal. Murmur heard.   Systolic murmur is present with a grade of 3/6. No gallop.   Pulmonary:     Breath sounds: Normal breath sounds. No wheezing, rhonchi or rales.  Abdominal:     General: Bowel sounds are normal.     Palpations: Abdomen is soft. There is no hepatosplenomegaly.     Tenderness: There is no abdominal tenderness. There is no CVA tenderness, guarding or rebound.     Hernia: No hernia is present.  Musculoskeletal:     Cervical back: Normal range of motion and neck supple.  Right lower leg: No edema.     Left lower leg: No edema.  Lymphadenopathy:     Cervical: No cervical adenopathy.  Skin:    General: Skin is warm, dry and intact.     Findings: No rash.  Neurological:     Mental Status: He is alert.     Cranial Nerves: No cranial nerve deficit.     Sensory: No sensory deficit.     Gait: Gait normal.     Deep Tendon Reflexes: Strength normal and reflexes are normal and symmetric.  Psychiatric:        Mood and Affect: Mood and affect normal.        Speech: Speech normal.        Behavior: Behavior normal.        Judgment: Judgment normal.     Diabetic foot exam: Abnormal inspection.. hammer toes No skin breakdown  pre-ulcertivecalluses  Normal DP pulses decreasedsensation to light touch and monofilament Nails thickened Diabetic shows indicated.    Results for orders placed or performed in visit on 05/02/20  T3, free  Result Value Ref Range   T3, Free 2.7 2.3 - 4.2 pg/mL  T4, free  Result Value Ref Range   Free T4 1.43 0.60 - 1.60 ng/dL  TSH  Result Value Ref Range   TSH 4.04 0.35 - 4.50 uIU/mL  VITAMIN D 25 Hydroxy (Vit-D Deficiency, Fractures)  Result Value Ref Range   VITD 70.69 30.00 - 100.00 ng/mL  Comprehensive metabolic panel  Result Value Ref Range   Sodium 139 135 - 145 mEq/L   Potassium 4.5 3.5 - 5.1 mEq/L   Chloride 102 96 - 112 mEq/L   CO2 28 19 - 32 mEq/L   Glucose, Bld 111 (H) 70 - 99 mg/dL   BUN 29 (H) 6 - 23 mg/dL    Creatinine, Ser 1.13 0.40 - 1.50 mg/dL   Total Bilirubin 1.1 0.2 - 1.2 mg/dL   Alkaline Phosphatase 71 39 - 117 U/L   AST 13 0 - 37 U/L   ALT 11 0 - 53 U/L   Total Protein 6.8 6.0 - 8.3 g/dL   Albumin 4.1 3.5 - 5.2 g/dL   GFR 61.90 >60.00 mL/min   Calcium 9.4 8.4 - 10.5 mg/dL  Lipid panel  Result Value Ref Range   Cholesterol 127 0 - 200 mg/dL   Triglycerides 56.0 0.0 - 149.0 mg/dL   HDL 49.20 >39.00 mg/dL   VLDL 11.2 0.0 - 40.0 mg/dL   LDL Cholesterol 67 0 - 99 mg/dL   Total CHOL/HDL Ratio 3    NonHDL 77.74   Hemoglobin A1c  Result Value Ref Range   Hgb A1c MFr Bld 6.6 (H) 4.6 - 6.5 %    This visit occurred during the SARS-CoV-2 public health emergency.  Safety protocols were in place, including screening questions prior to the visit, additional usage of staff PPE, and extensive cleaning of exam room while observing appropriate contact time as indicated for disinfecting solutions.   COVID 19 screen:  No recent travel or known exposure to COVID19 The patient denies respiratory symptoms of COVID 19 at this time. The importance of social distancing was discussed today.   Assessment and Plan The patient's preventative maintenance and recommended screening tests for an annual wellness exam were reviewed in full today. Brought up to date unless services declined.  Counselled on the importance of diet, exercise, and its role in overall health and mortality. The patient's FH and SH was reviewed,  including their home life, tobacco status, and drug and alcohol status.   Vaccines:Uptodate, COVID x 3,  Colon:2016 3 adenomas, Dr. Carlean Purl, no followed up indicated Former smoker 20 pipe, quit 40 years ago. Prostate eval not indicated.   Rxs given for Shingrix vaccine and diabetic shoes.  Problem List Items Addressed This Visit    Cardiomyopathy ([place])   CHF (NYHA class II, ACC/AHA stage C) (HCC)     Euvolemic, lasix prn.  Due for yearly follow up with cardiology.      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      Essential hypertension, benign   Hyperlipidemia associated with type 2 diabetes mellitus (HCC)    Stable, chronic.  Continue current medication.   Crestor 10 mg daily.      Hypothyroidism    Stable, chronic.  Continue current medication.         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      Papillary thyroid carcinoma ([place])     Followed by ENDO.      Vitamin D deficiency    Other Visit Diagnoses    Routine general medical examination at a health care facility    -  Primary      Eliezer Lofts, MD

## 2020-05-08 NOTE — Patient Instructions (Signed)
Call to make yearly follow up with Cardiology.

## 2020-05-08 NOTE — Assessment & Plan Note (Signed)
Stable, chronic.  Continue current medication.   Crestor 10 mg daily 

## 2020-05-08 NOTE — Assessment & Plan Note (Signed)
Followed by opthalmology.       

## 2020-05-28 DIAGNOSIS — H52223 Regular astigmatism, bilateral: Secondary | ICD-10-CM | POA: Diagnosis not present

## 2020-05-28 DIAGNOSIS — Z9841 Cataract extraction status, right eye: Secondary | ICD-10-CM | POA: Diagnosis not present

## 2020-05-28 DIAGNOSIS — Z9842 Cataract extraction status, left eye: Secondary | ICD-10-CM | POA: Diagnosis not present

## 2020-05-28 LAB — HM DIABETES EYE EXAM

## 2020-06-04 ENCOUNTER — Other Ambulatory Visit: Payer: Self-pay | Admitting: Primary Care

## 2020-06-05 NOTE — Telephone Encounter (Signed)
To PCP

## 2020-06-11 ENCOUNTER — Other Ambulatory Visit: Payer: Self-pay | Admitting: *Deleted

## 2020-06-11 MED ORDER — OMEPRAZOLE 40 MG PO CPDR: 40.0000 mg | DELAYED_RELEASE_CAPSULE | Freq: Every day | ORAL | 3 refills | Status: DC

## 2020-06-11 MED ORDER — FUROSEMIDE 40 MG PO TABS: 40.0000 mg | ORAL_TABLET | Freq: Every day | ORAL | 0 refills | Status: DC

## 2020-06-11 MED ORDER — FINASTERIDE 5 MG PO TABS: 5.0000 mg | ORAL_TABLET | Freq: Every day | ORAL | 3 refills | Status: DC

## 2020-06-11 MED ORDER — TERAZOSIN HCL 10 MG PO CAPS: ORAL_CAPSULE | ORAL | 3 refills | Status: DC

## 2020-06-13 ENCOUNTER — Other Ambulatory Visit: Payer: Self-pay | Admitting: *Deleted

## 2020-06-13 MED ORDER — RAMIPRIL 2.5 MG PO CAPS: 2.5000 mg | ORAL_CAPSULE | Freq: Every day | ORAL | 3 refills | Status: DC

## 2020-06-14 ENCOUNTER — Encounter: Payer: Self-pay | Admitting: Family Medicine

## 2020-06-14 ENCOUNTER — Other Ambulatory Visit: Payer: Self-pay | Admitting: Endocrinology

## 2020-06-14 DIAGNOSIS — E042 Nontoxic multinodular goiter: Secondary | ICD-10-CM

## 2020-07-02 ENCOUNTER — Ambulatory Visit: Payer: Medicare Other | Admitting: Endocrinology

## 2020-07-02 ENCOUNTER — Other Ambulatory Visit: Payer: Self-pay

## 2020-07-02 VITALS — BP 134/76 | HR 73 | Ht 69.0 in | Wt 212.2 lb

## 2020-07-02 DIAGNOSIS — E11319 Type 2 diabetes mellitus with unspecified diabetic retinopathy without macular edema: Secondary | ICD-10-CM | POA: Diagnosis not present

## 2020-07-02 DIAGNOSIS — C73 Malignant neoplasm of thyroid gland: Secondary | ICD-10-CM

## 2020-07-02 DIAGNOSIS — E890 Postprocedural hypothyroidism: Secondary | ICD-10-CM | POA: Diagnosis not present

## 2020-07-02 DIAGNOSIS — R200 Anesthesia of skin: Secondary | ICD-10-CM | POA: Insufficient documentation

## 2020-07-02 LAB — POCT GLYCOSYLATED HEMOGLOBIN (HGB A1C): Hemoglobin A1C: 6.1 % — AB (ref 4.0–5.6)

## 2020-07-02 LAB — T4, FREE: Free T4: 1.27 ng/dL (ref 0.60–1.60)

## 2020-07-02 LAB — VITAMIN B12: Vitamin B-12: 480 pg/mL (ref 211–911)

## 2020-07-02 LAB — TSH: TSH: 3.36 u[IU]/mL (ref 0.35–4.50)

## 2020-07-02 NOTE — Progress Notes (Signed)
Subjective:    Patient ID: Dominic Kane, male    DOB: 11-Feb-1941, 80 y.o.   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   Other: he has never taken insulin.   Interval history: he was rx'ed prednisone last month, but he did not take.  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   4/18: thyroidectomy:  pT3b, pN0 6/18: RAI, on synthroid, 122 mCi, stimulated by thyrogen.  6/18: post-therapy scan pos at thyroid bed only.  10/18: TG is undetectable (ab pos).  4/19: TG is undetectable (ab pos, but lower titer).   5/20: TG= 0.3 (ab neg).   10/21 TG= 0.3 (ab neg) He does not notice any neck nodule or pain.  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  Past Medical History:  Diagnosis Date  . Abnormality of gait   . Acute sinusitis, unspecified   . Allergy   . 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 Aortic valve disorders   . Arthritis    Osteoarthritis-bilateral knees, lower back issues occasionaly related to knee issues  . Benign paroxysmal positional vertigo    not in a long time  . Cataract    resolved  . CHF (congestive heart failure) ([place])   . Coronary atherosclerosis of native coronary artery   . Diabetes mellitus without complication ([place])    Diet control only.  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 Diverticulosis of colon (without mention of hemorrhage)   . Dysfunction of eustachian tube    Hard of hearing"bilateral hearing aids"  . Enlarged prostate   . GERD (gastroesophageal reflux disease)   . Headache(784.0)   . History of kidney stones    past hx. 15 yrs ago x1  .  Hypertrophy of prostate without urinary obstruction and other lower urinary tract symptoms (LUTS)   . Lipoprotein deficiencies   . Other and unspecified hyperlipidemia   . Other primary cardiomyopathies   . Pain in joint, lower leg   . Personal history of colonic polyps   . Personal history of other diseases of digestive system   . Pes anserinus tendinitis or bursitis    left shoulder remains an issue  . Sinoatrial node dysfunction (HCC)    [name] follows  . 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 Thoracic or lumbosacral neuritis or radiculitis, unspecified   . Unspecified disorder of skin and subcutaneous tissue   . Unspecified essential hypertension   . Unspecified vitamin D deficiency     Past Surgical History:  Procedure Laterality Date  . BASAL CELL CARCINOMA EXCISION  11/2016   Dr. [place] Crane  . CATARACT EXTRACTION, BILATERAL    . COLONOSCOPY W/ POLYPECTOMY    . DOBUTAMINE STRESS ECHO  1/07   Lateral hypokinesis but no ischemia  . ESOPHAGOGASTRODUODENOSCOPY  11/07   Schatzki's ring, non bleeding erosive gastropathy [place]  . KNEE SURGERY Left 1947  . LYMPH NODE DISSECTION N/A 08/05/2016   Procedure: LIMITED LYMPH NODE DISSECTION;  Surgeon: Armandina Gemma, MD;  Location: [facility];  Service: General;  Laterality: N/A;  . Pulmonary functioning tests  1. 2003  2. 2005   1. Diminished lung capacity  2. Stable  . THYROIDECTOMY N/A 08/05/2016   Procedure: TOTAL THYROIDECTOMY;  Surgeon: Armandina Gemma, MD;  Location: [facility];  Service: General;  Laterality: N/A;  . TONSILLECTOMY    . TOTAL KNEE ARTHROPLASTY Left 03/18/2016   Procedure: LEFT TOTAL KNEE ARTHROPLASTY;  Surgeon: Paralee Cancel, MD;  Location: [facility];  Service: Orthopedics;  Laterality: Left;    Social History   Socioeconomic History  . Marital status: Married    Spouse name: Not on file  . Number of children: 1  . Years of education: Not on file  . Highest education level: Not on file  Occupational  History  . 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 Smoking status: Former Smoker    Quit date: 07/03/1980    Years since quitting: 40.0  . Smokeless tobacco: Never Used  Vaping Use  . Vaping Use: Never used  Substance and Sexual Activity  . Alcohol use: Yes    Alcohol/week: 0.0 standard drinks    Comment: occassionally  . Drug use: No  . Sexual activity: Not on file  Other Topics Concern  . 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 Difficulty of Paying Living Expenses: Not hard at all  Food Insecurity: No Food Insecurity  . Worried About Charity fundraiser in the Last Year: Never true  . Ran Out of Food in the Last Year: Never true  Transportation Needs: No Transportation Needs  . Lack of Transportation (Medical): No  . Lack of Transportation (Non-Medical): No  Physical Activity: Inactive  . Days of Exercise per Week: 0 days  . Minutes of Exercise per Session: 0 min  Stress: No Stress Concern Present  . Feeling of Stress : Not at all  Social Connections: Not on file  Intimate Partner Violence: Not At Risk  . Fear of Current or Ex-Partner: No  . Emotionally Abused: No  . Physically Abused: No  . Sexually Abused: No    Current Outpatient Medications on File Prior to Visit  Medication Sig Dispense Refill  . aspirin EC 81 MG tablet Take 81 mg by mouth daily.    . Blood Glucose Monitoring Suppl (ONE TOUCH ULTRA 2) w/Device KIT Use to check blood sugar daily. 1 each 0  . cetirizine (ZYRTEC) 10 MG tablet Take 10 mg by mouth daily.    . Cholecalciferol (D3 ADULT PO) Take by mouth.    . finasteride (PROSCAR) 5 MG tablet Take 1 tablet (5 mg total) by mouth daily. 90 tablet 3  . furosemide (LASIX)  40 MG tablet Take 1 tablet (40 mg total) by mouth daily. Pt is due for an appt in April and can get yearly supply at that visit.  Please have pt call to schedule 90 tablet 0  . glucose blood (ONE TOUCH ULTRA TEST) test strip Use to check blood sugar every other day  E11.9 200 each 3  . levothyroxine (SYNTHROID) 150 MCG tablet TAKE 1 TABLET BY MOUTH DAILY BEFORE BREAKFAST. 90 tablet 3  . Melatonin 5 MG TABS Take 5 mg by mouth at bedtime.    [name] omeprazole (PRILOSEC) 40 MG capsule Take 1 capsule (40  mg total) by mouth daily. 90 capsule 3  . OneTouch Delica Lancets 15Z MISC Use to check blood sugar two times a day. Dx: E11.9 200 each 3  . PRESCRIPTION MEDICATION every 4 (four) months. *Antibiotic Injection in Eye*    . ramipril (ALTACE) 2.5 MG capsule Take 1 capsule (2.5 mg total) by mouth daily. 90 capsule 3  . rosuvastatin (CRESTOR) 10 MG tablet Take 1 tablet (10 mg total) by mouth daily. 90 tablet 3  . terazosin (HYTRIN) 10 MG capsule TAKE 1 CAPSULE BY MOUTH EVERY DAY 90 capsule 3   No current facility-administered medications on file prior to visit.    Allergies  Allergen Reactions  . Codeine Nausea Only  . Sulfonamide Derivatives Hives    Family History  Problem Relation Age of Onset  . Heart failure Mother   . Coronary artery disease Mother   . Stroke Mother   . Hypothyroidism Mother   . Cancer Father        LUNG  . Cancer Sister        BREAST  . Heart failure Maternal Grandmother   . Hypothyroidism Daughter   . Colon cancer Neg Hx     BP 134/76 (BP Location: Right Arm, Patient Position: Sitting, Cuff Size: Large)   Pulse 73   Ht 5' 9"  (1.753 m)   Wt 212 lb 3.2 oz (96.3 kg)   SpO2 96%   BMI 31.34 kg/m    Review of Systems He has slight numbness of the feet.      Objective:   Physical Exam VITAL SIGNS:  See vs page GENERAL: no distress Neck: a healed scar is present.  I do not appreciate a nodule in the thyroid or elsewhere in the neck.      Lab Results   Component Value Date   HGBA1C 6.1 (A) 07/02/2020   Lab Results  Component Value Date   TSH 3.36 07/02/2020      Assessment & Plan:  PTC: due for recheck DM, due to chronic pancreatitis.  Stable.  We'll follow Hypothyroidism: well-controlled.  Please continue the same synthroid.  Patient Instructions  Blood tests are requested for you today.  We'll let you know about the results.    If the cancer test is worse, let's check the ultrasound.   Please come back for a follow-up appointment in 6 months.

## 2020-07-02 NOTE — Patient Instructions (Addendum)
Blood tests are requested for you today.  We'll let you know about the results.    If the cancer test is worse, let's check the ultrasound.   Please come back for a follow-up appointment in 6 months.

## 2020-07-03 LAB — THYROGLOBULIN ANTIBODY: Thyroglobulin Ab: <1 IU/mL (ref <=1)

## 2020-07-03 LAB — THYROGLOBULIN LEVEL: Thyroglobulin: 0.3 ng/mL — ABNORMAL LOW

## 2020-07-11 ENCOUNTER — Telehealth: Payer: Self-pay | Admitting: Family Medicine

## 2020-07-11 NOTE — Chronic Care Management (AMB) (Signed)
  Chronic Care Management   Outreach Note  07/11/2020 Name: Dominic Kane MRN: [id number] DOB: February 15, 1941  Referred by: Jinny Sanders, MD Reason for referral : No chief complaint on file.   An unsuccessful telephone outreach was attempted today. The patient was referred to the pharmacist for assistance with care management and care coordination.   Follow Up Plan:   Lauretta Grill Upstream Scheduler

## 2020-07-11 NOTE — Chronic Care Management (AMB) (Signed)
  Chronic Care Management   Note  07/11/2020 Name: Dominic Kane MRN: [id number] DOB: 08-May-1940  Dominic Kane is a 80 y.o. year old male who is a primary care patient of Bedsole, Amy E, MD. I reached out to Adrienne Mocha by phone today in response to a referral sent by Dominic Kane's PCP, Jinny Sanders, MD.   [name] was given information about Chronic Care Management services today including:  1. CCM service includes personalized support from designated clinical staff supervised by his physician, including individualized plan of care and coordination with other care providers 2. 24/7 contact phone numbers for assistance for urgent and routine care needs. 3. Service will only be billed when office clinical staff spend 20 minutes or more in a month to coordinate care. 4. Only one practitioner may furnish and bill the service in a calendar month. 5. The patient may stop CCM services at any time (effective at the end of the month) by phone call to the office staff.   Patient agreed to services and verbal consent obtained.   Follow up plan:   Lauretta Grill Upstream Scheduler

## 2020-07-14 ENCOUNTER — Other Ambulatory Visit: Payer: Self-pay | Admitting: Cardiovascular Disease

## 2020-07-17 ENCOUNTER — Other Ambulatory Visit: Payer: Self-pay

## 2020-07-17 ENCOUNTER — Other Ambulatory Visit: Payer: Medicare Other | Admitting: *Deleted

## 2020-07-17 DIAGNOSIS — I2510 Atherosclerotic heart disease of native coronary artery without angina pectoris: Secondary | ICD-10-CM

## 2020-07-17 DIAGNOSIS — I5022 Chronic systolic (congestive) heart failure: Secondary | ICD-10-CM | POA: Diagnosis not present

## 2020-07-17 DIAGNOSIS — E7800 Pure hypercholesterolemia, unspecified: Secondary | ICD-10-CM | POA: Diagnosis not present

## 2020-07-17 DIAGNOSIS — I712 Thoracic aortic aneurysm, without rupture, unspecified: Secondary | ICD-10-CM

## 2020-07-17 DIAGNOSIS — I428 Other cardiomyopathies: Secondary | ICD-10-CM

## 2020-07-17 LAB — BASIC METABOLIC PANEL
BUN/Creatinine Ratio: 23 (ref 10–24)
BUN: 24 mg/dL (ref 8–27)
CO2: 23 mmol/L (ref 20–29)
Calcium: 9.4 mg/dL (ref 8.6–10.2)
Chloride: 103 mmol/L (ref 96–106)
Creatinine, Ser: 1.06 mg/dL (ref 0.76–1.27)
Glucose: 119 mg/dL — ABNORMAL HIGH (ref 65–99)
Potassium: 3.9 mmol/L (ref 3.5–5.2)
Sodium: 140 mmol/L (ref 134–144)
eGFR: 71 mL/min/{1.73_m2} (ref >59)

## 2020-07-24 ENCOUNTER — Ambulatory Visit
Admission: RE | Admit: 2020-07-24 | Discharge: 2020-07-24 | Disposition: A | Discharge status: Home / Self Care | Payer: Medicare Other | Source: Ambulatory Visit | Attending: Cardiovascular Disease | Admitting: Cardiovascular Disease

## 2020-07-24 ENCOUNTER — Other Ambulatory Visit: Payer: Self-pay

## 2020-07-24 DIAGNOSIS — I2510 Atherosclerotic heart disease of native coronary artery without angina pectoris: Secondary | ICD-10-CM | POA: Diagnosis not present

## 2020-07-24 DIAGNOSIS — R911 Solitary pulmonary nodule: Secondary | ICD-10-CM | POA: Diagnosis not present

## 2020-07-24 DIAGNOSIS — I712 Thoracic aortic aneurysm, without rupture, unspecified: Secondary | ICD-10-CM

## 2020-07-24 DIAGNOSIS — I700 Atherosclerosis of aorta: Secondary | ICD-10-CM | POA: Diagnosis not present

## 2020-07-24 MED ORDER — IOPAMIDOL (ISOVUE-370) INJECTION 76%
75.0000 mL | Freq: Once | INTRAVENOUS | Status: AC | PRN
  Administered 2020-07-24: 75 mL via INTRAVENOUS

## 2020-07-25 ENCOUNTER — Telehealth: Payer: Self-pay | Admitting: Cardiovascular Disease

## 2020-07-25 NOTE — Telephone Encounter (Signed)
Spoke with patient about results of CT, stable.  Patient asked about nodule in lungs and educated patient on repeat CT in 6-12 months.  Patient appreciative of call.

## 2020-07-25 NOTE — Telephone Encounter (Signed)
PT is returning call for results form recent visit 07/24/20. Please advise

## 2020-07-30 ENCOUNTER — Encounter (INDEPENDENT_AMBULATORY_CARE_PROVIDER_SITE_OTHER): Payer: Medicare Other | Admitting: Ophthalmology

## 2020-07-30 ENCOUNTER — Other Ambulatory Visit: Payer: Self-pay

## 2020-07-30 ENCOUNTER — Encounter: Payer: Self-pay | Admitting: Family Medicine

## 2020-07-30 DIAGNOSIS — H43813 Vitreous degeneration, bilateral: Secondary | ICD-10-CM | POA: Diagnosis not present

## 2020-07-30 DIAGNOSIS — I10 Essential (primary) hypertension: Secondary | ICD-10-CM | POA: Diagnosis not present

## 2020-07-30 DIAGNOSIS — H353122 Nonexudative age-related macular degeneration, left eye, intermediate dry stage: Secondary | ICD-10-CM | POA: Diagnosis not present

## 2020-07-30 DIAGNOSIS — H35371 Puckering of macula, right eye: Secondary | ICD-10-CM | POA: Diagnosis not present

## 2020-07-30 DIAGNOSIS — H35033 Hypertensive retinopathy, bilateral: Secondary | ICD-10-CM

## 2020-07-30 DIAGNOSIS — H34811 Central retinal vein occlusion, right eye, with macular edema: Secondary | ICD-10-CM | POA: Diagnosis not present

## 2020-07-30 DIAGNOSIS — H33301 Unspecified retinal break, right eye: Secondary | ICD-10-CM

## 2020-07-30 LAB — HM DIABETES EYE EXAM

## 2020-08-22 ENCOUNTER — Telehealth: Payer: Self-pay | Admitting: Family Medicine

## 2020-08-22 ENCOUNTER — Telehealth: Payer: Self-pay

## 2020-08-22 ENCOUNTER — Other Ambulatory Visit: Payer: Self-pay | Admitting: Cardiovascular Disease

## 2020-08-22 NOTE — Chronic Care Management (AMB) (Addendum)
Chronic Care Management Pharmacy Assistant   Name: Dominic Kane  MRN: [id number] DOB: [birth year]  Reason for Encounter: Initial Questions and Chart Review for 08/28/20 Appointment   Conditions to be addressed/monitored: CHF, CAD, HTN, HLD and DMII  Recent office visits:  05/08/20- PCP- Annual Exam. No changes.  04/19/20- Dr. Lorelei Pont- Trochanteric bursitis. Received cortisone injection in office. No medication changes.  03/30/20- Dr. Danise Mina- Left hip pain. Recommended OTC voltaren gel, Tylenol 650 mg TID and hip bursitis exercises.  Recent consult visits:  07/02/20- Endocrinology- A1c, thyroglobulin, free T4, TSH and vitamin B 12 labs ordered.  Hospital visits:  None in previous 6 months  Medications: Outpatient Encounter Medications as of 08/22/2020  Medication Sig   aspirin EC 81 MG tablet Take 81 mg by mouth daily.   Blood Glucose Monitoring Suppl (ONE TOUCH ULTRA 2) w/Device KIT Use to check blood sugar daily.   cetirizine (ZYRTEC) 10 MG tablet Take 10 mg by mouth daily.   Cholecalciferol (D3 ADULT PO) Take by mouth.   finasteride (PROSCAR) 5 MG tablet Take 1 tablet (5 mg total) by mouth daily.   furosemide (LASIX) 40 MG tablet Take 1 tablet (40 mg total) by mouth daily. Pt is due for an appt in April and can get yearly supply at that visit.  Please have pt call to schedule   glucose blood (ONE TOUCH ULTRA TEST) test strip Use to check blood sugar every other day  E11.9   levothyroxine (SYNTHROID) 150 MCG tablet TAKE 1 TABLET BY MOUTH DAILY BEFORE BREAKFAST.   Melatonin 5 MG TABS Take 5 mg by mouth at bedtime.   omeprazole (PRILOSEC) 40 MG capsule Take 1 capsule (40 mg total) by mouth daily.   OneTouch Delica Lancets 20N MISC Use to check blood sugar two times a day. Dx: E11.9   PRESCRIPTION MEDICATION every 4 (four) months. *Antibiotic Injection in Eye*   ramipril (ALTACE) 2.5 MG capsule Take 1 capsule (2.5 mg total) by mouth daily.   rosuvastatin (CRESTOR) 10 MG tablet  TAKE 1 TABLET BY MOUTH EVERY DAY   terazosin (HYTRIN) 10 MG capsule TAKE 1 CAPSULE BY MOUTH EVERY DAY   No facility-administered encounter medications on file as of 08/22/2020.     Lab Results  Component Value Date/Time   HGBA1C 6.1 (A) 07/02/2020 08:35 AM   HGBA1C 6.6 (H) 05/02/2020 08:56 AM   HGBA1C 6.7 (H) 01/04/2020 09:39 AM     BP Readings from Last 3 Encounters:  07/02/20 134/76  05/08/20 (!) 118/58  04/19/20 110/60    Have you seen any other providers since your last visit with PCP? Yes - Dr. Loanne Drilling, endocrinology.   Any changes in your medications or health? No  Any side effects from any medications? No  Do you have an symptoms or problems not managed by your medications? No  Any concerns about your health right now? Yes- back pain. Sciatica   Has your provider asked that you check blood pressure, blood sugar, or follow special diet at home? Yes- blood sugar  Do you get any type of exercise on a regular basis? Yes- still works  Can you think of a goal you would like to reach for your health? Yes - "wants to stay alive"  Do you have any problems getting your medications? No  Is there anything that you would like to discuss during the appointment? No   Nikolaos Maddocks Goines was reminded to have all medications, supplements and any blood glucose and blood  pressure readings available for review with Debbora Dus, Pharm. D, at his telephone visit on 08/28/20 at 2:00 PM.   Message received that wife wanted to change patient's appointment to earlier in the day. Sharyn Lull did not have anything available 08/28/20. Patient was offered appointment 08/26/20 but he stated his wife has another appointment that day and he asked to just keep the appointment scheduled where it was.    Star Rating Drugs:  Medication:  Last Fill: Day Supply Ramipril 2.5 mg 05/27/20  90 Rosuvastatin 10 mg 07/16/20 90   Follow-Up:  Pharmacist Review  Debbora Dus, CPP notified  Margaretmary Dys,  [place] Pharmacy Assistant [phone]  I have reviewed the care management and care coordination activities outlined in this encounter and I am certifying that I agree with the content of this note. No further action required.  Debbora Dus, PharmD Clinical Pharmacist [facility] at [facility] [phone]

## 2020-08-22 NOTE — Telephone Encounter (Signed)
Mrs. saccente called in wanted to know if she can change his apt time to about 30 mins earlier due to she has an appointment for an ekg that day

## 2020-08-27 NOTE — Telephone Encounter (Signed)
Appointment changed to afternoon.

## 2020-08-28 ENCOUNTER — Telehealth: Payer: Medicare Other

## 2020-08-31 ENCOUNTER — Other Ambulatory Visit: Payer: Self-pay

## 2020-08-31 MED ORDER — ROSUVASTATIN CALCIUM 10 MG PO TABS: 10.0000 mg | ORAL_TABLET | Freq: Every day | ORAL | 1 refills | Status: DC

## 2020-09-03 ENCOUNTER — Telehealth: Payer: Self-pay | Admitting: Cardiovascular Disease

## 2020-09-03 NOTE — Telephone Encounter (Signed)
Dominic Kane is calling stating a nodule was found on one of his lungs based on the results of the recent test he had per [name]. He is wanting to know what [name] recommends he do in regards to it. Please advise.

## 2020-09-03 NOTE — Telephone Encounter (Signed)
#  2 of IMPRESSION of CT aorta 07/24/2020:  2. Small LEFT lower lobe pulmonary nodule. Recommend follow-up CT in 6-12 months.   Pt due for follow up of aorta 07/2021.  Due for annual visit with [name] 12/2020.   Will route to [name] to determine if there should be f/u of 3 mm LLL nodule.

## 2020-09-05 ENCOUNTER — Other Ambulatory Visit: Payer: Self-pay

## 2020-09-05 ENCOUNTER — Ambulatory Visit (INDEPENDENT_AMBULATORY_CARE_PROVIDER_SITE_OTHER): Payer: Medicare Other

## 2020-09-05 DIAGNOSIS — E1169 Type 2 diabetes mellitus with other specified complication: Secondary | ICD-10-CM

## 2020-09-05 DIAGNOSIS — E11319 Type 2 diabetes mellitus with unspecified diabetic retinopathy without macular edema: Secondary | ICD-10-CM | POA: Diagnosis not present

## 2020-09-05 DIAGNOSIS — I10 Essential (primary) hypertension: Secondary | ICD-10-CM | POA: Diagnosis not present

## 2020-09-05 DIAGNOSIS — E785 Hyperlipidemia, unspecified: Secondary | ICD-10-CM | POA: Diagnosis not present

## 2020-09-05 NOTE — Telephone Encounter (Signed)
Follow up of the nodule in May 2023. Gerald Stabs

## 2020-09-05 NOTE — Progress Notes (Signed)
Chronic Care Management Pharmacy Note  09/05/2020 Name:  Dominic Kane MRN:  [id number] DOB:  09-02-1940  Subjective: Dominic Kane is an 80 y.o. year old male who is a primary patient of Bedsole, Amy E, MD.  The CCM team was consulted for assistance with disease management and care coordination needs.    Engaged with patient by telephone for initial visit in response to provider referral for pharmacy case management and/or care coordination services. Denies health concerns.  Consent to Services:  The patient was given the following information about Chronic Care Management services today, agreed to services, and gave verbal consent: 1. CCM service includes personalized support from designated clinical staff supervised by the primary care provider, including individualized plan of care and coordination with other care providers 2. 24/7 contact phone numbers for assistance for urgent and routine care needs. 3. Service will only be billed when office clinical staff spend 20 minutes or more in a month to coordinate care. 4. Only one practitioner may furnish and bill the service in a calendar month. 5.The patient may stop CCM services at any time (effective at the end of the month) by phone call to the office staff. 6. The patient will be responsible for cost sharing (co-pay) of up to 20% of the service fee (after annual deductible is met). Patient agreed to services and consent obtained.  Patient Care Team: Jinny Sanders, MD as PCP - General Burnell Blanks, MD as PCP - Cardiology (Cardiology) Debbora Dus, [facility] as Pharmacist (Pharmacist)  Recent office visits:  05/08/20 - Dr. Diona Browner, PCP - Annual Exam. No medication changes. Due for annual Cardiology exam for CHF. Call to schedule visit. 04/19/20 - Dr. Lorelei Pont - Trochanteric bursitis. Received cortisone injection in office. No medication changes. 03/30/20 - Dr. Danise Mina - Left hip pain. Recommended OTC voltaren gel, Tylenol 650  mg TID and hip bursitis exercises.   Recent consult visits:  07/02/20 - Endocrinology - DM and hypothyroidism follow up, ordered A1c, thyroglobulin, free T4, TSH and vitamin B12 labs ordered. Continue current medications.    Hospital visits:  None in previous 6 months  Objective:  Lab Results  Component Value Date   CREATININE 1.06 07/17/2020   BUN 24 07/17/2020   GFR 61.90 05/02/2020   GFRNONAA 70 07/20/2019   GFRAA 81 07/20/2019   NA 140 07/17/2020   K 3.9 07/17/2020   CALCIUM 9.4 07/17/2020   CO2 23 07/17/2020   GLUCOSE 119 (H) 07/17/2020    Lab Results  Component Value Date/Time   HGBA1C 6.1 (A) 07/02/2020 08:35 AM   HGBA1C 6.6 (H) 05/02/2020 08:56 AM   HGBA1C 6.7 (H) 01/04/2020 09:39 AM   GFR 61.90 05/02/2020 08:56 AM   GFR 76.65 02/16/2019 08:56 AM    Last diabetic Eye exam:  Lab Results  Component Value Date/Time   HMDIABEYEEXA No Retinopathy 07/30/2020 12:00 AM    Last diabetic Foot exam:  Lab Results  Component Value Date/Time   HMDIABFOOTEX done 08/23/2019 12:00 AM     Lab Results  Component Value Date   CHOL 127 05/02/2020   HDL 49.20 05/02/2020   LDLCALC 67 05/02/2020   TRIG 56.0 05/02/2020   CHOLHDL 3 05/02/2020    Hepatic Function Latest Ref Rng & Units 05/02/2020 02/16/2019 02/09/2018  Total Protein 6.0 - 8.3 g/dL 6.8 7.2 6.9  Albumin 3.5 - 5.2 g/dL 4.1 4.0 4.1  AST 0 - 37 U/L 13 21 14   ALT 0 - 53 U/L 11 14  12  Alk Phosphatase 39 - 117 U/L 71 79 76  Total Bilirubin 0.2 - 1.2 mg/dL 1.1 1.1 1.1  Bilirubin, Direct 0.0 - 0.3 mg/dL - - -    Lab Results  Component Value Date/Time   TSH 3.36 07/02/2020 08:44 AM   TSH 4.04 05/02/2020 08:56 AM   FREET4 1.27 07/02/2020 08:44 AM   FREET4 1.43 05/02/2020 08:56 AM    CBC Latest Ref Rng & Units 07/30/2016 03/19/2016 03/11/2016  WBC 4.0 - 10.5 K/uL 9.7 17.1(H) 7.5  Hemoglobin 13.0 - 17.0 g/dL 13.0 10.4(L) 12.0(L)  Hematocrit 39.0 - 52.0 % 39.4 30.9(L) 36.1(L)  Platelets 150 - 400 K/uL 200 156 172     Lab Results  Component Value Date/Time   VD25OH 70.69 05/02/2020 08:56 AM   VD25OH 45.32 08/15/2019 09:02 AM    Clinical ASCVD: Yes  The ASCVD Risk score Mikey Bussing DC Jr., et al., 2013) failed to calculate for the following reasons:   The valid total cholesterol range is 130 to 320 mg/dL    Depression screen [facility] 2/9 04/24/2020 03/30/2020 02/16/2019  Decreased Interest 0 0 0  Down, Depressed, Hopeless 0 0 0  PHQ - 2 Score 0 0 0  Altered sleeping 0 - 0  Tired, decreased energy 0 - 0  Change in appetite 0 - 0  Feeling bad or failure about yourself  0 - 0  Trouble concentrating 0 - 0  Moving slowly or fidgety/restless 0 - 0  Suicidal thoughts 0 - 0  PHQ-9 Score 0 - 0  Difficult doing work/chores Not difficult at all - Not difficult at all  Some recent data might be hidden    Social History   Tobacco Use  Smoking Status Former   Pack years: 0.00   Types: Cigarettes   Quit date: 07/03/1980   Years since quitting: 40.2  Smokeless Tobacco Never   BP Readings from Last 3 Encounters:  07/02/20 134/76  05/08/20 (!) 118/58  04/19/20 110/60   Pulse Readings from Last 3 Encounters:  07/02/20 73  05/08/20 76  04/19/20 66   Wt Readings from Last 3 Encounters:  07/02/20 212 lb 3.2 oz (96.3 kg)  05/08/20 204 lb 12 oz (92.9 kg)  04/19/20 206 lb 8 oz (93.7 kg)   BMI Readings from Last 3 Encounters:  07/02/20 31.34 kg/m  05/08/20 29.64 kg/m  04/19/20 28.01 kg/m   Assessment/Interventions: Review of patient past medical history, allergies, medications, health status, including review of consultants reports, laboratory and other test data, was performed as part of comprehensive evaluation and provision of chronic care management services.   SDOH:  (Social Determinants of Health) assessments and interventions performed: Yes SDOH Interventions    Flowsheet Row Most Recent Value  SDOH Interventions   Financial Strain Interventions Intervention Not Indicated      SDOH  Screenings   Alcohol Screen: Low Risk    Last Alcohol Screening Score (AUDIT): 1  Depression (PHQ2-9): Low Risk    PHQ-2 Score: 0  Financial Resource Strain: Low Risk    Difficulty of Paying Living Expenses: Not very hard  Food Insecurity: No Food Insecurity   Worried About Charity fundraiser in the Last Year: Never true   Ran Out of Food in the Last Year: Never true  Housing: Low Risk    Last Housing Risk Score: 0  Physical Activity: Inactive   Days of Exercise per Week: 0 days   Minutes of Exercise per Session: 0 min  Social Connections: Not  on file  Stress: No Stress Concern Present   Feeling of Stress : Not at all  Tobacco Use: Medium Risk   Smoking Tobacco Use: Former   Smokeless Tobacco Use: Never  Transportation Needs: No Transportation Needs   Lack of Transportation (Medical): No   Lack of Transportation (Non-Medical): No    CCM Care Plan  Allergies  Allergen Reactions   Codeine Nausea Only   Sulfonamide Derivatives Hives    Medications Reviewed Today     Reviewed by Debbora Dus, [facility] (Pharmacist) on 09/05/20 at 1555  Med List Status: <None>   Medication Order Taking? Sig Documenting Provider Last Dose Status Informant  aspirin EC 81 MG tablet [id number] Yes Take 81 mg by mouth daily. [provider] Taking Active Self  Blood Glucose Monitoring Suppl (ONE TOUCH ULTRA 2) w/Device KIT [id number] Yes Use to check blood sugar daily. Jinny Sanders, MD Taking Active   cetirizine (ZYRTEC) 10 MG tablet [id number] Yes Take 10 mg by mouth daily. [provider] Taking Active Self  Cholecalciferol (D3 ADULT PO) [id number] Yes Take 125 mcg by mouth. Take 1 (5000 IU each) daily [provider] Taking Active Self  finasteride (PROSCAR) 5 MG tablet [id number] Yes Take 1 tablet (5 mg total) by mouth daily. Jinny Sanders, MD Taking Active   furosemide (LASIX) 40 MG tablet [id number] Yes Take 1 tablet (40 mg total) by mouth daily. Pt is due for an appt in  April and can get yearly supply at that visit.  Please have pt call to schedule Burnell Blanks, MD Taking Active   glucose blood (ONE TOUCH ULTRA TEST) test strip [id number] Yes Use to check blood sugar every other day  E11.9 Bedsole, Amy E, MD Taking Active   levothyroxine (SYNTHROID) 150 MCG tablet [id number] Yes TAKE 1 TABLET BY MOUTH DAILY BEFORE BREAKFAST. Renato Shin, MD Taking Active   Melatonin 5 MG TABS [id number] Yes Take 5 mg by mouth at bedtime. [provider] Taking Active Self  Multiple Vitamins-Minerals (PRESERVISION AREDS 2 PO) [id number] Yes Take by mouth in the morning and at bedtime. [provider] Taking Active Self  omeprazole (PRILOSEC) 40 MG capsule [id number] Yes Take 1 capsule (40 mg total) by mouth daily. Jinny Sanders, MD Taking Active   OneTouch Delica Lancets 25E MISC [id number] Yes Use to check blood sugar two times a day. Dx: E11.9 Jinny Sanders, MD Taking Active   PRESCRIPTION MEDICATION [id number] Yes every 4 (four) months. *Antibiotic Injection in Eye* [provider] Taking Active Self  ramipril (ALTACE) 2.5 MG capsule [id number] Yes TAKE 1 CAPSULE BY MOUTH EVERY DAY Burnell Blanks, MD Taking Active   rosuvastatin (CRESTOR) 10 MG tablet [id number] Yes Take 1 tablet (10 mg total) by mouth daily. 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   DM retinopathy ([place])  06/10/2016   Toenail fungus 06/10/2016  Class 1 obesity due to excess calories without serious comorbidity with body mass index (BMI) of 30.0 to 30.9 in adult 03/19/2016   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   CHF (NYHA class II, ACC/AHA stage C) ([place]) 10/12/2014   Counseling regarding end of life decision making 10/12/2014   Controlled type 2 diabetes with retinopathy ([place]) 10/12/2014   Hx of colonic polyps 05/01/2014   Osteoarthritis of left knee 11/05/2012   Hearing loss 04/20/2012   CAD, NATIVE VESSEL 03/21/2010   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   Vitamin D deficiency 05/01/2009   BACK PAIN, LUMBAR, WITH RADICULOPATHY 01/24/2008   AORTIC INSUFFICIENCY 10/08/2007   Cardiomyopathy ([place]) 10/08/2007   BENIGN POSITIONAL VERTIGO 06/24/2007   SINUS BRADYCARDIA 06/24/2007   Essential hypertension, benign 12/23/2006   BENIGN PROSTATIC HYPERTROPHY 12/23/2006   DIVERTICULITIS, HX OF 12/23/2006    Immunization History  Administered Date(s) Administered   Fluad Quad(high Dose 65+) 11/17/2018   Influenza Whole 12/23/2006, 12/09/2007, 12/22/2008   Influenza, High Dose Seasonal PF 11/17/2014, 12/12/2015, 12/18/2016, 12/22/2017, 12/07/2019   Influenza,inj,Quad PF,6+ Mos 01/06/2013   Influenza-Unspecified 11/22/2013   Moderna Sars-Covid-2 Vaccination 04/06/2019, 05/04/2019, 02/20/2020   Pneumococcal Conjugate-13 10/12/2014   Pneumococcal Polysaccharide-23 12/23/2006   Td 03/24/2001   Tdap 11/17/2014   Zoster, Live 06/23/2014    Conditions to be addressed/monitored:  Hypertension, Hyperlipidemia, Diabetes, Heart Failure, and Hypothyroidism  Care Plan : [place]  Updates made by Debbora Dus, [place] since 09/28/2020 12:00 AM     Problem: CHL AMB "PATIENT-SPECIFIC PROBLEM"      Long-Range Goal: Disease Management   Note:    Current Barriers:   None identified   Pharmacist Clinical Goal(s):   Patient will contact provider office for questions/concerns as evidenced notation  of same in electronic health record through collaboration with PharmD and provider.   Interventions:  1:1 collaboration with Jinny Sanders, MD regarding development and update of comprehensive plan of care as evidenced by provider attestation and co-signature  Inter-disciplinary care team collaboration (see longitudinal plan of care)  Comprehensive medication review performed; medication list updated in electronic medical record  Hypertension (BP goal <140/90) -Controlled - per clinic and home readings  -Current treatment:  Ramipril 2.5 mg - 1 capsule daily  -Medications previously tried: none reported  -Current home readings: usually runs pretty good - 6/11-6/15 home BP averaged 112/74, 71 (stable, consistent this week) -Denies hypotensive/hypertensive symptoms -Educated on BP goals and benefits of medications for prevention of heart attack, stroke and kidney damage; -Counseled to monitor BP at home prior to office visits and with any abnormal symptoms, document, and provide log at future appointments -Recommended to continue current medication  Hyperlipidemia: (LDL goal < 70) -Controlled - LDL 67 -Current treatment:  Rosuvastatin 10 mg - 1 tablet daily   Aspirin 81 mg - 1 tablet daily  -Medications previously tried: none reported -Educated on Cholesterol goals;  -Recommended to continue current medication  Diabetes (A1c goal <7%) -Controlled - A1c 6.1% -Current medications:  None  -Medications previously tried: none reported  -Current home glucose readings - checks every other day, fasting   fasting glucose: highest 113, usually 102-106  post prandial glucose: none -Denies hypoglycemic/hyperglycemic symptoms -Current exercise: 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 -Educated on A1c and blood sugar goals; -Counseled to check feet daily and get yearly eye exams - up to date -Recommended to continue current medication  Heart Failure (Goal: manage  symptoms and prevent exacerbations) -Controlled,  per patient report of symptom control -Followed by cardiology  -Last ejection fraction: 30-35% (Date: 07/2019) -HF type: Systolic -NYHA Class: II (slight limitation of activity) -AHA HF Stage: C (Heart disease and symptoms present) -Current treatment:  Furosemide 40 mg - 1 tablet daily -Medications previously tried: none  -Patient denies abnormal swelling -Educated on Importance of weighing daily; if you gain more than 3 pounds in one day or 5 pounds in one week, call your cardiology office -Recommended to continue current medication  Hypothyroidism (Goal: TSH normal, no symptoms) -Controlled - TSH WNL 07/02/20, followed by endocrinology -Current treatment   Levothyroxine 150 mcg - 1 tablet daily before breakfast -Medications previously tried: none -Recommended to continue current medication  GERD (Goal: Improve acid reflux symptoms) -Controlled - per patient report -Current treatment   Omeprazole 40 mg - 1 capsule daily -Medications previously tried: none reported  -Symptoms stable, pt would like to try taper. He denies ucler/bleeding history. -Recommend - Will try tapering off PPI. Try omeprazole every other day.  BPH (Goal: Improve BPH symptoms) -Controlled - per patient report -Current treatment   Terazosin 10 mg - 1 tablet daily  Finasteride 5 mg - 1 tablet daily -Medications previously tried: none reported  -Recommend continue current medications   OTC: Aspirin 81 mg daily Cetirizine 10 mg  Vitamin D3 125 mcg daily Melatonin 5 mg daily In an effort to reduce medications patient would like to trial off cetirizine. He is not having any allergy symptoms.   Patient Goals/Self-Care Activities  Patient will:  - focus on medication adherence by beginning to use a pill organizer  Follow Up Plan: Telephone follow up appointment with care management team member scheduled for:  12 months      CMA follow up - 6 months, DM  and HTN dietary and home monitoring log review   Medication Assistance: None required.  Patient affirms current coverage meets needs.  Compliance/Adherence/Medication fill history: Care Gaps: Foot exam annually Shingrix  COVID-19 Hepatitis C Screening   Star-Rating Drugs: Medication:                Last Fill:         Day Supply Ramipril 2.5 mg           08/22/20              90 Rosuvastatin 10 mg    07/16/20            90  Patient's preferred pharmacy is: [facility] [place] [facility] [place] [address] [place] [address] Phone: [phone] Fax: [phone]  [place]  [facility] [employer] [facility].   Uses pill box? No - plans to start using a monthly pillbox Pt endorses 100% compliance  We discussed: Current pharmacy is preferred with insurance plan and patient is satisfied with pharmacy services Patient decided to: Continue current medication management strategy  Care Plan and Follow Up Patient Decision:  Patient agrees to Care Plan and Follow-up.  Debbora Dus, PharmD Clinical Pharmacist [facility] at [facility] [phone]

## 2020-09-06 NOTE — Telephone Encounter (Signed)
Left detailed message that we will follow up on nodule in one year when repeat cta aorta and to call if he would like to discuss further.

## 2020-09-11 ENCOUNTER — Other Ambulatory Visit: Payer: Self-pay | Admitting: Cardiovascular Disease

## 2020-09-28 NOTE — Patient Instructions (Addendum)
Dear Dominic Kane,  Below is a summary of the goals we discussed during our follow up appointment on September 05, 2020. Please contact me anytime with questions or concerns.   Visit Information  Patient Care Plan: CCM Pharmacy Care Plan     Problem Identified: CHL AMB "PATIENT-SPECIFIC PROBLEM"      Long-Range Goal: Disease Management   Note:    Current Barriers:   None identified   Pharmacist Clinical Goal(s):   Patient will contact provider office for questions/concerns as evidenced notation of same in electronic health record through collaboration with PharmD and provider.   Interventions:  1:1 collaboration with Jinny Sanders, MD regarding development and update of comprehensive plan of care as evidenced by provider attestation and co-signature  Inter-disciplinary care team collaboration (see longitudinal plan of care)  Comprehensive medication review performed; medication list updated in electronic medical record  Hypertension (BP goal <140/90) -Controlled - per clinic and home readings  -Current treatment:  Ramipril 2.5 mg - 1 capsule daily  -Medications previously tried: none reported  -Current home readings: usually runs pretty good - 6/11-6/15 home BP averaged 112/74, 71 (stable, consistent this week) -Denies hypotensive/hypertensive symptoms -Educated on BP goals and benefits of medications for prevention of heart attack, stroke and kidney damage; -Counseled to monitor BP at home prior to office visits and with any abnormal symptoms, document, and provide log at future appointments -Recommended to continue current medication  Hyperlipidemia: (LDL goal < 70) -Controlled - LDL 67 -Current treatment:  Rosuvastatin 10 mg - 1 tablet daily   Aspirin 81 mg - 1 tablet daily  -Medications previously tried: none reported -Educated on Cholesterol goals;  -Recommended to continue current medication  Diabetes (A1c goal <7%) -Controlled - A1c 6.1% -Current  medications:  None  -Medications previously tried: none reported  -Current home glucose readings - checks every other day, fasting   fasting glucose: highest 113, usually 102-106  post prandial glucose: none -Denies hypoglycemic/hyperglycemic symptoms -Current exercise: 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 -Educated on A1c and blood sugar goals; -Counseled to check feet daily and get yearly eye exams - up to date -Recommended to continue current medication  Heart Failure (Goal: manage symptoms and prevent exacerbations) -Controlled, per patient report of symptom control -Followed by cardiology  -Last ejection fraction: 30-35% (Date: 07/2019) -HF type: Systolic -NYHA Class: II (slight limitation of activity) -AHA HF Stage: C (Heart disease and symptoms present) -Current treatment:  Furosemide 40 mg - 1 tablet daily -Medications previously tried: none  -Patient denies abnormal swelling -Educated on Importance of weighing daily; if you gain more than 3 pounds in one day or 5 pounds in one week, call your cardiology office -Recommended to continue current medication  Hypothyroidism (Goal: TSH normal, no symptoms) -Controlled - TSH WNL 07/02/20, followed by endocrinology -Current treatment   Levothyroxine 150 mcg - 1 tablet daily before breakfast -Medications previously tried: none -Recommended to continue current medication  GERD (Goal: Improve acid reflux symptoms) -Controlled - per patient report -Current treatment   Omeprazole 40 mg - 1 capsule daily -Medications previously tried: none reported  -Symptoms stable, pt would like to try taper. He denies ucler/bleeding history. -Recommend - Will try tapering off PPI. Try omeprazole every other day.  BPH (Goal: Improve BPH symptoms) -Controlled - per patient report -Current treatment   Terazosin 10 mg - 1 tablet daily  Finasteride 5 mg - 1 tablet daily -Medications previously tried: none reported   -  Recommend continue current medications   OTC: Aspirin 81 mg daily Cetirizine 10 mg  Vitamin D3 125 mcg daily Melatonin 5 mg daily In an effort to reduce medications patient would like to trial off cetirizine. He is not having any allergy symptoms.   Patient Goals/Self-Care Activities  Patient will:  - focus on medication adherence by beginning to use a pill organizer  Follow Up Plan: Telephone follow up appointment with care management team member scheduled for:  12 months       [name] was given information about Chronic Care Management services today including:  CCM service includes personalized support from designated clinical staff supervised by his physician, including individualized plan of care and coordination with other care providers 24/7 contact phone numbers for assistance for urgent and routine care needs. Standard insurance, coinsurance, copays and deductibles apply for chronic care management only during months in which we provide at least 20 minutes of these services. Most insurances cover these services at 100%, however patients may be responsible for any copay, coinsurance and/or deductible if applicable. This service may help you avoid the need for more expensive face-to-face services. Only one practitioner may furnish and bill the service in a calendar month. The patient may stop CCM services at any time (effective at the end of the month) by phone call to the office staff.  Patient agreed to services and verbal consent obtained.   Patient verbalizes understanding of instructions provided today and agrees to view in [place].   Debbora Dus, PharmD Clinical Pharmacist [facility] at [facility], [place] [phone]

## 2020-11-01 ENCOUNTER — Other Ambulatory Visit: Payer: Self-pay | Admitting: Cardiovascular Disease

## 2020-11-16 ENCOUNTER — Other Ambulatory Visit: Payer: Self-pay | Admitting: Family Medicine

## 2020-11-16 DIAGNOSIS — E11319 Type 2 diabetes mellitus with unspecified diabetic retinopathy without macular edema: Secondary | ICD-10-CM

## 2020-12-10 DIAGNOSIS — Z9841 Cataract extraction status, right eye: Secondary | ICD-10-CM | POA: Diagnosis not present

## 2020-12-10 DIAGNOSIS — H52223 Regular astigmatism, bilateral: Secondary | ICD-10-CM | POA: Diagnosis not present

## 2020-12-10 DIAGNOSIS — Z9842 Cataract extraction status, left eye: Secondary | ICD-10-CM | POA: Diagnosis not present

## 2020-12-10 DIAGNOSIS — H348132 Central retinal vein occlusion, bilateral, stable: Secondary | ICD-10-CM | POA: Diagnosis not present

## 2020-12-27 ENCOUNTER — Telehealth: Payer: Self-pay

## 2020-12-27 NOTE — Progress Notes (Addendum)
Chronic Care Management Pharmacy Assistant   Name: Dominic Kane  MRN: [id number] DOB: [birth year]  Reason for Encounter: General Adherence   Recent office visits:  None since last CCM contact  Recent consult visits:  None since last CCM contact  Hospital visits:  None in previous 6 months  Medications: Outpatient Encounter Medications as of 12/27/2020  Medication Sig   aspirin EC 81 MG tablet Take 81 mg by mouth daily.   Blood Glucose Monitoring Suppl (ONE TOUCH ULTRA 2) w/Device KIT Use to check blood sugar daily.   cetirizine (ZYRTEC) 10 MG tablet Take 10 mg by mouth daily.   Cholecalciferol (D3 ADULT PO) Take 125 mcg by mouth. Take 1 (5000 IU each) daily   finasteride (PROSCAR) 5 MG tablet Take 1 tablet (5 mg total) by mouth daily.   furosemide (LASIX) 40 MG tablet TAKE 1 TABLET BY MOUTH  DAILY (DUE FOR AN APPT IN  APRIL AND CAN GET YEARLY  SUPPLY AT THAT VISIT.   PLEASE CALL TO SCHEDULE)   levothyroxine (SYNTHROID) 150 MCG tablet TAKE 1 TABLET BY MOUTH DAILY BEFORE BREAKFAST.   Melatonin 5 MG TABS Take 5 mg by mouth at bedtime.   Multiple Vitamins-Minerals (PRESERVISION AREDS 2 PO) Take by mouth in the morning and at bedtime.   omeprazole (PRILOSEC) 40 MG capsule Take 1 capsule (40 mg total) by mouth daily.   OneTouch Delica Lancets 92T MISC Use to check blood sugar two times a day. Dx: E11.9   ONETOUCH ULTRA test strip USE TO CHECK BLOOD SUGAR EVERY OTHER DAY E11.9   PRESCRIPTION MEDICATION every 4 (four) months. *Antibiotic Injection in Eye*   ramipril (ALTACE) 2.5 MG capsule TAKE 1 CAPSULE BY MOUTH EVERY DAY   rosuvastatin (CRESTOR) 10 MG tablet Take 1 tablet (10 mg total) by mouth daily.   terazosin (HYTRIN) 10 MG capsule TAKE 1 CAPSULE BY MOUTH EVERY DAY   No facility-administered encounter medications on file as of 12/27/2020.   Contacted Javarian Jakubiak Brunty on 12/26/2020 for general disease state and medication adherence call.   Patient is not > 5 days past due  for refill on the following medications per chart history:  [facility] Medications: Medication Name/mg Last Fill Days Supply Ramipril 2.5 mg            10/24/2020      90 Rosuvastatin 10 mg  11/28/2020 90  What concerns do you have about your medications? Patient stated he did not have any concerns at the moment. He is going great and enjoying the weather.   The patient denies side effects with his medications.   How often do you forget or accidentally miss a dose? Rarely  Do you use a pillbox? No  Are you having any problems getting your medications from your pharmacy? No  Has the cost of your medications been a concern? No  Since last visit with CPP, the following interventions have been made: Counseled to monitor BP at home prior to office visits and with any abnormal symptoms, document, and provide log at future appointments. Educated on Importance of weighing daily; if you gain more than 3 pounds in one day or 5 pounds in one week, call your cardiology office. Try omeprazole every other day.  The patient has not had an ED visit since last contact.   The patient denies problems with their health.   he denies  concerns or questions for Debbora Dus, Pharm. D at this time.   Counseled patient on:  Importance of taking medication daily without missed doses  Care Gaps: Annual wellness visit in last year? Yes 04/24/2020 Most Recent BP reading: 134/76 on 07/02/2020  If Diabetic: Most recent A1C reading: 6.1 on 07/02/2020 Last eye exam / retinopathy screening: 07/30/2020 Last diabetic foot exam: 08/23/2019  [facility] appointment on 12/31/2020 Endocrinology appointment on 01/21/2021  Debbora Dus, CPP notified  Marijean Niemann, [place] Assistant [phone]  I have reviewed the care management and care coordination activities outlined in this encounter and I am certifying that I agree with the content of this note. No further action required.  Debbora Dus, PharmD Clinical Pharmacist [facility] at [facility] [phone]

## 2020-12-31 ENCOUNTER — Other Ambulatory Visit: Payer: Self-pay

## 2020-12-31 ENCOUNTER — Ambulatory Visit: Payer: Medicare Other | Admitting: Endocrinology

## 2020-12-31 ENCOUNTER — Encounter (INDEPENDENT_AMBULATORY_CARE_PROVIDER_SITE_OTHER): Payer: Medicare Other | Admitting: Ophthalmology

## 2020-12-31 DIAGNOSIS — H35372 Puckering of macula, left eye: Secondary | ICD-10-CM

## 2020-12-31 DIAGNOSIS — H33301 Unspecified retinal break, right eye: Secondary | ICD-10-CM | POA: Diagnosis not present

## 2020-12-31 DIAGNOSIS — H35033 Hypertensive retinopathy, bilateral: Secondary | ICD-10-CM

## 2020-12-31 DIAGNOSIS — I10 Essential (primary) hypertension: Secondary | ICD-10-CM | POA: Diagnosis not present

## 2020-12-31 DIAGNOSIS — H43813 Vitreous degeneration, bilateral: Secondary | ICD-10-CM | POA: Diagnosis not present

## 2020-12-31 DIAGNOSIS — H34811 Central retinal vein occlusion, right eye, with macular edema: Secondary | ICD-10-CM

## 2020-12-31 DIAGNOSIS — H353132 Nonexudative age-related macular degeneration, bilateral, intermediate dry stage: Secondary | ICD-10-CM | POA: Diagnosis not present

## 2021-01-07 ENCOUNTER — Ambulatory Visit: Payer: Medicare Other | Admitting: Endocrinology

## 2021-01-21 ENCOUNTER — Other Ambulatory Visit (INDEPENDENT_AMBULATORY_CARE_PROVIDER_SITE_OTHER): Payer: Medicare Other

## 2021-01-21 ENCOUNTER — Other Ambulatory Visit: Payer: Self-pay

## 2021-01-21 ENCOUNTER — Ambulatory Visit (INDEPENDENT_AMBULATORY_CARE_PROVIDER_SITE_OTHER): Payer: Medicare Other | Admitting: Endocrinology

## 2021-01-21 VITALS — BP 140/80 | HR 61 | Ht 69.0 in | Wt 212.0 lb

## 2021-01-21 DIAGNOSIS — E890 Postprocedural hypothyroidism: Secondary | ICD-10-CM | POA: Diagnosis not present

## 2021-01-21 DIAGNOSIS — C73 Malignant neoplasm of thyroid gland: Secondary | ICD-10-CM | POA: Diagnosis not present

## 2021-01-21 DIAGNOSIS — E11319 Type 2 diabetes mellitus with unspecified diabetic retinopathy without macular edema: Secondary | ICD-10-CM | POA: Diagnosis not present

## 2021-01-21 LAB — POCT GLYCOSYLATED HEMOGLOBIN (HGB A1C): Hemoglobin A1C: 6.2 % — AB (ref 4.0–5.6)

## 2021-01-21 LAB — TSH: TSH: 4.89 u[IU]/mL (ref 0.35–5.50)

## 2021-01-21 LAB — T4, FREE: Free T4: 1.07 ng/dL (ref 0.60–1.60)

## 2021-01-21 NOTE — Patient Instructions (Addendum)
No medication is needed for the diabetes now. Blood tests are requested for you today.  We'll let you know about the results.    If the cancer test is worse, let's check the ultrasound.   Please come back for a follow-up appointment in 6 months.

## 2021-01-21 NOTE — Progress Notes (Signed)
Subjective:    Patient ID: Dominic Kane, male    DOB: May 31, 1940, 80 y.o.   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   Therapy: no medication now DKA: never Severe hypoglycemia: never Pancreatitis: never Pancreatic imaging: CT (2014): scattered parenchymal calcification.   Other: he has never taken insulin.   Interval history: pt states he feels well in general. No recent steroids. 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   4/18: thyroidectomy:  pT3b, pN0 6/18: RAI, on synthroid, 122 mCi, stimulated by thyrogen.   6/18: post-therapy scan pos at thyroid bed only.  10/18: TG is undetectable (ab pos).  4/19: TG is undetectable (ab pos, but lower titer).   5/20: TG= 0.3 (ab neg).   10/21 TG= 0.3 (ab neg) 4/22 TG= 0.3 (ab neg) He does not notice any neck nodule or pain.  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 15 yrs ago x1    Hypertrophy of prostate without urinary obstruction and other lower urinary tract symptoms (LUTS)    Lipoprotein deficiencies    Other and unspecified hyperlipidemia    Other primary cardiomyopathies    Pain in joint, lower leg    Personal history of colonic polyps    Personal history of other diseases of digestive system    Pes anserinus tendinitis or bursitis    left shoulder remains an issue   Sinoatrial node dysfunction ([place])    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 Nevada Crane   CATARACT EXTRACTION, BILATERAL     COLONOSCOPY W/ POLYPECTOMY     DOBUTAMINE STRESS ECHO  1/07   Lateral hypokinesis but no ischemia   ESOPHAGOGASTRODUODENOSCOPY  11/07   Schatzki's ring, non bleeding erosive gastropathy [place]   KNEE SURGERY Left 1947   LYMPH NODE DISSECTION N/A 08/05/2016   Procedure: LIMITED LYMPH NODE DISSECTION;  Surgeon: Armandina Gemma, MD;  Location: [facility];  Service: General;  Laterality: N/A;   Pulmonary functioning tests  1. 2003  2. 2005  1. Diminished lung capacity  2. 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  Tobacco Use   Smoking status: Former    Types: Cigarettes    Quit date: 07/03/1980    Years since quitting: 40.5   Smokeless tobacco: Never  Vaping Use   Vaping Use: Never used  Substance and Sexual Activity   Alcohol use: Yes    Alcohol/week: 0.0 standard drinks    Comment: occassionally   Drug use: No   Sexual activity: Not on file  Other Topics Concern   Not on file  Social History Narrative   Married 1 daughter   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   2 caffeinated drinks a day no alcohol tobacco or drug use    former smoker   Investment banker, operational of Radio broadcast assistant Strain: Low Risk    Difficulty of Paying Living Expenses: Not very hard  Food Insecurity: No Food Insecurity   Worried About Charity fundraiser in the Last Year: Never true   Arboriculturist in the Last Year: Never true  Transportation Needs: No Transportation Needs   Lack of Transportation (Medical): No   Lack of Transportation (Non-Medical): No  Physical Activity: Inactive   Days of Exercise per Week: 0 days   Minutes of Exercise per Session: 0 min  Stress: No Stress Concern Present   Feeling of Stress : Not at all  Social Connections: Not on file  Intimate Partner Violence: Not At Risk   Fear of Current or Ex-Partner: No   Emotionally Abused: No   Physically Abused: No   Sexually Abused: No    Current Outpatient Medications on File Prior to Visit  Medication Sig Dispense Refill   aspirin EC 81 MG tablet Take 81 mg by mouth daily.     Blood Glucose Monitoring Suppl (ONE TOUCH ULTRA 2) w/Device KIT Use to check blood sugar daily. 1 each 0   cetirizine (ZYRTEC) 10 MG tablet Take 10 mg by mouth daily.     Cholecalciferol (D3 ADULT PO) Take 125 mcg by mouth. Take 1 (5000 IU each) daily     finasteride (PROSCAR) 5 MG tablet Take 1 tablet (5 mg total) by mouth daily. 90 tablet 3   furosemide (LASIX) 40 MG tablet  TAKE 1 TABLET BY MOUTH  DAILY (DUE FOR AN APPT IN  APRIL AND CAN GET YEARLY  SUPPLY AT THAT VISIT.   PLEASE CALL TO SCHEDULE) 90 tablet 3   levothyroxine (SYNTHROID) 150 MCG tablet TAKE 1 TABLET BY MOUTH DAILY BEFORE BREAKFAST. 90 tablet 3   Melatonin 5 MG TABS Take 5 mg by mouth at bedtime.     Multiple Vitamins-Minerals (PRESERVISION AREDS 2 PO) Take by mouth in the morning and at bedtime.     omeprazole (  PRILOSEC) 40 MG capsule Take 1 capsule (40 mg total) by mouth daily. 90 capsule 3   OneTouch Delica Lancets 03E MISC Use to check blood sugar two times a day. Dx: E11.9 200 each 3   ONETOUCH ULTRA test strip USE TO CHECK BLOOD SUGAR EVERY OTHER DAY E11.9 50 strip 3   PRESCRIPTION MEDICATION every 4 (four) months. *Antibiotic Injection in Eye*     ramipril (ALTACE) 2.5 MG capsule TAKE 1 CAPSULE BY MOUTH EVERY DAY 90 capsule 3   rosuvastatin (CRESTOR) 10 MG tablet Take 1 tablet (10 mg total) by mouth daily. 90 tablet 1   terazosin (HYTRIN) 10 MG capsule TAKE 1 CAPSULE BY MOUTH EVERY DAY 90 capsule 3   No current facility-administered medications on file prior to visit.    Allergies  Allergen Reactions   Codeine Nausea Only   Sulfonamide Derivatives Hives    Family History  Problem Relation Age of Onset   Heart failure Mother    Coronary artery disease Mother    Stroke Mother    Hypothyroidism Mother    Cancer Father        LUNG   Cancer Sister        BREAST   Heart failure Maternal Grandmother    Hypothyroidism Daughter    Colon cancer Neg Hx     BP 140/80 (BP Location: Right Arm, Patient Position: Sitting, Cuff Size: Normal)   Pulse 61   Ht 5' 9"  (1.753 m)   Wt 212 lb (96.2 kg)   SpO2 94%   BMI 31.31 kg/m    Review of Systems     Objective:   Physical Exam Pulses: dorsalis pedis intact bilat.   MSK: no deformity of the feet CV: trace bilat leg edema, and bilat VV's.   Skin:  no ulcer on the feet.  normal color and temp on the feet. Neuro: sensation is intact to  touch on the feet, but decreased from normal.   Lab Results  Component Value Date   HGBA1C 6.2 (A) 01/21/2021   Lab Results  Component Value Date   TSH 4.89 01/21/2021      Assessment & Plan:  DM: stable off rx.   PTC: recheck labs today.    Hypothyroidism.  well-controlled.  Please continue the same synthroid.  Patient Instructions  No medication is needed for the diabetes now. Blood tests are requested for you today.  We'll let you know about the results.    If the cancer test is worse, let's check the ultrasound.   Please come back for a follow-up appointment in 6 months.

## 2021-01-22 LAB — THYROGLOBULIN LEVEL: Thyroglobulin: 0.1 ng/mL — ABNORMAL LOW

## 2021-01-22 LAB — THYROGLOBULIN ANTIBODY: Thyroglobulin Ab: <1 IU/mL (ref <=1)

## 2021-01-23 ENCOUNTER — Encounter: Payer: Self-pay | Admitting: Nurse Practitioner

## 2021-01-23 ENCOUNTER — Telehealth (INDEPENDENT_AMBULATORY_CARE_PROVIDER_SITE_OTHER): Payer: Medicare Other | Admitting: Nurse Practitioner

## 2021-01-23 ENCOUNTER — Other Ambulatory Visit: Payer: Self-pay

## 2021-01-23 VITALS — BP 106/74 | HR 64 | Temp 102.3°F | Ht 69.0 in | Wt 210.0 lb

## 2021-01-23 DIAGNOSIS — U071 COVID-19: Secondary | ICD-10-CM | POA: Insufficient documentation

## 2021-01-23 MED ORDER — MOLNUPIRAVIR EUA 200MG CAPSULE: 4.0000 | ORAL_CAPSULE | Freq: Two times a day (BID) | ORAL | 0 refills | Status: AC

## 2021-01-23 NOTE — Progress Notes (Signed)
Patient ID: Dominic Kane, male    DOB: Jul 25, 1940, 80 y.o.   MRN: [id number]  Virtual visit completed through [employer], a video enabled telemedicine application. Due to national recommendations of social distancing due to COVID-19, a virtual visit is felt to be most appropriate for this patient at this time. Reviewed limitations, risks, security and privacy concerns of performing a virtual visit and the availability of in person appointments. I also reviewed that there may be a patient responsible charge related to this service. The patient agreed to proceed.   After attempting to connect with a video enabled device we were unsuccessful and reverted to a telephone encounter.  Patient location: home Provider location: [place] at [facility], office Persons participating in this virtual visit: patient, provider   If any vitals were documented, they were collected by patient at home unless specified below.    BP 106/74   Pulse 64   Temp (!) 102.3 F (39.1 C) (Oral)   Ht 5' 9"  (1.753 m)   Wt 210 lb (95.3 kg)   BMI 31.01 kg/m    CC: Covid 19 Subjective:   HPI: Dominic Kane is a 80 y.o. male presenting on 01/23/2021 for Covid Positive, Cough, and Nasal Congestion   Symptoms started on 01/22/2021. Tested covid at home today. It was positive Moderna x 2 with 2 boosters Tylenol with some relief    Relevant past medical, surgical, family and social history reviewed and updated as indicated. Interim medical history since our last visit reviewed. Allergies and medications reviewed and updated. Outpatient Medications Prior to Visit  Medication Sig Dispense Refill   aspirin EC 81 MG tablet Take 81 mg by mouth daily.     Blood Glucose Monitoring Suppl (ONE TOUCH ULTRA 2) w/Device KIT Use to check blood sugar daily. 1 each 0   cetirizine (ZYRTEC) 10 MG tablet Take 10 mg by mouth daily.     Cholecalciferol (D3 ADULT PO) Take 125 mcg by mouth. Take 1 (5000 IU each) daily      finasteride (PROSCAR) 5 MG tablet Take 1 tablet (5 mg total) by mouth daily. 90 tablet 3   furosemide (LASIX) 40 MG tablet TAKE 1 TABLET BY MOUTH  DAILY (DUE FOR AN APPT IN  APRIL AND CAN GET YEARLY  SUPPLY AT THAT VISIT.   PLEASE CALL TO SCHEDULE) 90 tablet 3   levothyroxine (SYNTHROID) 150 MCG tablet TAKE 1 TABLET BY MOUTH DAILY BEFORE BREAKFAST. 90 tablet 3   Melatonin 5 MG TABS Take 5 mg by mouth at bedtime.     Multiple Vitamins-Minerals (PRESERVISION AREDS 2 PO) Take by mouth in the morning and at bedtime.     omeprazole (PRILOSEC) 40 MG capsule Take 1 capsule (40 mg total) by mouth daily. 90 capsule 3   OneTouch Delica Lancets 59F MISC Use to check blood sugar two times a day. Dx: E11.9 200 each 3   ONETOUCH ULTRA test strip USE TO CHECK BLOOD SUGAR EVERY OTHER DAY E11.9 50 strip 3   PRESCRIPTION MEDICATION every 4 (four) months. *Antibiotic Injection in Eye*     ramipril (ALTACE) 2.5 MG capsule TAKE 1 CAPSULE BY MOUTH EVERY DAY 90 capsule 3   rosuvastatin (CRESTOR) 10 MG tablet Take 1 tablet (10 mg total) by mouth daily. 90 tablet 1   terazosin (HYTRIN) 10 MG capsule TAKE 1 CAPSULE BY MOUTH EVERY DAY 90 capsule 3   No facility-administered medications prior to visit.     Per HPI unless  specifically indicated in ROS section below Review of Systems  Constitutional:  Positive for chills, fatigue and fever.  HENT:  Positive for congestion, rhinorrhea and sinus pressure. Negative for sore throat.   Respiratory:  Positive for cough. Negative for shortness of breath.   Cardiovascular:  Negative for chest pain.  Gastrointestinal:  Positive for nausea. Negative for abdominal pain, diarrhea and vomiting.  Musculoskeletal:  Positive for arthralgias.  Neurological:  Positive for headaches. Negative for dizziness and light-headedness.  Objective:  BP 106/74   Pulse 64   Temp (!) 102.3 F (39.1 C) (Oral)   Ht 5' 9"  (1.753 m)   Wt 210 lb (95.3 kg)   BMI 31.01 kg/m   Wt Readings from Last 3  Encounters:  01/23/21 210 lb (95.3 kg)  01/21/21 212 lb (96.2 kg)  07/02/20 212 lb 3.2 oz (96.3 kg)       Physical exam: Gen: alert, NAD, not ill appearing Pulm: speaks in complete sentences without increased work of breathing Psych: normal mood, normal thought content      Results for orders placed or performed in visit on 01/21/21  T4, free  Result Value Ref Range   Free T4 1.07 0.60 - 1.60 ng/dL  TSH  Result Value Ref Range   TSH 4.89 0.35 - 5.50 uIU/mL  Thyroglobulin antibody  Result Value Ref Range   Thyroglobulin Ab <1 < or = 1 IU/mL  Thyroglobulin Level  Result Value Ref Range   Thyroglobulin 0.1 (L) ng/mL   Comment     Assessment & Plan:   Problem List Items Addressed This Visit       Other   COVID-19 - Primary    Tested positive for COVID-19 at home.  He has been vaccinated.  Given risk factors and age we will go ahead and treat.  Did discuss using molnupiravir.  Discussed with patient possible side effects and it is EUA only.  Patient acknowledged and elects to continue with treatment.  Did discuss signs and symptoms when he is being seen in urgent or emergently.  Continue to monitor. Start Molnupiravir soon as possible.      Relevant Medications   molnupiravir EUA (LAGEVRIO) 200 mg CAPS capsule     No orders of the defined types were placed in this encounter.  No orders of the defined types were placed in this encounter.  Phone call length was 8 minutes and 55 seconds  I discussed the assessment and treatment plan with the patient. The patient was provided an opportunity to ask questions and all were answered. The patient agreed with the plan and demonstrated an understanding of the instructions. The patient was advised to call back or seek an in-person evaluation if the symptoms worsen or if the condition fails to improve as anticipated.  Follow up plan: No follow-ups on file.  Romilda Garret, NP

## 2021-01-23 NOTE — Assessment & Plan Note (Signed)
Tested positive for COVID-19 at home.  He has been vaccinated.  Given risk factors and age we will go ahead and treat.  Did discuss using molnupiravir.  Discussed with patient possible side effects and it is EUA only.  Patient acknowledged and elects to continue with treatment.  Did discuss signs and symptoms when he is being seen in urgent or emergently.  Continue to monitor. Start Molnupiravir soon as possible.

## 2021-01-29 ENCOUNTER — Other Ambulatory Visit: Payer: Self-pay | Admitting: Cardiovascular Disease

## 2021-02-11 ENCOUNTER — Telehealth: Payer: Self-pay | Admitting: Family Medicine

## 2021-02-11 NOTE — Telephone Encounter (Signed)
Called patient and got him scheduled for 2/3 for labs and 2/7 for CPE.

## 2021-02-11 NOTE — Telephone Encounter (Signed)
Please schedule CPE with fasting labs prior for after 04/25/2021.  He already has his [place] with nurse.

## 2021-04-04 ENCOUNTER — Encounter: Payer: Self-pay | Admitting: Cardiovascular Disease

## 2021-04-04 ENCOUNTER — Other Ambulatory Visit: Payer: Self-pay

## 2021-04-04 ENCOUNTER — Ambulatory Visit: Payer: PPO | Admitting: Cardiovascular Disease

## 2021-04-04 VITALS — BP 122/82 | HR 68 | Ht 71.0 in | Wt 205.0 lb

## 2021-04-04 DIAGNOSIS — I712 Thoracic aortic aneurysm, without rupture, unspecified: Secondary | ICD-10-CM | POA: Diagnosis not present

## 2021-04-04 DIAGNOSIS — I428 Other cardiomyopathies: Secondary | ICD-10-CM

## 2021-04-04 DIAGNOSIS — I5022 Chronic systolic (congestive) heart failure: Secondary | ICD-10-CM

## 2021-04-04 DIAGNOSIS — I2510 Atherosclerotic heart disease of native coronary artery without angina pectoris: Secondary | ICD-10-CM | POA: Diagnosis not present

## 2021-04-04 DIAGNOSIS — E7800 Pure hypercholesterolemia, unspecified: Secondary | ICD-10-CM | POA: Diagnosis not present

## 2021-04-04 NOTE — Patient Instructions (Signed)
Medication Instructions:  No changes *If you need a refill on your cardiac medications before your next appointment, please call your pharmacy*   Lab Work: Plan for lab work prior to [place] in May 2023.   Testing/Procedures: CT Chest - Aorta -- due in May 2023   Follow-Up: At [facility], you and your health needs are our priority.  As part of our continuing mission to provide you with exceptional heart care, we have created designated Provider Care Teams.  These Care Teams include your primary Cardiologist (physician) and Advanced Practice Providers (APPs -  Physician Assistants and Nurse Practitioners) who all work together to provide you with the care you need, when you need it.   Your next appointment:   12 month(s)  The format for your next appointment:   In Person  Provider:   Lauree Chandler, MD     Other Instructions

## 2021-04-04 NOTE — Progress Notes (Signed)
i  Chief Complaint  Patient presents with   Follow-up    CAD, cardiomyopathy   History of Present Illness: 81 yo male with a past medical history significant for diastolic and systolic CHF, hypertension, nonobstructive coronary artery disease, thoracic aortic aneurysm, non-ischemic cardiomyopathy, benign prostatic hypertrophy and hyperlipidemia who is here today for cardiac follow up. Prior to 2005, he was known to have LVEF around 35%. He had an episode of chest pain in 2005 leading to a cardiac cath May 2005 which showed mild non-obstructive disease. Ejection fraction was noted to be 65%. An echocardiogram during that admission in May 2005 in [place] showed an ejection fraction of 55-60% with the ascending aorta at the sinotubular junction measuring 4.1 cm. LVEF has been noted to be around 45% on studies in 2010, 2011. Echo September 2013 suggested LVEF was around 30%. Cardiac MRI December 2014 showed mild enlargement of the aortic root (4.2 cm) and and EF of 44%. Last stress test 9/13 without ischemia. Echo May 2021 with LVEF=30-35% with global hypokinesis. Mild AI. Mild dilation of the ascending aorta. Nuclear stress test May 2021 with no ischemia. Chest CTA May 2022 with stable ectasia of the ascending aorta with 4.3 cm dilated ascending aorta.   He is here today for follow up. The patient denies any chest pain, dyspnea, palpitations, lower extremity edema, orthopnea, PND, dizziness, near syncope or syncope.    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 15 yrs ago x1   Hypertrophy of prostate without urinary obstruction and other lower urinary tract symptoms (LUTS)    Lipoprotein deficiencies    Other and unspecified hyperlipidemia    Other primary cardiomyopathies    Pain in joint, lower leg    Personal history of colonic polyps    Personal history of other diseases of digestive system    Pes anserinus tendinitis or bursitis    left shoulder remains an issue   Sinoatrial node dysfunction ([place])    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 [place] Crane   CATARACT EXTRACTION, BILATERAL     COLONOSCOPY W/ POLYPECTOMY     DOBUTAMINE STRESS ECHO  1/07   Lateral hypokinesis but no  ischemia   ESOPHAGOGASTRODUODENOSCOPY  11/07   Schatzki's ring, non bleeding erosive gastropathy [place]   KNEE SURGERY Left 1947   LYMPH NODE DISSECTION N/A 08/05/2016   Procedure: LIMITED LYMPH NODE DISSECTION;  Surgeon: Armandina Gemma, MD;  Location: [facility];  Service: General;  Laterality: N/A;   Pulmonary functioning tests  1. 2003  2. 2005   1. Diminished lung capacity  2. Stable   THYROIDECTOMY N/A 08/05/2016   Procedure: TOTAL THYROIDECTOMY;  Surgeon: Armandina Gemma, MD;  Location: [facility];  Service: General;  Laterality: N/A;    TONSILLECTOMY     TOTAL KNEE ARTHROPLASTY Left 03/18/2016   Procedure: LEFT TOTAL KNEE ARTHROPLASTY;  Surgeon: Paralee Cancel, MD;  Location: [facility];  Service: Orthopedics;  Laterality: Left;    Current Outpatient Medications  Medication Sig Dispense Refill   aspirin EC 81 MG tablet Take 81 mg by mouth daily.     Blood Glucose Monitoring Suppl (ONE TOUCH ULTRA 2) w/Device KIT Use to check blood sugar daily. 1 each 0   cetirizine (ZYRTEC) 10 MG tablet Take 10 mg by mouth daily.     Cholecalciferol (D3 ADULT PO) Take 125 mcg by mouth. Take 1 (5000 IU each) daily     finasteride (PROSCAR) 5 MG tablet Take 1 tablet (5 mg total) by mouth daily. 90 tablet 3   furosemide (LASIX) 40 MG tablet TAKE 1 TABLET BY MOUTH  DAILY (DUE FOR AN APPT IN  APRIL AND CAN GET YEARLY  SUPPLY AT THAT VISIT.   PLEASE CALL TO SCHEDULE) 90 tablet 3   levothyroxine (SYNTHROID) 150 MCG tablet TAKE 1 TABLET BY MOUTH DAILY BEFORE BREAKFAST. 90 tablet 3   Melatonin 5 MG TABS Take 5 mg by mouth at bedtime.     Multiple Vitamins-Minerals (PRESERVISION AREDS 2 PO) Take by mouth in the morning and at bedtime.     omeprazole (PRILOSEC) 40 MG capsule Take 1 capsule (40 mg total) by mouth daily. 90 capsule 3   OneTouch Delica Lancets 25O MISC Use to check blood sugar two times a day. Dx: E11.9 200 each 3   ONETOUCH ULTRA test strip USE TO CHECK BLOOD SUGAR EVERY OTHER DAY E11.9 50 strip 3   PRESCRIPTION MEDICATION every 4 (four) months. *Antibiotic Injection in Eye*     ramipril (ALTACE) 2.5 MG capsule TAKE 1 CAPSULE BY MOUTH EVERY DAY 90 capsule 3   rosuvastatin (CRESTOR) 10 MG tablet TAKE 1 TABLET BY MOUTH  DAILY 90 tablet 3   terazosin (HYTRIN) 10 MG capsule TAKE 1 CAPSULE BY MOUTH EVERY DAY 90 capsule 0   No current facility-administered medications for this visit.    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  Tobacco Use   Smoking status: Former    Types: Cigarettes    Quit date: 07/03/1980    Years since quitting: 40.7   Smokeless tobacco: Never  Vaping Use   Vaping Use: Never used  Substance and Sexual Activity   Alcohol use: Yes    Alcohol/week: 0.0 standard drinks    Comment: occassionally   Drug use: No   Sexual  activity: Not on file  Other Topics Concern   Not on file  Social History Narrative   Married 1 daughter   Retired from [employer] 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   2 caffeinated drinks a day no alcohol tobacco or drug use    former smoker   Investment banker, operational of Radio broadcast assistant Strain: Low Risk    Difficulty of Paying Living Expenses: Not very hard  Food Insecurity: No Food Insecurity   Worried About Charity fundraiser in the Last Year: Never true   Arboriculturist in the Last Year: Never true  Transportation Needs: No Transportation Needs   Lack of Transportation (Medical): No   Lack of Transportation (Non-Medical): No  Physical Activity: Inactive   Days of Exercise per Week: 0 days   Minutes of Exercise per Session: 0 min  Stress: No Stress Concern Present   Feeling of Stress : Not at all  Social Connections: Not on file  Intimate Partner Violence: Not At Risk   Fear of Current or Ex-Partner: No   Emotionally Abused: No   Physically Abused: No   Sexually Abused: No    Family History  Problem Relation Age of Onset   Heart failure Mother    Coronary artery disease Mother    Stroke Mother    Hypothyroidism Mother    Cancer Father        LUNG   Cancer Sister        BREAST   Heart failure Maternal Grandmother    Hypothyroidism Daughter    Colon cancer Neg Hx     Review of Systems:  As stated in the  HPI and otherwise negative.   BP 122/82    Pulse 68    Ht 5' 11"  (1.803 m)    Wt 205 lb (93 kg)    SpO2 97%    BMI 28.59 kg/m   Physical Examination:  General: Well developed, well nourished, NAD  HEENT: OP clear, mucus membranes moist  SKIN: warm, dry. No rashes. Neuro: No focal deficits  Musculoskeletal: Muscle strength 5/5 all ext  Psychiatric: Mood and affect normal  Neck: No JVD, no carotid bruits, no thyromegaly, no lymphadenopathy.  Lungs:Clear bilaterally, no wheezes, rhonci, crackles Cardiovascular: Regular rate and rhythm. No murmurs, gallops or rubs. Abdomen:Soft. Bowel sounds present. Non-tender.  Extremities: No lower extremity edema. Pulses are 2 + in the bilateral DP/PT.  Echo 08/10/19:   1. Left ventricular ejection fraction, by estimation, is 30 to 35%. The  left ventricle has moderately decreased function. The left ventricle has  no regional wall motion abnormalities. There is mild concentric left  ventricular hypertrophy. Left  ventricular diastolic parameters are consistent with Grade I diastolic  dysfunction (impaired relaxation). Elevated left ventricular end-diastolic  pressure.   2. Right ventricular systolic function is normal. The right ventricular  size is normal. There is normal pulmonary artery systolic pressure.   3. Left atrial size was moderately dilated.   4. The mitral valve is normal in structure. Trivial mitral valve  regurgitation. No evidence of mitral stenosis.   5. The aortic valve is tricuspid. Aortic valve regurgitation is mild. No  aortic stenosis is present.   6. Aortic dilatation noted. 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   7. The inferior vena cava is normal in size with greater than 50%  respiratory variability, suggesting right atrial pressure of 3 mmHg.  EKG:  EKG is  ordered today. The ekg ordered today demonstrates: sinus, RBBB  Recent Labs: 05/02/2020: ALT 11 07/17/2020: BUN 24; Creatinine, Ser  1.06; Potassium 3.9; Sodium 140 01/21/2021: TSH 4.89   Lipid Panel    Component Value Date/Time   CHOL 127 05/02/2020 0856   TRIG 56.0 05/02/2020 0856   HDL 49.20 05/02/2020 0856   CHOLHDL 3 05/02/2020 0856   VLDL 11.2 05/02/2020 0856   LDLCALC 67 05/02/2020 0856     Wt Readings from Last 3 Encounters:  04/04/21 205 lb (93 kg)  01/23/21 210 lb (95.3 kg)  01/21/21 212 lb (96.2 kg)     Other studies Reviewed: Additional studies/ records that were reviewed today include: . Review of the above records demonstrates:    Assessment and Plan:   1. CAD without angina:  He is known to have non-obstructive disease by cath in 2005. Nuclear stress test in May 2021 with no ischemia. No chest pain. Continue ASA and statin  2. Non-ischemic Cardiomyopathy: LVEF around 35% on echo May 2021 and 46% on nuclear stress test May 2021. He is not on a beta blocker due to bradycardia. Continue Ace-inh  3. Thoracic aortic aneurysm:  4.3c m by CTA May 2022. Stable for years. Repeat May 2023  4. HLD: LDL is at goal in February 2022. Will continue statin  5. Chronic systolic CHF: No evidence of volume overload. Weight is stable. Continue Lasix  6. Carotid bruit, right: Carotid dopplers January 2016 with no evidence of carotid artery disease.   7. Diabetes, type 2: diet controlled. Followed in primary care.   Current medicines are reviewed at length with the patient today.  The patient does not have concerns regarding medicines.  The following changes have been made:  no change  Labs/ tests ordered today include:   Orders Placed This Encounter  Procedures   CT ANGIO CHEST AORTA W/CM & OR WO/CM   Basic metabolic panel   EKG 50-KXFG   Disposition:   F/U with me in 12  months  Signed, Lauree Chandler, MD 04/04/2021 8:43 AM    [facility] [place], [place], [place]  [address] Phone: [phone]; Fax: [phone]

## 2021-04-10 ENCOUNTER — Telehealth: Payer: Self-pay

## 2021-04-10 NOTE — Progress Notes (Addendum)
Chronic Care Management Pharmacy Assistant   Name: Dominic Kane  MRN: [id number] DOB: February 08, 1941  Reason for Encounter: CCM (Diabetes and Hypertension Disease State)   Recent office visits:  None since last CCM contact  Recent consult visits:  04/04/2021 - Cardiology - Patient presented for Atherosclerosis of native coronary artery of native heart without angina pectoris. Labs: BMP. Ordered: CT Angio Chest Aorta and EKG. 01/23/2021 - Karl Ito, NP - Pain Medicine - Video Visit - Patient presented for Covid positive. Start: molnupiravir EUA (LAGEVRIO) 200 mg CAPS capsule. 01/21/2021 - Endocrinology - Patient presented for papillary thyroid carcinoma. Labs: A1c, Thyroglobulin level, T4, Thyroglobulin antibody and TSH.   Hospital visits:  None in previous 6 months  Medications: Outpatient Encounter Medications as of 04/10/2021  Medication Sig   aspirin EC 81 MG tablet Take 81 mg by mouth daily.   Blood Glucose Monitoring Suppl (ONE TOUCH ULTRA 2) w/Device KIT Use to check blood sugar daily.   cetirizine (ZYRTEC) 10 MG tablet Take 10 mg by mouth daily.   Cholecalciferol (D3 ADULT PO) Take 125 mcg by mouth. Take 1 (5000 IU each) daily   finasteride (PROSCAR) 5 MG tablet Take 1 tablet (5 mg total) by mouth daily.   furosemide (LASIX) 40 MG tablet TAKE 1 TABLET BY MOUTH  DAILY (DUE FOR AN APPT IN  APRIL AND CAN GET YEARLY  SUPPLY AT THAT VISIT.   PLEASE CALL TO SCHEDULE)   levothyroxine (SYNTHROID) 150 MCG tablet TAKE 1 TABLET BY MOUTH DAILY BEFORE BREAKFAST.   Melatonin 5 MG TABS Take 5 mg by mouth at bedtime.   Multiple Vitamins-Minerals (PRESERVISION AREDS 2 PO) Take by mouth in the morning and at bedtime.   omeprazole (PRILOSEC) 40 MG capsule Take 1 capsule (40 mg total) by mouth daily.   OneTouch Delica Lancets 93X MISC Use to check blood sugar two times a day. Dx: E11.9   ONETOUCH ULTRA test strip USE TO CHECK BLOOD SUGAR EVERY OTHER DAY E11.9   PRESCRIPTION MEDICATION every  4 (four) months. *Antibiotic Injection in Eye*   ramipril (ALTACE) 2.5 MG capsule TAKE 1 CAPSULE BY MOUTH EVERY DAY   rosuvastatin (CRESTOR) 10 MG tablet TAKE 1 TABLET BY MOUTH  DAILY   terazosin (HYTRIN) 10 MG capsule TAKE 1 CAPSULE BY MOUTH EVERY DAY   No facility-administered encounter medications on file as of 04/10/2021.   Recent Relevant Labs: Lab Results  Component Value Date/Time   HGBA1C 6.2 (A) 01/21/2021 09:21 AM   HGBA1C 6.1 (A) 07/02/2020 08:35 AM   HGBA1C 6.6 (H) 05/02/2020 08:56 AM   HGBA1C 6.7 (H) 01/04/2020 09:39 AM    Kidney Function Lab Results  Component Value Date/Time   CREATININE 1.06 07/17/2020 07:45 AM   CREATININE 1.13 05/02/2020 08:56 AM   GFR 61.90 05/02/2020 08:56 AM   GFRNONAA 70 07/20/2019 09:13 AM   GFRAA 81 07/20/2019 09:13 AM   Contacted patient on 04/10/2021 to discuss diabetes disease state.   Current antihyperglycemic regimen:  No Pharmacotherapy    What diet changes have been made to improve diabetes control? Patient states he watches his sugar and carbs closely.   What recent interventions/DTPs have been made to improve glycemic control:  No recent interventions  Have there been any recent hospitalizations or ED visits since last visit with CPP? No  Patient denies hypoglycemic symptoms, including Pale, Sweaty, Shaky, Hungry, Nervous/irritable, and Vision changes  Patient denies hyperglycemic symptoms, including blurry vision, excessive thirst, fatigue, polyuria, and weakness  How often are you checking your blood sugar? Patient takes his blood sugar approximately every other day. He checks his blood sugar fasting before he eats.   During the week, how often does your blood glucose drop below 70? Never  Are you checking your feet daily/regularly? Yes  Contacted patient on 04/10/2021 to discuss hypertension disease state  Current antihypertensive regimen:   Ramipril 2.5 mg - 1 capsule daily   Patient verbally confirms he is taking  the above medications as directed. Yes  How often are you checking your Blood Pressure?  Patient states he checks his blood pressure approximately every 3rd day.  Wrist or arm cuff: Arm cuff Caffeine intake: Patient drinks coffee (1 cup a day), unsweet tea (quart a day), Diet Coke (2 a day) Salt intake:Patient does not add salt.  Over the counter medications including pseudoephedrine or NSAIDs? Patient takes Tylenol usually two a day due to pain with lower back.   Any readings above 180/120? No  What recent interventions/DTPs have been made by any provider to improve Blood Pressure control since last CPP Visit: No recent interventions.   Any recent hospitalizations or ED visits since last visit with CPP? No  What diet changes have been made to improve Blood Pressure Control?  Patient watches his salt intake very closely.   What exercise is being done to improve your Blood Pressure Control?  Patient is always on the go.  Adherence Review: Is the patient currently on ACE/ARB medication? Yes Does the patient have >5 day gap between last estimated fill dates? No  Star Rating Drugs:  Medication:  Last Fill: Day Supply Rosuvastatin 10 mg 02/17/2021 90   Ramipril 2.5 mg 01/18/2021 90   Care Gaps: Annual wellness visit in last year? Yes 04/24/2020 Most Recent BP reading: 122/82 on 04/05/2021  If Diabetic: Most recent A1C reading: 6.2 on 01/21/2021 Last eye exam / retinopathy screening:Up to date Last diabetic foot exam: Up to date  Upcoming appointments: PCP appointment on 04/25/2021 for AWV PCP appointment on 04/30/2021 for Physical CCM appointment on 08/27/2021 at 8:30 with Karoline Caldwell, CPP notified  Marijean Niemann, [place] Assistant [phone]  Time Spent: 4 Minutes  I have reviewed the care management and care coordination activities outlined in this encounter and I am certifying that I agree with the content of this note. No further  action required.  Debbora Dus, PharmD Clinical Pharmacist [facility] at [facility] [phone]

## 2021-04-12 DIAGNOSIS — D225 Melanocytic nevi of trunk: Secondary | ICD-10-CM | POA: Diagnosis not present

## 2021-04-12 DIAGNOSIS — L821 Other seborrheic keratosis: Secondary | ICD-10-CM | POA: Diagnosis not present

## 2021-04-12 DIAGNOSIS — D485 Neoplasm of uncertain behavior of skin: Secondary | ICD-10-CM | POA: Diagnosis not present

## 2021-04-12 DIAGNOSIS — X32XXXD Exposure to sunlight, subsequent encounter: Secondary | ICD-10-CM | POA: Diagnosis not present

## 2021-04-12 DIAGNOSIS — L570 Actinic keratosis: Secondary | ICD-10-CM | POA: Diagnosis not present

## 2021-04-12 DIAGNOSIS — Z1283 Encounter for screening for malignant neoplasm of skin: Secondary | ICD-10-CM | POA: Diagnosis not present

## 2021-04-15 ENCOUNTER — Telehealth: Payer: Self-pay | Admitting: Family Medicine

## 2021-04-15 DIAGNOSIS — E559 Vitamin D deficiency, unspecified: Secondary | ICD-10-CM

## 2021-04-15 DIAGNOSIS — E11319 Type 2 diabetes mellitus with unspecified diabetic retinopathy without macular edema: Secondary | ICD-10-CM

## 2021-04-15 NOTE — Telephone Encounter (Signed)
-----   Message from Ellamae Sia sent at 04/03/2021  8:06 AM EST ----- Regarding: Lab orders for Friday, 2.3.23 Patient is scheduled for CPX labs, please order future labs, Thanks , Karna Christmas

## 2021-04-19 NOTE — Progress Notes (Addendum)
I called patient in regards to his blood pressure log and his blood sugar log as previously discussed.  DATE  GLUCOSE  BLOOD PRESSURE  PULSE 01/27  114   112/76    83 01/26  112   131/79    65 01/25  110   136/83    59 01/24  117   110/78    76 01/23  114   110/70    70 01/22  112   112/68    70 01/21  112   109/68    69 01/20  124   114/72    80 01/19  138   106/74    69  Patient has a physical with Dr. Diona Browner on 04/30/2021 so I asked the patient to continue taking his blood pressure and blood glucose daily and bring his log to the appointment. Patient understood and agreed.  I reminded patient of his upcoming appointments: 04/25/2021 @ 2:00 pm for Annual Wellness Visit - Telephone Call 04/26/2021 @ 8:30 for lab work 04/30/2021 @ 9:20 for physical with Dr. Rene Paci, CPP notified  Marijean Niemann, [place] Assistant [phone]  I have reviewed the care management and care coordination activities outlined in this encounter and I am certifying that I agree with the content of this note. No further action required.  Debbora Dus, PharmD Clinical Pharmacist [facility] at [facility] [phone]

## 2021-04-23 NOTE — Telephone Encounter (Addendum)
BG and BP are wonderful. Great job monitoring.

## 2021-04-24 NOTE — Progress Notes (Signed)
Subjective:   Dominic Kane is a 81 y.o. male who presents for Medicare Annual/Subsequent preventive examination.  I connected with Jamie Brookes today by telephone and verified that I am speaking with the correct person using two identifiers. Location patient: home Location provider: work Persons participating in the virtual visit: patient, Marine scientist.    I discussed the limitations, risks, security and privacy concerns of performing an evaluation and management service by telephone and the availability of in person appointments. I also discussed with the patient that there may be a patient responsible charge related to this service. The patient expressed understanding and verbally consented to this telephonic visit.    Interactive audio and video telecommunications were attempted between this provider and patient, however failed, due to patient having technical difficulties OR patient did not have access to video capability.  We continued and completed visit with audio only.  Some vital signs may be absent or patient reported.   Time Spent with patient on telephone encounter: 25 minutes  Review of Systems     Cardiac Risk Factors include: advanced age (>54mn, >>37women);diabetes mellitus;hypertension     Objective:    Today's Vitals   04/25/21 1358  Weight: 205 lb (93 kg)  Height: 5' 11"  (1.803 m)   Body mass index is 28.59 kg/m.  Advanced Directives 04/25/2021 04/24/2020 02/16/2019 02/09/2018 01/05/2017 08/05/2016 07/30/2016  Does Patient Have a Medical Advance Directive? Yes Yes Yes Yes Yes - Yes  Type of Advance Directive [place] will [place]Living will [place]Living will [place]unting[place] StationLiving will HRio del MarLiving will HP[place]Living will  Does patient want to make changes to medical advance directive? Yes  (MAU/Ambulatory/Procedural Areas - Information given) - - - - - -  Copy of Healthcare Power of Attorney in Chart? - No - copy requested No - copy requested No - copy requested Yes Yes -    Current Medications (verified) Outpatient Encounter Medications as of 04/25/2021  Medication Sig   aspirin EC 81 MG tablet Take 81 mg by mouth daily.   Blood Glucose Monitoring Suppl (ONE TOUCH ULTRA 2) w/Device KIT Use to check blood sugar daily.   cetirizine (ZYRTEC) 10 MG tablet Take 10 mg by mouth daily.   finasteride (PROSCAR) 5 MG tablet Take 1 tablet (5 mg total) by mouth daily.   furosemide (LASIX) 40 MG tablet TAKE 1 TABLET BY MOUTH  DAILY (DUE FOR AN APPT IN  APRIL AND CAN GET YEARLY  SUPPLY AT THAT VISIT.   PLEASE CALL TO SCHEDULE)   levothyroxine (SYNTHROID) 150 MCG tablet TAKE 1 TABLET BY MOUTH DAILY BEFORE BREAKFAST.   Melatonin 5 MG TABS Take 5 mg by mouth at bedtime.   omeprazole (PRILOSEC) 40 MG capsule Take 1 capsule (40 mg total) by mouth daily.   OneTouch Delica Lancets 305LMISC Use to check blood sugar two times a day. Dx: E11.9   ONETOUCH ULTRA test strip USE TO CHECK BLOOD SUGAR EVERY OTHER DAY E11.9   PRESCRIPTION MEDICATION every 6 (six) months. *Antibiotic Injection in Eye*   ramipril (ALTACE) 2.5 MG capsule TAKE 1 CAPSULE BY MOUTH EVERY DAY   rosuvastatin (CRESTOR) 10 MG tablet TAKE 1 TABLET BY MOUTH  DAILY   terazosin (HYTRIN) 10 MG capsule TAKE 1 CAPSULE BY MOUTH EVERY DAY   Cholecalciferol (D3 ADULT PO) Take 125 mcg by mouth. Take 1 (5000 IU each) daily (Patient  not taking: Reported on 04/25/2021)   Multiple Vitamins-Minerals (PRESERVISION AREDS 2 PO) Take by mouth in the morning and at bedtime. (Patient not taking: Reported on 04/25/2021)   No facility-administered encounter medications on file as of 04/25/2021.    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 15 yrs ago x1   Hypertrophy of prostate without urinary obstruction and other lower urinary tract symptoms (LUTS)    Lipoprotein deficiencies    Other and unspecified hyperlipidemia    Other primary cardiomyopathies    Pain in joint, lower leg    Personal history of colonic polyps    Personal history of other diseases of digestive system    Pes anserinus tendinitis or bursitis    left shoulder remains an issue   Sinoatrial node dysfunction ([place])    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 [place] Crane   CATARACT EXTRACTION, BILATERAL     COLONOSCOPY W/ POLYPECTOMY     DOBUTAMINE STRESS ECHO  1/07   Lateral hypokinesis but no ischemia   ESOPHAGOGASTRODUODENOSCOPY  11/07   Schatzki's ring, non bleeding erosive gastropathy [place]   KNEE SURGERY Left 1947   LYMPH NODE DISSECTION N/A 08/05/2016   Procedure:  LIMITED LYMPH NODE DISSECTION;  Surgeon: Armandina Gemma, MD;  Location: [facility];  Service: General;  Laterality: N/A;   Pulmonary functioning tests  1. 2003  2. 2005   1. Diminished lung capacity  2. Stable   THYROIDECTOMY N/A 08/05/2016   Procedure: TOTAL THYROIDECTOMY;  Surgeon: Armandina Gemma, MD;  Location: [facility];  Service: General;  Laterality: N/A;   TONSILLECTOMY     TOTAL KNEE ARTHROPLASTY Left 03/18/2016   Procedure: LEFT TOTAL KNEE ARTHROPLASTY;  Surgeon: Paralee Cancel, MD;  Location: [facility];  Service: Orthopedics;  Laterality: Left;   Family History  Problem Relation Age of Onset   Heart failure Mother    Coronary artery disease Mother    Stroke Mother    Hypothyroidism Mother    Cancer Father        LUNG   Cancer Sister        BREAST   Heart failure Maternal Grandmother    Hypothyroidism Daughter  Colon cancer Neg Hx    Social History   Socioeconomic History   Marital status: Married    Spouse name: Not on file   Number of children: 1   Years of education: Not on file   Highest education level: Not on file  Occupational History   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  Tobacco Use   Smoking status: Former    Types: Cigarettes    Quit date: 07/03/1980    Years since quitting: 40.8   Smokeless tobacco: Never  Vaping Use   Vaping Use: Never used  Substance and Sexual Activity   Alcohol use: Yes    Alcohol/week: 0.0 standard drinks    Comment: occassionally   Drug use: No   Sexual activity: Not on file  Other Topics Concern   Not on file  Social History Narrative   Married 1 daughter   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   2 caffeinated drinks a day no alcohol tobacco or drug use    former smoker   Investment banker, operational of Radio broadcast assistant Strain: Low Risk    Difficulty of Paying Living Expenses: Not hard at all  Food Insecurity: No Food Insecurity   Worried About Charity fundraiser in  the Last Year: Never true   Arboriculturist in the Last Year: Never true  Transportation Needs: No Transportation Needs   Lack of Transportation (Medical): No   Lack of Transportation (Non-Medical): No  Physical Activity: Unknown   Days of Exercise per Week: 5 days   Minutes of Exercise per Session: Not on file  Stress: No Stress Concern Present   Feeling of Stress : Not at all  Social Connections: Moderately Integrated   Frequency of Communication with Friends and Family: More than three times a week   Frequency of Social Gatherings with Friends and Family: More than three times a week   Attends Religious Services: More than 4 times per year   Active Member of [employer] or Organizations: No   Attends Music therapist: Never   Marital Status: Married    Tobacco Counseling Counseling given: Not Answered   Clinical Intake:  Pre-visit preparation completed: Yes  Pain : No/denies pain     BMI - recorded: 28.59 Nutritional Status: BMI 25 -29 Overweight Nutritional Risks: None Diabetes: Yes CBG done?: No Did pt. bring in CBG monitor from home?: No  How often do you need to have someone help you when you read instructions, pamphlets, or other written materials from your doctor or pharmacy?: 1 - Never  Diabetes:  Is the patient diabetic?  Yes  If diabetic, was a CBG obtained today?  No  Did the patient bring in their glucometer from home?  No  How often do you monitor your CBG's? daily.   Financial Strains and Diabetes Management:  Are you having any financial strains with the device, your supplies or your medication? No .  Does the patient want to be seen by Chronic Care Management for management of their diabetes?  No  Would the patient like to be referred to a Nutritionist or for Diabetic Management?  No   Diabetic Exams:  Diabetic Eye Exam: Completed 07/30/20.   Diabetic Foot Exam: Completed 01/21/21.     Interpreter Needed?: No  Information entered by  :: Orrin Brigham LPN   Activities of Daily Living In your present state of health,  do you have any difficulty performing the following activities: 04/25/2021  Hearing? Y  Comment wears hearing aids  Vision? Y  Difficulty concentrating or making decisions? N  Walking or climbing stairs? N  Dressing or bathing? N  Doing errands, shopping? N  Preparing Food and eating ? N  Using the Toilet? N  In the past six months, have you accidently leaked urine? N  Do you have problems with loss of bowel control? N  Managing your Medications? N  Managing your Finances? N  Housekeeping or managing your Housekeeping? N  Some recent data might be hidden    Patient Care Team: Jinny Sanders, MD as PCP - General Burnell Blanks, MD as PCP - Cardiology (Cardiology) Debbor[facility] Dus[facility] as Pharmacist (Pharmacist)  Indicate any recent Medical Services you may have received from other than [facility] providers in the past year (date may be approximate).     Assessment:   This is a routine wellness examination for Latavius.  Hearing/Vision screen Hearing Screening - Comments:: Wears hearing aids Vision Screening - Comments:: Last exam 02/2021, [facility], wears glasses  Dietary issues and exercise activities discussed: Current Exercise Habits: The patient has a physically strenuous job, but has no regular exercise apart from work., Frequency (Times/Week): 5   Goals Addressed             This Visit's Progress    Patient Stated       Would like to maintain current routine       Depression Screen PHQ 2/9 Scores 04/25/2021 04/24/2020 03/30/2020 02/16/2019 02/09/2018 01/05/2017 10/19/2015  PHQ - 2 Score 0 0 0 0 0 0 0  PHQ- 9 Score - 0 - 0 0 0 -    Fall Risk Fall Risk  04/25/2021 04/24/2020 03/30/2020 02/16/2019 03/16/2018  Falls in the past year? 0 0 0 1 -  Comment - - - moving backwards and fell -  Number falls in past yr: 0 0 0 1 -  Comment - - - - -  Injury with Fall? 0 0 0 0 -   Risk for fall due to : Other (Comment) Medication side effect - Medication side effect History of fall(s);Impaired balance/gait  Risk for fall due to: Comment tripped - - - -  Follow up Falls prevention discussed Falls evaluation completed;Falls prevention discussed Falls evaluation completed Falls evaluation completed;Falls prevention discussed Falls evaluation completed;Education provided;Falls prevention discussed    FALL RISK PREVENTION PERTAINING TO THE HOME:  Any stairs in or around the home? Yes  If so, are there any without handrails? Yes  Home free of loose throw rugs in walkways, pet beds, electrical cords, etc? Yes  Adequate lighting in your home to reduce risk of falls? Yes   ASSISTIVE DEVICES UTILIZED TO PREVENT FALLS:  Life alert? No  Use of a cane, walker or w/c? No  Grab bars in the bathroom? Yes  Shower chair or bench in shower? No  Elevated toilet seat or a handicapped toilet? Yes   TIMED UP AND GO:  Was the test performed? No .    Cognitive Function: Normal cognitive status assessed by  this Nurse Health Advisor. No abnormalities found.   MMSE - Mini Mental State Exam 04/24/2020 02/16/2019 02/09/2018 01/05/2017  Not completed: Refused - - -  Orientation to time - 5 5 5   Orientation to Place - 5 5 5   Registration - 3 3 3   Attention/ Calculation - 3 0 0  Recall - 3 3  3  Language- name 2 objects - - 0 0  Language- repeat - 1 1 1   Language- follow 3 step command - - 3 3  Language- read & follow direction - - 0 0  Write a sentence - - 0 0  Copy design - - 0 0  Total score - - 20 20        Immunizations Immunization History  Administered Date(s) Administered   Fluad Quad(high Dose 65+) 11/17/2018   Influenza Whole 12/23/2006, 12/09/2007, 12/22/2008   Influenza, High Dose Seasonal PF 11/17/2014, 12/12/2015, 12/18/2016, 12/22/2017, 12/07/2019, 11/07/2020   Influenza,inj,Quad PF,6+ Mos 01/06/2013   Influenza-Unspecified 11/22/2013   Moderna Covid-19  Vaccine Bivalent Booster 78yr & up 01/22/2021   Moderna Sars-Covid-2 Vaccination 04/06/2019, 05/04/2019, 02/20/2020   PNEUMOCOCCAL CONJUGATE-20 11/07/2020   Pneumococcal Conjugate-13 10/12/2014   Pneumococcal Polysaccharide-23 12/23/2006   Td 03/24/2001   Tdap 11/17/2014   Zoster, Live 06/23/2014    TDAP status: Up to date  Flu Vaccine status: Up to date  Pneumococcal vaccine status: Up to date  Covid-19 vaccine status: Completed vaccines  Qualifies for Shingles Vaccine? Yes   Zostavax completed Yes   Shingrix Completed?: No.    Education has been provided regarding the importance of this vaccine. Patient has been advised to call insurance company to determine out of pocket expense if they have not yet received this vaccine. Advised may also receive vaccine at local pharmacy or Health Dept. Verbalized acceptance and understanding.  Screening Tests Health Maintenance  Topic Date Due   Zoster Vaccines- Shingrix (1 of 2) Never done   HEMOGLOBIN A1C  07/21/2021   OPHTHALMOLOGY EXAM  07/30/2021   FOOT EXAM  01/21/2022   TETANUS/TDAP  11/16/2024   Pneumonia Vaccine 81 Years old  Completed   INFLUENZA VACCINE  Completed   COVID-19 Vaccine  Completed   HPV VACCINES  Aged Out   COLONOSCOPY (Pts 45-463yrInsurance coverage will need to be confirmed)  Discontinued    Health Maintenance  Health Maintenance Due  Topic Date Due   Zoster Vaccines- Shingrix (1 of 2) Never done    Colorectal cancer screening: No longer required.   Lung Cancer Screening: (Low Dose CT Chest recommended if Age 81-80ears, 30 pack-year currently smoking OR have quit w/in 15years.) does not qualify.     Additional Screening:  Hepatitis C Screening: does not qualify  Vision Screening: Recommended annual ophthalmology exams for early detection of glaucoma and other disorders of the eye. Is the patient up to date with their annual eye exam?  Yes  Who is the provider or what is the name of the office  in which the patient attends annual eye exams? [facility], provider information unavailable   Dental Screening: Recommended annual dental exams for proper oral hygiene  Community Resource Referral / Chronic Care Management: CRR required this visit?  No   CCM required this visit?  No      Plan:     I have personally reviewed and noted the following in the patients chart:   Medical and social history Use of alcohol, tobacco or illicit drugs  Current medications and supplements including opioid prescriptions. Patient is not currently taking opioid prescriptions. Functional ability and status Nutritional status Physical activity Advanced directives List of other physicians Hospitalizations, surgeries, and ER visits in previous 12 months Vitals Screenings to include cognitive, depression, and falls Referrals and appointments  In addition, I have reviewed and discussed with patient certain preventive protocols, quality metrics, and  best practice recommendations. A written personalized care plan for preventive services as well as general preventive health recommendations were provided to patient.   Due to this being a telephonic visit, the after visit summary with patients personalized plan was offered to patient via mail or my-chart.  Patient would like to access on my-chart.   Loma Messing, LPN   08/26/3835   Nurse Health Advisor  Nurse Notes: none

## 2021-04-25 ENCOUNTER — Ambulatory Visit (INDEPENDENT_AMBULATORY_CARE_PROVIDER_SITE_OTHER): Payer: PPO

## 2021-04-25 VITALS — Ht 71.0 in | Wt 205.0 lb

## 2021-04-25 DIAGNOSIS — Z0000 Encounter for general adult medical examination without abnormal findings: Secondary | ICD-10-CM

## 2021-04-25 NOTE — Patient Instructions (Signed)
Dominic Kane , Thank you for taking time to complete your Medicare Wellness Visit. I appreciate your ongoing commitment to your health goals. Please review the following plan we discussed and let me know if I can assist you in the future.   Screening recommendations/referrals: Colonoscopy: no longer required  Recommended yearly ophthalmology/optometry visit for glaucoma screening and checkup Recommended yearly dental visit for hygiene and checkup  Vaccinations: Influenza vaccine: up to date  Pneumococcal vaccine: up to date  Tdap vaccine: up to date, completed 11/17/14, due 11/16/24 Shingles vaccine: Discuss with your local pharmacy   Covid-19: up to date   Advanced directives: Please bring a copy of Living Will and/or Healthcare Power of Attorney for your chart.   Conditions/risks identified: see problem list  Next appointment: Follow up in one year for your annual wellness visit. 04/28/22 @ 2:00pm, this will be a telephone visit   Preventive Care 81 Years and Older, Male Preventive care refers to lifestyle choices and visits with your health care provider that can promote health and wellness. What does preventive care include? A yearly physical exam. This is also called an annual well check. Dental exams once or twice a year. Routine eye exams. Ask your health care provider how often you should have your eyes checked. Personal lifestyle choices, including: Daily care of your teeth and gums. Regular physical activity. Eating a healthy diet. Avoiding tobacco and drug use. Limiting alcohol use. Practicing safe sex. Taking low doses of aspirin every day. Taking vitamin and mineral supplements as recommended by your health care provider. What happens during an annual well check? The services and screenings done by your health care provider during your annual well check will depend on your age, overall health, lifestyle risk factors, and family history of disease. Counseling  Your  health care provider may ask you questions about your: Alcohol use. Tobacco use. Drug use. Emotional well-being. Home and relationship well-being. Sexual activity. Eating habits. History of falls. Memory and ability to understand (cognition). Work and work Statistician. Screening  You may have the following tests or measurements: Height, weight, and BMI. Blood pressure. Lipid and cholesterol levels. These may be checked every 5 years, or more frequently if you are over 81 years old. Skin check. Lung cancer screening. You may have this screening every year starting at age 81 if you have a 30-pack-year history of smoking and currently smoke or have quit within the past 15 years. Fecal occult blood test (FOBT) of the stool. You may have this test every year starting at age 81. Flexible sigmoidoscopy or colonoscopy. You may have a sigmoidoscopy every 5 years or a colonoscopy every 10 years starting at age 81. Prostate cancer screening. Recommendations will vary depending on your family history and other risks. Hepatitis C blood test. Hepatitis B blood test. Sexually transmitted disease (STD) testing. Diabetes screening. This is done by checking your blood sugar (glucose) after you have not eaten for a while (fasting). You may have this done every 1-3 years. Abdominal aortic aneurysm (AAA) screening. You may need this if you are a current or former smoker. Osteoporosis. You may be screened starting at age 81 if you are at high risk. Talk with your health care provider about your test results, treatment options, and if necessary, the need for more tests. Vaccines  Your health care provider may recommend certain vaccines, such as: Influenza vaccine. This is recommended every year. Tetanus, diphtheria, and acellular pertussis (Tdap, Td) vaccine. You may need a Td booster  every 10 years. Zoster vaccine. You may need this after age 81. Pneumococcal 13-valent conjugate (PCV13) vaccine. One dose  is recommended after age 81. Pneumococcal polysaccharide (PPSV23) vaccine. One dose is recommended after age 81. Talk to your health care provider about which screenings and vaccines you need and how often you need them. This information is not intended to replace advice given to you by your health care provider. Make sure you discuss any questions you have with your health care provider. Document Released: 04/06/2015 Document Revised: 11/28/2015 Document Reviewed: 01/09/2015 Elsevier Interactive Patient Education  2017 [employer] Prevention in the Home Falls can cause injuries. They can happen to people of all ages. There are many things you can do to make your home safe and to help prevent falls. What can I do on the outside of my home? Regularly fix the edges of walkways and driveways and fix any cracks. Remove anything that might make you trip as you walk through a door, such as a raised step or threshold. Trim any bushes or trees on the path to your home. Use bright outdoor lighting. Clear any walking paths of anything that might make someone trip, such as rocks or tools. Regularly check to see if handrails are loose or broken. Make sure that both sides of any steps have handrails. Any raised decks and porches should have guardrails on the edges. Have any leaves, snow, or ice cleared regularly. Use sand or salt on walking paths during winter. Clean up any spills in your garage right away. This includes oil or grease spills. What can I do in the bathroom? Use night lights. Install grab bars by the toilet and in the tub and shower. Do not use towel bars as grab bars. Use non-skid mats or decals in the tub or shower. If you need to sit down in the shower, use a plastic, non-slip stool. Keep the floor dry. Clean up any water that spills on the floor as soon as it happens. Remove soap buildup in the tub or shower regularly. Attach bath mats securely with double-sided non-slip rug  tape. Do not have throw rugs and other things on the floor that can make you trip. What can I do in the bedroom? Use night lights. Make sure that you have a light by your bed that is easy to reach. Do not use any sheets or blankets that are too big for your bed. They should not hang down onto the floor. Have a firm chair that has side arms. You can use this for support while you get dressed. Do not have throw rugs and other things on the floor that can make you trip. What can I do in the kitchen? Clean up any spills right away. Avoid walking on wet floors. Keep items that you use a lot in easy-to-reach places. If you need to reach something above you, use a strong step stool that has a grab bar. Keep electrical cords out of the way. Do not use floor polish or wax that makes floors slippery. If you must use wax, use non-skid floor wax. Do not have throw rugs and other things on the floor that can make you trip. What can I do with my stairs? Do not leave any items on the stairs. Make sure that there are handrails on both sides of the stairs and use them. Fix handrails that are broken or loose. Make sure that handrails are as long as the stairways. Check any carpeting to  make sure that it is firmly attached to the stairs. Fix any carpet that is loose or worn. Avoid having throw rugs at the top or bottom of the stairs. If you do have throw rugs, attach them to the floor with carpet tape. Make sure that you have a light switch at the top of the stairs and the bottom of the stairs. If you do not have them, ask someone to add them for you. What else can I do to help prevent falls? Wear shoes that: Do not have high heels. Have rubber bottoms. Are comfortable and fit you well. Are closed at the toe. Do not wear sandals. If you use a stepladder: Make sure that it is fully opened. Do not climb a closed stepladder. Make sure that both sides of the stepladder are locked into place. Ask someone to  hold it for you, if possible. Clearly mark and make sure that you can see: Any grab bars or handrails. First and last steps. Where the edge of each step is. Use tools that help you move around (mobility aids) if they are needed. These include: Canes. Walkers. Scooters. Crutches. Turn on the lights when you go into a dark area. Replace any light bulbs as soon as they burn out. Set up your furniture so you have a clear path. Avoid moving your furniture around. If any of your floors are uneven, fix them. If there are any pets around you, be aware of where they are. Review your medicines with your doctor. Some medicines can make you feel dizzy. This can increase your chance of falling. Ask your doctor what other things that you can do to help prevent falls. This information is not intended to replace advice given to you by your health care provider. Make sure you discuss any questions you have with your health care provider. Document Released: 01/04/2009 Document Revised: 08/16/2015 Document Reviewed: 04/14/2014 Elsevier Interactive Patient Education  2017 [employer].

## 2021-04-25 NOTE — Progress Notes (Signed)
I reviewed health advisor's note, was available for consultation, and agree with documentation and plan.  

## 2021-04-26 ENCOUNTER — Other Ambulatory Visit: Payer: Self-pay

## 2021-04-26 ENCOUNTER — Other Ambulatory Visit (INDEPENDENT_AMBULATORY_CARE_PROVIDER_SITE_OTHER): Payer: PPO

## 2021-04-26 DIAGNOSIS — E11319 Type 2 diabetes mellitus with unspecified diabetic retinopathy without macular edema: Secondary | ICD-10-CM | POA: Diagnosis not present

## 2021-04-26 DIAGNOSIS — E559 Vitamin D deficiency, unspecified: Secondary | ICD-10-CM | POA: Diagnosis not present

## 2021-04-26 DIAGNOSIS — D485 Neoplasm of uncertain behavior of skin: Secondary | ICD-10-CM | POA: Diagnosis not present

## 2021-04-26 DIAGNOSIS — L98499 Non-pressure chronic ulcer of skin of other sites with unspecified severity: Secondary | ICD-10-CM | POA: Diagnosis not present

## 2021-04-26 LAB — HEMOGLOBIN A1C: Hgb A1c MFr Bld: 6.5 % (ref 4.6–6.5)

## 2021-04-26 LAB — COMPREHENSIVE METABOLIC PANEL
ALT: 13 U/L (ref 0–53)
AST: 19 U/L (ref 0–37)
Albumin: 4.4 g/dL (ref 3.5–5.2)
Alkaline Phosphatase: 80 U/L (ref 39–117)
BUN: 19 mg/dL (ref 6–23)
CO2: 32 mEq/L (ref 19–32)
Calcium: 9.6 mg/dL (ref 8.4–10.5)
Chloride: 103 mEq/L (ref 96–112)
Creatinine, Ser: 0.93 mg/dL (ref 0.40–1.50)
GFR: 77.67 mL/min (ref >60.00)
Glucose, Bld: 96 mg/dL (ref 70–99)
Potassium: 4.6 mEq/L (ref 3.5–5.1)
Sodium: 140 mEq/L (ref 135–145)
Total Bilirubin: 1.2 mg/dL (ref 0.2–1.2)
Total Protein: 7 g/dL (ref 6.0–8.3)

## 2021-04-26 LAB — LIPID PANEL
Cholesterol: 121 mg/dL (ref 0–200)
HDL: 42.1 mg/dL (ref >39.00)
LDL Cholesterol: 67 mg/dL (ref 0–99)
NonHDL: 79.15
Total CHOL/HDL Ratio: 3
Triglycerides: 61 mg/dL (ref 0.0–149.0)
VLDL: 12.2 mg/dL (ref 0.0–40.0)

## 2021-04-26 LAB — VITAMIN D 25 HYDROXY (VIT D DEFICIENCY, FRACTURES): VITD: 52.93 ng/mL (ref 30.00–100.00)

## 2021-04-26 NOTE — Progress Notes (Signed)
No critical labs need to be addressed urgently. We will discuss labs in detail at upcoming office visit.   

## 2021-04-30 ENCOUNTER — Encounter: Payer: Self-pay | Admitting: Family Medicine

## 2021-04-30 ENCOUNTER — Ambulatory Visit (INDEPENDENT_AMBULATORY_CARE_PROVIDER_SITE_OTHER): Payer: PPO | Admitting: Family Medicine

## 2021-04-30 ENCOUNTER — Telehealth: Payer: Self-pay

## 2021-04-30 ENCOUNTER — Encounter: Payer: Medicare Other | Admitting: Family Medicine

## 2021-04-30 ENCOUNTER — Other Ambulatory Visit: Payer: Self-pay

## 2021-04-30 VITALS — BP 114/70 | HR 61 | Temp 98.4°F | Ht 70.0 in | Wt 206.2 lb

## 2021-04-30 DIAGNOSIS — E1169 Type 2 diabetes mellitus with other specified complication: Secondary | ICD-10-CM

## 2021-04-30 DIAGNOSIS — Z0001 Encounter for general adult medical examination with abnormal findings: Secondary | ICD-10-CM | POA: Diagnosis not present

## 2021-04-30 DIAGNOSIS — E559 Vitamin D deficiency, unspecified: Secondary | ICD-10-CM

## 2021-04-30 DIAGNOSIS — I509 Heart failure, unspecified: Secondary | ICD-10-CM | POA: Diagnosis not present

## 2021-04-30 DIAGNOSIS — E1159 Type 2 diabetes mellitus with other circulatory complications: Secondary | ICD-10-CM | POA: Diagnosis not present

## 2021-04-30 DIAGNOSIS — E1140 Type 2 diabetes mellitus with diabetic neuropathy, unspecified: Secondary | ICD-10-CM

## 2021-04-30 DIAGNOSIS — E785 Hyperlipidemia, unspecified: Secondary | ICD-10-CM | POA: Diagnosis not present

## 2021-04-30 DIAGNOSIS — E11311 Type 2 diabetes mellitus with unspecified diabetic retinopathy with macular edema: Secondary | ICD-10-CM | POA: Diagnosis not present

## 2021-04-30 DIAGNOSIS — C73 Malignant neoplasm of thyroid gland: Secondary | ICD-10-CM

## 2021-04-30 DIAGNOSIS — R509 Fever, unspecified: Secondary | ICD-10-CM | POA: Diagnosis not present

## 2021-04-30 DIAGNOSIS — E1340 Other specified diabetes mellitus with diabetic neuropathy, unspecified: Secondary | ICD-10-CM

## 2021-04-30 DIAGNOSIS — Z0000 Encounter for general adult medical examination without abnormal findings: Secondary | ICD-10-CM

## 2021-04-30 DIAGNOSIS — I152 Hypertension secondary to endocrine disorders: Secondary | ICD-10-CM | POA: Diagnosis not present

## 2021-04-30 LAB — POC INFLUENZA A&B (BINAX/QUICKVUE)
Influenza A, POC: NEGATIVE
Influenza B, POC: NEGATIVE

## 2021-04-30 LAB — POC COVID19 BINAXNOW: SARS Coronavirus 2 Ag: NEGATIVE

## 2021-04-30 MED ORDER — AMOXICILLIN 500 MG PO CAPS: 1000.0000 mg | ORAL_CAPSULE | Freq: Two times a day (BID) | ORAL | 0 refills | Status: DC

## 2021-04-30 NOTE — Telephone Encounter (Addendum)
[facility] Night - Client Nonclinical Telephone Record  [facility] Client [facility] Night - Client Client Site [place] - Night Provider Eliezer Lofts - MD Contact Type Call Who Is Calling Patient / Member / Family / Caregiver Caller Name [place] Phone Number [phone] Patient Name Jarrod Mcenery Patient DOB Aug 20, 1940 Call Type Message Only Information Provided Reason for Call Request to Reschedule Office Appointment Initial Comment Caller states he has an appointment in the morning that he cancelled but he would like to keep it. The appointment was at [place]. Patient request to speak to RN No Additional Comment Provided office hours. Disp. Time Disposition Final User 04/29/2021 6:30:35 PM General Information Provided Yes Benavides, Arianna Call Closed By: Hadassah Pais Transaction Date/Time: 04/29/2021 6:27:32 PM (ET    [place] Night - Client Nonclinical Telephone Record  [facility] Client [facility] Night - Client Client Site [place] Provider Eliezer Lofts - MD Contact Type Call Who Is Calling Patient / Member / Family / Caregiver Caller Name ARIK HUSMANN Caller Phone Number [phone] Patient Name RAIJON LINDFORS Patient DOB Dec 19, 1940 Call Type Message Only Information Provided Reason for Call Request to Schedule Office Appointment Initial Comment Caller states her husband started running a fever of 100.4. Patient request to speak to RN No Additional Comment Provided office hours. Caller declined triage. Disp. Time Disposition Final User 04/30/2021 8:01:23 AM General Information Provided Yes Silvestre Moment Call Closed By: Silvestre Moment Transaction Date/Time: 04/30/2021 7:54:42 AM (ET

## 2021-04-30 NOTE — Progress Notes (Signed)
Patient ID: Dominic Kane, male    DOB: 1940-05-18, 81 y.o.   MRN: [id number]  This visit was conducted in person.  BP 114/70    Pulse 61    Temp 98.4 F (36.9 C) (Temporal)    Ht 5' 10"  (1.778 m)    Wt 206 lb 4 oz (93.6 kg)    SpO2 94%    BMI 29.59 kg/m    CC:  Chief Complaint  Patient presents with   Annual Exam    Part 2    Subjective:   HPI: Dominic Kane is a 81 y.o. male presenting on 04/30/2021 for Annual Exam (Part 2)  The patient presents for  complete physical and review of chronic health problems. He/She also has the following acute concerns today: sinus congestion   Date of  onset 2 days ago... started with nasal congestion. Fever 102.  He has been feeling off balance.  No ear pain, cough, dry. Pain in bilateral face over sinus.. better with tylenol but not gone. Mild ST. No change in SOB, mild.  Some body aches. He feels very tired today.  Neg COVID home test yesterday.  No sick contacts.  The patient saw a LPN or RN for medicare wellness visit.  Prevention and wellness was reviewed in detail. Note reviewed and important notes copied below.  Diabetes:  Diet controlled ... slight worse likely due to no exercise... given colder Lab Results  Component Value Date   HGBA1C 6.5 04/26/2021  Using medications without difficulties: Hypoglycemic episodes:none Hyperglycemic episodes: none Feet problems: no ulcers Blood Sugars averaging:  FBS 99-120 eye exam within last year: yes  Associated with neuropathy and retinopathy  Hypertension:    [place] control on low dose rampiril BP Readings from Last 3 Encounters:  04/30/21 114/70  04/04/21 122/82  01/23/21 106/74  Using medication without problems or lightheadedness:  occ Chest pain with exertion: none Edema: none Short of breath: mild Average home BPs: 116-122/76-87 Other issues:  Elevated Cholesterol:  LD at goal  < 70 on Crestor 10 mg daily Lab Results  Component Value Date   CHOL 121 04/26/2021    HDL 42.10 04/26/2021   LDLCALC 67 04/26/2021   TRIG 61.0 04/26/2021   CHOLHDL 3 04/26/2021  Using medications without problems: Muscle aches:  Diet compliance: moderate Exercise: none Other complaints:  Cardiomyopathy and CHF followed by cardiology  Hypothyroid and papillary thyroid care followed by Dr. Loanne Drilling  Relevant past medical, surgical, family and social history reviewed and updated as indicated. Interim medical history since our last visit reviewed. Allergies and medications reviewed and updated. Outpatient Medications Prior to Visit  Medication Sig Dispense Refill   aspirin EC 81 MG tablet Take 81 mg by mouth daily.     Blood Glucose Monitoring Suppl (ONE TOUCH ULTRA 2) w/Device KIT Use to check blood sugar daily. 1 each 0   cetirizine (ZYRTEC) 10 MG tablet Take 10 mg by mouth daily.     Cholecalciferol (D3 ADULT PO) Take 125 mcg by mouth. Take 1 (5000 IU each) daily     finasteride (PROSCAR) 5 MG tablet Take 1 tablet (5 mg total) by mouth daily. 90 tablet 3   furosemide (LASIX) 40 MG tablet TAKE 1 TABLET BY MOUTH  DAILY (DUE FOR AN APPT IN  APRIL AND CAN GET YEARLY  SUPPLY AT THAT VISIT.   PLEASE CALL TO SCHEDULE) 90 tablet 3   levothyroxine (SYNTHROID) 150 MCG tablet TAKE 1 TABLET BY MOUTH DAILY  BEFORE BREAKFAST. 90 tablet 3   Melatonin 5 MG TABS Take 5 mg by mouth at bedtime.     Multiple Vitamins-Minerals (PRESERVISION AREDS 2 PO) Take by mouth in the morning and at bedtime.     omeprazole (PRILOSEC) 20 MG capsule Take 20 mg by mouth daily.     OneTouch Delica Lancets 38B MISC Use to check blood sugar two times a day. Dx: E11.9 200 each 3   ONETOUCH ULTRA test strip USE TO CHECK BLOOD SUGAR EVERY OTHER DAY E11.9 50 strip 3   PRESCRIPTION MEDICATION every 6 (six) months. *Antibiotic Injection in Eye*     ramipril (ALTACE) 2.5 MG capsule TAKE 1 CAPSULE BY MOUTH EVERY DAY 90 capsule 3   rosuvastatin (CRESTOR) 10 MG tablet TAKE 1 TABLET BY MOUTH  DAILY 90 tablet 3    terazosin (HYTRIN) 10 MG capsule TAKE 1 CAPSULE BY MOUTH EVERY DAY 90 capsule 0   omeprazole (PRILOSEC) 40 MG capsule Take 1 capsule (40 mg total) by mouth daily. 90 capsule 3   No facility-administered medications prior to visit.     Per HPI unless specifically indicated in ROS section below Review of Systems  Constitutional:  Positive for fever. Negative for fatigue.  HENT:  Positive for sinus pressure and sore throat. Negative for ear pain.   Eyes:  Negative for pain.  Respiratory:  Positive for cough. Negative for shortness of breath.   Cardiovascular:  Negative for chest pain, palpitations and leg swelling.  Gastrointestinal:  Negative for abdominal pain.  Genitourinary:  Negative for dysuria.  Musculoskeletal:  Negative for arthralgias.  Neurological:  Negative for syncope, light-headedness and headaches.  Psychiatric/Behavioral:  Negative for dysphoric mood.   Objective:  BP 114/70    Pulse 61    Temp 98.4 F (36.9 C) (Temporal)    Ht 5' 10"  (1.778 m)    Wt 206 lb 4 oz (93.6 kg)    SpO2 94%    BMI 29.59 kg/m   Wt Readings from Last 3 Encounters:  04/30/21 206 lb 4 oz (93.6 kg)  04/25/21 205 lb (93 kg)  04/04/21 205 lb (93 kg)      Physical Exam Constitutional:      Appearance: He is well-developed. He is not ill-appearing or toxic-appearing.  HENT:     Head: Normocephalic.     Right Ear: Hearing normal.     Left Ear: Hearing normal.     Nose: Congestion present.     Mouth/Throat:     Pharynx: No oropharyngeal exudate or posterior oropharyngeal erythema.  Eyes:     Conjunctiva/sclera: Conjunctivae normal.  Neck:     Thyroid: No thyroid mass or thyromegaly.     Vascular: No carotid bruit.     Trachea: Trachea normal.  Cardiovascular:     Rate and Rhythm: Normal rate and regular rhythm.     Pulses: Normal pulses.     Heart sounds: Heart sounds not distant. No murmur heard.   No friction rub. No gallop.     Comments: No peripheral edema Pulmonary:     Effort:  Pulmonary effort is normal. No respiratory distress.     Breath sounds: Normal breath sounds.  Skin:    General: Skin is warm and dry.     Findings: No rash.  Psychiatric:        Speech: Speech normal.        Behavior: Behavior normal.        Thought Content: Thought content normal.  Results for orders placed or performed in visit on 04/26/21  VITAMIN D 25 Hydroxy (Vit-D Deficiency, Fractures)  Result Value Ref Range   VITD 52.93 30.00 - 100.00 ng/mL  Comprehensive metabolic panel  Result Value Ref Range   Sodium 140 135 - 145 mEq/L   Potassium 4.6 3.5 - 5.1 mEq/L   Chloride 103 96 - 112 mEq/L   CO2 32 19 - 32 mEq/L   Glucose, Bld 96 70 - 99 mg/dL   BUN 19 6 - 23 mg/dL   Creatinine, Ser 0.93 0.40 - 1.50 mg/dL   Total Bilirubin 1.2 0.2 - 1.2 mg/dL   Alkaline Phosphatase 80 39 - 117 U/L   AST 19 0 - 37 U/L   ALT 13 0 - 53 U/L   Total Protein 7.0 6.0 - 8.3 g/dL   Albumin 4.4 3.5 - 5.2 g/dL   GFR 77.67 >60.00 mL/min   Calcium 9.6 8.4 - 10.5 mg/dL  Lipid panel  Result Value Ref Range   Cholesterol 121 0 - 200 mg/dL   Triglycerides 61.0 0.0 - 149.0 mg/dL   HDL 42.10 >39.00 mg/dL   VLDL 12.2 0.0 - 40.0 mg/dL   LDL Cholesterol 67 0 - 99 mg/dL   Total CHOL/HDL Ratio 3    NonHDL 79.15   Hemoglobin A1c  Result Value Ref Range   Hgb A1c MFr Bld 6.5 4.6 - 6.5 %    This visit occurred during the SARS-CoV-2 public health emergency.  Safety protocols were in place, including screening questions prior to the visit, additional usage of staff PPE, and extensive cleaning of exam room while observing appropriate contact time as indicated for disinfecting solutions.   COVID 19 screen:  No recent travel or known exposure to COVID19 The patient denies respiratory symptoms of COVID 19 at this time. The importance of social distancing was discussed today.   Assessment and Plan The patient's preventative maintenance and recommended screening tests for an annual wellness exam were  reviewed in full today. Brought up to date unless services declined.  Counselled on the importance of diet, exercise, and its role in overall health and mortality. The patient's FH and SH was reviewed, including their home life, tobacco status, and drug and alcohol status.   Vaccines:Uptodate, COVID x 4. Consider Shingrix. 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 Carlean Purl, no followed up indicated Former smoker 20 pipe, quit 40 years ago.  Prostate eval not indicated.   Problem List Items Addressed This Visit     CHF (NYHA class II, ACC/AHA stage C) (HCC)    Euvolemic on lasix.  On ACEI.      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       Fever    Acute  No sick contacts.  Neg flu and COVID rapid tests today in office.  Denies dysuria, GI change, skin rash.  Most likely source of infection is viral vs bacterial sinusitis.[name] will treat with amox x 10 days given fever.  he will call with new symptoms and if fever not resolving.      Relevant Orders   POC Influenza A&B (Binax test) (Completed)   POC COVID-19 (Completed)   Hyperlipidemia associated with type 2 diabetes mellitus (HCC)    Stable, chronic.  Continue current medication.   Crestor 10 mg daily      Hypertension associated with diabetes (HCC)    Stable, chronic.  Continue current medication.   On ramipril low dose.  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      Papillary thyroid carcinoma ([place])     Followed by ENDO.       Relevant Medications   amoxicillin (AMOXIL) 500 MG capsule   Vitamin D deficiency    Resolved on supplement.      Other Visit Diagnoses     Routine general medical examination at a health care facility    -  Primary      Meds ordered this encounter  Medications   amoxicillin (AMOXIL) 500 MG capsule    Sig: Take 2 capsules (1,000 mg total) by mouth 2 (two) times daily.    Dispense:  40 capsule     Refill:  0   Orders Placed This Encounter  Procedures   POC Influenza A&B (Binax test)   POC COVID-19    Order Specific Question:   Previously tested for COVID-19    Answer:   Yes    Order Specific Question:   Resident in a congregate (group) care setting    Answer:   No    Order Specific Question:   Employed in healthcare setting    Answer:   No       Eliezer Lofts, MD

## 2021-04-30 NOTE — Assessment & Plan Note (Signed)
Stable, chronic.  Continue current medication.   On ramipril low dose.

## 2021-04-30 NOTE — Telephone Encounter (Addendum)
Pt has already called back and scheduled appt 04/30/21 at 9:20 with Dr Diona Browner. Sending FYI to Dr Diona Browner and Butch Penny CMA.

## 2021-04-30 NOTE — Assessment & Plan Note (Signed)
Euvolemic on lasix.  On ACEI.

## 2021-04-30 NOTE — Assessment & Plan Note (Addendum)
Due to DM, minimally symptomatic.

## 2021-04-30 NOTE — Assessment & Plan Note (Signed)
Followed by ENDO 

## 2021-04-30 NOTE — Assessment & Plan Note (Signed)
Chronic, diet controlled. 

## 2021-04-30 NOTE — Patient Instructions (Addendum)
Get back to walking as you tolerated.  Consider Shingrix at pharmacy.  Complete Amoxicillin for  presumed sinus infection.. call if fever persisting  or new symptoms start.  Continue Flonase 2 sprays per nostril daily.  Use tylenol for pain and fever.

## 2021-04-30 NOTE — Assessment & Plan Note (Signed)
Stable, chronic.  Continue current medication.   Crestor 10 mg daily 

## 2021-04-30 NOTE — Assessment & Plan Note (Signed)
Good DM control, followed by opthalmology.

## 2021-04-30 NOTE — Assessment & Plan Note (Signed)
Resolved on supplement 

## 2021-04-30 NOTE — Assessment & Plan Note (Signed)
Acute  No sick contacts.  Neg flu and COVID rapid tests today in office.  Denies dysuria, GI change, skin rash.  Most likely source of infection is viral vs bacterial sinusitis.Dominic Kane will treat with amox x 10 days given fever.  he will call with new symptoms and if fever not resolving.

## 2021-05-03 ENCOUNTER — Ambulatory Visit: Payer: Medicare Other | Admitting: Cardiovascular Disease

## 2021-05-15 ENCOUNTER — Other Ambulatory Visit: Payer: Self-pay | Admitting: Cardiovascular Disease

## 2021-05-29 ENCOUNTER — Other Ambulatory Visit: Payer: Self-pay | Admitting: *Deleted

## 2021-05-29 MED ORDER — FINASTERIDE 5 MG PO TABS: 5.0000 mg | ORAL_TABLET | Freq: Every day | ORAL | 3 refills | Status: DC

## 2021-06-03 ENCOUNTER — Telehealth: Payer: Self-pay

## 2021-06-03 ENCOUNTER — Other Ambulatory Visit: Payer: Self-pay

## 2021-06-03 ENCOUNTER — Other Ambulatory Visit: Payer: Self-pay | Admitting: Endocrinology

## 2021-06-03 ENCOUNTER — Other Ambulatory Visit: Payer: Self-pay | Admitting: Family Medicine

## 2021-06-03 ENCOUNTER — Encounter (INDEPENDENT_AMBULATORY_CARE_PROVIDER_SITE_OTHER): Payer: PPO | Admitting: Ophthalmology

## 2021-06-03 DIAGNOSIS — H35033 Hypertensive retinopathy, bilateral: Secondary | ICD-10-CM

## 2021-06-03 DIAGNOSIS — E042 Nontoxic multinodular goiter: Secondary | ICD-10-CM

## 2021-06-03 DIAGNOSIS — H43813 Vitreous degeneration, bilateral: Secondary | ICD-10-CM | POA: Diagnosis not present

## 2021-06-03 DIAGNOSIS — I10 Essential (primary) hypertension: Secondary | ICD-10-CM

## 2021-06-03 DIAGNOSIS — H353112 Nonexudative age-related macular degeneration, right eye, intermediate dry stage: Secondary | ICD-10-CM | POA: Diagnosis not present

## 2021-06-03 DIAGNOSIS — H33301 Unspecified retinal break, right eye: Secondary | ICD-10-CM | POA: Diagnosis not present

## 2021-06-03 DIAGNOSIS — H34811 Central retinal vein occlusion, right eye, with macular edema: Secondary | ICD-10-CM | POA: Diagnosis not present

## 2021-06-03 DIAGNOSIS — H35372 Puckering of macula, left eye: Secondary | ICD-10-CM

## 2021-06-03 LAB — HM DIABETES EYE EXAM

## 2021-06-03 NOTE — Progress Notes (Signed)
? ? ?  Chronic Care Management ?Pharmacy Assistant  ? ?Name: [name]  MRN: [id number] DOB: 16-Feb-1941 ? ?Transition CCM to Self Care ? ?Patient contacted to inform they have achieved their CCM goals and no longer need to be contacted as frequently. Patient advised services will still be available to them if they would like to reach out or have any new health concerns. Verified patient had contact information to pharmacist and health concierge on hand. Patient made aware CCM services would be continued if desired. Patient consented to cancel future CCM appointments. ? ?Charlene Brooke, CPP notified ? ?Marijean Niemann, RMA ?Clinical Pharmacy Assistant ?[phone] ? ?

## 2021-06-12 ENCOUNTER — Encounter: Payer: Self-pay | Admitting: Family Medicine

## 2021-06-25 ENCOUNTER — Other Ambulatory Visit: Payer: Self-pay

## 2021-06-25 DIAGNOSIS — E042 Nontoxic multinodular goiter: Secondary | ICD-10-CM

## 2021-06-25 MED ORDER — LEVOTHYROXINE SODIUM 150 MCG PO TABS: ORAL_TABLET | ORAL | 3 refills | Status: DC

## 2021-07-15 ENCOUNTER — Telehealth: Payer: Self-pay | Admitting: Family Medicine

## 2021-07-15 NOTE — Telephone Encounter (Signed)
Pt called stating he got a call from the office but did not recall who left a voicemail. Would like to speak with the nurse and see what the message was for.  ?

## 2021-07-16 NOTE — Telephone Encounter (Signed)
Reviewed patients chart and do not see where anyone from our office has called patient recently. LMTCB to let patient know we did not call him. ?

## 2021-07-22 ENCOUNTER — Encounter: Payer: Self-pay | Admitting: Endocrinology

## 2021-07-22 ENCOUNTER — Ambulatory Visit: Payer: PPO | Admitting: Endocrinology

## 2021-07-22 VITALS — BP 140/78 | HR 66 | Ht 70.0 in | Wt 208.2 lb

## 2021-07-22 DIAGNOSIS — E890 Postprocedural hypothyroidism: Secondary | ICD-10-CM

## 2021-07-22 DIAGNOSIS — C73 Malignant neoplasm of thyroid gland: Secondary | ICD-10-CM

## 2021-07-22 DIAGNOSIS — E11319 Type 2 diabetes mellitus with unspecified diabetic retinopathy without macular edema: Secondary | ICD-10-CM | POA: Diagnosis not present

## 2021-07-22 LAB — POCT GLYCOSYLATED HEMOGLOBIN (HGB A1C): Hemoglobin A1C: 6.1 % — AB (ref 4.0–5.6)

## 2021-07-22 LAB — T4, FREE: Free T4: 0.94 ng/dL (ref 0.60–1.60)

## 2021-07-22 LAB — TSH: TSH: 4.48 u[IU]/mL (ref 0.35–5.50)

## 2021-07-22 NOTE — Progress Notes (Signed)
? ?Subjective:  ? ? Patient ID: Dominic Kane, male    DOB: [birth year], 81 y.o.   MRN: [id number] ? 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   ?Therapy: no medication now ?DKA: never ?Severe hypoglycemia: never ?Pancreatitis: never ?Pancreatic imaging: CT (2014): scattered parenchymal calcification.   ?Other: he has never taken insulin.   ?Interval history: pt states he feels well in general. No recent steroids. ?Pt has stage 2 PTC.   ?4/18: thyroidectomy:  pT3b, pN0 ?6/18: RAI, on synthroid, 122 mCi, stimulated by thyrogen.   ?6/18: post-therapy scan pos at thyroid bed only.  ?10/18: TG is undetectable (ab pos).  ?4/19: TG is undetectable (ab pos, but lower titer).   ?5/20: TG= 0.3 (ab neg).   ?10/21 TG= 0.3 (ab neg) ?4/22 TG= 0.3 (ab neg) ?10/22 TG= 0.1 (ab neg) ?He does not notice any neck nodule or pain.  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   ?Past Medical History:  ?Diagnosis Date  ? Abnormality of gait   ? Acute sinusitis, unspecified   ? Allergy   ? 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 Aortic valve disorders   ? Arthritis   ? Osteoarthritis-bilateral knees, lower back issues occasionaly related to knee issues  ? Benign paroxysmal positional vertigo   ? not in a long time  ? Cataract   ? resolved  ? CHF (congestive heart failure) ([place])   ? Coronary atherosclerosis of native coronary artery   ? Diabetes mellitus without complication ([place])   ? Diet control only.  ? Displacement of lumbar intervertebral disc without myelopathy   ? Diverticulosis of colon (without mention of hemorrhage)   ? Dysfunction of eustachian tube   ? Hard of hearing"bilateral hearing aids"  ? Enlarged prostate   ? GERD (gastroesophageal reflux disease)   ? Headache(784.0)   ? History of kidney stones   ? past hx. 15 yrs ago x1  ? Hypertrophy  of prostate without urinary obstruction and other lower urinary tract symptoms (LUTS)   ? Lipoprotein deficiencies   ? Other and unspecified hyperlipidemia   ? Other primary cardiomyopathies   ? Pain in joint, lower leg   ? Personal history of colonic polyps   ? Personal history of other diseases of digestive system   ? Pes anserinus tendinitis or bursitis   ? left shoulder remains an issue  ? Sinoatrial node dysfunction (HCC)   ? [name] follows  ? 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 Thoracic or lumbosacral neuritis or radiculitis, unspecified   ? Unspecified disorder of skin and subcutaneous tissue   ? Unspecified essential hypertension   ? Unspecified vitamin D deficiency   ? ? ?Past Surgical History:  ?Procedure Laterality Date  ? BASAL CELL CARCINOMA EXCISION  11/2016  ? Dr. Nevada Crane  ? CATARACT EXTRACTION, BILATERAL    ? COLONOSCOPY W/ POLYPECTOMY    ? DOBUTAMINE STRESS ECHO  1/07  ? Lateral hypokinesis but no ischemia  ? ESOPHAGOGASTRODUODENOSCOPY  11/07  ? Schatzki's ring, non bleeding erosive gastropathy [place]  ? KNEE SURGERY Left 1947  ? LYMPH NODE DISSECTION N/A 08/05/2016  ? Procedure: LIMITED LYMPH NODE DISSECTION;  Surgeon: Armandina Gemma, MD;  Location: [facility];  Service: General;  Laterality: N/A;  ? Pulmonary functioning tests  1. 2003  2.  2005  ? 1. Diminished lung capacity  2. Stable  ? THYROIDECTOMY N/A 08/05/2016  ? Procedure: TOTAL THYROIDECTOMY;  Surgeon: Armandina Gemma, MD;  Location: [facility];  Service: General;  Laterality: N/A;  ? TONSILLECTOMY    ? TOTAL KNEE ARTHROPLASTY Left 03/18/2016  ? Procedure: LEFT TOTAL KNEE ARTHROPLASTY;  Surgeon: Paralee Cancel, MD;  Location: [facility];  Service: Orthopedics;  Laterality: Left;  ? ? ?Social History  ? ?Socioeconomic History  ? Marital status: Married  ?  Spouse name: Not on file  ? Number of children: 1  ? Years of education: Not on file  ? Highest education level: Not on file  ?Occupational History  ? Occupation: retired Teacher, English as a foreign language   ?  Employer: RETIRED  ?Tobacco Use  ? Smoking status: Former  ?  Types: Cigarettes  ?  Quit date: 07/03/1980  ?  Years since quitting: 41.0  ? Smokeless tobacco: Never  ?Vaping Use  ? Vaping Use: Never used  ?Substance and Sexual Activity  ? Alcohol use: Yes  ?  Alcohol/week: 0.0 standard drinks  ?  Comment: occassionally  ? Drug use: No  ? Sexual activity: Not on file  ?Other Topics Concern  ? Not on file  ?Social History Narrative  ? Married 1 daughter  ? 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 2 caffeinated drinks a day no alcohol tobacco or drug use  ?  former smoker  ? ?Social Determinants of Health  ? ?Financial Resource Strain: Low Risk   ? Difficulty of Paying Living Expenses: Not hard at all  ?Food Insecurity: No Food Insecurity  ? Worried About Charity fundraiser in the Last Year: Never true  ? Ran Out of Food in the Last Year: Never true  ?Transportation Needs: No Transportation Needs  ? Lack of Transportation (Medical): No  ? Lack of Transportation (Non-Medical): No  ?Physical Activity: Unknown  ? Days of Exercise per Week: 5 days  ? Minutes of Exercise per Session: Not on file  ?Stress: No Stress Concern Present  ? Feeling of Stress : Not at all  ?Social Connections: Moderately Integrated  ? Frequency of Communication with Friends and Family: More than three times a week  ? Frequency of Social Gatherings with Friends and Family: More than three times a week  ? Attends Religious Services: More than 4 times per year  ? Active Member of Clubs or Organizations: No  ? Attends Archivist Meetings: Never  ? Marital Status: Married  ?Intimate Partner Violence: Not At Risk  ? Fear of Current or Ex-Partner: No  ? Emotionally Abused: No  ? Physically Abused: No  ? Sexually Abused: No  ? ? ?Current Outpatient Medications on File Prior to Visit  ?Medication Sig Dispense Refill  ? aspirin EC 81 MG tablet Take 81 mg by mouth daily.    ? Blood Glucose Monitoring Suppl  (ONE TOUCH ULTRA 2) w/Device KIT Use to check blood sugar daily. 1 each 0  ? cetirizine (ZYRTEC) 10 MG tablet Take 10 mg by mouth daily.    ? Cholecalciferol (D3 ADULT PO) Take 125 mcg by mouth. Take 1 (5000 IU each) daily    ? finasteride (PROSCAR) 5 MG tablet Take 1 tablet (5 mg total) by mouth daily. 90 tablet 3  ? furosemide (LASIX) 40 MG tablet TAKE 1 TABLET BY MOUTH  DAILY (DUE FOR AN APPT IN  APRIL AND CAN GET YEARLY  SUPPLY AT THAT VISIT.   PLEASE CALL TO SCHEDULE) 90 tablet 3  ? levothyroxine (SYNTHROID) 150 MCG tablet TAKE 1 TABLET BY MOUTH EVERY DAY BEFORE BREAKFAST 90 tablet 3  ? Melatonin 5 MG TABS Take 5 mg by mouth at bedtime.    ? Multiple Vitamins-Minerals (PRESERVISION AREDS 2 PO) Take by mouth in the morning and at bedtime.    ? omeprazole (PRILOSEC) 20 MG capsule Take 20 mg by mouth daily.    ? OneTouch Delica Lancets 32U MISC Use to check blood sugar two times a day. Dx: E11.9 200 each 3  ? ONETOUCH ULTRA test strip USE TO CHECK BLOOD SUGAR EVERY OTHER DAY E11.9 50 strip 3  ? PRESCRIPTION MEDICATION every 6 (six) months. *Antibiotic Injection in Eye*    ? ramipril (ALTACE) 2.5 MG capsule TAKE 1 CAPSULE BY MOUTH EVERY DAY 90 capsule 3  ? rosuvastatin (CRESTOR) 10 MG tablet TAKE 1 TABLET BY MOUTH EVERY DAY 90 tablet 3  ? terazosin (HYTRIN) 10 MG capsule TAKE 1 CAPSULE BY MOUTH EVERY DAY 90 capsule 3  ? ?No current facility-administered medications on file prior to visit.  ? ? ?Allergies  ?Allergen Reactions  ? Codeine Nausea Only  ? Sulfonamide Derivatives Hives  ? ? ?Family History  ?Problem Relation Age of Onset  ? Heart failure Mother   ? Coronary artery disease Mother   ? Stroke Mother   ? Hypothyroidism Mother   ? Cancer Father   ?     LUNG  ? Cancer Sister   ?     BREAST  ? Heart failure Maternal Grandmother   ? Hypothyroidism Daughter   ? Colon cancer Neg Hx   ? ? ?BP 140/78 (BP Location: Left Arm, Patient Position: Sitting, Cuff Size: Normal)   Pulse 66   Ht 5' 10"  (1.778 m)   Wt 208 lb  3.2 oz (94.4 kg)   SpO2 93%   BMI 29.87 kg/m?  ? ? ?Review of Systems ? ?   ?Objective:  ? Physical Exam ?VITAL SIGNS:  See vs page ?GENERAL: no distress ?Neck: a healed scar is present.  I do not appr

## 2021-07-22 NOTE — Patient Instructions (Signed)
No medication is needed for the diabetes now.   ?Blood tests are requested for you today.  We'll let you know about the results.    ?If the cancer test is worse, let's check the ultrasound.  ?You should have an endocrinology follow-up appointment in 6 months.   ? ?

## 2021-07-24 LAB — THYROGLOBULIN LEVEL: Thyroglobulin: 0.2 ng/mL — ABNORMAL LOW

## 2021-07-24 LAB — THYROGLOBULIN ANTIBODY: Thyroglobulin Ab: <1 IU/mL (ref <=1)

## 2021-07-26 ENCOUNTER — Other Ambulatory Visit: Payer: PPO | Admitting: *Deleted

## 2021-07-26 DIAGNOSIS — I712 Thoracic aortic aneurysm, without rupture, unspecified: Secondary | ICD-10-CM

## 2021-07-26 LAB — BASIC METABOLIC PANEL
BUN/Creatinine Ratio: 23 (ref 10–24)
BUN: 22 mg/dL (ref 8–27)
CO2: 23 mmol/L (ref 20–29)
Calcium: 9.4 mg/dL (ref 8.6–10.2)
Chloride: 102 mmol/L (ref 96–106)
Creatinine, Ser: 0.97 mg/dL (ref 0.76–1.27)
Glucose: 99 mg/dL (ref 70–99)
Potassium: 4.9 mmol/L (ref 3.5–5.2)
Sodium: 138 mmol/L (ref 134–144)
eGFR: 79 mL/min/{1.73_m2} (ref >59)

## 2021-08-02 ENCOUNTER — Ambulatory Visit (INDEPENDENT_AMBULATORY_CARE_PROVIDER_SITE_OTHER)
Admission: RE | Admit: 2021-08-02 | Discharge: 2021-08-02 | Disposition: A | Discharge status: Home / Self Care | Payer: PPO | Source: Ambulatory Visit | Attending: Cardiovascular Disease | Admitting: Cardiovascular Disease

## 2021-08-02 DIAGNOSIS — I712 Thoracic aortic aneurysm, without rupture, unspecified: Secondary | ICD-10-CM

## 2021-08-02 DIAGNOSIS — I5022 Chronic systolic (congestive) heart failure: Secondary | ICD-10-CM

## 2021-08-02 DIAGNOSIS — I2510 Atherosclerotic heart disease of native coronary artery without angina pectoris: Secondary | ICD-10-CM

## 2021-08-02 DIAGNOSIS — E7800 Pure hypercholesterolemia, unspecified: Secondary | ICD-10-CM

## 2021-08-02 DIAGNOSIS — I7121 Aneurysm of the ascending aorta, without rupture: Secondary | ICD-10-CM | POA: Diagnosis not present

## 2021-08-02 DIAGNOSIS — I428 Other cardiomyopathies: Secondary | ICD-10-CM | POA: Diagnosis not present

## 2021-08-02 MED ORDER — IOHEXOL 350 MG/ML SOLN
100.0000 mL | Freq: Once | INTRAVENOUS | Status: AC | PRN
Start: 2021-08-02 — End: 2021-08-02
  Administered 2021-08-02: 100 mL via INTRAVENOUS

## 2021-08-27 ENCOUNTER — Telehealth: Payer: Medicare Other

## 2021-09-19 DIAGNOSIS — H524 Presbyopia: Secondary | ICD-10-CM | POA: Diagnosis not present

## 2021-09-19 DIAGNOSIS — Z961 Presence of intraocular lens: Secondary | ICD-10-CM | POA: Diagnosis not present

## 2021-09-19 DIAGNOSIS — H5203 Hypermetropia, bilateral: Secondary | ICD-10-CM | POA: Diagnosis not present

## 2021-09-19 DIAGNOSIS — H353112 Nonexudative age-related macular degeneration, right eye, intermediate dry stage: Secondary | ICD-10-CM | POA: Diagnosis not present

## 2021-09-19 DIAGNOSIS — E119 Type 2 diabetes mellitus without complications: Secondary | ICD-10-CM | POA: Diagnosis not present

## 2021-09-19 DIAGNOSIS — H52223 Regular astigmatism, bilateral: Secondary | ICD-10-CM | POA: Diagnosis not present

## 2021-09-19 DIAGNOSIS — Z7984 Long term (current) use of oral hypoglycemic drugs: Secondary | ICD-10-CM | POA: Diagnosis not present

## 2021-09-19 DIAGNOSIS — H353124 Nonexudative age-related macular degeneration, left eye, advanced atrophic with subfoveal involvement: Secondary | ICD-10-CM | POA: Diagnosis not present

## 2021-09-19 DIAGNOSIS — H43813 Vitreous degeneration, bilateral: Secondary | ICD-10-CM | POA: Diagnosis not present

## 2021-09-19 LAB — HM DIABETES EYE EXAM

## 2021-09-23 ENCOUNTER — Encounter: Payer: Self-pay | Admitting: Family Medicine

## 2021-09-25 ENCOUNTER — Other Ambulatory Visit: Payer: Self-pay | Admitting: Family Medicine

## 2021-09-27 DIAGNOSIS — D225 Melanocytic nevi of trunk: Secondary | ICD-10-CM | POA: Diagnosis not present

## 2021-09-27 DIAGNOSIS — L821 Other seborrheic keratosis: Secondary | ICD-10-CM | POA: Diagnosis not present

## 2021-09-27 DIAGNOSIS — D2271 Melanocytic nevi of right lower limb, including hip: Secondary | ICD-10-CM | POA: Diagnosis not present

## 2021-09-27 DIAGNOSIS — X32XXXD Exposure to sunlight, subsequent encounter: Secondary | ICD-10-CM | POA: Diagnosis not present

## 2021-09-27 DIAGNOSIS — L570 Actinic keratosis: Secondary | ICD-10-CM | POA: Diagnosis not present

## 2021-09-27 DIAGNOSIS — D485 Neoplasm of uncertain behavior of skin: Secondary | ICD-10-CM | POA: Diagnosis not present

## 2021-09-27 DIAGNOSIS — Z1283 Encounter for screening for malignant neoplasm of skin: Secondary | ICD-10-CM | POA: Diagnosis not present

## 2021-10-08 DIAGNOSIS — E890 Postprocedural hypothyroidism: Secondary | ICD-10-CM | POA: Diagnosis not present

## 2021-10-08 DIAGNOSIS — I5042 Chronic combined systolic (congestive) and diastolic (congestive) heart failure: Secondary | ICD-10-CM | POA: Diagnosis not present

## 2021-10-08 DIAGNOSIS — E7800 Pure hypercholesterolemia, unspecified: Secondary | ICD-10-CM | POA: Diagnosis not present

## 2021-10-08 DIAGNOSIS — I10 Essential (primary) hypertension: Secondary | ICD-10-CM | POA: Diagnosis not present

## 2021-10-08 DIAGNOSIS — E1165 Type 2 diabetes mellitus with hyperglycemia: Secondary | ICD-10-CM | POA: Diagnosis not present

## 2021-10-08 DIAGNOSIS — I2510 Atherosclerotic heart disease of native coronary artery without angina pectoris: Secondary | ICD-10-CM | POA: Diagnosis not present

## 2021-10-08 DIAGNOSIS — C73 Malignant neoplasm of thyroid gland: Secondary | ICD-10-CM | POA: Diagnosis not present

## 2021-10-17 ENCOUNTER — Telehealth: Payer: Self-pay | Admitting: Family Medicine

## 2021-10-17 DIAGNOSIS — E11319 Type 2 diabetes mellitus with unspecified diabetic retinopathy without macular edema: Secondary | ICD-10-CM

## 2021-10-17 NOTE — Telephone Encounter (Signed)
-----   Message from Ellamae Sia sent at 10/07/2021  2:16 PM EDT ----- Regarding: Lab orders for Tuesday, 8.1.23 Lab orders for dm appt

## 2021-10-22 ENCOUNTER — Other Ambulatory Visit (INDEPENDENT_AMBULATORY_CARE_PROVIDER_SITE_OTHER): Payer: PPO

## 2021-10-22 DIAGNOSIS — E11319 Type 2 diabetes mellitus with unspecified diabetic retinopathy without macular edema: Secondary | ICD-10-CM

## 2021-10-22 LAB — COMPREHENSIVE METABOLIC PANEL
ALT: 11 U/L (ref 0–53)
AST: 14 U/L (ref 0–37)
Albumin: 4.2 g/dL (ref 3.5–5.2)
Alkaline Phosphatase: 74 U/L (ref 39–117)
BUN: 21 mg/dL (ref 6–23)
CO2: 25 mEq/L (ref 19–32)
Calcium: 9.2 mg/dL (ref 8.4–10.5)
Chloride: 104 mEq/L (ref 96–112)
Creatinine, Ser: 1 mg/dL (ref 0.40–1.50)
GFR: 70.94 mL/min (ref >60.00)
Glucose, Bld: 101 mg/dL — ABNORMAL HIGH (ref 70–99)
Potassium: 4 mEq/L (ref 3.5–5.1)
Sodium: 138 mEq/L (ref 135–145)
Total Bilirubin: 0.8 mg/dL (ref 0.2–1.2)
Total Protein: 7.1 g/dL (ref 6.0–8.3)

## 2021-10-22 LAB — LIPID PANEL
Cholesterol: 127 mg/dL (ref 0–200)
HDL: 35.9 mg/dL — ABNORMAL LOW (ref >39.00)
LDL Cholesterol: 72 mg/dL (ref 0–99)
NonHDL: 90.78
Total CHOL/HDL Ratio: 4
Triglycerides: 96 mg/dL (ref 0.0–149.0)
VLDL: 19.2 mg/dL (ref 0.0–40.0)

## 2021-10-22 LAB — HEMOGLOBIN A1C: Hgb A1c MFr Bld: 6.8 % — ABNORMAL HIGH (ref 4.6–6.5)

## 2021-10-23 NOTE — Progress Notes (Signed)
No critical labs need to be addressed urgently. We will discuss labs in detail at upcoming office visit.   

## 2021-10-29 ENCOUNTER — Encounter: Payer: Self-pay | Admitting: Family Medicine

## 2021-10-29 ENCOUNTER — Ambulatory Visit (INDEPENDENT_AMBULATORY_CARE_PROVIDER_SITE_OTHER): Payer: PPO | Admitting: Family Medicine

## 2021-10-29 VITALS — BP 104/68 | HR 61 | Temp 98.2°F | Ht 70.0 in | Wt 206.1 lb

## 2021-10-29 DIAGNOSIS — E1169 Type 2 diabetes mellitus with other specified complication: Secondary | ICD-10-CM | POA: Diagnosis not present

## 2021-10-29 DIAGNOSIS — E11319 Type 2 diabetes mellitus with unspecified diabetic retinopathy without macular edema: Secondary | ICD-10-CM | POA: Diagnosis not present

## 2021-10-29 DIAGNOSIS — K219 Gastro-esophageal reflux disease without esophagitis: Secondary | ICD-10-CM

## 2021-10-29 DIAGNOSIS — E785 Hyperlipidemia, unspecified: Secondary | ICD-10-CM | POA: Diagnosis not present

## 2021-10-29 DIAGNOSIS — E1159 Type 2 diabetes mellitus with other circulatory complications: Secondary | ICD-10-CM | POA: Diagnosis not present

## 2021-10-29 DIAGNOSIS — I152 Hypertension secondary to endocrine disorders: Secondary | ICD-10-CM | POA: Diagnosis not present

## 2021-10-29 MED ORDER — OMEPRAZOLE 20 MG PO CPDR: 20.0000 mg | DELAYED_RELEASE_CAPSULE | Freq: Every day | ORAL | 3 refills | Status: DC

## 2021-10-29 NOTE — Assessment & Plan Note (Signed)
Chronic, almost at goal LDL less than 70 on Crestor 10 mg daily

## 2021-10-29 NOTE — Progress Notes (Signed)
Patient ID: Dominic Kane, male    DOB: 05-24-1940, 81 y.o.   MRN: [id number]  This visit was conducted in person.  BP 104/68   Pulse 61   Temp 98.2 F (36.8 C) (Oral)   Ht 5' 10"  (1.778 m)   Wt 206 lb 2 oz (93.5 kg)   SpO2 95%   BMI 29.58 kg/m    CC:  Chief Complaint  Patient presents with   Diabetes    Subjective:   HPI: Dominic Kane is a 81 y.o. male presenting on 10/29/2021 for Diabetes  Diabetes:  Diet controlled. Increased  A1C some given diet not as good, back to working  Lab Results  Component Value Date   HGBA1C 6.8 (H) 10/22/2021  Using medications without difficulties: Hypoglycemic episodes:none Hyperglycemic episodes: none Feet problems: no ulcers Blood Sugars averaging:  FBS 101-107, no checking post prandial eye exam within last year: yes Wt Readings from Last 3 Encounters:  10/29/21 206 lb 2 oz (93.5 kg)  07/22/21 208 lb 3.2 oz (94.4 kg)  04/30/21 206 lb 4 oz (93.6 kg)    Hypertension:   Well controlled on ramipril 2.5 mg daily BP Readings from Last 3 Encounters:  10/29/21 104/68  07/22/21 140/78  04/30/21 114/70  Using medication without problems or lightheadedness:  none Chest pain with exertion: none Edema: none Short of breath: none Average home BPs: 120/70s Other issues: Cardiomyopathy and CHF   Elevated Cholesterol: LDL  almost at goal < 70 on Crestor 10 mg daily   Lab Results  Component Value Date   CHOL 127 10/22/2021   HDL 35.90 (L) 10/22/2021   LDLCALC 72 10/22/2021   TRIG 96.0 10/22/2021   CHOLHDL 4 10/22/2021    Using medications without problems: Muscle aches:  Diet compliance: good Exercise: good Other complaints:    Relevant past medical, surgical, family and social history reviewed and updated as indicated. Interim medical history since our last visit reviewed. Allergies and medications reviewed and updated. Outpatient Medications Prior to Visit  Medication Sig Dispense Refill   aspirin EC 81 MG tablet  Take 81 mg by mouth daily.     Blood Glucose Monitoring Suppl (ONE TOUCH ULTRA 2) w/Device KIT by Does not apply route. Use to check blood sugar every other day     cetirizine (ZYRTEC) 10 MG tablet Take 10 mg by mouth daily.     finasteride (PROSCAR) 5 MG tablet Take 1 tablet (5 mg total) by mouth daily. 90 tablet 3   furosemide (LASIX) 40 MG tablet TAKE 1 TABLET BY MOUTH  DAILY (DUE FOR AN APPT IN  APRIL AND CAN GET YEARLY  SUPPLY AT THAT VISIT.   PLEASE CALL TO SCHEDULE) 90 tablet 3   levothyroxine (SYNTHROID) 150 MCG tablet TAKE 1 TABLET BY MOUTH EVERY DAY BEFORE BREAKFAST 90 tablet 3   melatonin 3 MG TABS tablet Take 3 mg by mouth at bedtime.     Multiple Vitamins-Minerals (PRESERVISION AREDS 2 PO) Take by mouth in the morning and at bedtime.     omeprazole (PRILOSEC) 20 MG capsule Take 20 mg by mouth daily.     OneTouch Delica Lancets 87F MISC by Does not apply route. Use to check blood sugar every other day     ONETOUCH ULTRA test strip USE TO CHECK BLOOD SUGAR EVERY OTHER DAY E11.9 50 strip 3   PRESCRIPTION MEDICATION every 6 (six) months. *Antibiotic Injection in Eye*     ramipril (ALTACE) 2.5 MG capsule  TAKE 1 CAPSULE BY MOUTH EVERY DAY 90 capsule 3   rosuvastatin (CRESTOR) 10 MG tablet TAKE 1 TABLET BY MOUTH EVERY DAY 90 tablet 3   terazosin (HYTRIN) 10 MG capsule TAKE 1 CAPSULE BY MOUTH EVERY DAY 90 capsule 3   Blood Glucose Monitoring Suppl (ONE TOUCH ULTRA 2) w/Device KIT Use to check blood sugar daily. (Patient taking differently: Use to check blood sugar every other day) 1 each 0   OneTouch Delica Lancets 25K MISC Use to check blood sugar two times a day. Dx: E11.9 (Patient taking differently: Use to check blood sugar every other day. Dx: E11.9) 200 each 3   Cholecalciferol (D3 ADULT PO) Take 125 mcg by mouth. Take 1 (5000 IU each) daily     Melatonin 5 MG TABS Take 5 mg by mouth at bedtime.     No facility-administered medications prior to visit.     Per HPI unless specifically  indicated in ROS section below Review of Systems  Constitutional:  Negative for fatigue and fever.  HENT:  Negative for ear pain.   Eyes:  Negative for pain.  Respiratory:  Negative for cough and shortness of breath.   Cardiovascular:  Negative for chest pain, palpitations and leg swelling.  Gastrointestinal:  Negative for abdominal pain.  Genitourinary:  Negative for dysuria.  Musculoskeletal:  Negative for arthralgias.  Neurological:  Negative for syncope, light-headedness and headaches.  Psychiatric/Behavioral:  Negative for dysphoric mood.    Objective:  BP 104/68   Pulse 61   Temp 98.2 F (36.8 C) (Oral)   Ht 5' 10"  (1.778 m)   Wt 206 lb 2 oz (93.5 kg)   SpO2 95%   BMI 29.58 kg/m   Wt Readings from Last 3 Encounters:  10/29/21 206 lb 2 oz (93.5 kg)  07/22/21 208 lb 3.2 oz (94.4 kg)  04/30/21 206 lb 4 oz (93.6 kg)      Physical Exam Constitutional:      Appearance: He is well-developed.  HENT:     Head: Normocephalic.     Right Ear: Hearing normal.     Left Ear: Hearing normal.     Nose: Nose normal.  Neck:     Thyroid: No thyroid mass or thyromegaly.     Vascular: No carotid bruit.     Trachea: Trachea normal.  Cardiovascular:     Rate and Rhythm: Normal rate and regular rhythm.     Pulses: Normal pulses.     Heart sounds: Heart sounds not distant. No murmur heard.    No friction rub. No gallop.     Comments: No peripheral edema Pulmonary:     Effort: Pulmonary effort is normal. No respiratory distress.     Breath sounds: Normal breath sounds.  Skin:    General: Skin is warm and dry.     Findings: No rash.  Psychiatric:        Speech: Speech normal.        Behavior: Behavior normal.        Thought Content: Thought content normal.       Results for orders placed or performed in visit on 10/22/21  Comprehensive metabolic panel  Result Value Ref Range   Sodium 138 135 - 145 mEq/L   Potassium 4.0 3.5 - 5.1 mEq/L   Chloride 104 96 - 112 mEq/L   CO2 25  19 - 32 mEq/L   Glucose, Bld 101 (H) 70 - 99 mg/dL   BUN 21 6 - 23 mg/dL  Creatinine, Ser 1.00 0.40 - 1.50 mg/dL   Total Bilirubin 0.8 0.2 - 1.2 mg/dL   Alkaline Phosphatase 74 39 - 117 U/L   AST 14 0 - 37 U/L   ALT 11 0 - 53 U/L   Total Protein 7.1 6.0 - 8.3 g/dL   Albumin 4.2 3.5 - 5.2 g/dL   GFR 70.94 >60.00 mL/min   Calcium 9.2 8.4 - 10.5 mg/dL  Lipid panel  Result Value Ref Range   Cholesterol 127 0 - 200 mg/dL   Triglycerides 96.0 0.0 - 149.0 mg/dL   HDL 35.90 (L) >39.00 mg/dL   VLDL 19.2 0.0 - 40.0 mg/dL   LDL Cholesterol 72 0 - 99 mg/dL   Total CHOL/HDL Ratio 4    NonHDL 90.78   Hemoglobin A1c  Result Value Ref Range   Hgb A1c MFr Bld 6.8 (H) 4.6 - 6.5 %     COVID 19 screen:  No recent travel or known exposure to COVID19 The patient denies respiratory symptoms of COVID 19 at this time. The importance of social distancing was discussed today.   Assessment and Plan    Problem List Items Addressed This Visit     Controlled type 2 diabetes with retinopathy ([place]) - Primary    Chronic, slight worsening control.  Continue working on [employer] and regular exercise.  Reevaluate in 6 months.      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      GERD (gastroesophageal reflux disease)    GERD, well controlled on omeprazole 20 mg daily.[name] encouraged to try weaning off if able over time,      Relevant Medications   omeprazole (PRILOSEC) 20 MG capsule   Hyperlipidemia associated with type 2 diabetes mellitus (HCC)    Chronic, almost at goal LDL less than 70 on Crestor 10 mg daily      Hypertension associated with diabetes ([place])    Chronic, well-controlled  Ramipril 2.5 mg daily        Meds ordered this encounter  Medications   omeprazole (PRILOSEC) 20 MG capsule    Sig: Take 1 capsule (20 mg total) by mouth daily.    Dispense:  90 capsule    Refill:  3     Eliezer Lofts, MD

## 2021-10-29 NOTE — Assessment & Plan Note (Signed)
GERD, well controlled on omeprazole 20 mg daily.[name] encouraged to try weaning off if able over time,

## 2021-10-29 NOTE — Patient Instructions (Signed)
Get back on track with low carb diet. Keep up with regular exercise.

## 2021-10-29 NOTE — Assessment & Plan Note (Signed)
Chronic, well-controlled  Ramipril 2.5 mg daily

## 2021-10-29 NOTE — Assessment & Plan Note (Signed)
Chronic, slight worsening control.  Continue working on [employer] and regular exercise.  Reevaluate in 6 months.

## 2021-10-29 NOTE — Assessment & Plan Note (Signed)
Chronic, followed by ophthalmology.

## 2021-11-04 ENCOUNTER — Encounter (INDEPENDENT_AMBULATORY_CARE_PROVIDER_SITE_OTHER): Payer: PPO | Admitting: Ophthalmology

## 2021-11-04 DIAGNOSIS — H43813 Vitreous degeneration, bilateral: Secondary | ICD-10-CM | POA: Diagnosis not present

## 2021-11-04 DIAGNOSIS — H353132 Nonexudative age-related macular degeneration, bilateral, intermediate dry stage: Secondary | ICD-10-CM

## 2021-11-04 DIAGNOSIS — H34811 Central retinal vein occlusion, right eye, with macular edema: Secondary | ICD-10-CM | POA: Diagnosis not present

## 2021-11-04 DIAGNOSIS — H35033 Hypertensive retinopathy, bilateral: Secondary | ICD-10-CM | POA: Diagnosis not present

## 2021-11-04 DIAGNOSIS — I10 Essential (primary) hypertension: Secondary | ICD-10-CM

## 2021-11-04 DIAGNOSIS — H33301 Unspecified retinal break, right eye: Secondary | ICD-10-CM | POA: Diagnosis not present

## 2021-12-03 ENCOUNTER — Other Ambulatory Visit: Payer: Self-pay

## 2021-12-03 MED ORDER — ROSUVASTATIN CALCIUM 10 MG PO TABS: 10.0000 mg | ORAL_TABLET | Freq: Every day | ORAL | 1 refills | Status: DC

## 2021-12-30 DIAGNOSIS — E039 Hypothyroidism, unspecified: Secondary | ICD-10-CM | POA: Diagnosis not present

## 2021-12-30 DIAGNOSIS — H547 Unspecified visual loss: Secondary | ICD-10-CM | POA: Diagnosis not present

## 2021-12-30 DIAGNOSIS — E1169 Type 2 diabetes mellitus with other specified complication: Secondary | ICD-10-CM | POA: Diagnosis not present

## 2021-12-30 DIAGNOSIS — E663 Overweight: Secondary | ICD-10-CM | POA: Diagnosis not present

## 2021-12-30 DIAGNOSIS — G620 Drug-induced polyneuropathy: Secondary | ICD-10-CM | POA: Diagnosis not present

## 2021-12-30 DIAGNOSIS — E1140 Type 2 diabetes mellitus with diabetic neuropathy, unspecified: Secondary | ICD-10-CM | POA: Diagnosis not present

## 2021-12-30 DIAGNOSIS — H3530 Unspecified macular degeneration: Secondary | ICD-10-CM | POA: Diagnosis not present

## 2021-12-30 DIAGNOSIS — E890 Postprocedural hypothyroidism: Secondary | ICD-10-CM | POA: Diagnosis not present

## 2021-12-30 DIAGNOSIS — G8929 Other chronic pain: Secondary | ICD-10-CM | POA: Diagnosis not present

## 2021-12-30 DIAGNOSIS — E785 Hyperlipidemia, unspecified: Secondary | ICD-10-CM | POA: Diagnosis not present

## 2021-12-30 DIAGNOSIS — E038 Other specified hypothyroidism: Secondary | ICD-10-CM | POA: Diagnosis not present

## 2021-12-30 DIAGNOSIS — G63 Polyneuropathy in diseases classified elsewhere: Secondary | ICD-10-CM | POA: Diagnosis not present

## 2021-12-31 ENCOUNTER — Other Ambulatory Visit: Payer: Self-pay

## 2021-12-31 MED ORDER — RAMIPRIL 2.5 MG PO CAPS: ORAL_CAPSULE | ORAL | 0 refills | Status: DC

## 2022-01-07 ENCOUNTER — Telehealth: Payer: Self-pay | Admitting: Family Medicine

## 2022-01-07 MED ORDER — TERAZOSIN HCL 10 MG PO CAPS: 10.0000 mg | ORAL_CAPSULE | Freq: Every day | ORAL | 3 refills | Status: DC

## 2022-01-07 NOTE — Telephone Encounter (Signed)
Patient also dropped off paperwork from [facility] about his Flu and Shingrix vaccine.  Immunization record updated.

## 2022-01-07 NOTE — Telephone Encounter (Signed)
Refill sent as requested. 

## 2022-01-07 NOTE — Telephone Encounter (Signed)
Caller Name: omarri Call back phone #: [phone]  MEDICATION(S):  terazosin (HYTRIN) 10 MG capsule  Days of Med Remaining: 0  Has the patient contacted their pharmacy (YES/NO)? NO What did pharmacy advise?   [facility]   ~~~Please advise patient/caregiver to allow 2-3 business days to process RX refills.    Pt [facility] off a [facility] for bedsole to review. Forms will be pcp's folder

## 2022-01-14 DIAGNOSIS — X32XXXD Exposure to sunlight, subsequent encounter: Secondary | ICD-10-CM | POA: Diagnosis not present

## 2022-01-14 DIAGNOSIS — B078 Other viral warts: Secondary | ICD-10-CM | POA: Diagnosis not present

## 2022-01-14 DIAGNOSIS — L821 Other seborrheic keratosis: Secondary | ICD-10-CM | POA: Diagnosis not present

## 2022-01-14 DIAGNOSIS — L570 Actinic keratosis: Secondary | ICD-10-CM | POA: Diagnosis not present

## 2022-01-14 DIAGNOSIS — D225 Melanocytic nevi of trunk: Secondary | ICD-10-CM | POA: Diagnosis not present

## 2022-01-20 ENCOUNTER — Ambulatory Visit: Payer: PPO | Admitting: Internal Medicine

## 2022-01-20 ENCOUNTER — Telehealth: Payer: Self-pay | Admitting: Family Medicine

## 2022-01-20 NOTE — Telephone Encounter (Signed)
Patient called and stated take the [facility] and wants to make sure it doesn't interfere with any other medication cause tylenol doesn't work. Call back number [phone].

## 2022-01-22 NOTE — Telephone Encounter (Signed)
He can use Aleve on a limited basis but it is not a good long-term medication given increase his risk for heart disease and kidney irritation.  Tylenol is a safer option

## 2022-01-22 NOTE — Telephone Encounter (Signed)
Dominic Kane notified as instructed by telephone.  Patient states understanding.

## 2022-03-01 ENCOUNTER — Emergency Department (HOSPITAL_COMMUNITY): Payer: PPO

## 2022-03-01 ENCOUNTER — Inpatient Hospital Stay (HOSPITAL_COMMUNITY)
Admission: EM | Admit: 2022-03-01 | Discharge: 2022-03-09 | DRG: 378 | Disposition: A | Discharge status: Home / Self Care | Payer: PPO | Attending: Internal Medicine | Admitting: Internal Medicine

## 2022-03-01 DIAGNOSIS — E1169 Type 2 diabetes mellitus with other specified complication: Secondary | ICD-10-CM | POA: Diagnosis present

## 2022-03-01 DIAGNOSIS — I110 Hypertensive heart disease with heart failure: Secondary | ICD-10-CM | POA: Diagnosis not present

## 2022-03-01 DIAGNOSIS — K648 Other hemorrhoids: Secondary | ICD-10-CM | POA: Diagnosis present

## 2022-03-01 DIAGNOSIS — K922 Gastrointestinal hemorrhage, unspecified: Secondary | ICD-10-CM | POA: Diagnosis present

## 2022-03-01 DIAGNOSIS — I4510 Unspecified right bundle-branch block: Secondary | ICD-10-CM | POA: Diagnosis present

## 2022-03-01 DIAGNOSIS — I5022 Chronic systolic (congestive) heart failure: Secondary | ICD-10-CM | POA: Diagnosis not present

## 2022-03-01 DIAGNOSIS — E669 Obesity, unspecified: Secondary | ICD-10-CM | POA: Diagnosis present

## 2022-03-01 DIAGNOSIS — I5042 Chronic combined systolic (congestive) and diastolic (congestive) heart failure: Secondary | ICD-10-CM | POA: Diagnosis present

## 2022-03-01 DIAGNOSIS — Z803 Family history of malignant neoplasm of breast: Secondary | ICD-10-CM

## 2022-03-01 DIAGNOSIS — Z9842 Cataract extraction status, left eye: Secondary | ICD-10-CM | POA: Diagnosis not present

## 2022-03-01 DIAGNOSIS — R0789 Other chest pain: Secondary | ICD-10-CM | POA: Diagnosis not present

## 2022-03-01 DIAGNOSIS — D638 Anemia in other chronic diseases classified elsewhere: Secondary | ICD-10-CM | POA: Diagnosis not present

## 2022-03-01 DIAGNOSIS — Z86010 Personal history of colonic polyps: Secondary | ICD-10-CM

## 2022-03-01 DIAGNOSIS — Z8249 Family history of ischemic heart disease and other diseases of the circulatory system: Secondary | ICD-10-CM

## 2022-03-01 DIAGNOSIS — E890 Postprocedural hypothyroidism: Secondary | ICD-10-CM | POA: Diagnosis present

## 2022-03-01 DIAGNOSIS — I509 Heart failure, unspecified: Secondary | ICD-10-CM | POA: Diagnosis not present

## 2022-03-01 DIAGNOSIS — R195 Other fecal abnormalities: Secondary | ICD-10-CM | POA: Diagnosis not present

## 2022-03-01 DIAGNOSIS — Z882 Allergy status to sulfonamides status: Secondary | ICD-10-CM

## 2022-03-01 DIAGNOSIS — Z9841 Cataract extraction status, right eye: Secondary | ICD-10-CM

## 2022-03-01 DIAGNOSIS — D509 Iron deficiency anemia, unspecified: Secondary | ICD-10-CM

## 2022-03-01 DIAGNOSIS — K222 Esophageal obstruction: Secondary | ICD-10-CM | POA: Diagnosis not present

## 2022-03-01 DIAGNOSIS — K5730 Diverticulosis of large intestine without perforation or abscess without bleeding: Secondary | ICD-10-CM | POA: Diagnosis not present

## 2022-03-01 DIAGNOSIS — Z79899 Other long term (current) drug therapy: Secondary | ICD-10-CM

## 2022-03-01 DIAGNOSIS — N281 Cyst of kidney, acquired: Secondary | ICD-10-CM | POA: Diagnosis not present

## 2022-03-01 DIAGNOSIS — I10 Essential (primary) hypertension: Secondary | ICD-10-CM | POA: Diagnosis not present

## 2022-03-01 DIAGNOSIS — D62 Acute posthemorrhagic anemia: Secondary | ICD-10-CM | POA: Diagnosis present

## 2022-03-01 DIAGNOSIS — E11319 Type 2 diabetes mellitus with unspecified diabetic retinopathy without macular edema: Secondary | ICD-10-CM | POA: Diagnosis present

## 2022-03-01 DIAGNOSIS — Z885 Allergy status to narcotic agent status: Secondary | ICD-10-CM

## 2022-03-01 DIAGNOSIS — Z87442 Personal history of urinary calculi: Secondary | ICD-10-CM

## 2022-03-01 DIAGNOSIS — Z7982 Long term (current) use of aspirin: Secondary | ICD-10-CM

## 2022-03-01 DIAGNOSIS — E039 Hypothyroidism, unspecified: Secondary | ICD-10-CM | POA: Diagnosis present

## 2022-03-01 DIAGNOSIS — R079 Chest pain, unspecified: Secondary | ICD-10-CM | POA: Diagnosis not present

## 2022-03-01 DIAGNOSIS — K259 Gastric ulcer, unspecified as acute or chronic, without hemorrhage or perforation: Secondary | ICD-10-CM | POA: Diagnosis not present

## 2022-03-01 DIAGNOSIS — Z85850 Personal history of malignant neoplasm of thyroid: Secondary | ICD-10-CM

## 2022-03-01 DIAGNOSIS — I2510 Atherosclerotic heart disease of native coronary artery without angina pectoris: Secondary | ICD-10-CM | POA: Diagnosis not present

## 2022-03-01 DIAGNOSIS — K921 Melena: Secondary | ICD-10-CM | POA: Diagnosis not present

## 2022-03-01 DIAGNOSIS — K254 Chronic or unspecified gastric ulcer with hemorrhage: Secondary | ICD-10-CM | POA: Diagnosis present

## 2022-03-01 DIAGNOSIS — D500 Iron deficiency anemia secondary to blood loss (chronic): Secondary | ICD-10-CM | POA: Diagnosis not present

## 2022-03-01 DIAGNOSIS — T4145XA Adverse effect of unspecified anesthetic, initial encounter: Secondary | ICD-10-CM | POA: Diagnosis not present

## 2022-03-01 DIAGNOSIS — H9193 Unspecified hearing loss, bilateral: Secondary | ICD-10-CM | POA: Diagnosis present

## 2022-03-01 DIAGNOSIS — Z87891 Personal history of nicotine dependence: Secondary | ICD-10-CM

## 2022-03-01 DIAGNOSIS — D122 Benign neoplasm of ascending colon: Secondary | ICD-10-CM | POA: Diagnosis present

## 2022-03-01 DIAGNOSIS — E785 Hyperlipidemia, unspecified: Secondary | ICD-10-CM | POA: Diagnosis present

## 2022-03-01 DIAGNOSIS — K5731 Diverticulosis of large intestine without perforation or abscess with bleeding: Principal | ICD-10-CM

## 2022-03-01 DIAGNOSIS — Z6830 Body mass index (BMI) 30.0-30.9, adult: Secondary | ICD-10-CM

## 2022-03-01 DIAGNOSIS — E876 Hypokalemia: Secondary | ICD-10-CM | POA: Diagnosis present

## 2022-03-01 DIAGNOSIS — K625 Hemorrhage of anus and rectum: Secondary | ICD-10-CM | POA: Diagnosis present

## 2022-03-01 DIAGNOSIS — D120 Benign neoplasm of cecum: Secondary | ICD-10-CM | POA: Diagnosis not present

## 2022-03-01 DIAGNOSIS — I959 Hypotension, unspecified: Secondary | ICD-10-CM | POA: Diagnosis present

## 2022-03-01 DIAGNOSIS — Z96652 Presence of left artificial knee joint: Secondary | ICD-10-CM | POA: Diagnosis present

## 2022-03-01 DIAGNOSIS — Z974 Presence of external hearing-aid: Secondary | ICD-10-CM

## 2022-03-01 DIAGNOSIS — Z79890 Hormone replacement therapy (postmenopausal): Secondary | ICD-10-CM

## 2022-03-01 DIAGNOSIS — D649 Anemia, unspecified: Secondary | ICD-10-CM | POA: Diagnosis not present

## 2022-03-01 DIAGNOSIS — Z823 Family history of stroke: Secondary | ICD-10-CM | POA: Diagnosis not present

## 2022-03-01 DIAGNOSIS — E119 Type 2 diabetes mellitus without complications: Secondary | ICD-10-CM | POA: Diagnosis not present

## 2022-03-01 DIAGNOSIS — K5900 Constipation, unspecified: Secondary | ICD-10-CM | POA: Diagnosis present

## 2022-03-01 DIAGNOSIS — N400 Enlarged prostate without lower urinary tract symptoms: Secondary | ICD-10-CM | POA: Diagnosis present

## 2022-03-01 DIAGNOSIS — K3189 Other diseases of stomach and duodenum: Secondary | ICD-10-CM | POA: Diagnosis not present

## 2022-03-01 DIAGNOSIS — Z85828 Personal history of other malignant neoplasm of skin: Secondary | ICD-10-CM | POA: Diagnosis not present

## 2022-03-01 DIAGNOSIS — K635 Polyp of colon: Secondary | ICD-10-CM | POA: Diagnosis not present

## 2022-03-01 DIAGNOSIS — I700 Atherosclerosis of aorta: Secondary | ICD-10-CM | POA: Diagnosis not present

## 2022-03-01 DIAGNOSIS — E11311 Type 2 diabetes mellitus with unspecified diabetic retinopathy with macular edema: Secondary | ICD-10-CM | POA: Diagnosis not present

## 2022-03-01 DIAGNOSIS — K449 Diaphragmatic hernia without obstruction or gangrene: Secondary | ICD-10-CM | POA: Diagnosis not present

## 2022-03-01 DIAGNOSIS — Z801 Family history of malignant neoplasm of trachea, bronchus and lung: Secondary | ICD-10-CM

## 2022-03-01 DIAGNOSIS — I952 Hypotension due to drugs: Secondary | ICD-10-CM | POA: Diagnosis present

## 2022-03-01 LAB — BASIC METABOLIC PANEL
Anion gap: 10 (ref 5–15)
BUN: 30 mg/dL — ABNORMAL HIGH (ref 8–23)
CO2: 21 mmol/L — ABNORMAL LOW (ref 22–32)
Calcium: 9.1 mg/dL (ref 8.9–10.3)
Chloride: 106 mmol/L (ref 98–111)
Creatinine, Ser: 1.27 mg/dL — ABNORMAL HIGH (ref 0.61–1.24)
GFR, Estimated: 57 mL/min — ABNORMAL LOW (ref >60)
Glucose, Bld: 156 mg/dL — ABNORMAL HIGH (ref 70–99)
Potassium: 4.2 mmol/L (ref 3.5–5.1)
Sodium: 137 mmol/L (ref 135–145)

## 2022-03-01 LAB — HEMOGLOBIN AND HEMATOCRIT, BLOOD
HCT: 28.9 % — ABNORMAL LOW (ref 39.0–52.0)
HCT: 26.6 % — ABNORMAL LOW (ref 39.0–52.0)
Hemoglobin: 9.9 g/dL — ABNORMAL LOW (ref 13.0–17.0)
Hemoglobin: 9 g/dL — ABNORMAL LOW (ref 13.0–17.0)

## 2022-03-01 LAB — HEPATIC FUNCTION PANEL
ALT: 15 U/L (ref 0–44)
AST: 21 U/L (ref 15–41)
Albumin: 3.7 g/dL (ref 3.5–5.0)
Alkaline Phosphatase: 68 U/L (ref 38–126)
Bilirubin, Direct: <0.1 mg/dL (ref 0.0–0.2)
Total Bilirubin: 0.8 mg/dL (ref 0.3–1.2)
Total Protein: 6.2 g/dL — ABNORMAL LOW (ref 6.5–8.1)

## 2022-03-01 LAB — MRSA NEXT GEN BY PCR, NASAL: MRSA by PCR Next Gen: NOT DETECTED

## 2022-03-01 LAB — CBC
HCT: 30.6 % — ABNORMAL LOW (ref 39.0–52.0)
Hemoglobin: 9.8 g/dL — ABNORMAL LOW (ref 13.0–17.0)
MCH: 30.2 pg (ref 26.0–34.0)
MCHC: 32 g/dL (ref 30.0–36.0)
MCV: 94.2 fL (ref 80.0–100.0)
Platelets: 189 10*3/uL (ref 150–400)
RBC: 3.25 MIL/uL — ABNORMAL LOW (ref 4.22–5.81)
RDW: 13.3 % (ref 11.5–15.5)
WBC: 8.3 10*3/uL (ref 4.0–10.5)
nRBC: 0 % (ref 0.0–0.2)

## 2022-03-01 LAB — TSH: TSH: 0.707 u[IU]/mL (ref 0.350–4.500)

## 2022-03-01 LAB — TROPONIN I (HIGH SENSITIVITY)
Troponin I (High Sensitivity): 7 ng/L (ref <18)
Troponin I (High Sensitivity): 7 ng/L (ref <18)

## 2022-03-01 LAB — POC OCCULT BLOOD, ED: Fecal Occult Bld: POSITIVE — AB

## 2022-03-01 LAB — BRAIN NATRIURETIC PEPTIDE: B Natriuretic Peptide: 149.7 pg/mL — ABNORMAL HIGH (ref 0.0–100.0)

## 2022-03-01 MED ORDER — SODIUM CHLORIDE 0.9 % IV SOLN
10.0000 mL/h | Freq: Once | INTRAVENOUS | Status: AC
  Administered 2022-03-01: 10 mL/h via INTRAVENOUS

## 2022-03-01 MED ORDER — MELATONIN 5 MG PO TABS
5.0000 mg | ORAL_TABLET | Freq: Every day | ORAL | Status: DC
  Administered 2022-03-01 – 2022-03-08 (×7): 5 mg via ORAL
  Filled 2022-03-01 (×7): qty 1

## 2022-03-01 MED ORDER — LEVOTHYROXINE SODIUM 75 MCG PO TABS
150.0000 ug | ORAL_TABLET | Freq: Every day | ORAL | Status: DC
  Administered 2022-03-02 – 2022-03-09 (×7): 150 ug via ORAL
  Filled 2022-03-01 (×7): qty 2

## 2022-03-01 MED ORDER — ACETAMINOPHEN 325 MG PO TABS: 650.0000 mg | ORAL_TABLET | Freq: Four times a day (QID) | ORAL | Status: DC | PRN

## 2022-03-01 MED ORDER — ROSUVASTATIN CALCIUM 5 MG PO TABS
10.0000 mg | ORAL_TABLET | Freq: Every day | ORAL | Status: DC
  Administered 2022-03-01 – 2022-03-06 (×6): 10 mg via ORAL
  Filled 2022-03-01 (×6): qty 2

## 2022-03-01 MED ORDER — PANTOPRAZOLE 80MG IVPB - SIMPLE MED
80.0000 mg | Freq: Once | INTRAVENOUS | Status: AC
  Administered 2022-03-01: 80 mg via INTRAVENOUS
  Filled 2022-03-01: qty 100

## 2022-03-01 MED ORDER — PANTOPRAZOLE SODIUM 40 MG IV SOLR: 40.0000 mg | Freq: Two times a day (BID) | INTRAVENOUS | Status: DC

## 2022-03-01 MED ORDER — ONDANSETRON HCL 4 MG PO TABS: 4.0000 mg | ORAL_TABLET | Freq: Four times a day (QID) | ORAL | Status: DC | PRN

## 2022-03-01 MED ORDER — ALBUTEROL SULFATE (2.5 MG/3ML) 0.083% IN NEBU
2.5000 mg | INHALATION_SOLUTION | Freq: Four times a day (QID) | RESPIRATORY_TRACT | Status: DC | PRN
  Administered 2022-03-02: 2.5 mg via RESPIRATORY_TRACT
  Filled 2022-03-01: qty 3

## 2022-03-01 MED ORDER — ONDANSETRON HCL 4 MG/2ML IJ SOLN: 4.0000 mg | Freq: Four times a day (QID) | INTRAMUSCULAR | Status: DC | PRN

## 2022-03-01 MED ORDER — LORATADINE 10 MG PO TABS: 10.0000 mg | ORAL_TABLET | Freq: Every day | ORAL | Status: DC | PRN

## 2022-03-01 MED ORDER — PANTOPRAZOLE INFUSION (NEW) - SIMPLE MED
8.0000 mg/h | INTRAVENOUS | Status: DC
  Administered 2022-03-01 (×2): 8 mg/h via INTRAVENOUS
  Filled 2022-03-01 (×3): qty 100

## 2022-03-01 MED ORDER — SODIUM CHLORIDE 0.9% FLUSH
3.0000 mL | Freq: Two times a day (BID) | INTRAVENOUS | Status: DC
  Administered 2022-03-01 – 2022-03-08 (×13): 3 mL via INTRAVENOUS

## 2022-03-01 MED ORDER — FINASTERIDE 5 MG PO TABS
5.0000 mg | ORAL_TABLET | Freq: Every day | ORAL | Status: DC
  Administered 2022-03-02 – 2022-03-06 (×5): 5 mg via ORAL
  Filled 2022-03-01 (×5): qty 1

## 2022-03-01 MED ORDER — SODIUM CHLORIDE 0.9 % IV SOLN: INTRAVENOUS | Status: DC

## 2022-03-01 MED ORDER — ACETAMINOPHEN 650 MG RE SUPP: 650.0000 mg | Freq: Four times a day (QID) | RECTAL | Status: DC | PRN

## 2022-03-01 NOTE — ED Triage Notes (Signed)
Patient here with complaint of sudden onset of chest pressure, fatigue, and dizziness that started Thursday afternoon after work and has persistent since then. Patient also reports cough and an increased in his CBG's that are usually around 100 but have been 160-200 recently. Patient is alert, oriented, and in no apparent distress at this time. BP 83/69 in triage.

## 2022-03-01 NOTE — H&P (Signed)
History and Physical    Patient: Dominic Kane JQG:[id number] DOB: 1940/12/29 DOA: 03/01/2022 DOS: the patient was seen and examined on 03/01/2022 PCP: Jinny Sanders, MD  Patient coming from: Home  Chief Complaint:  Chief Complaint  Patient presents with   Chest Pain   HPI: Dominic Kane is a 81 y.o. 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  He actually came in as his blood sugars have been sitting and was elevated up to 220.  Normally his blood sugars are around 106.  Patient reports that he had red blood per rectum initially, but over the last week stools turned to black.  He is on 81 mg aspirin a takes daily but denies any other NSAIDs.  Med reconciliation also noted the patient takes Excedrin migraine intermittently as well.  He denies any significant lower extremity swelling or abdominal pain with his symptoms.  His last colonoscopy was in 04/2014 with Dr. Carlean Purl revealed three 2 mm polyps which were removed and severe diverticulosis left greater than right colon.   Upon admission into the emergency department patient was seen to be afebrile with tachypnea, and blood pressure as low as 87/42.  Labs noted hemoglobin 9.8, BUN 30, creatinine 1.27, and glucose 156.  Stool were reported to be melanotic in color and positive for blood. [facility] was consulted.  Patient was typed and screened.  He was ordered to be transfused 1 unit of PRBCs and started on Protonix drip.   Review of Systems: As mentioned in the history of present illness. All other systems reviewed and are negative. 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 15 yrs ago x1   Hypertrophy of prostate without urinary obstruction and other lower urinary tract symptoms (LUTS)    Lipoprotein deficiencies    Other and unspecified hyperlipidemia    Other primary cardiomyopathies    Pain in joint, lower leg    Personal history of colonic polyps    Personal history of other diseases of digestive system    Pes anserinus tendinitis or bursitis    left shoulder remains an issue   Sinoatrial node dysfunction ([place])    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 Nevada Crane   CATARACT EXTRACTION, BILATERAL     COLONOSCOPY W/ POLYPECTOMY     DOBUTAMINE STRESS ECHO  1/07   Lateral hypokinesis but no ischemia   ESOPHAGOGASTRODUODENOSCOPY  11/07   Schatzki's ring, non bleeding erosive gastropathy [facility]   KNEE SURGERY Left 1947   LYMPH NODE DISSECTION N/A 08/05/2016   Procedure: LIMITED LYMPH NODE DISSECTION;  Surgeon: Armandina Gemma, MD;  Location: [facility]  ORS;  Service: General;  Laterality: N/A;   Pulmonary functioning tests  1. 2003  2. 2005   1. Diminished lung capacity  2. Stable   THYROIDECTOMY N/A 08/05/2016   Procedure: TOTAL THYROIDECTOMY;  Surgeon: Armandina Gemma, MD;  Location: [facility] ORS;  Service: General;  Laterality: N/A;   TONSILLECTOMY     TOTAL KNEE ARTHROPLASTY Left 03/18/2016   Procedure: LEFT TOTAL KNEE ARTHROPLASTY;  Surgeon: Paralee Cancel, MD;  Location: [facility] ORS;  Service: Orthopedics;  Laterality: Left;   Social History:  reports that he quit smoking about 41 years ago. His smoking use included cigarettes. He has never used smokeless tobacco. He reports current alcohol use. He reports that he does not use drugs.  Allergies  Allergen Reactions   Codeine Nausea Only   Sulfonamide Derivatives Hives and Nausea And Vomiting    Family History  Problem Relation Age of Onset   Heart failure Mother    Coronary artery disease Mother    Stroke Mother    Hypothyroidism Mother    Cancer Father        LUNG   Cancer Sister        BREAST   Heart failure Maternal Grandmother    Hypothyroidism Daughter    Colon cancer Neg Hx     Prior to Admission medications   Medication Sig Start Date End Date Taking? Authorizing Provider  aspirin EC 81 MG tablet Take 81 mg by mouth daily.    [provider]  Blood Glucose Monitoring Suppl (ONE TOUCH ULTRA 2) w/Device KIT by Does not apply route. Use to check blood sugar every other day    [provider]  cetirizine (ZYRTEC) 10 MG tablet Take 10 mg by mouth daily.    [provider]  finasteride (PROSCAR) 5 MG tablet Take 1 tablet (5 mg total) by mouth daily. 05/29/21   Bedsole, Amy E, MD  furosemide (LASIX) 40 MG tablet TAKE 1 TABLET BY MOUTH  DAILY (DUE FOR AN APPT IN  APRIL AND CAN GET YEARLY  SUPPLY AT THAT VISIT.   PLEASE CALL TO SCHEDULE) 11/01/20   Burnell Blanks, MD  levothyroxine (SYNTHROID) 150 MCG tablet TAKE 1 TABLET BY MOUTH EVERY DAY BEFORE BREAKFAST  06/25/21   Renato Shin, MD  melatonin 3 MG TABS tablet Take 3 mg by mouth at bedtime.    [provider]  Multiple Vitamins-Minerals (PRESERVISION AREDS 2 PO) Take by mouth in the morning and at bedtime.    [provider]  omeprazole (PRILOSEC) 20 MG capsule Take 1 capsule (20 mg total) by mouth daily. 10/29/21   Jinny Sanders, MD  OneTouch Delica Lancets 10C MISC by Does not apply route. Use to check blood sugar every other day    [provider]  [facility] ULTRA test strip USE TO [place] E11.9 11/16/20   Bedsole,  Amy E, MD  PRESCRIPTION MEDICATION every 6 (six) months. *Antibiotic Injection in Eye*    [provider]  ramipril (ALTACE) 2.5 MG capsule TAKE 1 CAPSULE BY MOUTH EVERY DAY 12/31/21   Burnell Blanks, MD  rosuvastatin (CRESTOR) 10 MG tablet Take 1 tablet (10 mg total) by mouth daily. 12/03/21   Burnell Blanks, MD  terazosin (HYTRIN) 10 MG capsule Take 1 capsule (10 mg total) by mouth daily. 01/07/22   Jinny Sanders, MD    Physical Exam: Vitals:   03/01/22 1333 03/01/22 1345 03/01/22 1400 03/01/22 1427  BP: 108/79 115/64 (!) 86/67 (!) 85/55  Pulse: 82   72  Resp: _0 (!) 24  Temp:      SpO2: 97%   95%   Constitutional: Elderly currently in no acute distress. Eyes: PERRL, lids and conjunctivae normal ENMT: Mucous membranes are moist.  Hard of hearing Neck: normal, supple  Respiratory: Clear to consultation bilaterally without significant wheezes or rhonchi appreciated.  Patient O2 saturation currently maintained on room air. Cardiovascular: Regular rate and rhythm, no murmurs / rubs / gallops.  Trace to +1 pitting edema of the bilateral lower extremities.    Abdomen: no tenderness, no masses palpated.  Bowel sounds positive.  Musculoskeletal: no clubbing / cyanosis. No joint deformity upper and lower extremities. Good ROM, no contractures. Normal muscle tone.  Skin: no rashes, lesions, ulcers. No  induration Neurologic: CN 2-12 grossly intact. Sensation intact, DTR normal. Strength 5/5 in all 4.  Psychiatric: Normal judgment and insight. Alert and oriented x 3. Normal mood.   Data Reviewed:  EKG revealed normal sinus rhythm with a right bundle branch block at 80 bpm reviewed labs, imaging, and pertinent records as noted above in HPI  Assessment and Plan:  Acute blood loss anemia secondary to GI bleed Patient presents with complaints of fatigue and shortness of breath with reports of dark stools the last couple of days.  Stool guaiacs positive for blood.  Hemoglobin 9.8 with elevated BUN.  Patient had been started protonix drip and ordered to be transfused 1 unit of PRBCs due to initial hypotension. -Admit to a progressive bed -Monitor intake and output -Clear liquid diet  -Serial monitoring of H&H -Continue with transfusion of 1 unit of packed red blood cells -Continue Protonix drip -Hold aspirin -[facility] consulted, we will follow-up for any further recommendation  Transient hypotension Acute.  Blood pressures documented as low as 82/47.  Home blood pressure regimen includes furosemide 40 mg daily, terazosin 10 mg daily, ramipril 2.5 mg daily. -Continue goal MAP greater than 65 -Hold home blood pressure regimen  Chest discomfort EKG without significant ischemic changes.  Initial high-sensitivity troponin 7.    -Continue to cardiac troponin  Systolic CHF Chronic.  Patient with trace to 1+ pitting edema bilaterally lower extremities.  Last EF noted to be 30 to 35% with grade 1 diastolic dysfunction back in 2021.  CXR was clear.  Patient does not appear grossly fluid overloaded at this time. -Strict I&O's and daily weights -Check BNP -Consider Lasix if needed or signs of fluid overload  Controlled diabetes mellitus type 2, without long-term use of insulin At home blood sugars were reported to be elevated up to 220.  Last available hemoglobin A1c 6.8 on 10/22/2021.  Patient  had been relatively diet-controlled.  Hypothyroidism History of papillary thyroid cancer Status post total thyroidectomy for papillary thyroid cancer back in 2018 by Dr. Harlow Asa. Last TSH 4.48 in 07/22/2021. -Add on  TSH -Continue levothyroxine  Hyperlipidemia -Continue Crestor  BPH -Continue Proscar   DVT prophylaxis: SCDs Advance Care Planning:   Code Status: Full Code   Consults: [facility]  Family Communication: Wife updated at bedside  Severity of Illness: The appropriate patient status for this patient is OBSERVATION. Observation status is judged to be reasonable and necessary in order to provide the required intensity of service to ensure the patient's safety. The patient's presenting symptoms, physical exam findings, and initial radiographic and laboratory data in the context of their medical condition is felt to place them at decreased risk for further clinical deterioration. Furthermore, it is anticipated that the patient will be medically stable for discharge from the hospital within 2 midnights of admission.   Author: Norval Morton, MD 03/01/2022 2:47 PM  For on call review [email or web address].

## 2022-03-01 NOTE — ED Provider Notes (Signed)
[facility] EMERGENCY DEPARTMENT Provider Note   CSN: [id number] Arrival date & time: 03/01/22  1235     History  Chief Complaint  Patient presents with   Chest Pain    Dominic Kane is a 81 y.o. male.  Patient here with shortness of breath, chest heaviness especially with activities.  Patient with history of CAD, CHF.  Denies any cough or sputum production.  He has noticed dark stools the last several days.  He is on aspirin but no other blood thinners.  No heavy drinking history.  Denies any abdominal pain, nausea or vomiting.  He just has not felt the same in the last 2 days with increased fatigue as well.  Denies any numbness or chills.  He was concerned maybe for blood sugar issue.  The history is provided by the patient.       Home Medications Prior to Admission medications   Medication Sig Start Date End Date Taking? Authorizing Provider  aspirin EC 81 MG tablet Take 81 mg by mouth daily.    [provider]  Blood Glucose Monitoring Suppl (ONE TOUCH ULTRA 2) w/Device KIT by Does not apply route. Use to check blood sugar every other day    [provider]  cetirizine (ZYRTEC) 10 MG tablet Take 10 mg by mouth daily.    [provider]  finasteride (PROSCAR) 5 MG tablet Take 1 tablet (5 mg total) by mouth daily. 05/29/21   Bedsole, Amy E, MD  furosemide (LASIX) 40 MG tablet TAKE 1 TABLET BY MOUTH  DAILY (DUE FOR AN APPT IN  APRIL AND CAN GET YEARLY  SUPPLY AT THAT VISIT.   PLEASE CALL TO SCHEDULE) 11/01/20   Burnell Blanks, MD  levothyroxine (SYNTHROID) 150 MCG tablet TAKE 1 TABLET BY MOUTH EVERY DAY BEFORE BREAKFAST 06/25/21   Renato Shin, MD  melatonin 3 MG TABS tablet Take 3 mg by mouth at bedtime.    [provider]  Multiple Vitamins-Minerals (PRESERVISION AREDS 2 PO) Take by mouth in the morning and at bedtime.    [provider]  omeprazole (PRILOSEC) 20 MG capsule Take 1 capsule (20 mg total) by  mouth daily. 10/29/21   Jinny Sanders, MD  OneTouch Delica Lancets 35H MISC by Does not apply route. Use to check blood sugar every other day    [provider]  ONETOUCH ULTRA test strip USE TO CHECK BLOOD SUGAR EVERY OTHER DAY E11.9 11/16/20   Jinny Sanders, MD  PRESCRIPTION MEDICATION every 6 (six) months. *Antibiotic Injection in Eye*    [provider]  ramipril (ALTACE) 2.5 MG capsule TAKE 1 CAPSULE BY MOUTH EVERY DAY 12/31/21   Burnell Blanks, MD  rosuvastatin (CRESTOR) 10 MG tablet Take 1 tablet (10 mg total) by mouth daily. 12/03/21   Burnell Blanks, MD  terazosin (HYTRIN) 10 MG capsule Take 1 capsule (10 mg total) by mouth daily. 01/07/22   Bedsole, Amy E, MD      Allergies    Codeine and Sulfonamide derivatives    Review of Systems   Review of Systems  Physical Exam Updated Vital Signs BP (!) 85/55   Pulse 72   Temp (!) 97.5 F (36.4 C)   Resp (!) 24   SpO2 95%  Physical Exam Vitals and nursing note reviewed.  Constitutional:      General: He is not in acute distress.    Appearance: He is well-developed.  HENT:  Head: Normocephalic and atraumatic.  Eyes:     Conjunctiva/sclera: Conjunctivae normal.  Cardiovascular:     Rate and Rhythm: Normal rate and regular rhythm.     Heart sounds: No murmur heard. Pulmonary:     Effort: Pulmonary effort is normal. No respiratory distress.     Breath sounds: Normal breath sounds.  Abdominal:     Palpations: Abdomen is soft.     Tenderness: There is no abdominal tenderness.  Genitourinary:    Rectum: Guaiac result positive.     Comments: Melena on exam  Musculoskeletal:        General: No swelling.     Cervical back: Neck supple.  Skin:    General: Skin is warm and dry.     Capillary Refill: Capillary refill takes less than 2 seconds.  Neurological:     Mental Status: He is alert.  Psychiatric:        Mood and Affect: Mood normal.     ED Results / Procedures / Treatments    Labs (all labs ordered are listed, but only abnormal results are displayed) Labs Reviewed  BASIC METABOLIC PANEL - Abnormal; Notable for the following components:      Result Value   CO2 21 (*)    Glucose, Bld 156 (*)    BUN 30 (*)    Creatinine, Ser 1.27 (*)    GFR, Estimated 57 (*)    All other components within normal limits  CBC - Abnormal; Notable for the following components:   RBC 3.25 (*)    Hemoglobin 9.8 (*)    HCT 30.6 (*)    All other components within normal limits  POC OCCULT BLOOD, ED - Abnormal; Notable for the following components:   Fecal Occult Bld POSITIVE (*)    All other components within normal limits  HEPATIC FUNCTION PANEL  PREPARE FRESH FROZEN PLASMA  TYPE AND SCREEN  TROPONIN I (HIGH SENSITIVITY)  TROPONIN I (HIGH SENSITIVITY)    EKG EKG Interpretation  Date/Time:  Saturday March 01 2022 12:50:08 EST Ventricular Rate:  80 PR Interval:  184 QRS Duration: 128 QT Interval:  426 QTC Calculation: 491 R Axis:   -49 Text Interpretation: Normal sinus rhythm Right bundle branch block Left anterior fascicular block Septal infarct , age undetermined Abnormal ECG When compared with ECG of 29-Dec-2011 10:04, PREVIOUS ECG IS PRESENT Confirmed by Lennice Sites [phone]) on 03/01/2022 1:36:24 PM  Radiology DG Chest Portable 1 View  Result Date: 03/01/2022 CLINICAL DATA:  Chest pain EXAM: PORTABLE CHEST 1 VIEW COMPARISON:  07/30/16 CXR FINDINGS: The heart size and mediastinal contours are within normal limits. Both lungs are clear. The visualized skeletal structures are unremarkable. Aortic atherosclerotic calcifications. IMPRESSION: No active disease. Electronically Signed   By: Marin Roberts M.D.   On: 03/01/2022 14:06    Procedures .Critical Care  Performed by: Lennice Sites, DO Authorized by: Lennice Sites, DO   Critical care provider statement:    Critical care time (minutes):  40   Critical care was necessary to treat or prevent imminent or  life-threatening deterioration of the following conditions:  Circulatory failure   Critical care was time spent personally by me on the following activities:  Blood draw for specimens, development of treatment plan with patient or surrogate, discussions with consultants, discussions with primary provider, evaluation of patient's response to treatment, examination of patient, obtaining history from patient or surrogate, ordering and performing treatments and interventions, ordering and review of laboratory studies, ordering and review of  radiographic studies, pulse oximetry, re-evaluation of patient's condition and review of old charts   Care discussed with: admitting provider       Medications Ordered in ED Medications  pantoprazole (PROTONIX) 80 mg /NS 100 mL IVPB (80 mg Intravenous New Bag/Given 03/01/22 1430)  pantoprozole (PROTONIX) 80 mg /NS 100 mL infusion (has no administration in time range)  pantoprazole (PROTONIX) injection 40 mg (has no administration in time range)  0.9 %  sodium chloride infusion (has no administration in time range)    ED Course/ Medical Decision Making/ A&P                           Medical Decision Making Amount and/or Complexity of Data Reviewed Labs: ordered. Radiology: ordered.  Risk Prescription drug management. Decision regarding hospitalization.   Ryler Laskowski Prevost is here with weakness, shortness of breath, chest pain, dark stools.  Has gross melena on exam.  He has been having some fatigue and generalized weakness the last day or 2 with some chest heaviness.  EKG per my review interpretation shows sinus rhythm.  No obvious ischemic changes.  Differential diagnosis likely GI bleed given gross melena.  He is on aspirin but no other blood thinners.  Blood pressure upon arrival was in the 74Y systolic.  He has had some blood pressures in the low 100s but throughout my care he had some blood pressures in the 80s.  His hemoglobin per my review and  interpretation is 9.8.  Given concern for GI bleed with low blood pressures we will transfuse him with 1 unit of packed red blood cells.  I have talked with Dr. Silverio Decamp with [facility] who will consult.  Patient started on IV Protonix infusion and bolus.  Troponin is normal.  Lab work otherwise unremarkable.  Chest x-ray per my review and interpretation shows no obvious pneumonia or pneumothorax.  Ultimately I am concern for upper GI bleed.  Patient is well-appearing.  Is not tachycardic.  Has had some borderline hypotension and we will type and transfuse him 1 unit of packed red blood cells for further stabilization.  To be admitted to medicine for further care.  This chart was dictated using voice recognition software.  Despite best efforts to proofread,  errors can occur which can change the documentation meaning.         Final Clinical Impression(s) / ED Diagnoses Final diagnoses:  Gastrointestinal hemorrhage, unspecified gastrointestinal hemorrhage type    Rx / DC Orders ED Discharge Orders     None         Lennice Sites, DO 03/01/22 1451

## 2022-03-01 NOTE — H&P (View-Only) (Signed)
Referring Provider: Dr. Ronnald Nian, EDP Primary Care Physician:  Jinny Sanders, MD Primary Gastroenterologist:  Dr. Carlean Purl  Reason for Consultation:  Black, heme positive stool  HPI: Dominic Kane is a 81 y.o. male with past medical history of diabetes and some other medical problems as listed below.  He actually came to the emergency department today for evaluation after his blood sugars kept increasing up to 220 which is abnormal for him.  While he was here he mentioned that he started seeing black stools about a week ago.  Was found to be Hemoccult positive.  He says his last bowel movement was actually 2 to 3 days ago now.  Hemoglobin 9.8 g, nothing recent for comparison.  Never had any previous issues similar to this.  Never had an EGD in the past.  Denies NSAID use.  Does take an 81 mg aspirin daily.  Takes omeprazole 20 mg daily.  Reports a little nausea, but no vomiting.  No abdominal pain.  Appetite has been a little decreased recently.  He says the bleeding actually started out as a dark red color and then progressed to black.  Colonoscopy 04/2014:  ENDOSCOPIC IMPRESSION: 1) Three 2 mm polyps removed with cold forceps and sent to pathology. cecum, ascending and transverse locations.  Tubular adenomas.  No repeat due to age. 2) Severe diverticulosis - left>right colon. 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   Displacement of lumbar  intervertebral disc without myelopathy    Diverticulosis of colon (without mention of hemorrhage)    Dysfunction of eustachian tube    Hard of hearing"bilateral hearing aids"   Enlarged prostate    GERD (gastroesophageal reflux disease)    Headache(784.0)    History of kidney stones    past hx. 15 yrs ago x1   Hypertrophy of prostate without urinary obstruction and other lower urinary tract symptoms (LUTS)    Lipoprotein deficiencies    Other and unspecified hyperlipidemia    Other primary cardiomyopathies    Pain in joint, lower leg    Personal history of colonic polyps    Personal history of other diseases of digestive system    Pes anserinus tendinitis or bursitis    left shoulder remains an issue   Sinoatrial node dysfunction ([place])    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 [place] Crane   CATARACT EXTRACTION, BILATERAL     COLONOSCOPY W/ POLYPECTOMY     DOBUTAMINE STRESS ECHO  1/07   Lateral hypokinesis but no ischemia   ESOPHAGOGASTRODUODENOSCOPY  11/07   Schatzki's ring, non bleeding erosive gastropathy [place]   KNEE SURGERY Left 1947   LYMPH NODE DISSECTION N/A 08/05/2016   Procedure: LIMITED LYMPH NODE DISSECTION;  Surgeon: Armandina Gemma, MD;  Location: [facility];  Service: General;  Laterality: N/A;   Pulmonary functioning tests  1. 2003  2. 2005   1. Diminished lung capacity  2. Stable   THYROIDECTOMY N/A 08/05/2016   Procedure: TOTAL THYROIDECTOMY;  Surgeon: Armandina Gemma, MD;  Location: [facility];  Service: General;  Laterality: N/A;   TONSILLECTOMY     TOTAL KNEE ARTHROPLASTY Left 03/18/2016   Procedure: LEFT TOTAL KNEE ARTHROPLASTY;  Surgeon: Paralee Cancel, MD;  Location: [facility];  Service: Orthopedics;  Laterality: Left;    Prior to Admission medications   Medication Sig Start Date End Date Taking? Authorizing Provider  aspirin EC 81 MG tablet Take 81 mg by mouth daily.    [provider]  Blood Glucose Monitoring Suppl (ONE TOUCH ULTRA 2) w/Device KIT by Does not apply route. Use to check blood sugar every other day    [provider]  cetirizine (ZYRTEC) 10 MG tablet Take 10 mg by mouth daily.    [provider]  finasteride (PROSCAR) 5 MG tablet Take 1 tablet (5 mg total) by mouth daily. 05/29/21   Bedsole, Amy E, MD  furosemide (LASIX) 40 MG tablet TAKE 1 TABLET BY MOUTH  DAILY (DUE FOR AN APPT IN  APRIL AND CAN GET YEARLY  SUPPLY AT THAT VISIT.   PLEASE CALL TO SCHEDULE) 11/01/20   Burnell Blanks, MD  levothyroxine (SYNTHROID) 150 MCG tablet TAKE 1 TABLET BY MOUTH EVERY DAY BEFORE BREAKFAST 06/25/21   Renato Shin, MD  melatonin 3 MG TABS tablet Take 3 mg by mouth at bedtime.    [provider]  Multiple Vitamins-Minerals (PRESERVISION AREDS 2 PO) Take by mouth in the morning and at bedtime.    [provider]  omeprazole (PRILOSEC) 20 MG capsule Take 1 capsule (20 mg total) by mouth daily. 10/29/21   Jinny Sanders, MD  OneTouch Delica Lancets 01X MISC by Does not apply route. Use to check blood sugar every other day    [provider]  ONETOUCH ULTRA test strip USE TO CHECK BLOOD SUGAR EVERY OTHER DAY E11.9 11/16/20   Jinny Sanders, MD  PRESCRIPTION MEDICATION every 6 (six) months. *Antibiotic Injection in Eye*    [provider]  ramipril (ALTACE) 2.5 MG capsule TAKE 1 CAPSULE BY MOUTH EVERY DAY 12/31/21   Burnell Blanks, MD  rosuvastatin (CRESTOR) 10 MG tablet Take 1 tablet (10 mg total) by mouth daily. 12/03/21   Burnell Blanks, MD  terazosin (HYTRIN) 10 MG capsule Take 1 capsule (10 mg total) by mouth daily. 01/07/22   Jinny Sanders, MD    Current  Facility-Administered Medications  Medication Dose Route Frequency Provider Last Rate Last Admin   pantoprazole (PROTONIX) 80 mg /NS 100 mL IVPB  80 mg Intravenous Once Curatolo, Adam, DO 300 mL/hr at 03/01/22 1430 80 mg at 03/01/22 1430   [START ON 03/05/2022] pantoprazole (PROTONIX) injection 40 mg  40 mg Intravenous Q12H Curatolo, Adam, DO       pantoprozole (PROTONIX) 80 mg /NS 100 mL infusion  8 mg/hr Intravenous Continuous Curatolo, Adam, DO       Current Outpatient Medications  Medication Sig Dispense Refill   aspirin EC 81 MG tablet Take 81 mg by mouth daily.  Blood Glucose Monitoring Suppl (ONE TOUCH ULTRA 2) w/Device KIT by Does not apply route. Use to check blood sugar every other day     cetirizine (ZYRTEC) 10 MG tablet Take 10 mg by mouth daily.     finasteride (PROSCAR) 5 MG tablet Take 1 tablet (5 mg total) by mouth daily. 90 tablet 3   furosemide (LASIX) 40 MG tablet TAKE 1 TABLET BY MOUTH  DAILY (DUE FOR AN APPT IN  APRIL AND CAN GET YEARLY  SUPPLY AT THAT VISIT.   PLEASE CALL TO SCHEDULE) 90 tablet 3   levothyroxine (SYNTHROID) 150 MCG tablet TAKE 1 TABLET BY MOUTH EVERY DAY BEFORE BREAKFAST 90 tablet 3   melatonin 3 MG TABS tablet Take 3 mg by mouth at bedtime.     Multiple Vitamins-Minerals (PRESERVISION AREDS 2 PO) Take by mouth in the morning and at bedtime.     omeprazole (PRILOSEC) 20 MG capsule Take 1 capsule (20 mg total) by mouth daily. 90 capsule 3   OneTouch Delica Lancets 17P MISC by Does not apply route. Use to check blood sugar every other day     ONETOUCH ULTRA test strip USE TO CHECK BLOOD SUGAR EVERY OTHER DAY E11.9 50 strip 3   PRESCRIPTION MEDICATION every 6 (six) months. *Antibiotic Injection in Eye*     ramipril (ALTACE) 2.5 MG capsule TAKE 1 CAPSULE BY MOUTH EVERY DAY 90 capsule 0   rosuvastatin (CRESTOR) 10 MG tablet Take 1 tablet (10 mg total) by mouth daily. 90 tablet 1   terazosin (HYTRIN) 10 MG capsule Take 1 capsule (10 mg total) by mouth daily.  90 capsule 3    Allergies as of 03/01/2022 - Review Complete 03/01/2022  Allergen Reaction Noted   Codeine Nausea Only 12/23/2006   Sulfonamide derivatives Hives 12/23/2006    Family History  Problem Relation Age of Onset   Heart failure Mother    Coronary artery disease Mother    Stroke Mother    Hypothyroidism Mother    Cancer Father        LUNG   Cancer Sister        BREAST   Heart failure Maternal Grandmother    Hypothyroidism Daughter    Colon cancer Neg Hx     Social History   Socioeconomic History   Marital status: Married    Spouse name: Not on file   Number of children: 1   Years of education: Not on file   Highest education level: Not on file  Occupational History   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  Tobacco Use   Smoking status: Former    Types: Cigarettes    Quit date: 07/03/1980    Years since quitting: 41.6   Smokeless tobacco: Never  Vaping Use   Vaping Use: Never used  Substance and Sexual Activity   Alcohol use: Yes    Alcohol/week: 0.0 standard drinks of alcohol    Comment: occassionally   Drug use: No   Sexual activity: Not on file  Other Topics Concern   Not on file  Social History Narrative   Married 1 daughter   Retired from 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   2 caffeinated drinks a day no alcohol tobacco or drug use    former smoker   Social Determinants of Radio broadcast assistant Strain: Low Risk  (04/25/2021)   Overall Financial Resource Strain (CARDIA)    Difficulty of Paying Living  Expenses: Not hard at all  Food Insecurity: No Food Insecurity (04/25/2021)   Hunger Vital Sign    Worried About Running Out of Food in the Last Year: Never true    Ran Out of Food in the Last Year: Never true  Transportation Needs: No Transportation Needs (04/25/2021)   PRAPARE - Hydrologist (Medical): No    Lack of Transportation (Non-Medical): No  Physical  Activity: Unknown (04/25/2021)   Exercise Vital Sign    Days of Exercise per Week: 5 days    Minutes of Exercise per Session: Not on file  Stress: No Stress Concern Present (04/25/2021)   [place]    Feeling of Stress : Not at all  Social Connections: Moderately Integrated (04/25/2021)   Social Connection and Isolation Panel [NHANES]    Frequency of Communication with Friends and Family: More than three times a week    Frequency of Social Gatherings with Friends and Family: More than three times a week    Attends Religious Services: More than 4 times per year    Active Member of [employer] or Organizations: No    Attends Archivist Meetings: Never    Marital Status: Married  Human resources officer Violence: Not At Risk (04/25/2021)   Humiliation, Afraid, Rape, and Kick questionnaire    Fear of Current or Ex-Partner: No    Emotionally Abused: No    Physically Abused: No    Sexually Abused: No    Review of Systems: ROS is O/W negative except as mentioned in HPI.  Physical Exam: Vital signs in last 24 hours: Temp:  [97.5 F (36.4 C)] 97.5 F (36.4 C) (12/09 1248) Pulse Rate:  [71-91] 72 (12/09 1427) Resp:  [16-24] 24 (12/09 1427) BP: (81-115)/(55-79) 85/55 (12/09 1427) SpO2:  [94 %-97 %] 95 % (12/09 1427)   General:  Alert, Well-developed, well-nourished, pleasant and cooperative in NAD Head:  Normocephalic and atraumatic. Eyes:  Sclera clear, no icterus.  Conjunctiva pink. Ears:  Normal auditory acuity. Mouth:  No deformity or lesions.   Lungs:  Clear throughout to auscultation.  No wheezes, crackles, or rhonchi.  Heart:  Regular rate and rhythm; no murmurs, clicks, rubs, or gallops. Abdomen:  Soft, non-distended.  BS present.  Non-tender.   Rectal:  Deferred.  Heme positive per EDP.  Msk:  Symmetrical without gross deformities. Pulses:  Normal pulses noted. Extremities:  Without clubbing or edema. Neurologic:   Alert and oriented x 4;  grossly normal neurologically. Skin:  Intact without significant lesions or rashes. Psych:  Alert and cooperative. Normal mood and affect.  Lab Results: Recent Labs    03/01/22 1229  WBC 8.3  HGB 9.8*  HCT 30.6*  PLT 189   BMET Recent Labs    03/01/22 1229  NA 137  K 4.2  CL 106  CO2 21*  GLUCOSE 156*  BUN 30*  CREATININE 1.27*  CALCIUM 9.1   Studies/Results: DG Chest Portable 1 View  Result Date: 03/01/2022 CLINICAL DATA:  Chest pain EXAM: PORTABLE CHEST 1 VIEW COMPARISON:  07/30/16 CXR FINDINGS: The heart size and mediastinal contours are within normal limits. Both lungs are clear. The visualized skeletal structures are unremarkable. Aortic atherosclerotic calcifications. IMPRESSION: No active disease. Electronically Signed   By: Marin Roberts M.D.   On: 03/01/2022 14:06    IMPRESSION:  *Anemia with Hgb of 9.8 grams, reports of black stools and heme positive.  On ASA 81 mg  daily.  No BM now in the past 2 to 3 days.  Rule out ulcer, esophagitis, etc.  Is on omeprazole 20 mg daily at home.  PLAN: -Agree with pantoprazole 40 mg IV. -Monitor Hgb and transfuse prn. -EGD tomorrow, 12/10.  Laban Emperor. Zehr  03/01/2022, 2:34 PM    Attending physician's note  I have taken a history, reviewed the chart and examined the patient. I performed a substantive portion of this encounter, including complete performance of at least one of the key components, in conjunction with the APP. I agree with the APP's note, impression and recommendations.   81 year old very pleasant gentleman with history of diabetes presented with melena and decline in hemoglobin from baseline to 9 He is on daily aspirin 81 mg otherwise denies frequent NSAID use Never had EGD, last colonoscopy was in 2016 No family history of GI malignancy  Plan to proceed with EGD to further evaluate melena and potential source of upper GI bleed PPI twice daily The risks and benefits as well as  alternatives of endoscopic procedure(s) have been discussed and reviewed. All questions answered. The patient agrees to proceed.    Damaris Hippo , MD [phone]

## 2022-03-01 NOTE — Consult Note (Cosign Needed)
Referring Provider: Dr. Ronnald Nian, EDP Primary Care Physician:  Jinny Sanders, MD Primary Gastroenterologist:  Dr. Carlean Purl  Reason for Consultation:  Black, heme positive stool  HPI: Dominic Kane is a 81 y.o. male with past medical history of diabetes and some other medical problems as listed below.  He actually came to the emergency department today for evaluation after his blood sugars kept increasing up to 220 which is abnormal for him.  While he was here he mentioned that he started seeing black stools about a week ago.  Was found to be Hemoccult positive.  He says his last bowel movement was actually 2 to 3 days ago now.  Hemoglobin 9.8 g, nothing recent for comparison.  Never had any previous issues similar to this.  Never had an EGD in the past.  Denies NSAID use.  Does take an 81 mg aspirin daily.  Takes omeprazole 20 mg daily.  Reports a little nausea, but no vomiting.  No abdominal pain.  Appetite has been a little decreased recently.  He says the bleeding actually started out as a dark red color and then progressed to black.  Colonoscopy 04/2014:  ENDOSCOPIC IMPRESSION: 1) Three 2 mm polyps removed with cold forceps and sent to pathology. cecum, ascending and transverse locations.  Tubular adenomas.  No repeat due to age. 2) Severe diverticulosis - left>right colon. 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   Displacement of lumbar  intervertebral disc without myelopathy    Diverticulosis of colon (without mention of hemorrhage)    Dysfunction of eustachian tube    Hard of hearing"bilateral hearing aids"   Enlarged prostate    GERD (gastroesophageal reflux disease)    Headache(784.0)    History of kidney stones    past hx. 15 yrs ago x1   Hypertrophy of prostate without urinary obstruction and other lower urinary tract symptoms (LUTS)    Lipoprotein deficiencies    Other and unspecified hyperlipidemia    Other primary cardiomyopathies    Pain in joint, lower leg    Personal history of colonic polyps    Personal history of other diseases of digestive system    Pes anserinus tendinitis or bursitis    left shoulder remains an issue   Sinoatrial node dysfunction ([place])    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 Nevada Crane   CATARACT EXTRACTION, BILATERAL     COLONOSCOPY W/ POLYPECTOMY     DOBUTAMINE STRESS ECHO  1/07   Lateral hypokinesis but no ischemia   ESOPHAGOGASTRODUODENOSCOPY  11/07   Schatzki's ring, non bleeding erosive gastropathy Charlotte Jansen   KNEE SURGERY Left 1947   LYMPH NODE DISSECTION N/A 08/05/2016   Procedure: LIMITED LYMPH NODE DISSECTION;  Surgeon: Armandina Gemma, MD;  Location: [facility];  Service: General;  Laterality: N/A;   Pulmonary functioning tests  1. 2003  2. 2005   1. Diminished lung capacity  2. Stable   THYROIDECTOMY N/A 08/05/2016   Procedure: TOTAL THYROIDECTOMY;  Surgeon: Armandina Gemma, MD;  Location: [facility];  Service: General;  Laterality: N/A;   TONSILLECTOMY     TOTAL KNEE ARTHROPLASTY Left 03/18/2016   Procedure: LEFT TOTAL KNEE ARTHROPLASTY;  Surgeon: Paralee Cancel, MD;  Location: [facility];  Service: Orthopedics;  Laterality: Left;    Prior to Admission medications   Medication Sig Start Date End Date Taking? Authorizing Provider  aspirin EC 81 MG tablet Take 81 mg by mouth daily.    [provider]  Blood Glucose Monitoring Suppl (ONE TOUCH ULTRA 2) w/Device KIT by Does not apply route. Use to check blood sugar every other day    [provider]  cetirizine (ZYRTEC) 10 MG tablet Take 10 mg by mouth daily.    [provider]  finasteride (PROSCAR) 5 MG tablet Take 1 tablet (5 mg total) by mouth daily. 05/29/21   Bedsole, Amy E, MD  furosemide (LASIX) 40 MG tablet TAKE 1 TABLET BY MOUTH  DAILY (DUE FOR AN APPT IN  APRIL AND CAN GET YEARLY  SUPPLY AT THAT VISIT.   PLEASE CALL TO SCHEDULE) 11/01/20   Burnell Blanks, MD  levothyroxine (SYNTHROID) 150 MCG tablet TAKE 1 TABLET BY MOUTH EVERY DAY BEFORE BREAKFAST 06/25/21   Renato Shin, MD  melatonin 3 MG TABS tablet Take 3 mg by mouth at bedtime.    [provider]  Multiple Vitamins-Minerals (PRESERVISION AREDS 2 PO) Take by mouth in the morning and at bedtime.    [provider]  omeprazole (PRILOSEC) 20 MG capsule Take 1 capsule (20 mg total) by mouth daily. 10/29/21   Jinny Sanders, MD  OneTouch Delica Lancets 01X MISC by Does not apply route. Use to check blood sugar every other day    [provider]  ONETOUCH ULTRA test strip USE TO CHECK BLOOD SUGAR EVERY OTHER DAY E11.9 11/16/20   Jinny Sanders, MD  PRESCRIPTION MEDICATION every 6 (six) months. *Antibiotic Injection in Eye*    [provider]  ramipril (ALTACE) 2.5 MG capsule TAKE 1 CAPSULE BY MOUTH EVERY DAY 12/31/21   Burnell Blanks, MD  rosuvastatin (CRESTOR) 10 MG tablet Take 1 tablet (10 mg total) by mouth daily. 12/03/21   Burnell Blanks, MD  terazosin (HYTRIN) 10 MG capsule Take 1 capsule (10 mg total) by mouth daily. 01/07/22   Jinny Sanders, MD    Current  Facility-Administered Medications  Medication Dose Route Frequency Provider Last Rate Last Admin   pantoprazole (PROTONIX) 80 mg /NS 100 mL IVPB  80 mg Intravenous Once Curatolo, Adam, DO 300 mL/hr at 03/01/22 1430 80 mg at 03/01/22 1430   [START ON 03/05/2022] pantoprazole (PROTONIX) injection 40 mg  40 mg Intravenous Q12H Curatolo, Adam, DO       pantoprozole (PROTONIX) 80 mg /NS 100 mL infusion  8 mg/hr Intravenous Continuous Curatolo, Adam, DO       Current Outpatient Medications  Medication Sig Dispense Refill   aspirin EC 81 MG tablet Take 81 mg by mouth daily.  Blood Glucose Monitoring Suppl (ONE TOUCH ULTRA 2) w/Device KIT by Does not apply route. Use to check blood sugar every other day     cetirizine (ZYRTEC) 10 MG tablet Take 10 mg by mouth daily.     finasteride (PROSCAR) 5 MG tablet Take 1 tablet (5 mg total) by mouth daily. 90 tablet 3   furosemide (LASIX) 40 MG tablet TAKE 1 TABLET BY MOUTH  DAILY (DUE FOR AN APPT IN  APRIL AND CAN GET YEARLY  SUPPLY AT THAT VISIT.   PLEASE CALL TO SCHEDULE) 90 tablet 3   levothyroxine (SYNTHROID) 150 MCG tablet TAKE 1 TABLET BY MOUTH EVERY DAY BEFORE BREAKFAST 90 tablet 3   melatonin 3 MG TABS tablet Take 3 mg by mouth at bedtime.     Multiple Vitamins-Minerals (PRESERVISION AREDS 2 PO) Take by mouth in the morning and at bedtime.     omeprazole (PRILOSEC) 20 MG capsule Take 1 capsule (20 mg total) by mouth daily. 90 capsule 3   OneTouch Delica Lancets 17P MISC by Does not apply route. Use to check blood sugar every other day     ONETOUCH ULTRA test strip USE TO CHECK BLOOD SUGAR EVERY OTHER DAY E11.9 50 strip 3   PRESCRIPTION MEDICATION every 6 (six) months. *Antibiotic Injection in Eye*     ramipril (ALTACE) 2.5 MG capsule TAKE 1 CAPSULE BY MOUTH EVERY DAY 90 capsule 0   rosuvastatin (CRESTOR) 10 MG tablet Take 1 tablet (10 mg total) by mouth daily. 90 tablet 1   terazosin (HYTRIN) 10 MG capsule Take 1 capsule (10 mg total) by mouth daily.  90 capsule 3    Allergies as of 03/01/2022 - Review Complete 03/01/2022  Allergen Reaction Noted   Codeine Nausea Only 12/23/2006   Sulfonamide derivatives Hives 12/23/2006    Family History  Problem Relation Age of Onset   Heart failure Mother    Coronary artery disease Mother    Stroke Mother    Hypothyroidism Mother    Cancer Father        LUNG   Cancer Sister        BREAST   Heart failure Maternal Grandmother    Hypothyroidism Daughter    Colon cancer Neg Hx     Social History   Socioeconomic History   Marital status: Married    Spouse name: Not on file   Number of children: 1   Years of education: Not on file   Highest education level: Not on file  Occupational History   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  Tobacco Use   Smoking status: Former    Types: Cigarettes    Quit date: 07/03/1980    Years since quitting: 41.6   Smokeless tobacco: Never  Vaping Use   Vaping Use: Never used  Substance and Sexual Activity   Alcohol use: Yes    Alcohol/week: 0.0 standard drinks of alcohol    Comment: occassionally   Drug use: No   Sexual activity: Not on file  Other Topics Concern   Not on file  Social History Narrative   Married 1 daughter   Retired from 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   2 caffeinated drinks a day no alcohol tobacco or drug use    former smoker   Social Determinants of Radio broadcast assistant Strain: Low Risk  (04/25/2021)   Overall Financial Resource Strain (CARDIA)    Difficulty of Paying Living  Expenses: Not hard at all  Food Insecurity: No Food Insecurity (04/25/2021)   Hunger Vital Sign    Worried About Running Out of Food in the Last Year: Never true    Ran Out of Food in the Last Year: Never true  Transportation Needs: No Transportation Needs (04/25/2021)   PRAPARE - Hydrologist (Medical): No    Lack of Transportation (Non-Medical): No  Physical  Activity: Unknown (04/25/2021)   Exercise Vital Sign    Days of Exercise per Week: 5 days    Minutes of Exercise per Session: Not on file  Stress: No Stress Concern Present (04/25/2021)   [place]    Feeling of Stress : Not at all  Social Connections: Moderately Integrated (04/25/2021)   Social Connection and Isolation Panel [NHANES]    Frequency of Communication with Friends and Family: More than three times a week    Frequency of Social Gatherings with Friends and Family: More than three times a week    Attends Religious Services: More than 4 times per year    Active Member of [employer] or Organizations: No    Attends Archivist Meetings: Never    Marital Status: Married  Human resources officer Violence: Not At Risk (04/25/2021)   Humiliation, Afraid, Rape, and Kick questionnaire    Fear of Current or Ex-Partner: No    Emotionally Abused: No    Physically Abused: No    Sexually Abused: No    Review of Systems: ROS is O/W negative except as mentioned in HPI.  Physical Exam: Vital signs in last 24 hours: Temp:  [97.5 F (36.4 C)] 97.5 F (36.4 C) (12/09 1248) Pulse Rate:  [71-91] 72 (12/09 1427) Resp:  [16-24] 24 (12/09 1427) BP: (81-115)/(55-79) 85/55 (12/09 1427) SpO2:  [94 %-97 %] 95 % (12/09 1427)   General:  Alert, Well-developed, well-nourished, pleasant and cooperative in NAD Head:  Normocephalic and atraumatic. Eyes:  Sclera clear, no icterus.  Conjunctiva pink. Ears:  Normal auditory acuity. Mouth:  No deformity or lesions.   Lungs:  Clear throughout to auscultation.  No wheezes, crackles, or rhonchi.  Heart:  Regular rate and rhythm; no murmurs, clicks, rubs, or gallops. Abdomen:  Soft, non-distended.  BS present.  Non-tender.   Rectal:  Deferred.  Heme positive per EDP.  Msk:  Symmetrical without gross deformities. Pulses:  Normal pulses noted. Extremities:  Without clubbing or edema. Neurologic:   Alert and oriented x 4;  grossly normal neurologically. Skin:  Intact without significant lesions or rashes. Psych:  Alert and cooperative. Normal mood and affect.  Lab Results: Recent Labs    03/01/22 1229  WBC 8.3  HGB 9.8*  HCT 30.6*  PLT 189   BMET Recent Labs    03/01/22 1229  NA 137  K 4.2  CL 106  CO2 21*  GLUCOSE 156*  BUN 30*  CREATININE 1.27*  CALCIUM 9.1   Studies/Results: DG Chest Portable 1 View  Result Date: 03/01/2022 CLINICAL DATA:  Chest pain EXAM: PORTABLE CHEST 1 VIEW COMPARISON:  07/30/16 CXR FINDINGS: The heart size and mediastinal contours are within normal limits. Both lungs are clear. The visualized skeletal structures are unremarkable. Aortic atherosclerotic calcifications. IMPRESSION: No active disease. Electronically Signed   By: Marin Roberts M.D.   On: 03/01/2022 14:06    IMPRESSION:  *Anemia with Hgb of 9.8 grams, reports of black stools and heme positive.  On ASA 81 mg  daily.  No BM now in the past 2 to 3 days.  Rule out ulcer, esophagitis, etc.  Is on omeprazole 20 mg daily at home.  PLAN: -Agree with pantoprazole 40 mg IV. -Monitor Hgb and transfuse prn. -EGD tomorrow, 12/10.  Laban Emperor. Tavonte Seybold  03/01/2022, 2:34 PM

## 2022-03-01 NOTE — ED Notes (Signed)
Dr Ronnald Nian notified of pt's hypotensive bp of 88/71. No new orders obtained. Will keep monitoring

## 2022-03-02 ENCOUNTER — Observation Stay (HOSPITAL_BASED_OUTPATIENT_CLINIC_OR_DEPARTMENT_OTHER): Payer: PPO | Admitting: Certified Registered Nurse Anesthetist

## 2022-03-02 ENCOUNTER — Observation Stay (HOSPITAL_COMMUNITY): Payer: PPO | Admitting: Certified Registered Nurse Anesthetist

## 2022-03-02 ENCOUNTER — Encounter (HOSPITAL_COMMUNITY)
Admission: EM | Disposition: A | Discharge status: Home / Self Care | Payer: Self-pay | Source: Home / Self Care | Attending: Internal Medicine

## 2022-03-02 ENCOUNTER — Encounter (HOSPITAL_COMMUNITY): Payer: Self-pay | Admitting: Internal Medicine

## 2022-03-02 DIAGNOSIS — Z96652 Presence of left artificial knee joint: Secondary | ICD-10-CM | POA: Diagnosis present

## 2022-03-02 DIAGNOSIS — K648 Other hemorrhoids: Secondary | ICD-10-CM | POA: Diagnosis not present

## 2022-03-02 DIAGNOSIS — K222 Esophageal obstruction: Secondary | ICD-10-CM

## 2022-03-02 DIAGNOSIS — Z85850 Personal history of malignant neoplasm of thyroid: Secondary | ICD-10-CM | POA: Diagnosis not present

## 2022-03-02 DIAGNOSIS — K635 Polyp of colon: Secondary | ICD-10-CM | POA: Diagnosis not present

## 2022-03-02 DIAGNOSIS — K625 Hemorrhage of anus and rectum: Secondary | ICD-10-CM | POA: Diagnosis present

## 2022-03-02 DIAGNOSIS — D62 Acute posthemorrhagic anemia: Secondary | ICD-10-CM | POA: Diagnosis present

## 2022-03-02 DIAGNOSIS — K449 Diaphragmatic hernia without obstruction or gangrene: Secondary | ICD-10-CM | POA: Diagnosis not present

## 2022-03-02 DIAGNOSIS — K921 Melena: Secondary | ICD-10-CM | POA: Diagnosis not present

## 2022-03-02 DIAGNOSIS — K5730 Diverticulosis of large intestine without perforation or abscess without bleeding: Secondary | ICD-10-CM | POA: Diagnosis not present

## 2022-03-02 DIAGNOSIS — D122 Benign neoplasm of ascending colon: Secondary | ICD-10-CM | POA: Diagnosis not present

## 2022-03-02 DIAGNOSIS — N400 Enlarged prostate without lower urinary tract symptoms: Secondary | ICD-10-CM | POA: Diagnosis present

## 2022-03-02 DIAGNOSIS — I959 Hypotension, unspecified: Secondary | ICD-10-CM | POA: Diagnosis not present

## 2022-03-02 DIAGNOSIS — I5042 Chronic combined systolic (congestive) and diastolic (congestive) heart failure: Secondary | ICD-10-CM | POA: Diagnosis present

## 2022-03-02 DIAGNOSIS — I5022 Chronic systolic (congestive) heart failure: Secondary | ICD-10-CM | POA: Diagnosis not present

## 2022-03-02 DIAGNOSIS — K3189 Other diseases of stomach and duodenum: Secondary | ICD-10-CM

## 2022-03-02 DIAGNOSIS — D120 Benign neoplasm of cecum: Secondary | ICD-10-CM | POA: Diagnosis not present

## 2022-03-02 DIAGNOSIS — Z885 Allergy status to narcotic agent status: Secondary | ICD-10-CM | POA: Diagnosis not present

## 2022-03-02 DIAGNOSIS — I2510 Atherosclerotic heart disease of native coronary artery without angina pectoris: Secondary | ICD-10-CM

## 2022-03-02 DIAGNOSIS — E890 Postprocedural hypothyroidism: Secondary | ICD-10-CM | POA: Diagnosis present

## 2022-03-02 DIAGNOSIS — I509 Heart failure, unspecified: Secondary | ICD-10-CM | POA: Diagnosis not present

## 2022-03-02 DIAGNOSIS — D649 Anemia, unspecified: Secondary | ICD-10-CM | POA: Diagnosis not present

## 2022-03-02 DIAGNOSIS — I110 Hypertensive heart disease with heart failure: Secondary | ICD-10-CM | POA: Diagnosis present

## 2022-03-02 DIAGNOSIS — Z882 Allergy status to sulfonamides status: Secondary | ICD-10-CM | POA: Diagnosis not present

## 2022-03-02 DIAGNOSIS — E119 Type 2 diabetes mellitus without complications: Secondary | ICD-10-CM | POA: Diagnosis not present

## 2022-03-02 DIAGNOSIS — Z9841 Cataract extraction status, right eye: Secondary | ICD-10-CM | POA: Diagnosis not present

## 2022-03-02 DIAGNOSIS — D500 Iron deficiency anemia secondary to blood loss (chronic): Secondary | ICD-10-CM | POA: Diagnosis not present

## 2022-03-02 DIAGNOSIS — K254 Chronic or unspecified gastric ulcer with hemorrhage: Secondary | ICD-10-CM | POA: Diagnosis present

## 2022-03-02 DIAGNOSIS — Z823 Family history of stroke: Secondary | ICD-10-CM | POA: Diagnosis not present

## 2022-03-02 DIAGNOSIS — R195 Other fecal abnormalities: Secondary | ICD-10-CM | POA: Diagnosis not present

## 2022-03-02 DIAGNOSIS — E11319 Type 2 diabetes mellitus with unspecified diabetic retinopathy without macular edema: Secondary | ICD-10-CM | POA: Diagnosis present

## 2022-03-02 DIAGNOSIS — E785 Hyperlipidemia, unspecified: Secondary | ICD-10-CM | POA: Diagnosis present

## 2022-03-02 DIAGNOSIS — E876 Hypokalemia: Secondary | ICD-10-CM | POA: Diagnosis present

## 2022-03-02 DIAGNOSIS — Z8249 Family history of ischemic heart disease and other diseases of the circulatory system: Secondary | ICD-10-CM | POA: Diagnosis not present

## 2022-03-02 DIAGNOSIS — Z87442 Personal history of urinary calculi: Secondary | ICD-10-CM | POA: Diagnosis not present

## 2022-03-02 DIAGNOSIS — Z9842 Cataract extraction status, left eye: Secondary | ICD-10-CM | POA: Diagnosis not present

## 2022-03-02 DIAGNOSIS — K259 Gastric ulcer, unspecified as acute or chronic, without hemorrhage or perforation: Secondary | ICD-10-CM | POA: Diagnosis not present

## 2022-03-02 DIAGNOSIS — K922 Gastrointestinal hemorrhage, unspecified: Secondary | ICD-10-CM | POA: Diagnosis present

## 2022-03-02 DIAGNOSIS — Z79899 Other long term (current) drug therapy: Secondary | ICD-10-CM | POA: Diagnosis not present

## 2022-03-02 DIAGNOSIS — E669 Obesity, unspecified: Secondary | ICD-10-CM | POA: Diagnosis present

## 2022-03-02 DIAGNOSIS — Z87891 Personal history of nicotine dependence: Secondary | ICD-10-CM

## 2022-03-02 DIAGNOSIS — K5731 Diverticulosis of large intestine without perforation or abscess with bleeding: Secondary | ICD-10-CM | POA: Diagnosis present

## 2022-03-02 DIAGNOSIS — Z85828 Personal history of other malignant neoplasm of skin: Secondary | ICD-10-CM | POA: Diagnosis not present

## 2022-03-02 DIAGNOSIS — D509 Iron deficiency anemia, unspecified: Secondary | ICD-10-CM | POA: Diagnosis not present

## 2022-03-02 HISTORY — PX: ESOPHAGOGASTRODUODENOSCOPY (EGD) WITH PROPOFOL: SHX5813

## 2022-03-02 LAB — PREPARE FRESH FROZEN PLASMA

## 2022-03-02 LAB — BASIC METABOLIC PANEL
Anion gap: 10 (ref 5–15)
BUN: 28 mg/dL — ABNORMAL HIGH (ref 8–23)
CO2: 20 mmol/L — ABNORMAL LOW (ref 22–32)
Calcium: 8.7 mg/dL — ABNORMAL LOW (ref 8.9–10.3)
Chloride: 108 mmol/L (ref 98–111)
Creatinine, Ser: 1.13 mg/dL (ref 0.61–1.24)
GFR, Estimated: >60 mL/min (ref >60)
Glucose, Bld: 99 mg/dL (ref 70–99)
Potassium: 4.2 mmol/L (ref 3.5–5.1)
Sodium: 138 mmol/L (ref 135–145)

## 2022-03-02 LAB — GLUCOSE, CAPILLARY
Glucose-Capillary: 124 mg/dL — ABNORMAL HIGH (ref 70–99)
Glucose-Capillary: 118 mg/dL — ABNORMAL HIGH (ref 70–99)
Glucose-Capillary: 126 mg/dL — ABNORMAL HIGH (ref 70–99)

## 2022-03-02 LAB — CBC
HCT: 27.5 % — ABNORMAL LOW (ref 39.0–52.0)
Hemoglobin: 8.9 g/dL — ABNORMAL LOW (ref 13.0–17.0)
MCH: 30.1 pg (ref 26.0–34.0)
MCHC: 32.4 g/dL (ref 30.0–36.0)
MCV: 92.9 fL (ref 80.0–100.0)
Platelets: 165 10*3/uL (ref 150–400)
RBC: 2.96 MIL/uL — ABNORMAL LOW (ref 4.22–5.81)
RDW: 13.4 % (ref 11.5–15.5)
WBC: 8.5 10*3/uL (ref 4.0–10.5)
nRBC: 0 % (ref 0.0–0.2)

## 2022-03-02 LAB — BPAM FFP
Blood Product Expiration Date: 202312102359
ISSUE DATE / TIME: 202312091740
Unit Type and Rh: 5100

## 2022-03-02 LAB — HEMOGLOBIN AND HEMATOCRIT, BLOOD
HCT: 27.8 % — ABNORMAL LOW (ref 39.0–52.0)
Hemoglobin: 9.4 g/dL — ABNORMAL LOW (ref 13.0–17.0)

## 2022-03-02 SURGERY — ESOPHAGOGASTRODUODENOSCOPY (EGD) WITH PROPOFOL
Anesthesia: Monitor Anesthesia Care

## 2022-03-02 MED ORDER — PANTOPRAZOLE SODIUM 40 MG PO TBEC
40.0000 mg | DELAYED_RELEASE_TABLET | Freq: Every day | ORAL | Status: DC
  Administered 2022-03-02 – 2022-03-06 (×5): 40 mg via ORAL
  Filled 2022-03-02 (×5): qty 1

## 2022-03-02 MED ORDER — SODIUM CHLORIDE 0.9 % IV SOLN: INTRAVENOUS | Status: DC

## 2022-03-02 MED ORDER — PEG-KCL-NACL-NASULF-NA ASC-C 100 G PO SOLR
0.5000 | Freq: Once | ORAL | Status: AC
  Administered 2022-03-02: 100 g via ORAL
  Filled 2022-03-02: qty 1

## 2022-03-02 MED ORDER — PROPOFOL 10 MG/ML IV BOLUS
INTRAVENOUS | Status: DC | PRN
  Administered 2022-03-02 (×2): 20 mg via INTRAVENOUS

## 2022-03-02 MED ORDER — PEG-KCL-NACL-NASULF-NA ASC-C 100 G PO SOLR
0.5000 | Freq: Once | ORAL | Status: AC
  Administered 2022-03-03: 100 g via ORAL
  Filled 2022-03-02: qty 1

## 2022-03-02 MED ORDER — INSULIN ASPART 100 UNIT/ML IJ SOLN
0.0000 [IU] | Freq: Three times a day (TID) | INTRAMUSCULAR | Status: DC
  Administered 2022-03-03 – 2022-03-06 (×3): 2 [IU] via SUBCUTANEOUS

## 2022-03-02 MED ORDER — PROPOFOL 500 MG/50ML IV EMUL
INTRAVENOUS | Status: DC | PRN
  Administered 2022-03-02: 75 ug/kg/min via INTRAVENOUS

## 2022-03-02 MED ORDER — PEG-KCL-NACL-NASULF-NA ASC-C 100 G PO SOLR: 1.0000 | Freq: Once | ORAL | Status: DC

## 2022-03-02 MED ORDER — LACTATED RINGERS IV SOLN: INTRAVENOUS | Status: DC | PRN

## 2022-03-02 SURGICAL SUPPLY — 15 items

## 2022-03-02 NOTE — Anesthesia Preprocedure Evaluation (Addendum)
Anesthesia Evaluation  Patient identified by MRN, date of birth, ID band Patient awake    Reviewed: Allergy & Precautions, NPO status , Patient's Chart, lab work & pertinent test results  Airway Mallampati: I  TM Distance: >3 FB Neck ROM: Full    Dental  (+) Missing, Dental Advisory Given, Partial Upper, Partial Lower   Pulmonary former smoker   breath sounds clear to auscultation       Cardiovascular hypertension, + CAD and +CHF   Rhythm:Regular Rate:Normal  Echo (2021):  1. Left ventricular ejection fraction, by estimation, is 30 to 35%. The  left ventricle has moderately decreased function. The left ventricle has  no regional wall motion abnormalities. There is mild concentric left  ventricular hypertrophy. Left  ventricular diastolic parameters are consistent with Grade I diastolic  dysfunction (impaired relaxation). Elevated left ventricular end-diastolic  pressure.   2. Right ventricular systolic function is normal. The right ventricular  size is normal. There is normal pulmonary artery systolic pressure.   3. Left atrial size was moderately dilated.   4. The mitral valve is normal in structure. Trivial mitral valve  regurgitation. No evidence of mitral stenosis.   5. The aortic valve is tricuspid. Aortic valve regurgitation is mild. No  aortic stenosis is present.   6. Aortic dilatation noted. 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   7. The inferior vena cava is normal in size with greater than 50%  respiratory variability, suggesting right atrial pressure of 3 mmHg.     Neuro/Psych  Headaches  Neuromuscular disease  negative psych ROS   GI/Hepatic Neg liver ROS,GERD  ,,  Endo/Other  diabetesHypothyroidism    Renal/GU negative Renal ROS     Musculoskeletal  (+) Arthritis ,    Abdominal   Peds  Hematology   Anesthesia Other Findings   Reproductive/Obstetrics                              Anesthesia Physical Anesthesia Plan  ASA: 3  Anesthesia Plan: MAC   Post-op Pain Management:    Induction: Intravenous  PONV Risk Score and Plan: 0 and Propofol infusion  Airway Management Planned: Natural Airway and Simple Face Mask  Additional Equipment: None  Intra-op Plan:   Post-operative Plan:   Informed Consent: I have reviewed the patients History and Physical, chart, labs and discussed the procedure including the risks, benefits and alternatives for the proposed anesthesia with the patient or authorized representative who has indicated his/her understanding and acceptance.       Plan Discussed with: CRNA  Anesthesia Plan Comments:         Anesthesia Quick Evaluation

## 2022-03-02 NOTE — Care Management Obs Status (Signed)
[place] NOTIFICATION   Patient Details  Name: Dominic Kane MRN: [id number] Date of Birth: Mar 16, 1941   Medicare Observation Status Notification Given:  Yes    Bethena Roys, RN 03/02/2022, 2:02 PM

## 2022-03-02 NOTE — Progress Notes (Signed)
Patient arrived back to unit post procedure alert and oriented x4. Pt now requiring 4L [place] and coughing. PRN ned administered.

## 2022-03-02 NOTE — Transfer of Care (Signed)
Immediate Anesthesia Transfer of Care Note  Patient: Dominic Kane  Procedure(s) Performed: ESOPHAGOGASTRODUODENOSCOPY (EGD) WITH PROPOFOL  Patient Location: PACU  Anesthesia Type:MAC  Level of Consciousness: awake, alert , and oriented  Airway & Oxygen Therapy: Patient Spontanous Breathing and Patient connected to nasal cannula oxygen  Post-op Assessment: Report given to RN and Post -op Vital signs reviewed and stable  Post vital signs: Reviewed and stable  Last Vitals:  Vitals Value Taken Time  BP 118/69 03/02/22 0922  Temp    Pulse 90 03/02/22 0921  Resp 26 03/02/22 0921  SpO2 100 % 03/02/22 0921  Vitals shown include unvalidated device data.  Last Pain:  Vitals:   03/02/22 0830  TempSrc: Temporal  PainSc: 0-No pain         Complications: No notable events documented.

## 2022-03-02 NOTE — Interval H&P Note (Signed)
History and Physical Interval Note:  03/02/2022 8:56 AM  Dominic Kane  has presented today for surgery, with the diagnosis of Melena, anemia.  The various methods of treatment have been discussed with the patient and family. After consideration of risks, benefits and other options for treatment, the patient has consented to  Procedure(s): ESOPHAGOGASTRODUODENOSCOPY (EGD) WITH PROPOFOL (N/A) as a surgical intervention.  The patient's history has been reviewed, patient examined, no change in status, stable for surgery.  I have reviewed the patient's chart and labs.  Questions were answered to the patient's satisfaction.     Dominic Kane

## 2022-03-02 NOTE — Anesthesia Procedure Notes (Signed)
Procedure Name: MAC Date/Time: 03/02/2022 8:56 AM  Performed by: Carolan Clines, CRNAPre-anesthesia Checklist: Patient identified, Emergency Drugs available and Suction available Patient Re-evaluated:Patient Re-evaluated prior to induction Oxygen Delivery Method: Nasal cannula Dental Injury: Teeth and Oropharynx as per pre-operative assessment

## 2022-03-02 NOTE — Anesthesia Postprocedure Evaluation (Signed)
Anesthesia Post Note  Patient: Dominic Kane  Procedure(s) Performed: ESOPHAGOGASTRODUODENOSCOPY (EGD) WITH PROPOFOL     Patient location during evaluation: PACU Anesthesia Type: MAC Level of consciousness: awake and alert Pain management: pain level controlled Vital Signs Assessment: post-procedure vital signs reviewed and stable Respiratory status: spontaneous breathing, nonlabored ventilation, respiratory function stable and patient connected to nasal cannula oxygen Cardiovascular status: stable and blood pressure returned to baseline Postop Assessment: no apparent nausea or vomiting Anesthetic complications: no   No notable events documented.  Last Vitals:  Vitals:   03/02/22 1002 03/02/22 1100  BP:  109/65  Pulse:  72  Resp:  19  Temp:  36.7 C  SpO2: 97% 93%    Last Pain:  Vitals:   03/02/22 1100  TempSrc: Oral  PainSc:                  [name]

## 2022-03-02 NOTE — Progress Notes (Signed)
PROGRESS NOTE    Dominic Kane  JJK:[id number] DOB: [birth year] DOA: 03/01/2022 PCP: Jinny Sanders, MD    Brief Narrative:  Dominic Kane is a 81 y.o. 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  He actually came in as his blood sugars have been sitting and was elevated up to 220.  Normally his blood sugars are around 106.  Patient reports that he had red blood per rectum initially, but over the last week stools turned to black.    Assessment and Plan: Acute blood loss anemia secondary to GI bleed Patient presents with complaints of fatigue and shortness of breath with reports of dark stools the last couple of days.  Stool guaiacs positive for blood.  Hemoglobin 9.8 with elevated BUN.  Patient had been started protonix drip and ordered to be transfused 1 unit of PRBCs due to initial hypotension. -s/p egd: no sign of recent bleeding: Pantoprazole '40mg'$  daily, Monitor Hgb, if remains stable we wil hold off additional inpatient GI work up.  Follow up iron panel and IV iron infusion if  deficient.   Possibel chronic GI blood loss secondary to gastric erosions, but if continues to have decline in Hgb will need to consider small bowel video  capsule and also possible colonoscopy   Transient hypotension Acute.  Blood pressures documented as low as 82/47.  Home blood pressure regimen includes furosemide 40 mg daily, terazosin 10 mg daily, ramipril 2.5 mg daily. -Continue goal MAP greater than 65 -Hold home blood pressure regimen   Chest discomfort EKG without significant ischemic changes.  Initial high-sensitivity troponin 7.    -resolved   Chronic Systolic CHF Chronic.  Patient with trace to 1+ pitting edema bilaterally lower extremities.  Last EF noted  to be 30 to 35% with grade 1 diastolic dysfunction back in 2021.  CXR was clear.  Patient does not appear grossly fluid overloaded at this time. -Strict I&O's and daily weights -Check BNP -Consider Lasix PRN   Controlled diabetes mellitus type 2, without long-term use of insulin At home blood sugars were reported to be elevated up to 220.  Last available hemoglobin A1c 6.8 on 10/22/2021.  Patient had been relatively diet-controlled. -SSI while in hospital   Hypothyroidism History of papillary thyroid cancer Status post total thyroidectomy for papillary thyroid cancer back in 2018 by Dr. Harlow Asa. Last TSH 4.48 in 07/22/2021. -Continue levothyroxine   Hyperlipidemia -Continue Crestor   BPH -Continue Proscar  Obesity Estimated body mass index is 30.13 kg/m as calculated from the following:   Height as of this encounter: '5\' 10"'$  (1.778 m).   Weight as of this encounter: 95.3 kg.   DVT prophylaxis: SCDs Start: 03/01/22 1505    Code Status: Full Code Family Communication: wife at bedside  Disposition Plan:  Level of care: Progressive Status is: Observation The patient will require care spanning > 2 midnights and should be moved to inpatient because: GI bleeding    Consultants:  GI   Subjective: Asking about going home  Objective: Vitals:   03/01/22 1900 03/01/22 2350 03/02/22 0700 03/02/22 0830  BP: (!) 134/90 131/60 121/71 127/67  Pulse: 71 66 78 66  Resp: (!) '22 18 20 20  '$ Temp: 98.3 F (36.8 C) 97.9 F (36.6 C) 97.9 F (36.6 C) 98.2  F (36.8 C)  TempSrc: Oral Oral Oral Temporal  SpO2:   95% 100%  Weight:    95.3 kg  Height:    '5\' 10"'$  (1.778 m)    Intake/Output Summary (Last 24 hours) at 03/02/2022 0851 Last data filed at 03/01/2022 1925 Gross per 24 hour  Intake 385.2 ml  Output 650 ml  Net -264.8 ml   Filed Weights   03/02/22 0830  Weight: 95.3 kg    Examination:   General: Appearance:    Obese male in no acute distress     Lungs:     respirations  unlabored  Heart:    Normal heart rate. Normal rhythm. No murmurs, rubs, or gallops.    MS:   All extremities are intact.    Neurologic:   Awake, alert, oriented x 3. No apparent focal neurological           defect.        Data Reviewed: I have personally reviewed following labs and imaging studies  CBC: Recent Labs  Lab 03/01/22 1229 03/01/22 1600 03/01/22 2219 03/02/22 0021  WBC 8.3  --   --  8.5  HGB 9.8* 9.9* 9.0* 8.9*  HCT 30.6* 28.9* 26.6* 27.5*  MCV 94.2  --   --  92.9  PLT 189  --   --  176   Basic Metabolic Panel: Recent Labs  Lab 03/01/22 1229 03/02/22 0021  NA 137 138  K 4.2 4.2  CL 106 108  CO2 21* 20*  GLUCOSE 156* 99  BUN 30* 28*  CREATININE 1.27* 1.13  CALCIUM 9.1 8.7*   GFR: Estimated Creatinine Clearance: 59.4 mL/min (by C-G formula based on SCr of 1.13 mg/dL). Liver Function Tests: Recent Labs  Lab 03/01/22 1429  AST 21  ALT 15  ALKPHOS 68  BILITOT 0.8  PROT 6.2*  ALBUMIN 3.7   No results for input(s): "LIPASE", "AMYLASE" in the last 168 hours. No results for input(s): "AMMONIA" in the last 168 hours. Coagulation Profile: No results for input(s): "INR", "PROTIME" in the last 168 hours. Cardiac Enzymes: No results for input(s): "CKTOTAL", "CKMB", "CKMBINDEX", "TROPONINI" in the last 168 hours. BNP (last 3 results) No results for input(s): "PROBNP" in the last 8760 hours. HbA1C: No results for input(s): "HGBA1C" in the last 72 hours. CBG: No results for input(s): "GLUCAP" in the last 168 hours. Lipid Profile: No results for input(s): "CHOL", "HDL", "LDLCALC", "TRIG", "CHOLHDL", "LDLDIRECT" in the last 72 hours. Thyroid Function Tests: Recent Labs    03/01/22 1610  TSH 0.707   Anemia Panel: No results for input(s): "VITAMINB12", "FOLATE", "FERRITIN", "TIBC", "IRON", "RETICCTPCT" in the last 72 hours. Sepsis Labs: No results for input(s): "PROCALCITON", "LATICACIDVEN" in the last 168 hours.  Recent Results (from the past 240  hour(s))  MRSA Next Gen by PCR, Nasal     Status: None   Collection Time: 03/01/22  5:10 PM   Specimen: Nasal Mucosa; Nasal Swab  Result Value Ref Range Status   MRSA by PCR Next Gen NOT DETECTED NOT DETECTED Final    Comment: (NOTE) The GeneXpert MRSA Assay (FDA approved for NASAL specimens only), is one component of a comprehensive MRSA colonization surveillance program. It is not intended to diagnose MRSA infection nor to guide or monitor treatment for MRSA infections. Test performance is not FDA approved in patients less than 67 years old. Performed at [facility], [place] [address], [place], [place] [address]  Radiology Studies: DG Chest Portable 1 View  Result Date: 03/01/2022 CLINICAL DATA:  Chest pain EXAM: PORTABLE CHEST 1 VIEW COMPARISON:  07/30/16 CXR FINDINGS: The heart size and mediastinal contours are within normal limits. Both lungs are clear. The visualized skeletal structures are unremarkable. Aortic atherosclerotic calcifications. IMPRESSION: No active disease. Electronically Signed   By: Marin Roberts M.D.   On: 03/01/2022 14:06        Scheduled Meds:  [MAR Hold] finasteride  5 mg Oral Daily   [MAR Hold] levothyroxine  150 mcg Oral Q0600   [MAR Hold] melatonin  5 mg Oral QHS   [MAR Hold] pantoprazole  40 mg Intravenous Q12H   [MAR Hold] rosuvastatin  10 mg Oral Daily   [MAR Hold] sodium chloride flush  3 mL Intravenous Q12H   Continuous Infusions:  sodium chloride 20 mL/hr at 03/01/22 2223   pantoprazole 8 mg/hr (03/02/22 0840)     LOS: 0 days    Time spent: 45 minutes spent on chart review, discussion with nursing staff, consultants, updating family and interview/physical exam; more than 50% of that time was spent in counseling and/or coordination of care.    Geradine Girt, DO [facility] Available via Epic secure chat 7am-7pm After these hours, please refer to coverage provider listed on [email or web address] 03/02/2022, 8:51 AM

## 2022-03-02 NOTE — Op Note (Signed)
[facility] Patient Name: Dominic Kane Procedure Date : 03/02/2022 MRN: [id number] Attending MD: Mauri Pole , MD, [id number] Date of Birth: [birth year] CSN: [id number] Age: 81 Admit Type: Inpatient Procedure:                Upper GI endoscopy Indications:              Suspected upper gastrointestinal bleeding,                            Gastrointestinal bleeding of unknown origin Providers:                Mauri Pole, MD, Vista Lawman, RN, Brien Mates, Technician Referring MD:              Medicines:                Monitored Anesthesia Care Complications:            No immediate complications. Estimated Blood Loss:     Estimated blood loss was minimal. Procedure:                Pre-Anesthesia Assessment:                           - Prior to the procedure, a History and Physical                            was performed, and patient medications and                            allergies were reviewed. The patient's tolerance of                            previous anesthesia was also reviewed. The risks                            and benefits of the procedure and the sedation                            options and risks were discussed with the patient.                            All questions were answered, and informed consent                            was obtained. Prior Anticoagulants: The patient has                            taken no anticoagulant or antiplatelet agents. ASA                            Grade Assessment: III - A patient with severe  systemic disease. After reviewing the risks and                            benefits, the patient was deemed in satisfactory                            condition to undergo the procedure.                           After obtaining informed consent, the endoscope was                            passed under direct vision. Throughout the                             procedure, the patient's blood pressure, pulse, and                            oxygen saturations were monitored continuously. The                            [id number]-H190 ([id number]) Olympus endoscope was introduced                            through the mouth, and advanced to the second part                            of duodenum. The upper GI endoscopy was                            accomplished without difficulty. The patient                            tolerated the procedure well. Scope In: Scope Out: Findings:      One benign-appearing, intrinsic mild stenosis was found 37 to 38 cm from       the incisors. This stenosis measured greater than or equal to 3 cm       (inner diameter) x less than one cm (in length). The stenosis was       traversed.      A 4 cm hiatal hernia was present.      A few dispersed less than 5 mm erosions with no bleeding and no stigmata       of recent bleeding were found in the gastric antrum, in the prepyloric       region of the stomach and at the pylorus.      The cardia and gastric fundus were normal on retroflexion.      The examined duodenum was normal. Impression:               - Benign-appearing esophageal stenosis.                           - 4 cm hiatal hernia.                           -  Erosive gastropathy with no bleeding and no                            stigmata of recent bleeding.                           - Normal examined duodenum.                           - No specimens collected. Recommendation:           - Patient has a contact number available for                            emergencies. The signs and symptoms of potential                            delayed complications were discussed with the                            patient. Return to normal activities tomorrow.                            Written discharge instructions were provided to the                            patient.                           - Resume previous  diet.                           - Continue present medications.                           - Pantoprazole '40mg'$  daily                           - Monitor Hgb, if remains stable we wil hold off                            additional inpatient GI work up                           - Follow up iron panel and IV iron infusion if                            deficient                           - Possibel chronic GI blood loss secondary to                            gastric erosions, but if continues to have decline                            in Hgb  will need to consider small bowel video                            capsule and also possible colonoscopy Procedure Code(s):        --- Professional ---                           [phone], Esophagogastroduodenoscopy, flexible,                            transoral; diagnostic, including collection of                            specimen(s) by brushing or washing, when performed                            (separate procedure) Diagnosis Code(s):        --- Professional ---                           K22.2, Esophageal obstruction                           K44.9, Diaphragmatic hernia without obstruction or                            gangrene                           K31.89, Other diseases of stomach and duodenum                           K92.2, Gastrointestinal hemorrhage, unspecified CPT copyright 2022 [employer]. All rights reserved. The codes documented in this report are preliminary and upon coder review may  be revised to meet current compliance requirements. Mauri Pole, MD 03/02/2022 9:50:50 AM This report has been signed electronically. Number of Addenda: 0

## 2022-03-03 ENCOUNTER — Encounter (HOSPITAL_COMMUNITY): Payer: Self-pay | Admitting: Internal Medicine

## 2022-03-03 ENCOUNTER — Inpatient Hospital Stay (HOSPITAL_COMMUNITY): Payer: PPO | Admitting: Certified Registered Nurse Anesthetist

## 2022-03-03 ENCOUNTER — Encounter (HOSPITAL_COMMUNITY)
Admission: EM | Disposition: A | Discharge status: Home / Self Care | Payer: Self-pay | Source: Home / Self Care | Attending: Internal Medicine

## 2022-03-03 DIAGNOSIS — D509 Iron deficiency anemia, unspecified: Secondary | ICD-10-CM

## 2022-03-03 DIAGNOSIS — D62 Acute posthemorrhagic anemia: Secondary | ICD-10-CM | POA: Diagnosis not present

## 2022-03-03 DIAGNOSIS — E119 Type 2 diabetes mellitus without complications: Secondary | ICD-10-CM | POA: Diagnosis not present

## 2022-03-03 DIAGNOSIS — K259 Gastric ulcer, unspecified as acute or chronic, without hemorrhage or perforation: Secondary | ICD-10-CM | POA: Diagnosis not present

## 2022-03-03 DIAGNOSIS — K921 Melena: Secondary | ICD-10-CM

## 2022-03-03 DIAGNOSIS — K625 Hemorrhage of anus and rectum: Secondary | ICD-10-CM

## 2022-03-03 DIAGNOSIS — K922 Gastrointestinal hemorrhage, unspecified: Secondary | ICD-10-CM | POA: Diagnosis not present

## 2022-03-03 HISTORY — PX: COLONOSCOPY WITH PROPOFOL: SHX5780

## 2022-03-03 LAB — BASIC METABOLIC PANEL
Anion gap: 10 (ref 5–15)
BUN: 26 mg/dL — ABNORMAL HIGH (ref 8–23)
CO2: 20 mmol/L — ABNORMAL LOW (ref 22–32)
Calcium: 8.6 mg/dL — ABNORMAL LOW (ref 8.9–10.3)
Chloride: 108 mmol/L (ref 98–111)
Creatinine, Ser: 0.91 mg/dL (ref 0.61–1.24)
GFR, Estimated: >60 mL/min (ref >60)
Glucose, Bld: 108 mg/dL — ABNORMAL HIGH (ref 70–99)
Potassium: 3.8 mmol/L (ref 3.5–5.1)
Sodium: 138 mmol/L (ref 135–145)

## 2022-03-03 LAB — CBC
HCT: 25.9 % — ABNORMAL LOW (ref 39.0–52.0)
Hemoglobin: 8.9 g/dL — ABNORMAL LOW (ref 13.0–17.0)
MCH: 31.6 pg (ref 26.0–34.0)
MCHC: 34.4 g/dL (ref 30.0–36.0)
MCV: 91.8 fL (ref 80.0–100.0)
Platelets: 174 10*3/uL (ref 150–400)
RBC: 2.82 MIL/uL — ABNORMAL LOW (ref 4.22–5.81)
RDW: 13.4 % (ref 11.5–15.5)
WBC: 9.7 10*3/uL (ref 4.0–10.5)
nRBC: 0 % (ref 0.0–0.2)

## 2022-03-03 LAB — GLUCOSE, CAPILLARY
Glucose-Capillary: 117 mg/dL — ABNORMAL HIGH (ref 70–99)
Glucose-Capillary: 118 mg/dL — ABNORMAL HIGH (ref 70–99)
Glucose-Capillary: 134 mg/dL — ABNORMAL HIGH (ref 70–99)
Glucose-Capillary: 146 mg/dL — ABNORMAL HIGH (ref 70–99)
Glucose-Capillary: 78 mg/dL (ref 70–99)

## 2022-03-03 LAB — HEMOGLOBIN AND HEMATOCRIT, BLOOD
HCT: 23.9 % — ABNORMAL LOW (ref 39.0–52.0)
Hemoglobin: 8.2 g/dL — ABNORMAL LOW (ref 13.0–17.0)

## 2022-03-03 LAB — IRON AND TIBC
Iron: 40 ug/dL — ABNORMAL LOW (ref 45–182)
Saturation Ratios: 13 % — ABNORMAL LOW (ref 17.9–39.5)
TIBC: 315 ug/dL (ref 250–450)
UIBC: 275 ug/dL

## 2022-03-03 LAB — FERRITIN: Ferritin: 23 ng/mL — ABNORMAL LOW (ref 24–336)

## 2022-03-03 SURGERY — COLONOSCOPY WITH PROPOFOL
Anesthesia: Monitor Anesthesia Care

## 2022-03-03 MED ORDER — PROPOFOL 500 MG/50ML IV EMUL
INTRAVENOUS | Status: DC | PRN
  Administered 2022-03-03: 75 ug/kg/min via INTRAVENOUS

## 2022-03-03 MED ORDER — LACTATED RINGERS IV SOLN
INTRAVENOUS | Status: AC | PRN
  Administered 2022-03-03: 1000 mL via INTRAVENOUS

## 2022-03-03 MED ORDER — PEG-KCL-NACL-NASULF-NA ASC-C 100 G PO SOLR
1.0000 | Freq: Once | ORAL | Status: AC
  Administered 2022-03-03: 200 g via ORAL
  Filled 2022-03-03 (×2): qty 1

## 2022-03-03 MED ORDER — PHENYLEPHRINE 80 MCG/ML (10ML) SYRINGE FOR IV PUSH (FOR BLOOD PRESSURE SUPPORT)
PREFILLED_SYRINGE | INTRAVENOUS | Status: DC | PRN
  Administered 2022-03-03 (×4): 80 ug via INTRAVENOUS

## 2022-03-03 MED ORDER — PROPOFOL 10 MG/ML IV BOLUS
INTRAVENOUS | Status: DC | PRN
  Administered 2022-03-03: 20 mg via INTRAVENOUS

## 2022-03-03 MED ORDER — POLYETHYLENE GLYCOL 3350 17 GM/SCOOP PO POWD
1.0000 | Freq: Once | ORAL | Status: AC
  Administered 2022-03-03: 255 g via ORAL
  Filled 2022-03-03 (×2): qty 255

## 2022-03-03 SURGICAL SUPPLY — 23 items
ELECT REM PT RETURN 9FT ADLT (ELECTROSURGICAL)
ELECTRODE REM PT RTRN 9FT ADLT (ELECTROSURGICAL) IMPLANT
FCP BXJMBJMB 240X2.8X (CUTTING FORCEPS)
FLOOR PAD 36X40 (MISCELLANEOUS) ×1
FORCEPS BIOP RAD 4 LRG CAP 4 (CUTTING FORCEPS) IMPLANT
FORCEPS BIOP RJ4 240 W/NDL (CUTTING FORCEPS)
FORCEPS BXJMBJMB 240X2.8X (CUTTING FORCEPS) IMPLANT
INJECTOR/SNARE I SNARE (MISCELLANEOUS) IMPLANT
LUBRICANT JELLY 4.5OZ STERILE (MISCELLANEOUS) IMPLANT
MANIFOLD NEPTUNE II (INSTRUMENTS) IMPLANT
NDL SCLEROTHERAPY 25GX240 (NEEDLE) IMPLANT
NEEDLE SCLEROTHERAPY 25GX240 (NEEDLE) IMPLANT
PAD FLOOR 36X40 (MISCELLANEOUS) ×2 IMPLANT
PROBE APC STR FIRE (PROBE) IMPLANT
PROBE INJECTION GOLD (MISCELLANEOUS)
PROBE INJECTION GOLD 7FR (MISCELLANEOUS) IMPLANT
SNARE ROTATE MED OVAL 20MM (MISCELLANEOUS) IMPLANT
SYR 50ML LL SCALE MARK (SYRINGE) IMPLANT
TOWEL COTTON PACK 4EA (MISCELLANEOUS) ×4 IMPLANT
TRAP SPECIMEN MUCOUS 40CC (MISCELLANEOUS) IMPLANT
TUBING ENDO SMARTCAP PENTAX (MISCELLANEOUS) IMPLANT
TUBING IRRIGATION ENDOGATOR (MISCELLANEOUS) ×2 IMPLANT
WATER STERILE IRR 1000ML POUR (IV SOLUTION) IMPLANT

## 2022-03-03 NOTE — Progress Notes (Signed)
PROGRESS NOTE    Dominic Kane  TML:[id number] DOB: December 22, 1940 DOA: 03/01/2022 PCP: Jinny Sanders, MD    Brief Narrative:  Dominic Kane is a 81 y.o. 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 Patient also reports that he had red blood per rectum initially, but over the last week stools turned to black.    Assessment and Plan: Acute blood loss anemia secondary to GI bleed Stool guaiacs positive for blood, elevated BUN with drop in hgb  S/p transfused 1 unit of PRBCs due to initial hypotension GI consulted S/p egd: no sign of recent bleeding, possible chronic GI blood loss secondary to gastric erosions Attempted colonoscopy on 12/11, but significantly poor prep, will attempt colonoscopy after repeat prep on 03/04/2022 Continue pantoprazole 39m daily Monitor Hgb   Transient hypotension Still intermittently having low BP Hold home blood pressure regimen includes furosemide 40 mg daily, terazosin 10 mg daily, ramipril 2.5 mg daily. Continue goal MAP greater than 65   Chest discomfort EKG without significant ischemic changes.  Initial high-sensitivity troponin 7.    Resolved   Chronic Systolic CHF BNP 1681Last EF noted to be 30 to 35% with grade 1 diastolic dysfunction back in 2021.  CXR was clear.  Patient does not appear grossly fluid overloaded at this time. Strict I&O's and daily weights Continue to hold Lasix   Controlled diabetes mellitus type 2, without long-term use of insulin Last available hemoglobin A1c 6.8 on 10/22/2021 Patient had been relatively diet-controlled. SSI while in hospital   Hypothyroidism History of papillary thyroid cancer Status post total thyroidectomy for papillary thyroid cancer back in 2018 by Dr.  GHarlow AsaLast TSH 4.48 in 07/22/2021. Continue levothyroxine   Hyperlipidemia Continue Crestor   BPH Continue Proscar    DVT prophylaxis: SCDs Start: 03/01/22 1505    Code Status: Full Code Family Communication: Wife at bedside  Disposition Plan:  Level of care: Med-Surg Status is: Inpatient  The patient will require care spanning > 2 midnights and should be moved to inpatient because: GI bleeding    Consultants:  GI   Subjective: Saw patient after colonoscopy, denies any new complaints. Wife at bedside.  Objective: Vitals:   03/03/22 0855 03/03/22 0912 03/03/22 1144 03/03/22 1707  BP: 108/72 111/62 125/75 103/85  Pulse: 77 77 81 80  Resp: 15 16  (!) 25  Temp: 97.6 F (36.4 C)  97.7 F (36.5 C) 97.8 F (36.6 C)  TempSrc:   Oral Oral  SpO2: 93% 92% 95% 95%  Weight:      Height:        Intake/Output Summary (Last 24 hours) at 03/03/2022 1756 Last data filed at 03/03/2022 1100 Gross per 24 hour  Intake 1630 ml  Output 400 ml  Net 1230 ml   Filed Weights   03/02/22 0830 03/03/22 0500  Weight: 95.3 kg 92.6 kg    Examination:  General: NAD  Cardiovascular: S1, S2 present Respiratory: CTAB Abdomen: Soft, nontender, nondistended, bowel sounds present Musculoskeletal: No bilateral pedal edema noted Skin: Normal Psychiatry: Normal mood       Data Reviewed: I have personally reviewed following labs and imaging studies  CBC: Recent Labs  Lab 03/01/22 1229 03/01/22 1600 03/01/22 2219 03/02/22 0021 03/02/22 1620 03/03/22 0028  WBC 8.3  --   --  8.5  --  9.7  HGB 9.8* 9.9* 9.0* 8.9* 9.4* 8.9*  HCT 30.6* 28.9* 26.6* 27.5* 27.8* 25.9*  MCV 94.2  --   --  92.9  --  91.8  PLT 189  --   --  165  --  056   Basic Metabolic Panel: Recent Labs  Lab 03/01/22 1229 03/02/22 0021 03/03/22 0028  NA 137 138 138  K 4.2 4.2 3.8  CL 106 108 108  CO2 21* 20* 20*  GLUCOSE 156* 99 108*  BUN 30* 28* 26*  CREATININE 1.27* 1.13 0.91  CALCIUM 9.1 8.7* 8.6*    GFR: Estimated Creatinine Clearance: 72.8 mL/min (by C-G formula based on SCr of 0.91 mg/dL). Liver Function Tests: Recent Labs  Lab 03/01/22 1429  AST 21  ALT 15  ALKPHOS 68  BILITOT 0.8  PROT 6.2*  ALBUMIN 3.7   No results for input(s): "LIPASE", "AMYLASE" in the last 168 hours. No results for input(s): "AMMONIA" in the last 168 hours. Coagulation Profile: No results for input(s): "INR", "PROTIME" in the last 168 hours. Cardiac Enzymes: No results for input(s): "CKTOTAL", "CKMB", "CKMBINDEX", "TROPONINI" in the last 168 hours. BNP (last 3 results) No results for input(s): "PROBNP" in the last 8760 hours. HbA1C: No results for input(s): "HGBA1C" in the last 72 hours. CBG: Recent Labs  Lab 03/02/22 2148 03/03/22 0618 03/03/22 0834 03/03/22 1144 03/03/22 1707  GLUCAP 126* 117* 118* 134* 146*   Lipid Profile: No results for input(s): "CHOL", "HDL", "LDLCALC", "TRIG", "CHOLHDL", "LDLDIRECT" in the last 72 hours. Thyroid Function Tests: Recent Labs    03/01/22 1610  TSH 0.707   Anemia Panel: Recent Labs    03/03/22 0028  FERRITIN 23*  TIBC 315  IRON 40*   Sepsis Labs: No results for input(s): "PROCALCITON", "LATICACIDVEN" in the last 168 hours.  Recent Results (from the past 240 hour(s))  MRSA Next Gen by PCR, Nasal     Status: None   Collection Time: 03/01/22  5:10 PM   Specimen: Nasal Mucosa; Nasal Swab  Result Value Ref Range Status   MRSA by PCR Next Gen NOT DETECTED NOT DETECTED Final    Comment: (NOTE) The GeneXpert MRSA Assay (FDA approved for NASAL specimens only), is one component of a comprehensive MRSA colonization surveillance program. It is not intended to diagnose MRSA infection nor to guide or monitor treatment for MRSA infections. Test performance is not FDA approved in patients less than 42 years old. Performed at [facility], [place] [address], [place], [place] [address]          Radiology Studies: No results  found.      Scheduled Meds:  finasteride  5 mg Oral Daily   insulin aspart  0-15 Units Subcutaneous TID WC   levothyroxine  150 mcg Oral Q0600   melatonin  5 mg Oral QHS   pantoprazole  40 mg Oral Daily   peg 3350 powder  1 kit Oral Once   rosuvastatin  10 mg Oral Daily   sodium chloride flush  3 mL Intravenous Q12H   Continuous Infusions:     LOS: 1 day    Alma Friendly, MD [facility] Available via Epic secure chat 7am-7pm After these hours, please refer to coverage provider listed on [email or web address] 03/03/2022, 5:56 PM

## 2022-03-03 NOTE — Progress Notes (Signed)
Mobility Specialist Progress Note    03/03/22 1554  Mobility  Activity Ambulated independently in hallway  Level of Assistance Independent  Assistive Device None  Distance Ambulated (ft) 250 ft  Activity Response Tolerated well  Mobility Referral Yes  $Mobility charge 1 Mobility   Pre-Mobility: 77 HR During Mobility: 106 HR Post-Mobility: 85 HR  Pt received in chair and agreeable. No complaints on walk. Returned to chair with call bell in reach.    Hildred Alamin Mobility Specialist  Please Psychologist, sport and exercise or Rehab Office at [phone]

## 2022-03-03 NOTE — Anesthesia Procedure Notes (Signed)
Procedure Name: MAC Date/Time: 03/03/2022 8:12 AM  Performed by: Carolan Clines, CRNAPre-anesthesia Checklist: Emergency Drugs available, Patient identified, Suction available and Patient being monitored Patient Re-evaluated:Patient Re-evaluated prior to induction Oxygen Delivery Method: Simple face mask Dental Injury: Teeth and Oropharynx as per pre-operative assessment

## 2022-03-03 NOTE — Transfer of Care (Signed)
Immediate Anesthesia Transfer of Care Note  Patient: Dominic Kane  Procedure(s) Performed: INVASIVE LAB ABORTED CASE  Patient Location: PACU  Anesthesia Type:MAC  Level of Consciousness: awake, alert , and oriented  Airway & Oxygen Therapy: Patient Spontanous Breathing  Post-op Assessment: Report given to RN and Post -op Vital signs reviewed and stable  Post vital signs: Reviewed and stable  Last Vitals:  Vitals Value Taken Time  BP 102/55 03/03/22 0833  Temp    Pulse 90 03/03/22 0835  Resp 27 03/03/22 0835  SpO2 96 % 03/03/22 0835  Vitals shown include unvalidated device data.  Last Pain:  Vitals:   03/03/22 0747  TempSrc: Oral  PainSc: 3          Complications: No notable events documented.

## 2022-03-03 NOTE — Anesthesia Postprocedure Evaluation (Signed)
Anesthesia Post Note  Patient: Marice Angelino Valenta  Procedure(s) Performed: INVASIVE LAB ABORTED CASE     Patient location during evaluation: PACU Anesthesia Type: MAC Level of consciousness: awake and alert Pain management: pain level controlled Vital Signs Assessment: post-procedure vital signs reviewed and stable Respiratory status: spontaneous breathing, nonlabored ventilation, respiratory function stable and patient connected to nasal cannula oxygen Cardiovascular status: stable and blood pressure returned to baseline Postop Assessment: no apparent nausea or vomiting Anesthetic complications: no  No notable events documented.  Last Vitals:  Vitals:   03/03/22 0855 03/03/22 0912  BP: 108/72 111/62  Pulse: 77 77  Resp: 15 16  Temp: 36.4 C   SpO2: 93% 92%    Last Pain:  Vitals:   03/03/22 1100  TempSrc:   PainSc: 0-No pain                 Sanaiyah Kirchhoff L Cassidy Tabet

## 2022-03-03 NOTE — Op Note (Signed)
[facility] Patient Name: Dominic Kane Procedure Date : 03/03/2022 MRN: [id number] Attending MD: Carlota Raspberry. Havery Moros , MD, [id number] Date of Birth: 1940/09/17 CSN: [id number] Age: 81 Admit Type: Inpatient Procedure:                Colonoscopy Indications:              dark stools, iron deficiency anemia - EGD with some                            gastric erosions but thought to be less likely                            source for symptoms / anemia. Colonoscopy to                            further evaluate Providers:                Remo Lipps P. Havery Moros, MD, Benay Pillow, RN,                            Abdulaziz Dalton, Technician Referring MD:              Medicines:                Monitored Anesthesia Care Complications:            No immediate complications. Estimated blood loss:                            None. Estimated Blood Loss:     Estimated blood loss: none. Procedure:                Pre-Anesthesia Assessment:                           - Prior to the procedure, a History and Physical                            was performed, and patient medications and                            allergies were reviewed. The patient's tolerance of                            previous anesthesia was also reviewed. The risks                            and benefits of the procedure and the sedation                            options and risks were discussed with the patient.                            All questions were answered, and informed consent  was obtained. Prior Anticoagulants: The patient has                            taken no anticoagulant or antiplatelet agents. ASA                            Grade Assessment: III - A patient with severe                            systemic disease. After reviewing the risks and                            benefits, the patient was deemed in satisfactory                            condition to undergo the  procedure.                           After obtaining informed consent, the colonoscope                            was passed under direct vision. Throughout the                            procedure, the patient's blood pressure, pulse, and                            oxygen saturations were monitored continuously. The                            [id number]-HQ190L ([id number]) Olympus coloscope was                            introduced through the anus with the intention of                            advancing to the cecum. The scope was advanced to                            the sigmoid colon before the procedure was aborted.                            Medications were given. The colonoscopy was                            technically difficult and complex due to inadequate                            bowel prep. The patient tolerated the procedure                            well. The quality of the bowel preparation was  poor. No anatomical landmarks were photographed. Scope In: 8:18:04 AM Scope Out: 8:18:57 AM Total Procedure Duration: 0 hours 0 minutes 53 seconds  Findings:      The perianal and digital rectal examinations were normal.      A large amount of semi-solid brown stool was found in the entire colon,       precluding visualization. Could not lavage this well enough to visualize       and traverse the colon, extent reached distal sigmoid colon. Impression:               - Preparation of the colon was poor.                           - Stool in the entire examined colon.                           - Procedure aborted. Recommendation:           - Return patient to hospital ward for ongoing care.                           - If patient is willing, clear liquid diet today                            followed by bowel prep today / this evening, repeat                            colonoscopy attempt tomorrow.                           - Continue present medications.                            - Repeat colonoscopy tomorrow because the bowel                            preparation was suboptimal. Will discuss plan with                            the patient Procedure Code(s):        --- Professional ---                           [phone], 53, Colonoscopy, flexible; diagnostic,                            including collection of specimen(s) by brushing or                            washing, when performed (separate procedure) Diagnosis Code(s):        --- Professional ---                           K92.1, Melena (includes Hematochezia)                           D50.9, Iron deficiency anemia,  unspecified CPT copyright 2022 American Medical Association. All rights reserved. The codes documented in this report are preliminary and upon coder review may  be revised to meet current compliance requirements. Remo Lipps P. Havery Moros, MD 03/03/2022 8:26:22 AM This report has been signed electronically. Number of Addenda: 0

## 2022-03-03 NOTE — TOC Progression Note (Signed)
Transition of Care [facility]) - Progression Note    Patient Details  Name: Dominic Kane MRN: [id number] Date of Birth: 01/24/1941  [facility] CM/SW Contact  Zenon Mayo, RN Phone Number: 03/03/2022, 2:36 PM  Clinical Narrative:    From home, presents with GIB, for capsule study.  TOC following.        Expected Discharge Plan and Services                                                 Social Determinants of Health (SDOH) Interventions    Readmission Risk Interventions     No data to display

## 2022-03-03 NOTE — Interval H&P Note (Signed)
History and Physical Interval Note: EGD yesterday showed some gastric erosions but not convinced this is causing entire IDA / anemia. He did bowel prep overnight, no blood per rectum or dark stools per patient. Colonoscopy planned today to see if we can identify cause for IDA / blood loss. I have counseled him on this and he wishes to proceed after discussion of risks / benefits. If this exam is negative may pursue capsule endoscopy. He agrees with the plan as outlined. Exam unchanged, he feels well today, no cardiopulmonary symptoms.   03/03/2022 7:44 AM  Dominic Kane  has presented today for surgery, with the diagnosis of Anemia, GI bleed.  The various methods of treatment have been discussed with the patient and family. After consideration of risks, benefits and other options for treatment, the patient has consented to  Procedure(s): COLONOSCOPY WITH PROPOFOL (N/A) GIVENS CAPSULE STUDY (N/A) as a surgical intervention.  The patient's history has been reviewed, patient examined, no change in status, stable for surgery.  I have reviewed the patient's chart and labs.  Questions were answered to the patient's satisfaction.     [place]

## 2022-03-03 NOTE — Anesthesia Preprocedure Evaluation (Addendum)
Anesthesia Evaluation  Patient identified by MRN, date of birth, ID band Patient awake    Reviewed: Allergy & Precautions, NPO status , Patient's Chart, lab work & pertinent test results  Airway Mallampati: II  TM Distance: >3 FB Neck ROM: Full    Dental  (+) Dental Advisory Given, Missing,    Pulmonary former smoker   Pulmonary exam normal breath sounds clear to auscultation       Cardiovascular hypertension, Pt. on medications + CAD and +CHF  Normal cardiovascular exam Rhythm:Regular Rate:Normal  TTE 2021  1. Left ventricular ejection fraction, by estimation, is 30 to 35%. The  left ventricle has moderately decreased function. The left ventricle has  no regional wall motion abnormalities. There is mild concentric left  ventricular hypertrophy. Left  ventricular diastolic parameters are consistent with Grade I diastolic  dysfunction (impaired relaxation). Elevated left ventricular end-diastolic  pressure.   2. Right ventricular systolic function is normal. The right ventricular  size is normal. There is normal pulmonary artery systolic pressure.   3. Left atrial size was moderately dilated.   4. The mitral valve is normal in structure. Trivial mitral valve  regurgitation. No evidence of mitral stenosis.   5. The aortic valve is tricuspid. Aortic valve regurgitation is mild. No  aortic stenosis is present.   6. Aortic dilatation noted. 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   7. The inferior vena cava is normal in size with greater than 50%  respiratory variability, suggesting right atrial pressure of 3 mmHg.     Neuro/Psych  Headaches  negative psych ROS   GI/Hepatic Neg liver ROS,GERD  ,,  Endo/Other  diabetesHypothyroidism    Renal/GU negative Renal ROS  negative genitourinary   Musculoskeletal  (+) Arthritis ,    Abdominal   Peds  Hematology  (+) Blood dyscrasia, anemia Lab  Results      Component                Value               Date                      WBC                      9.7                 03/03/2022                HGB                      8.9 (L)             03/03/2022                HCT                      25.9 (L)            03/03/2022                MCV                      91.8                03/03/2022                PLT  174                 03/03/2022              Anesthesia Other Findings   Reproductive/Obstetrics                             Anesthesia Physical Anesthesia Plan  ASA: 3  Anesthesia Plan: MAC   Post-op Pain Management:    Induction: Intravenous  PONV Risk Score and Plan: Propofol infusion and Treatment may vary due to age or medical condition  Airway Management Planned: Natural Airway  Additional Equipment:   Intra-op Plan:   Post-operative Plan:   Informed Consent: I have reviewed the patients History and Physical, chart, labs and discussed the procedure including the risks, benefits and alternatives for the proposed anesthesia with the patient or authorized representative who has indicated his/her understanding and acceptance.     Dental advisory given  Plan Discussed with: CRNA  Anesthesia Plan Comments:        Anesthesia Quick Evaluation

## 2022-03-03 NOTE — Plan of Care (Signed)
  Problem: Education: Goal: Knowledge of General Education information will improve Description: Including pain rating scale, medication(s)/side effects and non-pharmacologic comfort measures Outcome: Progressing   Problem: Health Behavior/Discharge Planning: Goal: Ability to manage health-related needs will improve Outcome: Progressing   Problem: Clinical Measurements: Goal: Will remain free from infection Outcome: Progressing Goal: Respiratory complications will improve Outcome: Progressing Goal: Cardiovascular complication will be avoided Outcome: Progressing   Problem: Activity: Goal: Risk for activity intolerance will decrease Outcome: Progressing   Problem: Nutrition: Goal: Adequate nutrition will be maintained Outcome: Progressing

## 2022-03-04 ENCOUNTER — Encounter (HOSPITAL_COMMUNITY): Payer: Self-pay | Admitting: Internal Medicine

## 2022-03-04 ENCOUNTER — Inpatient Hospital Stay (HOSPITAL_COMMUNITY): Payer: PPO

## 2022-03-04 ENCOUNTER — Inpatient Hospital Stay (HOSPITAL_COMMUNITY): Payer: PPO | Admitting: Certified Registered Nurse Anesthetist

## 2022-03-04 ENCOUNTER — Encounter (HOSPITAL_COMMUNITY)
Admission: EM | Disposition: A | Discharge status: Home / Self Care | Payer: Self-pay | Source: Home / Self Care | Attending: Internal Medicine

## 2022-03-04 DIAGNOSIS — D62 Acute posthemorrhagic anemia: Secondary | ICD-10-CM | POA: Diagnosis not present

## 2022-03-04 DIAGNOSIS — I509 Heart failure, unspecified: Secondary | ICD-10-CM

## 2022-03-04 DIAGNOSIS — D120 Benign neoplasm of cecum: Secondary | ICD-10-CM

## 2022-03-04 DIAGNOSIS — I110 Hypertensive heart disease with heart failure: Secondary | ICD-10-CM

## 2022-03-04 DIAGNOSIS — D122 Benign neoplasm of ascending colon: Secondary | ICD-10-CM

## 2022-03-04 DIAGNOSIS — D509 Iron deficiency anemia, unspecified: Secondary | ICD-10-CM

## 2022-03-04 DIAGNOSIS — K648 Other hemorrhoids: Secondary | ICD-10-CM

## 2022-03-04 DIAGNOSIS — K5731 Diverticulosis of large intestine without perforation or abscess with bleeding: Secondary | ICD-10-CM

## 2022-03-04 DIAGNOSIS — K922 Gastrointestinal hemorrhage, unspecified: Secondary | ICD-10-CM | POA: Diagnosis not present

## 2022-03-04 DIAGNOSIS — I2510 Atherosclerotic heart disease of native coronary artery without angina pectoris: Secondary | ICD-10-CM

## 2022-03-04 HISTORY — PX: COLONOSCOPY WITH PROPOFOL: SHX5780

## 2022-03-04 LAB — BASIC METABOLIC PANEL
Anion gap: 10 (ref 5–15)
BUN: 17 mg/dL (ref 8–23)
CO2: 22 mmol/L (ref 22–32)
Calcium: 8.5 mg/dL — ABNORMAL LOW (ref 8.9–10.3)
Chloride: 111 mmol/L (ref 98–111)
Creatinine, Ser: 0.85 mg/dL (ref 0.61–1.24)
GFR, Estimated: >60 mL/min (ref >60)
Glucose, Bld: 114 mg/dL — ABNORMAL HIGH (ref 70–99)
Potassium: 3.3 mmol/L — ABNORMAL LOW (ref 3.5–5.1)
Sodium: 143 mmol/L (ref 135–145)

## 2022-03-04 LAB — HEMOGLOBIN AND HEMATOCRIT, BLOOD
HCT: 23.8 % — ABNORMAL LOW (ref 39.0–52.0)
HCT: 26 % — ABNORMAL LOW (ref 39.0–52.0)
Hemoglobin: 7.8 g/dL — ABNORMAL LOW (ref 13.0–17.0)
Hemoglobin: 8.4 g/dL — ABNORMAL LOW (ref 13.0–17.0)

## 2022-03-04 LAB — CBC
HCT: 23.8 % — ABNORMAL LOW (ref 39.0–52.0)
Hemoglobin: 8.1 g/dL — ABNORMAL LOW (ref 13.0–17.0)
MCH: 31.3 pg (ref 26.0–34.0)
MCHC: 34 g/dL (ref 30.0–36.0)
MCV: 91.9 fL (ref 80.0–100.0)
Platelets: 159 10*3/uL (ref 150–400)
RBC: 2.59 MIL/uL — ABNORMAL LOW (ref 4.22–5.81)
RDW: 13.7 % (ref 11.5–15.5)
WBC: 7.4 10*3/uL (ref 4.0–10.5)
nRBC: 0 % (ref 0.0–0.2)

## 2022-03-04 LAB — GLUCOSE, CAPILLARY
Glucose-Capillary: 101 mg/dL — ABNORMAL HIGH (ref 70–99)
Glucose-Capillary: 106 mg/dL — ABNORMAL HIGH (ref 70–99)
Glucose-Capillary: 112 mg/dL — ABNORMAL HIGH (ref 70–99)
Glucose-Capillary: 117 mg/dL — ABNORMAL HIGH (ref 70–99)
Glucose-Capillary: 110 mg/dL — ABNORMAL HIGH (ref 70–99)

## 2022-03-04 LAB — PREPARE RBC (CROSSMATCH)

## 2022-03-04 SURGERY — COLONOSCOPY WITH PROPOFOL
Anesthesia: Monitor Anesthesia Care

## 2022-03-04 MED ORDER — POTASSIUM CHLORIDE CRYS ER 20 MEQ PO TBCR
40.0000 meq | EXTENDED_RELEASE_TABLET | Freq: Once | ORAL | Status: AC
  Administered 2022-03-04: 40 meq via ORAL
  Filled 2022-03-04 (×2): qty 2

## 2022-03-04 MED ORDER — SODIUM CHLORIDE 0.9% IV SOLUTION
Freq: Once | INTRAVENOUS | Status: AC
  Administered 2022-03-05: 10 mL via INTRAVENOUS

## 2022-03-04 MED ORDER — PROPOFOL 500 MG/50ML IV EMUL
INTRAVENOUS | Status: DC | PRN
  Administered 2022-03-04: 100 ug/kg/min via INTRAVENOUS

## 2022-03-04 MED ORDER — PHENYLEPHRINE HCL-NACL 20-0.9 MG/250ML-% IV SOLN
INTRAVENOUS | Status: DC | PRN
  Administered 2022-03-04: 25 ug/min via INTRAVENOUS

## 2022-03-04 MED ORDER — ALBUMIN HUMAN 5 % IV SOLN: INTRAVENOUS | Status: DC | PRN

## 2022-03-04 MED ORDER — PROPOFOL 10 MG/ML IV BOLUS
INTRAVENOUS | Status: DC | PRN
  Administered 2022-03-04: 25 mg via INTRAVENOUS

## 2022-03-04 MED ORDER — IOHEXOL 350 MG/ML SOLN
100.0000 mL | Freq: Once | INTRAVENOUS | Status: AC | PRN
  Administered 2022-03-04: 100 mL via INTRAVENOUS

## 2022-03-04 MED ORDER — EPHEDRINE SULFATE-NACL 50-0.9 MG/10ML-% IV SOSY
PREFILLED_SYRINGE | INTRAVENOUS | Status: DC | PRN
  Administered 2022-03-04 (×2): 5 mg via INTRAVENOUS
  Administered 2022-03-04: 2.5 mg via INTRAVENOUS
  Administered 2022-03-04 (×2): 5 mg via INTRAVENOUS

## 2022-03-04 MED ORDER — LIDOCAINE 2% (20 MG/ML) 5 ML SYRINGE
INTRAMUSCULAR | Status: DC | PRN
  Administered 2022-03-04: 40 mg via INTRAVENOUS

## 2022-03-04 MED ORDER — LACTATED RINGERS IV SOLN
INTRAVENOUS | Status: AC | PRN
  Administered 2022-03-04: 1000 mL via INTRAVENOUS

## 2022-03-04 MED ORDER — PHENYLEPHRINE 80 MCG/ML (10ML) SYRINGE FOR IV PUSH (FOR BLOOD PRESSURE SUPPORT)
PREFILLED_SYRINGE | INTRAVENOUS | Status: DC | PRN
  Administered 2022-03-04: 160 ug via INTRAVENOUS
  Administered 2022-03-04: 80 ug via INTRAVENOUS
  Administered 2022-03-04 (×2): 160 ug via INTRAVENOUS
  Administered 2022-03-04: 40 ug via INTRAVENOUS
  Administered 2022-03-04: 120 ug via INTRAVENOUS
  Administered 2022-03-04: 80 ug via INTRAVENOUS

## 2022-03-04 SURGICAL SUPPLY — 22 items

## 2022-03-04 NOTE — Anesthesia Procedure Notes (Signed)
Procedure Name: MAC Date/Time: 03/04/2022 8:22 AM  Performed by: Colin Benton, CRNAPre-anesthesia Checklist: Patient identified, Emergency Drugs available, Suction available and Patient being monitored Oxygen Delivery Method: Nasal cannula Induction Type: IV induction Placement Confirmation: positive ETCO2 Dental Injury: Teeth and Oropharynx as per pre-operative assessment

## 2022-03-04 NOTE — Plan of Care (Signed)
  Problem: Education: Goal: Knowledge of General Education information will improve Description: Including pain rating scale, medication(s)/side effects and non-pharmacologic comfort measures Outcome: Progressing   Problem: Health Behavior/Discharge Planning: Goal: Ability to manage health-related needs will improve Outcome: Progressing   Problem: Clinical Measurements: Goal: Ability to maintain clinical measurements within normal limits will improve Outcome: Progressing Goal: Will remain free from infection Outcome: Progressing Goal: Respiratory complications will improve Outcome: Progressing Goal: Cardiovascular complication will be avoided Outcome: Progressing   Problem: Activity: Goal: Risk for activity intolerance will decrease Outcome: Progressing   Problem: Nutrition: Goal: Adequate nutrition will be maintained Outcome: Progressing   Problem: Coping: Goal: Level of anxiety will decrease Outcome: Progressing   Problem: Elimination: Goal: Will not experience complications related to bowel motility Outcome: Progressing Goal: Will not experience complications related to urinary retention Outcome: Progressing   Problem: Pain Managment: Goal: General experience of comfort will improve Outcome: Progressing   Problem: Safety: Goal: Ability to remain free from injury will improve Outcome: Progressing   

## 2022-03-04 NOTE — Anesthesia Postprocedure Evaluation (Signed)
Anesthesia Post Note  Patient: Dominic Kane  Procedure(s) Performed: COLONOSCOPY WITH PROPOFOL     Patient location during evaluation: Endoscopy Anesthesia Type: MAC Level of consciousness: awake Pain management: pain level controlled Vital Signs Assessment: post-procedure vital signs reviewed and stable Respiratory status: spontaneous breathing Cardiovascular status: stable Postop Assessment: adequate PO intake Anesthetic complications: no   No notable events documented.  Last Vitals:  Vitals:   03/04/22 1045 03/04/22 1109  BP:  112/67  Pulse:  61  Resp:  15  Temp: 36.5 C 36.7 C  SpO2:      Last Pain:  Vitals:   03/04/22 1109  TempSrc: Oral  PainSc: 0-No pain                 Jesus Nevills

## 2022-03-04 NOTE — Progress Notes (Signed)
PROGRESS NOTE    Dominic Kane  VZC:[id number] DOB: 1940/06/02 DOA: 03/01/2022 PCP: Jinny Sanders, MD    Brief Narrative:  Dominic Kane is a 81 y.o. 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 Patient also reports that he had red blood per rectum initially, but over the last week stools turned to black.    Assessment and Plan: Acute blood loss anemia secondary to GI bleed Stool guaiacs positive for blood, elevated BUN with drop in hgb  S/p transfused 1 unit of PRBCs due to initial hypotension, received another 1U of PRBC on 12/12 GI consulted S/p egd: no sign of recent bleeding, possible chronic GI blood loss secondary to gastric erosions Attempted colonoscopy on 12/11, but significantly poor prep, repeat colonoscopy on 12/12 showed significant blood clot burden in the entire large colon.  GI recommended CTA by IR and another unit of blood transfusion CT angio GI bleed showed no definitive evidence of active GI bleeding Continue pantoprazole '40mg'$  daily Monitor Hgb  Hypokalemia Replace as needed   Transient hypotension Hold home blood pressure regimen includes furosemide 40 mg daily, terazosin 10 mg daily, ramipril 2.5 mg daily May need to restart Lasix pending clinical status as patient has systolic CHF Continue goal MAP greater than 65   Chest discomfort EKG without significant ischemic changes.  Initial high-sensitivity troponin 7.    Resolved   Chronic Systolic CHF BNP 128 Last EF noted to be 30 to 35% with grade 1 diastolic dysfunction back in 2021 CXR was clear.  Strict I&O's and daily weights Continue to hold Lasix pending clinical status   Controlled diabetes mellitus type 2, without long-term use of insulin Last  available hemoglobin A1c 6.8 on 10/22/2021 Patient had been relatively diet-controlled. SSI while in hospital   Hypothyroidism History of papillary thyroid cancer Status post total thyroidectomy for papillary thyroid cancer back in 2018 by Dr. Harlow Asa Last TSH 4.48 in 07/22/2021. Continue levothyroxine   Hyperlipidemia Continue Crestor   BPH Continue Proscar    DVT prophylaxis: SCDs Start: 03/01/22 1505    Code Status: Full Code Family Communication: Wife at bedside  Disposition Plan:  Level of care: Med-Surg Status is: Inpatient  The patient will require care spanning > 2 midnights and should be moved to inpatient because: GI bleeding    Consultants:  GI   Subjective: Saw patient after colonoscopy this morning, denies any new complaints.  Patient was receiving blood transfusion   Objective: Vitals:   03/04/22 1200 03/04/22 1341 03/04/22 1500 03/04/22 1600  BP: 115/82 121/65 124/64   Pulse: 60  61 64  Resp: (!) '21  17 17  '$ Temp:  98.2 F (36.8 C) 98.1 F (36.7 C)   TempSrc:  Oral Oral   SpO2: 100%  99% 99%  Weight:      Height:        Intake/Output Summary (Last 24 hours) at 03/04/2022 1848 Last data filed at 03/04/2022 1341 Gross per 24 hour  Intake 998 ml  Output --  Net 998 ml   Filed Weights   03/02/22 0830 03/03/22 0500 03/04/22 0500  Weight: 95.3 kg 92.6 kg 92.7 kg    Examination:  General: NAD  Cardiovascular: S1, S2 present Respiratory: CTAB Abdomen: Soft, nontender, nondistended, bowel sounds present Musculoskeletal: No bilateral  pedal edema noted Skin: Normal Psychiatry: Normal mood       Data Reviewed: I have personally reviewed following labs and imaging studies  CBC: Recent Labs  Lab 03/01/22 1229 03/01/22 1600 03/02/22 0021 03/02/22 1620 03/03/22 0028 03/03/22 2143 03/04/22 0019 03/04/22 1411 03/04/22 1641  WBC 8.3  --  8.5  --  9.7  --  7.4  --   --   HGB 9.8*   < > 8.9*   < > 8.9* 8.2* 8.1* 7.8* 8.4*  HCT 30.6*   <  > 27.5*   < > 25.9* 23.9* 23.8* 23.8* 26.0*  MCV 94.2  --  92.9  --  91.8  --  91.9  --   --   PLT 189  --  165  --  174  --  159  --   --    < > = values in this interval not displayed.   Basic Metabolic Panel: Recent Labs  Lab 03/01/22 1229 03/02/22 0021 03/03/22 0028 03/04/22 0019  NA 137 138 138 143  K 4.2 4.2 3.8 3.3*  CL 106 108 108 111  CO2 21* 20* 20* 22  GLUCOSE 156* 99 108* 114*  BUN 30* 28* 26* 17  CREATININE 1.27* 1.13 0.91 0.85  CALCIUM 9.1 8.7* 8.6* 8.5*   GFR: Estimated Creatinine Clearance: 78 mL/min (by C-G formula based on SCr of 0.85 mg/dL). Liver Function Tests: Recent Labs  Lab 03/01/22 1429  AST 21  ALT 15  ALKPHOS 68  BILITOT 0.8  PROT 6.2*  ALBUMIN 3.7   No results for input(s): "LIPASE", "AMYLASE" in the last 168 hours. No results for input(s): "AMMONIA" in the last 168 hours. Coagulation Profile: No results for input(s): "INR", "PROTIME" in the last 168 hours. Cardiac Enzymes: No results for input(s): "CKTOTAL", "CKMB", "CKMBINDEX", "TROPONINI" in the last 168 hours. BNP (last 3 results) No results for input(s): "PROBNP" in the last 8760 hours. HbA1C: No results for input(s): "HGBA1C" in the last 72 hours. CBG: Recent Labs  Lab 03/03/22 2154 03/04/22 0615 03/04/22 0918 03/04/22 1132 03/04/22 1642  GLUCAP 78 101* 106* 112* 117*   Lipid Profile: No results for input(s): "CHOL", "HDL", "LDLCALC", "TRIG", "CHOLHDL", "LDLDIRECT" in the last 72 hours. Thyroid Function Tests: No results for input(s): "TSH", "T4TOTAL", "FREET4", "T3FREE", "THYROIDAB" in the last 72 hours.  Anemia Panel: Recent Labs    03/03/22 0028  FERRITIN 23*  TIBC 315  IRON 40*   Sepsis Labs: No results for input(s): "PROCALCITON", "LATICACIDVEN" in the last 168 hours.  Recent Results (from the past 240 hour(s))  MRSA Next Gen by PCR, Nasal     Status: None   Collection Time: 03/01/22  5:10 PM   Specimen: Nasal Mucosa; Nasal Swab  Result Value Ref Range  Status   MRSA by PCR Next Gen NOT DETECTED NOT DETECTED Final    Comment: (NOTE) The GeneXpert MRSA Assay (FDA approved for NASAL specimens only), is one component of a comprehensive MRSA colonization surveillance program. It is not intended to diagnose MRSA infection nor to guide or monitor treatment for MRSA infections. Test performance is not FDA approved in patients less than 2 years old. Performed at [facility], [facility] [address] [place], [place] [address]          Radiology Studies: CT ANGIO GI BLEED  Result Date: 03/04/2022 CLINICAL DATA:  Lower gastrointestinal bleeding. EXAM: CTA ABDOMEN AND PELVIS WITHOUT AND WITH CONTRAST TECHNIQUE: Multidetector CT imaging of the abdomen and  pelvis was performed using the standard protocol during bolus administration of intravenous contrast. Multiplanar reconstructed images and MIPs were obtained and reviewed to evaluate the vascular anatomy. RADIATION DOSE REDUCTION: This exam was performed according to the departmental dose-optimization program which includes automated exposure control, adjustment of the mA and/or kV according to patient size and/or use of iterative reconstruction technique. CONTRAST:  116m OMNIPAQUE IOHEXOL 350 MG/ML SOLN COMPARISON:  January 23, 2013. FINDINGS: VASCULAR Aorta: Atherosclerosis of abdominal aorta is noted without aneurysm or dissection. Celiac: Patent without evidence of aneurysm, dissection, vasculitis or significant stenosis. SMA: Patent without evidence of aneurysm, dissection, vasculitis or significant stenosis. Renals: Both renal arteries are patent without evidence of aneurysm, dissection, vasculitis, fibromuscular dysplasia or significant stenosis. IMA: Patent without evidence of aneurysm, dissection, vasculitis or significant stenosis. Inflow: Patent without evidence of aneurysm, dissection, vasculitis or significant stenosis. Proximal Outflow: Bilateral common femoral and visualized portions of the  superficial and profunda femoral arteries are patent without evidence of aneurysm, dissection, vasculitis or significant stenosis. Veins: No obvious venous abnormality within the limitations of this arterial phase study. Review of the MIP images confirms the above findings. NON-VASCULAR Lower chest: No acute abnormality. Hepatobiliary: No focal liver abnormality is seen. No gallstones, gallbladder wall thickening, or biliary dilatation. Pancreas: Unremarkable. No pancreatic ductal dilatation or surrounding inflammatory changes. Spleen: Normal in size without focal abnormality. Adrenals/Urinary Tract: Adrenal glands appear normal. Bilateral renal cysts are noted for which no further follow-up is required. No hydronephrosis or renal obstruction is noted. Urinary bladder is unremarkable. Stomach/Bowel: Stomach is unremarkable. Diverticulosis of descending and sigmoid colon is noted without inflammation. No definite evidence of active gastrointestinal bleeding is noted. The appendix is unremarkable. Lymphatic: No adenopathy is noted. Reproductive: Prostate is unremarkable. Other: No abdominal wall hernia or abnormality. No abdominopelvic ascites. Musculoskeletal: No acute or significant osseous findings. IMPRESSION: VASCULAR Atherosclerosis of abdominal aorta is noted without aneurysm or dissection. No significant mesenteric or renal artery stenosis is noted. No definite evidence of active gastrointestinal bleeding. NON-VASCULAR Diverticulosis of descending and sigmoid colon is noted without inflammation. Electronically Signed   By: JMarijo ConceptionM.D.   On: 03/04/2022 13:10        Scheduled Meds:  sodium chloride   Intravenous Once   finasteride  5 mg Oral Daily   insulin aspart  0-15 Units Subcutaneous TID WC   levothyroxine  150 mcg Oral Q0600   melatonin  5 mg Oral QHS   pantoprazole  40 mg Oral Daily   rosuvastatin  10 mg Oral Daily   sodium chloride flush  3 mL Intravenous Q12H   Continuous  Infusions:     LOS: 2 days    NAlma Friendly MD [facility] Available via Epic secure chat 7am-7pm After these hours, please refer to coverage provider listed on [email or web address] 03/04/2022, 6:48 PM

## 2022-03-04 NOTE — Interval H&P Note (Signed)
History and Physical Interval Note: Patient here for repeat colonoscopy attempt. Yesterday we tried and he had a poor prep. Drank Miralax yesterday + bowel prep- states it cleared him out but had some rectal bleeding this AM. Otherwise feels bloated / sore from prep but feeling okay. Hgb 8.1. I have discussed risks / benefits of the exam with him and he wants to proceed. If the exam is normal, without clear cause, may place capsule afterwards to clear small bowel. He is agreeable to this if needed.   03/04/2022 7:39 AM  Dominic Kane  has presented today for surgery, with the diagnosis of iron deficiency, dark stools, negative EGD.  The various methods of treatment have been discussed with the patient and family. After consideration of risks, benefits and other options for treatment, the patient has consented to  Procedure(s): COLONOSCOPY WITH PROPOFOL (N/A) as a surgical intervention.  The patient's history has been reviewed, patient examined, no change in status, stable for surgery.  I have reviewed the patient's chart and labs.  Questions were answered to the patient's satisfaction.     [place]

## 2022-03-04 NOTE — Anesthesia Preprocedure Evaluation (Signed)
Anesthesia Evaluation  Patient identified by MRN, date of birth, ID band Patient awake    Reviewed: Allergy & Precautions, NPO status , Patient's Chart, lab work & pertinent test results  Airway Mallampati: II       Dental   Pulmonary former smoker   breath sounds clear to auscultation       Cardiovascular hypertension, + CAD and +CHF   Rhythm:Regular Rate:Normal     Neuro/Psych  Headaches  Neuromuscular disease    GI/Hepatic Neg liver ROS,GERD  ,,  Endo/Other  diabetesHypothyroidism    Renal/GU negative Renal ROS     Musculoskeletal  (+) Arthritis ,    Abdominal   Peds  Hematology   Anesthesia Other Findings   Reproductive/Obstetrics                             Anesthesia Physical Anesthesia Plan  ASA: 3  Anesthesia Plan: MAC   Post-op Pain Management:    Induction: Intravenous  PONV Risk Score and Plan: 1 and Propofol infusion  Airway Management Planned: Nasal Cannula and Simple Face Mask  Additional Equipment:   Intra-op Plan:   Post-operative Plan:   Informed Consent: I have reviewed the patients History and Physical, chart, labs and discussed the procedure including the risks, benefits and alternatives for the proposed anesthesia with the patient or authorized representative who has indicated his/her understanding and acceptance.     Dental advisory given  Plan Discussed with: CRNA and Anesthesiologist  Anesthesia Plan Comments:        Anesthesia Quick Evaluation

## 2022-03-04 NOTE — Progress Notes (Signed)
Mobility Specialist Progress Note    03/04/22 1559  Mobility  Activity Ambulated independently in hallway  Level of Assistance Standby assist, set-up cues, supervision of patient - no hands on  Assistive Device None  Distance Ambulated (ft) 400 ft  Activity Response Tolerated well  Mobility Referral Yes  $Mobility charge 1 Mobility   Pre-Mobility: 72 HR, 99% SpO2 During Mobility: 102 HR Post-Mobility: 91 HR  Pt received in bed and agreeable. No complaints. Tolerated on RA. Returned to sitting at sink for bath with wife present. RN and NT aware.   Hildred Alamin Mobility Specialist  Please Psychologist, sport and exercise or Rehab Office at [phone]

## 2022-03-04 NOTE — Op Note (Signed)
[facility] Patient Name: Dominic Kane Procedure Date : 03/04/2022 MRN: [id number] Attending MD: Carlota Raspberry. Havery Moros , MD, [id number] Date of Birth: Mar 31, 1940 CSN: [id number] Age: 81 Admit Type: Inpatient Procedure:                Colonoscopy Indications:              Iron deficiency anemia, dark stools - EGD without                            clear etiology, colonoscopy yesterday was poor prep                            with dark stool. Double prep yesterday, patient                            reports he started having bright red blood per                            rectum this AM. Providers:                Carlota Raspberry. Havery Moros, MD, Orvil Feil, RN, Brien Mates, Technician Referring MD:              Medicines:                Monitored Anesthesia Care Complications:            No immediate complications. Estimated blood loss:                            diverticular bleeding - hard to quantify. Estimated Blood Loss:     Estimated blood loss - hard to quantify Procedure:                Pre-Anesthesia Assessment:                           - Prior to the procedure, a History and Physical                            was performed, and patient medications and                            allergies were reviewed. The patient's tolerance of                            previous anesthesia was also reviewed. The risks                            and benefits of the procedure and the sedation                            options and risks were discussed with the patient.  All questions were answered, and informed consent                            was obtained. Prior Anticoagulants: The patient has                            taken no anticoagulant or antiplatelet agents. ASA                            Grade Assessment: III - A patient with severe                            systemic disease. After reviewing the risks and                             benefits, the patient was deemed in satisfactory                            condition to undergo the procedure.                           After obtaining informed consent, the colonoscope                            was passed under direct vision. Throughout the                            procedure, the patient's blood pressure, pulse, and                            oxygen saturations were monitored continuously. The                            [id number]-HQ190L ([id number]) Olympus peds colonscope was                            introduced through the anus and advanced to the the                            terminal ileum, with identification of the                            appendiceal orifice and IC valve. The colonoscopy                            was technically difficult and complex due to                            excessive bleeding. The patient tolerated the                            procedure well. The quality of the bowel  preparation was fair. The terminal ileum, ileocecal                            valve, appendiceal orifice, and rectum were                            photographed. Scope In: 8:34:15 AM Scope Out: 9:03:54 AM Scope Withdrawal Time: 0 hours 13 minutes 18 seconds  Total Procedure Duration: 0 hours 29 minutes 39 seconds  Findings:      The perianal and digital rectal examinations were normal.      The terminal ileum appeared normal. No blood      A few sessile polyps were found in the ascending colon and cecum. The       polyps were small in size. These were not removed today      Many large-mouthed diverticula were found in the entire colon.      Red blood with clots was found in the rectum, in the recto-sigmoid       colon, in the sigmoid colon, in the descending colon and in the distal       transverse colon. This made cecal intubation challenging. Significant       clot burden I could not clear, continued to clog the  colonoscope. Severe       diverticulosis in the left colon, most of blood was in the sigmoid       colon. I suspect diverticular bleeding in this area but could not       localize it due to clot burden which could not be cleared.      Internal hemorrhoids were found during retroflexion.      The exam was otherwise without abnormality. There was no blood in the       right colon or cecum. Impression:               - Preparation of the colon was fair due to blood /                            clots.                           - The examined portion of the ileum was normal.                           - Few small polyps in the ascending colon and in                            the cecum.                           - Diverticulosis in the entire examined colon with                            blood / clots in the rectum, in the recto-sigmoid                            colon, in the sigmoid colon, in the descending  colon and in the transverse colon. Could not clear                            clot burden / blood.                           - Internal hemorrhoids.                           - The examination was otherwise normal.                           Patient is having a suspected left sided                            diverticular bleed that clinically appeared to                            start this morning as he was finishing his prep.                            Previously had dark stools and iron deficiency. Due                            to clot burden / blood could not localize source of                            bleeding on this exam to stop it endoscopically. He                            had some hypotension with anesthesia managed                            medically. Will give 1 unit PRBC now and additional                            as needed. He needs a CTA to help localize and if                            positive will need IR embolization. This is a                             suprising finding, this would not account for prior                            iron deficiency however Recommendation:           - Return patient to hospital ward for ongoing care.                           - NPO.                           - Continue present medications.                           -  1 units PRBC transfusion now, additional units                            pending his course                           - CTA to assess, localize / confirm active                            bleeding, IR consult for embolization if positive                           - still may need capsule endoscopy to evaluate iron                            deficiency pending his course                           - will relay findings to the patient, family,                            primary team - if having persistent bleeding will                            need transfer to ICU Procedure Code(s):        --- Professional ---                           [phone], Colonoscopy, flexible; diagnostic, including                            collection of specimen(s) by brushing or washing,                            when performed (separate procedure) Diagnosis Code(s):        --- Professional ---                           K64.8, Other hemorrhoids                           D12.2, Benign neoplasm of ascending colon                           D12.0, Benign neoplasm of cecum                           K62.5, Hemorrhage of anus and rectum                           K92.2, Gastrointestinal hemorrhage, unspecified                           K92.1, Melena (includes Hematochezia)  D50.9, Iron deficiency anemia, unspecified                           K57.30, Diverticulosis of large intestine without                            perforation or abscess without bleeding CPT copyright 2022 [employer]. All rights reserved. The codes documented in this report are preliminary and  upon coder review may  be revised to meet current compliance requirements. Remo Lipps P. Maia Handa, MD 03/04/2022 9:20:20 AM This report has been signed electronically. Number of Addenda: 0

## 2022-03-04 NOTE — Progress Notes (Signed)
Back from PACU by bed awake and alert. Denied any discomfort.

## 2022-03-04 NOTE — Transfer of Care (Signed)
Immediate Anesthesia Transfer of Care Note  Patient: Dominic Kane  Procedure(s) Performed: COLONOSCOPY WITH PROPOFOL  Patient Location: PACU  Anesthesia Type:MAC  Level of Consciousness: awake, alert , oriented, and patient cooperative  Airway & Oxygen Therapy: Patient Spontanous Breathing and Patient connected to nasal cannula oxygen  Post-op Assessment: Report given to RN and Post -op Vital signs reviewed and stable  Post vital signs: Reviewed and stable  Last Vitals:  Vitals Value Taken Time  BP 110/63 03/04/22 0917  Temp    Pulse 69 03/04/22 0920  Resp 18 03/04/22 0920  SpO2 92 % 03/04/22 0920  Vitals shown include unvalidated device data.  Last Pain:  Vitals:   03/04/22 0725  TempSrc: Temporal  PainSc: 0-No pain         Complications: No notable events documented.

## 2022-03-05 ENCOUNTER — Encounter (HOSPITAL_COMMUNITY)
Admission: EM | Disposition: A | Discharge status: Home / Self Care | Payer: Self-pay | Source: Home / Self Care | Attending: Internal Medicine

## 2022-03-05 DIAGNOSIS — K922 Gastrointestinal hemorrhage, unspecified: Secondary | ICD-10-CM | POA: Diagnosis not present

## 2022-03-05 DIAGNOSIS — I5022 Chronic systolic (congestive) heart failure: Secondary | ICD-10-CM | POA: Diagnosis not present

## 2022-03-05 DIAGNOSIS — R195 Other fecal abnormalities: Secondary | ICD-10-CM

## 2022-03-05 DIAGNOSIS — D500 Iron deficiency anemia secondary to blood loss (chronic): Secondary | ICD-10-CM

## 2022-03-05 DIAGNOSIS — D62 Acute posthemorrhagic anemia: Secondary | ICD-10-CM | POA: Diagnosis not present

## 2022-03-05 HISTORY — PX: GIVENS CAPSULE STUDY: SHX5432

## 2022-03-05 LAB — BPAM RBC
Blood Product Expiration Date: 202401092359
ISSUE DATE / TIME: 202312121046
Unit Type and Rh: 5100

## 2022-03-05 LAB — CBC
HCT: 22.8 % — ABNORMAL LOW (ref 39.0–52.0)
Hemoglobin: 7.4 g/dL — ABNORMAL LOW (ref 13.0–17.0)
MCH: 30.3 pg (ref 26.0–34.0)
MCHC: 32.5 g/dL (ref 30.0–36.0)
MCV: 93.4 fL (ref 80.0–100.0)
Platelets: 142 10*3/uL — ABNORMAL LOW (ref 150–400)
RBC: 2.44 MIL/uL — ABNORMAL LOW (ref 4.22–5.81)
RDW: 13.8 % (ref 11.5–15.5)
WBC: 7.1 10*3/uL (ref 4.0–10.5)
nRBC: 0 % (ref 0.0–0.2)

## 2022-03-05 LAB — BASIC METABOLIC PANEL
Anion gap: 7 (ref 5–15)
BUN: 11 mg/dL (ref 8–23)
CO2: 23 mmol/L (ref 22–32)
Calcium: 8.3 mg/dL — ABNORMAL LOW (ref 8.9–10.3)
Chloride: 110 mmol/L (ref 98–111)
Creatinine, Ser: 0.83 mg/dL (ref 0.61–1.24)
GFR, Estimated: >60 mL/min (ref >60)
Glucose, Bld: 99 mg/dL (ref 70–99)
Potassium: 3.7 mmol/L (ref 3.5–5.1)
Sodium: 140 mmol/L (ref 135–145)

## 2022-03-05 LAB — GLUCOSE, CAPILLARY
Glucose-Capillary: 89 mg/dL (ref 70–99)
Glucose-Capillary: 95 mg/dL (ref 70–99)
Glucose-Capillary: 109 mg/dL — ABNORMAL HIGH (ref 70–99)
Glucose-Capillary: 97 mg/dL (ref 70–99)

## 2022-03-05 LAB — PREPARE RBC (CROSSMATCH)

## 2022-03-05 LAB — TYPE AND SCREEN
ABO/RH(D): O POS
Antibody Screen: NEGATIVE
Unit division: 0

## 2022-03-05 LAB — HEMOGLOBIN AND HEMATOCRIT, BLOOD
HCT: 28.8 % — ABNORMAL LOW (ref 39.0–52.0)
Hemoglobin: 9.8 g/dL — ABNORMAL LOW (ref 13.0–17.0)

## 2022-03-05 SURGERY — IMAGING PROCEDURE, GI TRACT, INTRALUMINAL, VIA CAPSULE
Anesthesia: LOCAL

## 2022-03-05 MED ORDER — SODIUM CHLORIDE 0.9% IV SOLUTION
Freq: Once | INTRAVENOUS | Status: AC
  Administered 2022-03-05: 10 mL via INTRAVENOUS

## 2022-03-05 MED ORDER — FUROSEMIDE 20 MG PO TABS: 20.0000 mg | ORAL_TABLET | Freq: Every day | ORAL | Status: DC

## 2022-03-05 SURGICAL SUPPLY — 1 items: TOWEL COTTON PACK 4EA (MISCELLANEOUS) ×4 IMPLANT

## 2022-03-05 NOTE — H&P (View-Only) (Signed)
Progress Note   Subjective  Patient states he had a decent night.  He has not had any further significant bleeding since yesterday.  His hemoglobin has down trended posttransfusion, now to 7.4.  He denies any pain.  Has been on clear liquids.   Objective   Vital signs in last 24 hours: Temp:  [97.5 F (36.4 C)-98.5 F (36.9 C)] 97.8 F (36.6 C) (12/13 0726) Pulse Rate:  [60-77] 64 (12/13 0726) Resp:  [15-21] 20 (12/13 0726) BP: (104-124)/(62-82) 119/69 (12/13 0726) SpO2:  [90 %-100 %] 99 % (12/12 1600) Weight:  [92.3 kg] 92.3 kg (12/13 0637) Last BM Date : 03/04/22 General:    white male in NAD Abdomen:  Soft, nontender and nondistended.  Neurologic:  Alert and oriented,  grossly normal neurologically. Psych:  Cooperative. Normal mood and affect.  Intake/Output from previous day: 12/12 0701 - 12/13 0700 In: 998 [I.V.:403; Blood:345; IV Piggyback:250] Out: 0  Intake/Output this shift: Total I/O In: 360 [P.O.:360] Out: -   Lab Results: Recent Labs    03/03/22 0028 03/03/22 2143 03/04/22 0019 03/04/22 1411 03/04/22 1641 03/05/22 0023  WBC 9.7  --  7.4  --   --  7.1  HGB 8.9*   < > 8.1* 7.8* 8.4* 7.4*  HCT 25.9*   < > 23.8* 23.8* 26.0* 22.8*  PLT 174  --  159  --   --  142*   < > = values in this interval not displayed.   BMET Recent Labs    03/03/22 0028 03/04/22 0019 03/05/22 0023  NA 138 143 140  K 3.8 3.3* 3.7  CL 108 111 110  CO2 20* 22 23  GLUCOSE 108* 114* 99  BUN 26* 17 11  CREATININE 0.91 0.85 0.83  CALCIUM 8.6* 8.5* 8.3*   LFT No results for input(s): "PROT", "ALBUMIN", "AST", "ALT", "ALKPHOS", "BILITOT", "BILIDIR", "IBILI" in the last 72 hours. PT/INR No results for input(s): "LABPROT", "INR" in the last 72 hours.  Studies/Results: CT ANGIO GI BLEED  Result Date: 03/04/2022 CLINICAL DATA:  Lower gastrointestinal bleeding. EXAM: CTA ABDOMEN AND PELVIS WITHOUT AND WITH CONTRAST TECHNIQUE: Multidetector CT imaging of the abdomen and  pelvis was performed using the standard protocol during bolus administration of intravenous contrast. Multiplanar reconstructed images and MIPs were obtained and reviewed to evaluate the vascular anatomy. RADIATION DOSE REDUCTION: This exam was performed according to the departmental dose-optimization program which includes automated exposure control, adjustment of the mA and/or kV according to patient size and/or use of iterative reconstruction technique. CONTRAST:  154m OMNIPAQUE IOHEXOL 350 MG/ML SOLN COMPARISON:  January 23, 2013. FINDINGS: VASCULAR Aorta: Atherosclerosis of abdominal aorta is noted without aneurysm or dissection. Celiac: Patent without evidence of aneurysm, dissection, vasculitis or significant stenosis. SMA: Patent without evidence of aneurysm, dissection, vasculitis or significant stenosis. Renals: Both renal arteries are patent without evidence of aneurysm, dissection, vasculitis, fibromuscular dysplasia or significant stenosis. IMA: Patent without evidence of aneurysm, dissection, vasculitis or significant stenosis. Inflow: Patent without evidence of aneurysm, dissection, vasculitis or significant stenosis. Proximal Outflow: Bilateral common femoral and visualized portions of the superficial and profunda femoral arteries are patent without evidence of aneurysm, dissection, vasculitis or significant stenosis. Veins: No obvious venous abnormality within the limitations of this arterial phase study. Review of the MIP images confirms the above findings. NON-VASCULAR Lower chest: No acute abnormality. Hepatobiliary: No focal liver abnormality is seen. No gallstones, gallbladder wall thickening, or biliary dilatation. Pancreas: Unremarkable. No pancreatic ductal dilatation  or surrounding inflammatory changes. Spleen: Normal in size without focal abnormality. Adrenals/Urinary Tract: Adrenal glands appear normal. Bilateral renal cysts are noted for which no further follow-up is required. No  hydronephrosis or renal obstruction is noted. Urinary bladder is unremarkable. Stomach/Bowel: Stomach is unremarkable. Diverticulosis of descending and sigmoid colon is noted without inflammation. No definite evidence of active gastrointestinal bleeding is noted. The appendix is unremarkable. Lymphatic: No adenopathy is noted. Reproductive: Prostate is unremarkable. Other: No abdominal wall hernia or abnormality. No abdominopelvic ascites. Musculoskeletal: No acute or significant osseous findings. IMPRESSION: VASCULAR Atherosclerosis of abdominal aorta is noted without aneurysm or dissection. No significant mesenteric or renal artery stenosis is noted. No definite evidence of active gastrointestinal bleeding. NON-VASCULAR Diverticulosis of descending and sigmoid colon is noted without inflammation. Electronically Signed   By: Marijo Conception M.D.   On: 03/04/2022 13:10       Assessment / Plan:    81 year old male who was admitted with dark heme positive stool and anemia in the setting of baby aspirin use.   EGD on 12/10 showed some gastric erosions but was not convinced this was causing his entire iron deficiency anemia, or dark stools.  Initial colonoscopy attempt on 12/11 aborted due to poor prep, dark brown stool noted.  He did additional prep and we performed a colonoscopy yesterday on 12/12.  At the end of his colonoscopy prep he developed bright red blood per rectum.  Colonoscopy yesterday showed significant red blood and clots in the left colon and distal transverse colon without any blood in the mid transverse or right colon otherwise.  Ileum was intubated and no blood there.  I did not see any cause for chronic iron deficiency in the colon that I could see but the left colon was poorly visualized.  He had had no red blood per rectum through his entire course up to that point.  Suspected he had a diverticular bleed. CTA was performed post procedure, he stopped bleeding at the time of that exam.  No  other concerning process in his abdomen.  His hemoglobin has drifted but he has not had any bleeding with any significant since yesterday.  Discussed the situation with him and his wife.  Unusual case, I think he developed an acute left-sided diverticular bleed yesterday which was new for him, on top of chronic iron deficiency and dark stools otherwise.  His diverticular bleed has seemingly resolved, however this would not cause his chronic IDA.  We discussed options.  I would like to clear his small intestine with capsule endoscopy, he is only been on a clear liquid diet and I do not think he needs to drink more prep for that.  I discussed risk benefits of capsule endoscopy and he wants to proceed with that today, hopefully we have results on that tomorrow.  Will can continue clear liquid diet today and advance later on if he remains stable from a bleeding perspective.  Given his hemoglobin has drifted, likely equilibration, would give him another unit of blood as he is in the low sevens at this point without much reserve should he have rebleeding.  Lengthy discussion with patient and wife, they understand the situation and want to proceed.  He will likely be in the hospital a few more days while we sort this out.  PLAN: - 1 unit PRBC transfusion today, trend Hgb - monitor for recurrent overt bleeding - capsule endoscopy later this morning, hope to have result back tomorrow - clear liquid diet  for now - will be NPO until capsule is placed later this morning. Consider advancing diet later today if he does well  Call with questions, will follow.  Jolly Mango, MD [facility]

## 2022-03-05 NOTE — Progress Notes (Signed)
[facility]                                                                              Dominic Kane, is a 81 y.o. male, DOB - Feb 17, 1941, [id number] Admit date - 03/01/2022    Outpatient Primary MD for the patient is Jinny Sanders, MD  LOS - 3  days  Chief Complaint  Patient presents with   Chest Pain       Brief summary    Dominic Kane is a 81 y.o. 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 Patient also reported that he had red blood per rectum initially, but over the last week stools turned to black.  Patient was admitted for further workup.  GI consulted.  Assessment & Plan    Principal Problem: Acute blood loss anemia secondary to GI bleed -FOBT, received 3 units of packed RBCs -GI was consulted, EGD showed no signs of recent bleeding, possible chronic GI blood loss secondary to gastric erosions -Colonoscopy on 12/12 showed significant blood clot burden in the entire large colon. -CT angio showed no definitive evidence of active GI bleeding -Hemoglobin down to 7.4 again, ordered transfusion, plan for capsule endoscopy today per GI -Continue PPI  Active problems Hypokalemia -Currently stable, 3.7   Transient hypotension with underlying history of systolic congestive heart failure, essential hypertension - BP regimen including Lasix, terazosin, ramipril held -Will resume Lasix 20 mg daily in a.m.  Chronic systolic CHF -2D echo in 1245 last EF noted to be 30 to 35% with grade 1 DD -Will resume Lasix 20 mg daily in a.m. (outpatient dose 40 mg daily) -Euvolemic   Controlled diabetes mellitus type 2, without long-term use of insulin Last available hemoglobin A1c 6.8 on 10/22/2021 -Continue sliding scale  insulin while inpatient Recent Labs    03/04/22 0918 03/04/22 1132 03/04/22 1642 03/04/22 2135 03/05/22 0634 03/05/22 1120  GLUCAP 106* 112* 117* 110* 89 95      Hypothyroidism History of papillary thyroid cancer Status post total thyroidectomy for papillary thyroid cancer back in 2018 by Dr. Harlow Asa - Last TSH 4.48 in 07/22/2021. -Continue Synthroid   Hyperlipidemia -Continue Crestor   BPH -Continue finasteride    Code Status: Full code DVT Prophylaxis:  SCDs Start: 03/01/22 1505   Level of Care: Level of care: Med-Surg Family Communication: Updated patient's wife at the bedside   Disposition Plan:      Remains inpatient appropriate: Capsule endoscopy today   Procedures:  EGD,  Consultants:   GI  Antimicrobials: None    Medications  sodium chloride   Intravenous Once   finasteride  5 mg Oral Daily   insulin aspart  0-15 Units Subcutaneous TID WC   levothyroxine  150 mcg Oral Q0600   melatonin  5 mg Oral QHS   pantoprazole  40 mg Oral Daily   rosuvastatin  10 mg Oral Daily   sodium chloride flush  3 mL  Intravenous Q12H      Subjective:   Dominic Kane was seen and examined today.  No acute complaints, states no significant bleeding however hemoglobin trending down.  Feeling little lightheaded, no dizziness, chest pain or shortness of breath.  Wife at the bedside.  Objective:   Vitals:   03/05/22 0637 03/05/22 0726 03/05/22 1119 03/05/22 1134  BP:  119/69 122/81 124/70  Pulse:  64 69 (!) 58  Resp:  20 (!) 21 19  Temp:  97.8 F (36.6 C) 98.2 F (36.8 C) 98.1 F (36.7 C)  TempSrc:  Oral Oral Oral  SpO2:   92%   Weight: 92.3 kg     Height:        Intake/Output Summary (Last 24 hours) at 03/05/2022 1232 Last data filed at 03/05/2022 1134 Gross per 24 hour  Intake 705 ml  Output 0 ml  Net 705 ml     Wt Readings from Last 3 Encounters:  03/05/22 92.3 kg  10/29/21 93.5 kg  07/22/21 94.4 kg     Exam General: Alert and oriented x 3,  NAD Cardiovascular: S1 S2 auscultated,  RRR Respiratory: Clear to auscultation bilaterally, no wheezing Gastrointestinal: Soft, nontender, nondistended, + bowel sounds Ext: no pedal edema bilaterally Neuro: no new FND's Psych: Normal affect and demeanor, alert and oriented x3     Data Reviewed:  I have personally reviewed following labs    CBC Lab Results  Component Value Date   WBC 7.1 03/05/2022   RBC 2.44 (L) 03/05/2022   HGB 7.4 (L) 03/05/2022   HCT 22.8 (L) 03/05/2022   MCV 93.4 03/05/2022   MCH 30.3 03/05/2022   PLT 142 (L) 03/05/2022   MCHC 32.5 03/05/2022   RDW 13.8 03/05/2022   LYMPHSABS 1.9 05/01/2011   MONOABS 0.7 05/01/2011   EOSABS 0.3 05/01/2011   BASOSABS 0.1 61/60/7371     Last metabolic panel Lab Results  Component Value Date   NA 140 03/05/2022   K 3.7 03/05/2022   CL 110 03/05/2022   CO2 23 03/05/2022   BUN 11 03/05/2022   CREATININE 0.83 03/05/2022   GLUCOSE 99 03/05/2022   GFRNONAA >60 03/05/2022   GFRAA 81 07/20/2019   CALCIUM 8.3 (L) 03/05/2022   PROT 6.2 (L) 03/01/2022   ALBUMIN 3.7 03/01/2022   BILITOT 0.8 03/01/2022   ALKPHOS 68 03/01/2022   AST 21 03/01/2022   ALT 15 03/01/2022   ANIONGAP 7 03/05/2022    CBG (last 3)  Recent Labs    03/04/22 2135 03/05/22 0634 03/05/22 1120  GLUCAP 110* 89 95      Coagulation Profile: No results for input(s): "INR", "PROTIME" in the last 168 hours.   Radiology Studies: I have personally reviewed the imaging studies  CT ANGIO GI BLEED  Result Date: 03/04/2022 CLINICAL DATA:  Lower gastrointestinal bleeding. EXAM: CTA ABDOMEN AND PELVIS WITHOUT AND WITH CONTRAST TECHNIQUE: Multidetector CT imaging of the abdomen and pelvis was performed using the standard protocol during bolus administration of intravenous contrast. Multiplanar reconstructed images and MIPs were obtained and reviewed to evaluate the vascular anatomy. RADIATION DOSE REDUCTION: This exam was performed according to the  departmental dose-optimization program which includes automated exposure control, adjustment of the mA and/or kV according to patient size and/or use of iterative reconstruction technique. CONTRAST:  127m OMNIPAQUE IOHEXOL 350 MG/ML SOLN COMPARISON:  January 23, 2013. FINDINGS: VASCULAR Aorta: Atherosclerosis of abdominal aorta is noted without aneurysm or dissection. Celiac: Patent without evidence of aneurysm, dissection, vasculitis or  significant stenosis. SMA: Patent without evidence of aneurysm, dissection, vasculitis or significant stenosis. Renals: Both renal arteries are patent without evidence of aneurysm, dissection, vasculitis, fibromuscular dysplasia or significant stenosis. IMA: Patent without evidence of aneurysm, dissection, vasculitis or significant stenosis. Inflow: Patent without evidence of aneurysm, dissection, vasculitis or significant stenosis. Proximal Outflow: Bilateral common femoral and visualized portions of the superficial and profunda femoral arteries are patent without evidence of aneurysm, dissection, vasculitis or significant stenosis. Veins: No obvious venous abnormality within the limitations of this arterial phase study. Review of the MIP images confirms the above findings. NON-VASCULAR Lower chest: No acute abnormality. Hepatobiliary: No focal liver abnormality is seen. No gallstones, gallbladder wall thickening, or biliary dilatation. Pancreas: Unremarkable. No pancreatic ductal dilatation or surrounding inflammatory changes. Spleen: Normal in size without focal abnormality. Adrenals/Urinary Tract: Adrenal glands appear normal. Bilateral renal cysts are noted for which no further follow-up is required. No hydronephrosis or renal obstruction is noted. Urinary bladder is unremarkable. Stomach/Bowel: Stomach is unremarkable. Diverticulosis of descending and sigmoid colon is noted without inflammation. No definite evidence of active gastrointestinal bleeding is noted. The appendix  is unremarkable. Lymphatic: No adenopathy is noted. Reproductive: Prostate is unremarkable. Other: No abdominal wall hernia or abnormality. No abdominopelvic ascites. Musculoskeletal: No acute or significant osseous findings. IMPRESSION: VASCULAR Atherosclerosis of abdominal aorta is noted without aneurysm or dissection. No significant mesenteric or renal artery stenosis is noted. No definite evidence of active gastrointestinal bleeding. NON-VASCULAR Diverticulosis of descending and sigmoid colon is noted without inflammation. Electronically Signed   By: Marijo Conception M.D.   On: 03/04/2022 13:10       Dominic Kane M.D. [facility] 03/05/2022, 12:32 PM  Available via Epic secure chat 7am-7pm After 7 pm, please refer to night coverage provider listed on amion.

## 2022-03-05 NOTE — Care Management Important Message (Signed)
Important Message  Patient Details  Name: Dominic Kane MRN: [id number] Date of Birth: [birth year]   Medicare Important Message Given:  Yes     Ed Rayson Montine Circle 03/05/2022, 1:39 PM

## 2022-03-05 NOTE — Progress Notes (Signed)
Pt swallowed pill cam with no difficulties. Instruction given to both the patient and RN. Both verbalize understanding.

## 2022-03-05 NOTE — Progress Notes (Signed)
Mobility Specialist Progress Note   03/05/22 1040  Mobility  Activity Contraindicated/medical hold   Patient wife flagged me down and stated he was SOB. He stated " the room feels stuffy and hard to catch my breath". Oxygen saturation read 65% and RN immediately notified. Was >90% when re-checked. Requested he receive blood before working with therapy of any kind. Will f/u later today.  Dominic Kane, BS EXP Mobility Specialist Please contact via SecureChat or Rehab office at [phone]

## 2022-03-05 NOTE — Plan of Care (Signed)
  Problem: Education: Goal: Knowledge of General Education information will improve Description Including pain rating scale, medication(s)/side effects and non-pharmacologic comfort measures Outcome: Progressing   

## 2022-03-05 NOTE — Progress Notes (Signed)
Progress Note   Subjective  Patient states he had a decent night.  He has not had any further significant bleeding since yesterday.  His hemoglobin has down trended posttransfusion, now to 7.4.  He denies any pain.  Has been on clear liquids.   Objective   Vital signs in last 24 hours: Temp:  [97.5 F (36.4 C)-98.5 F (36.9 C)] 97.8 F (36.6 C) (12/13 0726) Pulse Rate:  [60-77] 64 (12/13 0726) Resp:  [15-21] 20 (12/13 0726) BP: (104-124)/(62-82) 119/69 (12/13 0726) SpO2:  [90 %-100 %] 99 % (12/12 1600) Weight:  [92.3 kg] 92.3 kg (12/13 0637) Last BM Date : 03/04/22 General:    white male in NAD Abdomen:  Soft, nontender and nondistended.  Neurologic:  Alert and oriented,  grossly normal neurologically. Psych:  Cooperative. Normal mood and affect.  Intake/Output from previous day: 12/12 0701 - 12/13 0700 In: 998 [I.V.:403; Blood:345; IV Piggyback:250] Out: 0  Intake/Output this shift: Total I/O In: 360 [P.O.:360] Out: -   Lab Results: Recent Labs    03/03/22 0028 03/03/22 2143 03/04/22 0019 03/04/22 1411 03/04/22 1641 03/05/22 0023  WBC 9.7  --  7.4  --   --  7.1  HGB 8.9*   < > 8.1* 7.8* 8.4* 7.4*  HCT 25.9*   < > 23.8* 23.8* 26.0* 22.8*  PLT 174  --  159  --   --  142*   < > = values in this interval not displayed.   BMET Recent Labs    03/03/22 0028 03/04/22 0019 03/05/22 0023  NA 138 143 140  K 3.8 3.3* 3.7  CL 108 111 110  CO2 20* 22 23  GLUCOSE 108* 114* 99  BUN 26* 17 11  CREATININE 0.91 0.85 0.83  CALCIUM 8.6* 8.5* 8.3*   LFT No results for input(s): "PROT", "ALBUMIN", "AST", "ALT", "ALKPHOS", "BILITOT", "BILIDIR", "IBILI" in the last 72 hours. PT/INR No results for input(s): "LABPROT", "INR" in the last 72 hours.  Studies/Results: CT ANGIO GI BLEED  Result Date: 03/04/2022 CLINICAL DATA:  Lower gastrointestinal bleeding. EXAM: CTA ABDOMEN AND PELVIS WITHOUT AND WITH CONTRAST TECHNIQUE: Multidetector CT imaging of the abdomen and  pelvis was performed using the standard protocol during bolus administration of intravenous contrast. Multiplanar reconstructed images and MIPs were obtained and reviewed to evaluate the vascular anatomy. RADIATION DOSE REDUCTION: This exam was performed according to the departmental dose-optimization program which includes automated exposure control, adjustment of the mA and/or kV according to patient size and/or use of iterative reconstruction technique. CONTRAST:  179m OMNIPAQUE IOHEXOL 350 MG/ML SOLN COMPARISON:  January 23, 2013. FINDINGS: VASCULAR Aorta: Atherosclerosis of abdominal aorta is noted without aneurysm or dissection. Celiac: Patent without evidence of aneurysm, dissection, vasculitis or significant stenosis. SMA: Patent without evidence of aneurysm, dissection, vasculitis or significant stenosis. Renals: Both renal arteries are patent without evidence of aneurysm, dissection, vasculitis, fibromuscular dysplasia or significant stenosis. IMA: Patent without evidence of aneurysm, dissection, vasculitis or significant stenosis. Inflow: Patent without evidence of aneurysm, dissection, vasculitis or significant stenosis. Proximal Outflow: Bilateral common femoral and visualized portions of the superficial and profunda femoral arteries are patent without evidence of aneurysm, dissection, vasculitis or significant stenosis. Veins: No obvious venous abnormality within the limitations of this arterial phase study. Review of the MIP images confirms the above findings. NON-VASCULAR Lower chest: No acute abnormality. Hepatobiliary: No focal liver abnormality is seen. No gallstones, gallbladder wall thickening, or biliary dilatation. Pancreas: Unremarkable. No pancreatic ductal dilatation  or surrounding inflammatory changes. Spleen: Normal in size without focal abnormality. Adrenals/Urinary Tract: Adrenal glands appear normal. Bilateral renal cysts are noted for which no further follow-up is required. No  hydronephrosis or renal obstruction is noted. Urinary bladder is unremarkable. Stomach/Bowel: Stomach is unremarkable. Diverticulosis of descending and sigmoid colon is noted without inflammation. No definite evidence of active gastrointestinal bleeding is noted. The appendix is unremarkable. Lymphatic: No adenopathy is noted. Reproductive: Prostate is unremarkable. Other: No abdominal wall hernia or abnormality. No abdominopelvic ascites. Musculoskeletal: No acute or significant osseous findings. IMPRESSION: VASCULAR Atherosclerosis of abdominal aorta is noted without aneurysm or dissection. No significant mesenteric or renal artery stenosis is noted. No definite evidence of active gastrointestinal bleeding. NON-VASCULAR Diverticulosis of descending and sigmoid colon is noted without inflammation. Electronically Signed   By: Marijo Conception M.D.   On: 03/04/2022 13:10       Assessment / Plan:    81 year old male who was admitted with dark heme positive stool and anemia in the setting of baby aspirin use.   EGD on 12/10 showed some gastric erosions but was not convinced this was causing his entire iron deficiency anemia, or dark stools.  Initial colonoscopy attempt on 12/11 aborted due to poor prep, dark brown stool noted.  He did additional prep and we performed a colonoscopy yesterday on 12/12.  At the end of his colonoscopy prep he developed bright red blood per rectum.  Colonoscopy yesterday showed significant red blood and clots in the left colon and distal transverse colon without any blood in the mid transverse or right colon otherwise.  Ileum was intubated and no blood there.  I did not see any cause for chronic iron deficiency in the colon that I could see but the left colon was poorly visualized.  He had had no red blood per rectum through his entire course up to that point.  Suspected he had a diverticular bleed. CTA was performed post procedure, he stopped bleeding at the time of that exam.  No  other concerning process in his abdomen.  His hemoglobin has drifted but he has not had any bleeding with any significant since yesterday.  Discussed the situation with him and his wife.  Unusual case, I think he developed an acute left-sided diverticular bleed yesterday which was new for him, on top of chronic iron deficiency and dark stools otherwise.  His diverticular bleed has seemingly resolved, however this would not cause his chronic IDA.  We discussed options.  I would like to clear his small intestine with capsule endoscopy, he is only been on a clear liquid diet and I do not think he needs to drink more prep for that.  I discussed risk benefits of capsule endoscopy and he wants to proceed with that today, hopefully we have results on that tomorrow.  Will can continue clear liquid diet today and advance later on if he remains stable from a bleeding perspective.  Given his hemoglobin has drifted, likely equilibration, would give him another unit of blood as he is in the low sevens at this point without much reserve should he have rebleeding.  Lengthy discussion with patient and wife, they understand the situation and want to proceed.  He will likely be in the hospital a few more days while we sort this out.  PLAN: - 1 unit PRBC transfusion today, trend Hgb - monitor for recurrent overt bleeding - capsule endoscopy later this morning, hope to have result back tomorrow - clear liquid diet  for now - will be NPO until capsule is placed later this morning. Consider advancing diet later today if he does well  Call with questions, will follow.  Jolly Mango, MD [facility]

## 2022-03-05 NOTE — Evaluation (Signed)
Physical Therapy Evaluation & Discharge Patient Details Name: Dominic Kane MRN: [id number] DOB: [birth year] Today's Date: 03/05/2022  History of Present Illness  Pt is an 81 y.o. male admitted 03/01/22 with SOB, chest pressure, black stools. Workup for acute blood loss anemia secondary to GIB. PMH includes CHF, DM2, papillary thyroid cancer (s/p thyroidectomy in 2018).   Clinical Impression  Patient evaluated by Physical Therapy with no further acute PT needs identified. PTA, pt independent, works, lives with wife. Today, pt moving well without assist, supervision for safety since pt receiving blood transfusion; no overt instability, pt denies dizziness or fatigue. All education has been completed and the patient has no further questions. Acute PT is signing off. Thank you for this referral.    Supine BP 136/84 Sitting BP 129/78 Standing BP 142/80   Recommendations for follow up therapy are one component of a multi-disciplinary discharge planning process, led by the attending physician.  Recommendations may be updated based on patient status, additional functional criteria and insurance authorization.  Follow Up Recommendations No PT follow up      Assistance Recommended at Discharge PRN  Patient can return home with the following   N/A    Equipment Recommendations None recommended by PT  Recommendations for Other Services   Mobility Specialist   Functional Status Assessment Patient has not had a recent decline in their functional status     Precautions / Restrictions Precautions Precautions: Fall Restrictions Weight Bearing Restrictions: No      Mobility  Bed Mobility Overal bed mobility: Modified Independent                  Transfers Overall transfer level: Independent Equipment used: None                    Ambulation/Gait Ambulation/Gait assistance: Supervision Gait Distance (Feet): 500 Feet Assistive device: IV Pole, None Gait  Pattern/deviations: Step-through pattern, Decreased stride length Gait velocity: Decreased     General Gait Details: slow, steady gait with and without pushing IV pole; supervision for safety due to pt receiving blood, otherwise pt ambulating well without assist. pt notes some SOB when talking while ambulating  Stairs            Wheelchair Mobility    Modified Rankin (Stroke Patients Only)       Balance Overall balance assessment: Needs assistance Sitting-balance support: No upper extremity supported, Feet supported Sitting balance-Leahy Scale: Good       Standing balance-Leahy Scale: Good               High level balance activites: Side stepping, Turns, Sudden stops, Head turns High Level Balance Comments: no overt instability or LOB observed with higher level balance tasks             Pertinent Vitals/Pain Pain Assessment Pain Assessment: No/denies pain    Home Living Family/patient expects to be discharged to:: Private residence Living Arrangements: Spouse/significant other Available Help at Discharge: Family Type of Home: House Home Access: Level entry       Home Layout: Two level;Laundry or work area in basement;Able to live on main level with bedroom/bathroom Home Equipment: Grab bars - tub/shower (walking staff)      Prior Function Prior Level of Function : Independent/Modified Independent;Working/employed;Driving             Mobility Comments: independent household ambulator; uses staff outside walking on uneven ground. working ADLs Comments: independent, stands to Theatre stage manager  [place]        Extremity/Trunk Assessment   Upper Extremity Assessment Upper Extremity Assessment: Overall WFL for tasks assessed    Lower Extremity Assessment Lower Extremity Assessment: Overall WFL for tasks assessed       Communication   Communication: HOH  Cognition Arousal/Alertness: Awake/alert Behavior During Therapy: WFL for tasks  assessed/performed Overall Cognitive Status: Within Functional Limits for tasks assessed                                          General Comments General comments (skin integrity, edema, etc.): BP stable with mobility (supine 136/84, sitting BP 129/78, standing BP 142/80); pt endorses significant improvement in fatigue since getting blood transfusion this afternoon    Exercises     Assessment/Plan    PT Assessment Patient does not need any further PT services  PT Problem List         PT Treatment Interventions      PT Goals (Current goals can be found in the Care Plan section)  Acute Rehab PT Goals PT Goal Formulation: All assessment and education complete, DC therapy    Frequency       Co-evaluation               AM-PAC PT "6 Clicks" Mobility  Outcome Measure Help needed turning from your back to your side while in a flat bed without using bedrails?: None Help needed moving from lying on your back to sitting on the side of a flat bed without using bedrails?: None Help needed moving to and from a bed to a chair (including a wheelchair)?: None Help needed standing up from a chair using your arms (e.g., wheelchair or bedside chair)?: None Help needed to walk in hospital room?: A Little Help needed climbing 3-5 steps with a railing? : A Little 6 Click Score: 22    End of Session Equipment Utilized During Treatment: Gait belt Activity Tolerance: Patient tolerated treatment well Patient left: in bed;with call bell/phone within reach;with family/visitor present Nurse Communication: Mobility status PT Visit Diagnosis: Other abnormalities of gait and mobility (R26.89)    Time: 7322-0254 PT Time Calculation (min) (ACUTE ONLY): 20 min   Charges:   PT Evaluation $PT Eval Low Complexity: [place], PT, DPT Acute Rehabilitation Services  Personal: [facility] Office: [phone]  Derry Lory 03/05/2022, 2:12 PM

## 2022-03-05 NOTE — TOC Progression Note (Signed)
Transition of Care [facility]) - Progression Note    Patient Details  Name: Dominic Kane MRN: [id number] Date of Birth: 23-Aug-1940  [facility] of [facility] CM/SW Contact  Zenon Mayo, RN Phone Number: 03/05/2022, 4:16 PM  Clinical Narrative:    Acute blood loss secondary to GIB, GI consulted, EGD done, choloscopy showed significant blood clot in large colon.  Capsule endoscopy done today, awaiting results for tomorrow, conts on clears.  TOC following.         Expected Discharge Plan and Services                                                 Social Determinants of Health (SDOH) Interventions    Readmission Risk Interventions     No data to display

## 2022-03-06 ENCOUNTER — Encounter (HOSPITAL_COMMUNITY): Payer: Self-pay | Admitting: Gastroenterology

## 2022-03-06 DIAGNOSIS — K5730 Diverticulosis of large intestine without perforation or abscess without bleeding: Secondary | ICD-10-CM

## 2022-03-06 DIAGNOSIS — D509 Iron deficiency anemia, unspecified: Secondary | ICD-10-CM

## 2022-03-06 LAB — CBC
HCT: 25.4 % — ABNORMAL LOW (ref 39.0–52.0)
Hemoglobin: 8.8 g/dL — ABNORMAL LOW (ref 13.0–17.0)
MCH: 31 pg (ref 26.0–34.0)
MCHC: 34.6 g/dL (ref 30.0–36.0)
MCV: 89.4 fL (ref 80.0–100.0)
Platelets: 139 10*3/uL — ABNORMAL LOW (ref 150–400)
RBC: 2.84 MIL/uL — ABNORMAL LOW (ref 4.22–5.81)
RDW: 14.7 % (ref 11.5–15.5)
WBC: 7.9 10*3/uL (ref 4.0–10.5)
nRBC: 0 % (ref 0.0–0.2)

## 2022-03-06 LAB — TYPE AND SCREEN
ABO/RH(D): O POS
Antibody Screen: NEGATIVE
Unit division: 0
Unit division: 0

## 2022-03-06 LAB — GLUCOSE, CAPILLARY
Glucose-Capillary: 85 mg/dL (ref 70–99)
Glucose-Capillary: 119 mg/dL — ABNORMAL HIGH (ref 70–99)
Glucose-Capillary: 119 mg/dL — ABNORMAL HIGH (ref 70–99)
Glucose-Capillary: 124 mg/dL — ABNORMAL HIGH (ref 70–99)
Glucose-Capillary: 139 mg/dL — ABNORMAL HIGH (ref 70–99)

## 2022-03-06 LAB — BPAM RBC
Blood Product Expiration Date: 202312202359
Blood Product Expiration Date: 202401102359
ISSUE DATE / TIME: 202312131115
ISSUE DATE / TIME: 202312131428
Unit Type and Rh: 5100
Unit Type and Rh: 5100

## 2022-03-06 MED ORDER — FUROSEMIDE 20 MG PO TABS
20.0000 mg | ORAL_TABLET | Freq: Every day | ORAL | Status: DC
  Administered 2022-03-06: 20 mg via ORAL
  Filled 2022-03-06: qty 1

## 2022-03-06 MED ORDER — FLEET ENEMA 7-19 GM/118ML RE ENEM
1.0000 | ENEMA | Freq: Once | RECTAL | Status: AC
  Administered 2022-03-07: 1 via RECTAL
  Filled 2022-03-06 (×2): qty 1

## 2022-03-06 MED ORDER — BISACODYL 10 MG RE SUPP
10.0000 mg | Freq: Once | RECTAL | Status: AC
  Administered 2022-03-07: 10 mg via RECTAL
  Filled 2022-03-06: qty 1

## 2022-03-06 NOTE — H&P (View-Only) (Signed)
Progress Note   Subjective  Patient doing well, no further bleeding, on soft diet. Hgb has drifted slightly following transfusion the other day. Capsule endoscopy done yesterday.   Objective   Vital signs in last 24 hours: Temp:  [97.6 F (36.4 C)-98.7 F (37.1 C)] 98.1 F (36.7 C) (12/14 1201) Pulse Rate:  [62-86] 71 (12/14 1201) Resp:  [16-18] 16 (12/14 0724) BP: (121-138)/(68-82) 123/82 (12/14 1201) SpO2:  [95 %-96 %] 96 % (12/14 1201) Weight:  [92.8 kg] 92.8 kg (12/14 0424) Last BM Date : 03/05/22 General:    white male in NAD Neurologic:  Alert and oriented,  grossly normal neurologically. Psych:  Cooperative. Normal mood and affect.  Intake/Output from previous day: 12/13 0701 - 12/14 0700 In: 1034 [P.O.:360; Blood:674] Out: 700 [Urine:700] Intake/Output this shift: Total I/O In: 240 [P.O.:240] Out: -   Lab Results: Recent Labs    03/04/22 0019 03/04/22 1411 03/05/22 0023 03/05/22 1917 03/06/22 0021  WBC 7.4  --  7.1  --  7.9  HGB 8.1*   < > 7.4* 9.8* 8.8*  HCT 23.8*   < > 22.8* 28.8* 25.4*  PLT 159  --  142*  --  139*   < > = values in this interval not displayed.   BMET Recent Labs    03/04/22 0019 03/05/22 0023  NA 143 140  K 3.3* 3.7  CL 111 110  CO2 22 23  GLUCOSE 114* 99  BUN 17 11  CREATININE 0.85 0.83  CALCIUM 8.5* 8.3*   LFT No results for input(s): "PROT", "ALBUMIN", "AST", "ALT", "ALKPHOS", "BILITOT", "BILIDIR", "IBILI" in the last 72 hours. PT/INR No results for input(s): "LABPROT", "INR" in the last 72 hours.  Studies/Results: No results found.     Assessment / Plan:    81 y/o male, we are consulted for the following:  Dark heme positive stool Iron deficiency anemia Diverticular bleed sigmoid colon  EGD 12/10 showed some gastric erosions but was not convinced this was causing his entire iron deficiency anemia, or dark stools.  Initial colonoscopy attempt on 12/11 aborted due to poor prep, dark brown stool noted.   Repeat colonoscopy 12/12 - at the end of his colonoscopy prep he developed bright red blood per rectum for the first time, he had never had that before.  Colonoscopy showed significant red blood and clots in the left colon and distal transverse colon without any blood in the mid transverse or right colon otherwise.  Ileum was intubated and no blood there. I did not see any cause for chronic iron deficiency in the colon that I could see, but the left colon was poorly visualized. Suspected he very likely had a diverticular bleed at that time. CTA was performed post procedure, he stopped bleeding at the time of that exam.  No other concerning process in his abdomen.  Capsule endoscopy done yesterday - diminutive red spot in distal small bowel but nothing concerning otherwise to cause his anemia.  Discussed with the patient. While I do think he had a diverticular bleed the other day during colonoscopy, can't say for sure without clearing his left colon, which would entail flex sig at least. It is unusual that he would have 2 separate processes for IDA / dark stools, and then acute lower GI bleed the other day, although possible. In this light, offered him a flex sig tomorrow AM to clear his left colon before he goes home. He wished to proceed, would feel more  comfortable looking prior to going home. Will make NPO after MN. Enemas / dulcolax prior to the exam. Further recommendations pending the results.   PLAN: - flex sig tomorrow AM - NPO after MN - monitor Hgb and for recurrent bleeding  Jolly Mango, MD [facility]

## 2022-03-06 NOTE — Progress Notes (Signed)
Progress Note   Subjective  Patient doing well, no further bleeding, on soft diet. Hgb has drifted slightly following transfusion the other day. Capsule endoscopy done yesterday.   Objective   Vital signs in last 24 hours: Temp:  [97.6 F (36.4 C)-98.7 F (37.1 C)] 98.1 F (36.7 C) (12/14 1201) Pulse Rate:  [62-86] 71 (12/14 1201) Resp:  [16-18] 16 (12/14 0724) BP: (121-138)/(68-82) 123/82 (12/14 1201) SpO2:  [95 %-96 %] 96 % (12/14 1201) Weight:  [92.8 kg] 92.8 kg (12/14 0424) Last BM Date : 03/05/22 General:    white male in NAD Neurologic:  Alert and oriented,  grossly normal neurologically. Psych:  Cooperative. Normal mood and affect.  Intake/Output from previous day: 12/13 0701 - 12/14 0700 In: 1034 [P.O.:360; Blood:674] Out: 700 [Urine:700] Intake/Output this shift: Total I/O In: 240 [P.O.:240] Out: -   Lab Results: Recent Labs    03/04/22 0019 03/04/22 1411 03/05/22 0023 03/05/22 1917 03/06/22 0021  WBC 7.4  --  7.1  --  7.9  HGB 8.1*   < > 7.4* 9.8* 8.8*  HCT 23.8*   < > 22.8* 28.8* 25.4*  PLT 159  --  142*  --  139*   < > = values in this interval not displayed.   BMET Recent Labs    03/04/22 0019 03/05/22 0023  NA 143 140  K 3.3* 3.7  CL 111 110  CO2 22 23  GLUCOSE 114* 99  BUN 17 11  CREATININE 0.85 0.83  CALCIUM 8.5* 8.3*   LFT No results for input(s): "PROT", "ALBUMIN", "AST", "ALT", "ALKPHOS", "BILITOT", "BILIDIR", "IBILI" in the last 72 hours. PT/INR No results for input(s): "LABPROT", "INR" in the last 72 hours.  Studies/Results: No results found.     Assessment / Plan:    81 y/o male, we are consulted for the following:  Dark heme positive stool Iron deficiency anemia Diverticular bleed sigmoid colon  EGD 12/10 showed some gastric erosions but was not convinced this was causing his entire iron deficiency anemia, or dark stools.  Initial colonoscopy attempt on 12/11 aborted due to poor prep, dark brown stool noted.   Repeat colonoscopy 12/12 - at the end of his colonoscopy prep he developed bright red blood per rectum for the first time, he had never had that before.  Colonoscopy showed significant red blood and clots in the left colon and distal transverse colon without any blood in the mid transverse or right colon otherwise.  Ileum was intubated and no blood there. I did not see any cause for chronic iron deficiency in the colon that I could see, but the left colon was poorly visualized. Suspected he very likely had a diverticular bleed at that time. CTA was performed post procedure, he stopped bleeding at the time of that exam.  No other concerning process in his abdomen.  Capsule endoscopy done yesterday - diminutive red spot in distal small bowel but nothing concerning otherwise to cause his anemia.  Discussed with the patient. While I do think he had a diverticular bleed the other day during colonoscopy, can't say for sure without clearing his left colon, which would entail flex sig at least. It is unusual that he would have 2 separate processes for IDA / dark stools, and then acute lower GI bleed the other day, although possible. In this light, offered him a flex sig tomorrow AM to clear his left colon before he goes home. He wished to proceed, would feel more  comfortable looking prior to going home. Will make NPO after MN. Enemas / dulcolax prior to the exam. Further recommendations pending the results.   PLAN: - flex sig tomorrow AM - NPO after MN - monitor Hgb and for recurrent bleeding  Jolly Mango, MD [facility]

## 2022-03-06 NOTE — Progress Notes (Signed)
Mobility Specialist Progress Note    03/06/22 0949  Mobility  Activity Ambulated independently in hallway  Level of Assistance Standby assist, set-up cues, supervision of patient - no hands on  Assistive Device None  Distance Ambulated (ft) 420 ft  Activity Response Tolerated well  Mobility Referral Yes  $Mobility charge 1 Mobility   Pre-Mobility: 70 HR During Mobility: 86 HR Post-Mobility: 79 HR  Pt received in bed and agreeable. C/o R ankle feeling a little numb. Returned to sitting EOB with call bell in reach.    Hildred Alamin Mobility Specialist  Please Psychologist, sport and exercise or Rehab Office at [phone]

## 2022-03-06 NOTE — Progress Notes (Signed)
[facility]                                                                              [name], is a 81 y.o. male, DOB - 1940/11/15, ZGY:[id number] Admit date - 03/01/2022    Outpatient Primary MD for the patient is Jinny Sanders, MD  LOS - 4  days  Chief Complaint  Patient presents with   Chest Pain       Brief summary    Dominic Kane is a 81 y.o. 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 Patient also reported that he had red blood per rectum initially, but over the last week stools turned to black.  Patient was admitted for further workup.  GI consulted.  Assessment & Plan    Principal Problem: Acute blood loss anemia secondary to GI bleed -FOBT, received 3 units of packed RBCs -GI was consulted, EGD showed no signs of recent bleeding, possible chronic GI blood loss secondary to gastric erosions -Colonoscopy on 12/12 showed significant blood clot burden in the entire large colon. -CT angio showed no definitive evidence of active GI bleeding -Continue PPI, underwent capsule endoscopy on 12/13, results still pending, await GI recommendations -H&H stable, 8.8  Active problems Hypokalemia -Stable   Transient hypotension with underlying history of systolic congestive heart failure, essential hypertension - BP regimen including Lasix, terazosin, ramipril held  Chronic systolic CHF -2D echo in 5916 last EF noted to be 30 to 35% with grade 1 DD -Resumed Lasix 20 mg daily.  (outpatient dose 40 mg daily) -I's and O's with 2 L positive  Controlled diabetes mellitus type 2, without long-term use of insulin Last available hemoglobin A1c 6.8 on 10/22/2021 -Continue sliding scale insulin while in patient     Hypothyroidism History of papillary thyroid cancer Status post total thyroidectomy for papillary thyroid cancer back in 2018 by Dr. Harlow Asa - Last TSH 4.48 in 07/22/2021. -Continue Synthroid   Hyperlipidemia -Continue Crestor   BPH -Continue finasteride    Code Status: Full code DVT Prophylaxis:  SCDs Start: 03/01/22 1505   Level of Care: Level of care: Med-Surg Family Communication: Updated patient's wife at the bedside today   Disposition Plan:      Remains inpatient appropriate: Pending result from capsule endoscopy and further management as per GI    Procedures:  EGD,  Consultants:   GI  Antimicrobials: None    Medications  finasteride  5 mg Oral Daily   insulin aspart  0-15 Units Subcutaneous TID WC   levothyroxine  150 mcg Oral Q0600   melatonin  5 mg Oral QHS   pantoprazole  40 mg Oral Daily   rosuvastatin  10 mg Oral Daily   sodium chloride flush  3 mL Intravenous Q12H      Subjective:   Nilay Kane was seen and examined today.  No acute complaints, ambulating in the hallway with his wife.  No chest  pain, shortness of breath, dizziness or lightheadedness.  No active bleeding.  Objective:   Vitals:   03/06/22 0424 03/06/22 0724 03/06/22 1200 03/06/22 1201  BP: 136/68 122/76 123/82 123/82  Pulse:  86 80 71  Resp: 18 16    Temp: 97.9 F (36.6 C) 97.6 F (36.4 C)  98.1 F (36.7 C)  TempSrc: Oral Oral  Oral  SpO2:  95% 96% 96%  Weight: 92.8 kg     Height:        Intake/Output Summary (Last 24 hours) at 03/06/2022 1306 Last data filed at 03/06/2022 1200 Gross per 24 hour  Intake 884 ml  Output 700 ml  Net 184 ml     Wt Readings from Last 3 Encounters:  03/06/22 92.8 kg  10/29/21 93.5 kg  07/22/21 94.4 kg   Physical Exam General: Alert and oriented x 3, NAD Cardiovascular: S1 S2 clear, RRR.  Respiratory: CTAB, no wheezing Gastrointestinal: Soft, nontender, nondistended, NBS Ext: no pedal edema bilaterally Neuro: no new  deficits psych: Normal affect    Data Reviewed:  I have personally reviewed following labs    CBC Lab Results  Component Value Date   WBC 7.9 03/06/2022   RBC 2.84 (L) 03/06/2022   HGB 8.8 (L) 03/06/2022   HCT 25.4 (L) 03/06/2022   MCV 89.4 03/06/2022   MCH 31.0 03/06/2022   PLT 139 (L) 03/06/2022   MCHC 34.6 03/06/2022   RDW 14.7 03/06/2022   LYMPHSABS 1.9 05/01/2011   MONOABS 0.7 05/01/2011   EOSABS 0.3 05/01/2011   BASOSABS 0.1 78/24/2353     Last metabolic panel Lab Results  Component Value Date   NA 140 03/05/2022   K 3.7 03/05/2022   CL 110 03/05/2022   CO2 23 03/05/2022   BUN 11 03/05/2022   CREATININE 0.83 03/05/2022   GLUCOSE 99 03/05/2022   GFRNONAA >60 03/05/2022   GFRAA 81 07/20/2019   CALCIUM 8.3 (L) 03/05/2022   PROT 6.2 (L) 03/01/2022   ALBUMIN 3.7 03/01/2022   BILITOT 0.8 03/01/2022   ALKPHOS 68 03/01/2022   AST 21 03/01/2022   ALT 15 03/01/2022   ANIONGAP 7 03/05/2022    CBG (last 3)  Recent Labs    03/05/22 2203 03/06/22 0601 03/06/22 1202  GLUCAP 97 85 119*  119*      Coagulation Profile: No results for input(s): "INR", "PROTIME" in the last 168 hours.   Radiology Studies: I have personally reviewed the imaging studies  No results found.     Estill Cotta M.D. [facility] 03/06/2022, 1:06 PM  Available via Epic secure chat 7am-7pm After 7 pm, please refer to night coverage provider listed on amion.

## 2022-03-07 ENCOUNTER — Encounter (HOSPITAL_COMMUNITY): Payer: Self-pay | Admitting: Internal Medicine

## 2022-03-07 ENCOUNTER — Encounter (HOSPITAL_COMMUNITY)
Admission: EM | Disposition: A | Discharge status: Home / Self Care | Payer: Self-pay | Source: Home / Self Care | Attending: Internal Medicine

## 2022-03-07 ENCOUNTER — Inpatient Hospital Stay (HOSPITAL_COMMUNITY): Payer: PPO | Admitting: Anesthesiology

## 2022-03-07 DIAGNOSIS — I110 Hypertensive heart disease with heart failure: Secondary | ICD-10-CM

## 2022-03-07 DIAGNOSIS — Z87891 Personal history of nicotine dependence: Secondary | ICD-10-CM

## 2022-03-07 DIAGNOSIS — D649 Anemia, unspecified: Secondary | ICD-10-CM

## 2022-03-07 DIAGNOSIS — I2510 Atherosclerotic heart disease of native coronary artery without angina pectoris: Secondary | ICD-10-CM

## 2022-03-07 DIAGNOSIS — E890 Postprocedural hypothyroidism: Secondary | ICD-10-CM | POA: Diagnosis not present

## 2022-03-07 DIAGNOSIS — K922 Gastrointestinal hemorrhage, unspecified: Secondary | ICD-10-CM | POA: Diagnosis not present

## 2022-03-07 DIAGNOSIS — I509 Heart failure, unspecified: Secondary | ICD-10-CM

## 2022-03-07 DIAGNOSIS — D62 Acute posthemorrhagic anemia: Secondary | ICD-10-CM | POA: Diagnosis not present

## 2022-03-07 HISTORY — PX: FLEXIBLE SIGMOIDOSCOPY: SHX5431

## 2022-03-07 LAB — BASIC METABOLIC PANEL
Anion gap: 8 (ref 5–15)
BUN: 8 mg/dL (ref 8–23)
CO2: 27 mmol/L (ref 22–32)
Calcium: 8.7 mg/dL — ABNORMAL LOW (ref 8.9–10.3)
Chloride: 107 mmol/L (ref 98–111)
Creatinine, Ser: 0.91 mg/dL (ref 0.61–1.24)
GFR, Estimated: >60 mL/min (ref >60)
Glucose, Bld: 100 mg/dL — ABNORMAL HIGH (ref 70–99)
Potassium: 4.3 mmol/L (ref 3.5–5.1)
Sodium: 142 mmol/L (ref 135–145)

## 2022-03-07 LAB — GLUCOSE, CAPILLARY
Glucose-Capillary: 95 mg/dL (ref 70–99)
Glucose-Capillary: 98 mg/dL (ref 70–99)
Glucose-Capillary: 104 mg/dL — ABNORMAL HIGH (ref 70–99)
Glucose-Capillary: 94 mg/dL (ref 70–99)

## 2022-03-07 LAB — HEMOGLOBIN AND HEMATOCRIT, BLOOD
HCT: 30.9 % — ABNORMAL LOW (ref 39.0–52.0)
HCT: 29.4 % — ABNORMAL LOW (ref 39.0–52.0)
HCT: 27.8 % — ABNORMAL LOW (ref 39.0–52.0)
Hemoglobin: 10.1 g/dL — ABNORMAL LOW (ref 13.0–17.0)
Hemoglobin: 9.7 g/dL — ABNORMAL LOW (ref 13.0–17.0)
Hemoglobin: 9.3 g/dL — ABNORMAL LOW (ref 13.0–17.0)

## 2022-03-07 SURGERY — SIGMOIDOSCOPY, FLEXIBLE
Anesthesia: Monitor Anesthesia Care

## 2022-03-07 MED ORDER — POLYETHYLENE GLYCOL 3350 17 GM/SCOOP PO POWD
0.5000 | Freq: Once | ORAL | Status: AC
  Administered 2022-03-07: 127.5 g via ORAL
  Filled 2022-03-07: qty 255

## 2022-03-07 MED ORDER — LACTATED RINGERS IV SOLN: INTRAVENOUS | Status: DC

## 2022-03-07 MED ORDER — PROPOFOL 10 MG/ML IV BOLUS
INTRAVENOUS | Status: DC | PRN
  Administered 2022-03-07 (×2): 50 mg via INTRAVENOUS

## 2022-03-07 MED ORDER — POLYETHYLENE GLYCOL 3350 17 GM/SCOOP PO POWD
1.0000 | Freq: Once | ORAL | Status: AC
  Administered 2022-03-08: 255 g via ORAL
  Filled 2022-03-07: qty 255

## 2022-03-07 MED ORDER — PROPOFOL 500 MG/50ML IV EMUL
INTRAVENOUS | Status: DC | PRN
  Administered 2022-03-07: 150 ug/kg/min via INTRAVENOUS

## 2022-03-07 NOTE — Progress Notes (Signed)
[facility]                                                                              Dominic Kane, is a 81 y.o. male, DOB - Oct 06, 1940, EPP:[id number] Admit date - 03/01/2022    Outpatient Primary MD for the patient is Jinny Sanders, MD  LOS - 5  days  Chief Complaint  Patient presents with   Chest Pain       Brief summary    Dominic Kane is a 81 y.o. 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 Patient also reported that he had red blood per rectum initially, but over the last week stools turned to black.  Patient was admitted for further workup.  GI consulted.  Assessment & Plan    Principal Problem: Acute blood loss anemia secondary to GI bleed -FOBT, received 3 units of packed RBCs -GI was consulted, EGD showed no signs of recent bleeding, possible chronic GI blood loss secondary to gastric erosions -Colonoscopy on 12/12 showed significant blood clot burden in the entire large colon. -CT angio showed no definitive evidence of active GI bleeding -Continue PPI, underwent capsule endoscopy on 12/13, showed red spot in distal small bowel -Patient underwent flex sig today, with diverticulosis, unfortunately showed clotted maroon blood with areas of red blood in the sigmoid colon.  Plan for clear liquid diet and possible another prep for full colonoscopy tomorrow -Hemoglobin is still stable 10.1, will continue to monitor closely   Active problems Hypokalemia -Stable   Transient hypotension with underlying history of systolic congestive heart failure, essential hypertension - BP regimen including Lasix, terazosin, ramipril held  Chronic systolic CHF -2D echo in 6063 last EF noted to be 30 to 35% with grade 1  DD -Resumed Lasix 20 mg daily.  (outpatient dose 40 mg daily)  Controlled diabetes mellitus type 2, without long-term use of insulin Last available hemoglobin A1c 6.8 on 10/22/2021 -Continue sliding scale while inpatient Recent Labs    03/06/22 0601 03/06/22 1202 03/06/22 1537 03/06/22 2131 03/07/22 0623 03/07/22 1124  GLUCAP 85 119*  119* 124* 139* 95 98   '   Hypothyroidism History of papillary thyroid cancer Status post total thyroidectomy for papillary thyroid cancer back in 2018 by Dr. Harlow Asa - Last TSH 4.48 in 07/22/2021. -Continue Synthroid   Hyperlipidemia -Continue Crestor   BPH -Continue finasteride    Code Status: Full code DVT Prophylaxis:  SCDs Start: 03/01/22 1505   Level of Care: Level of care: Med-Surg Family Communication: Discussed with patient's wife at the bedside   Disposition Plan:      Remains inpatient appropriate: Unfortunately noted to have bleeding during flex sig, plan for colonoscopy again in a.m.  Procedures:  EGD, colonoscopy, CT angiogram, capsule endoscopy  Consultants:   GI  Antimicrobials: None    Medications  finasteride  5 mg Oral Daily   furosemide  20 mg Oral Daily   insulin  aspart  0-15 Units Subcutaneous TID WC   levothyroxine  150 mcg Oral Q0600   melatonin  5 mg Oral QHS   pantoprazole  40 mg Oral Daily   polyethylene glycol powder  0.5 Container Oral Once   [START ON 03/08/2022] polyethylene glycol powder  1 Container Oral Once   rosuvastatin  10 mg Oral Daily   sodium chloride flush  3 mL Intravenous Q12H      Subjective:   Ebrahim Deremer was seen and examined today.  Patient reported no active bleeding overnight.   No dizziness, hematemesis.    Objective:   Vitals:   03/07/22 0934 03/07/22 1120 03/07/22 1130 03/07/22 1145  BP: (!) 143/89 111/70 103/74 133/83  Pulse: 96 72 70 71  Resp: 14 (!) 27 (!) 25 19  Temp:  97.7 F (36.5 C)  97.7 F (36.5 C)  TempSrc:    Oral  SpO2: 97% 94% 94% 95%   Weight:      Height:        Intake/Output Summary (Last 24 hours) at 03/07/2022 1406 Last data filed at 03/07/2022 1300 Gross per 24 hour  Intake 240 ml  Output 0 ml  Net 240 ml     Wt Readings from Last 3 Encounters:  03/07/22 91.9 kg  10/29/21 93.5 kg  07/22/21 94.4 kg    Physical Exam General: Alert and oriented x 3, NAD Cardiovascular: S1 S2 clear, RRR.  Respiratory: CTAB, no wheezing Gastrointestinal: Soft, nontender, nondistended, NBS Ext: no pedal edema bilaterally Psych: Normal affect     Data Reviewed:  I have personally reviewed following labs    CBC Lab Results  Component Value Date   WBC 7.9 03/06/2022   RBC 2.84 (L) 03/06/2022   HGB 10.1 (L) 03/07/2022   HCT 30.9 (L) 03/07/2022   MCV 89.4 03/06/2022   MCH 31.0 03/06/2022   PLT 139 (L) 03/06/2022   MCHC 34.6 03/06/2022   RDW 14.7 03/06/2022   LYMPHSABS 1.9 05/01/2011   MONOABS 0.7 05/01/2011   EOSABS 0.3 05/01/2011   BASOSABS 0.1 25/85/2778     Last metabolic panel Lab Results  Component Value Date   NA 140 03/05/2022   K 3.7 03/05/2022   CL 110 03/05/2022   CO2 23 03/05/2022   BUN 11 03/05/2022   CREATININE 0.83 03/05/2022   GLUCOSE 99 03/05/2022   GFRNONAA >60 03/05/2022   GFRAA 81 07/20/2019   CALCIUM 8.3 (L) 03/05/2022   PROT 6.2 (L) 03/01/2022   ALBUMIN 3.7 03/01/2022   BILITOT 0.8 03/01/2022   ALKPHOS 68 03/01/2022   AST 21 03/01/2022   ALT 15 03/01/2022   ANIONGAP 7 03/05/2022    CBG (last 3)  Recent Labs    03/06/22 2131 03/07/22 0623 03/07/22 1124  GLUCAP 139* 95 98      Coagulation Profile: No results for input(s): "INR", "PROTIME" in the last 168 hours.   Radiology Studies: I have personally reviewed the imaging studies  No results found.     Estill Cotta M.D. [facility] 03/07/2022, 2:06 PM  Available via Epic secure chat 7am-7pm After 7 pm, please refer to night coverage provider listed on amion.

## 2022-03-07 NOTE — Interval H&P Note (Signed)
History and Physical Interval Note: No interval changes overnight. Scheduled for flex sig. He thinks the enemas caused some trauma to his rectum, small bloody output with it, scant. Otherwise no pain. Hgb stable. Further recommendations pending the results  03/07/2022 10:55 AM  Dominic Kane  has presented today for surgery, with the diagnosis of anemia.  The various methods of treatment have been discussed with the patient and family. After consideration of risks, benefits and other options for treatment, the patient has consented to  Procedure(s): FLEXIBLE SIGMOIDOSCOPY (N/A) as a surgical intervention.  The patient's history has been reviewed, patient examined, no change in status, stable for surgery.  I have reviewed the patient's chart and labs.  Questions were answered to the patient's satisfaction.     [place]

## 2022-03-07 NOTE — Progress Notes (Signed)
Mobility Specialist Progress Note    03/07/22 1547  Mobility  Activity Ambulated independently in hallway  Level of Assistance Standby assist, set-up cues, supervision of patient - no hands on  Assistive Device None  Distance Ambulated (ft) 1000 ft  Activity Response Tolerated well  Mobility Referral Yes  $Mobility charge 1 Mobility   Pre-Mobility: 84 HR Post-Mobility: 99 HR  Pt received in bed and agreeable. No complaints on walk. Returned to bed with call bell in reach.    Hildred Alamin Mobility Specialist  Please Psychologist, sport and exercise or Rehab Office at [phone]

## 2022-03-07 NOTE — Anesthesia Procedure Notes (Signed)
Procedure Name: MAC Date/Time: 03/07/2022 11:03 AM  Performed by: Lieutenant Diego, CRNAPre-anesthesia Checklist: Patient identified, Emergency Drugs available, Suction available, Patient being monitored and Timeout performed Oxygen Delivery Method: Nasal cannula Preoxygenation: Pre-oxygenation with 100% oxygen Induction Type: IV induction

## 2022-03-07 NOTE — Transfer of Care (Signed)
Immediate Anesthesia Transfer of Care Note  Patient: Dominic Kane  Procedure(s) Performed: FLEXIBLE SIGMOIDOSCOPY  Patient Location: PACU  Anesthesia Type:MAC  Level of Consciousness: awake and alert   Airway & Oxygen Therapy: Patient Spontanous Breathing and Patient connected to nasal cannula oxygen  Post-op Assessment: Report given to RN and Post -op Vital signs reviewed and stable  Post vital signs: Reviewed and stable  Last Vitals:  Vitals Value Taken Time  BP 111/70 03/07/22 1120  Temp    Pulse 53 03/07/22 1122  Resp 30 03/07/22 1122  SpO2 99 % 03/07/22 1122  Vitals shown include unvalidated device data.  Last Pain:  Vitals:   03/07/22 0934  TempSrc:   PainSc: 0-No pain      Patients Stated Pain Goal: 3 (67/34/19 3790)  Complications: No notable events documented.

## 2022-03-07 NOTE — Op Note (Signed)
[facility] Patient Name: Dominic Kane Procedure Date : 03/07/2022 MRN: [id number] Attending MD: Carlota Raspberry. Havery Moros , MD, [id number] Date of Birth: 25-Aug-1940 CSN: [id number] Age: 81 Admit Type: Inpatient Procedure:                Flexible Sigmoidoscopy Indications:              Anemia, prior passed dark stool - EGD / capsule                            without clear cause. Patient had not passed any                            bright red blood per rectum until colonoscopy prep,                            had significant blood / clots in the left colon                            that could not be cleared. Follow up CT negative.                            Follow up flex sig to clear left colon given                            visualization was poor on the last exam. He has not                            had any further bleeding until enemas administered                            for this exam. Providers:                Carlota Raspberry. Havery Moros, MD, Jeanella Cara,                            RN, [facility] Technician, Technician Referring MD:              Medicines:                Monitored Anesthesia Care Complications:            No immediate complications. Estimated blood loss:                            Minimal. Estimated Blood Loss:     Estimated blood loss was minimal. Procedure:                Pre-Anesthesia Assessment:                           - Prior to the procedure, a History and Physical                            was performed, and patient medications and  allergies were reviewed. The patient's tolerance of                            previous anesthesia was also reviewed. The risks                            and benefits of the procedure and the sedation                            options and risks were discussed with the patient.                            All questions were answered, and informed consent                             was obtained. Prior Anticoagulants: The patient has                            taken no anticoagulant or antiplatelet agents. ASA                            Grade Assessment: III - A patient with severe                            systemic disease. After reviewing the risks and                            benefits, the patient was deemed in satisfactory                            condition to undergo the procedure.                           After obtaining informed consent, the scope was                            passed under direct vision. The PCF-HQ190L                            ([id number]) Olympus colonoscope was introduced                            through the anus and advanced to the the sigmoid                            colon. The flexible sigmoidoscopy was accomplished                            without difficulty. The patient tolerated the                            procedure well. The quality of the bowel  preparation was fair. Scope In: 11:01:45 AM Scope Out: 11:10:15 AM Total Procedure Duration: 0 hours 8 minutes 30 seconds  Findings:      The perianal and digital rectal examinations were normal.      Clotted maroon blood with areas of red blood was found in the sigmoid       colon. I could not clear the clots, which prohibited good visualization       of the area.      Multiple medium-mouthed diverticula were found in the sigmoid colon.      The entire examined colon appeared normal of what was visualized. Impression:               - Preparation of the colon was fair due to clot                            burden again in the sigmoid colon                           - Diverticulosis in the sigmoid colon.                           - The entire examined colon is normal.                           - No specimens collected.                           Overall, patient appears to be having intermittent                            diverticular bleeding,  however at baseline prior to                            this admission never had hematochezia, only dark                            stool. Wonder if he is having a very slow                            intermittent diverticular bleed, although still                            have not been able to clear his colon of other                            pathology due to poor visualization in the area.                            Will discuss if he is willing to yet again try                            another full colonoscopy with prep tonight, to see  if we can clear his colon of other pathology. Recommendation:           - Return patient to hospital ward for ongoing care.                           - Clear liquid diet.                           - Continue present medications.                           - Will discuss with him if he is willing to do                            another bowel prep for full colonoscopy / flex sig                            attempt, to clear his left colon. He is not able to                            be discharged at this time with amount of blood in                            his colon, despite his Hgb looking okay today                           - If he has significant rebleeding today would                            repeat CTA. Will discuss with patient Procedure Code(s):        --- Professional ---                           [phone], Sigmoidoscopy, flexible; diagnostic,                            including collection of specimen(s) by brushing or                            washing, when performed (separate procedure) Diagnosis Code(s):        --- Professional ---                           K92.2, Gastrointestinal hemorrhage, unspecified                           K92.1, Melena (includes Hematochezia)                           K57.30, Diverticulosis of large intestine without                            perforation or abscess without  bleeding CPT copyright 2022 American [employer] Association. All rights reserved. The codes documented in this report are  preliminary and upon coder review may  be revised to meet current compliance requirements. Remo Lipps P. Amayah Staheli, MD 03/07/2022 11:21:39 AM This report has been signed electronically. Number of Addenda: 0

## 2022-03-07 NOTE — Anesthesia Preprocedure Evaluation (Signed)
Anesthesia Evaluation  Patient identified by MRN, date of birth, ID band Patient awake    Reviewed: Allergy & Precautions, H&P , NPO status , Patient's Chart, lab work & pertinent test results  Airway Mallampati: II   Neck ROM: full    Dental   Pulmonary former smoker   breath sounds clear to auscultation       Cardiovascular hypertension, + CAD and +CHF   Rhythm:regular Rate:Normal     Neuro/Psych  Headaches  Neuromuscular disease    GI/Hepatic ,GERD  ,,  Endo/Other  diabetes, Type 2Hypothyroidism    Renal/GU      Musculoskeletal  (+) Arthritis ,    Abdominal   Peds  Hematology  (+) Blood dyscrasia, anemia   Anesthesia Other Findings   Reproductive/Obstetrics                             Anesthesia Physical Anesthesia Plan  ASA: 3  Anesthesia Plan: MAC   Post-op Pain Management:    Induction: Intravenous  PONV Risk Score and Plan: 1 and Propofol infusion and Treatment may vary due to age or medical condition  Airway Management Planned: Nasal Cannula  Additional Equipment:   Intra-op Plan:   Post-operative Plan:   Informed Consent: I have reviewed the patients History and Physical, chart, labs and discussed the procedure including the risks, benefits and alternatives for the proposed anesthesia with the patient or authorized representative who has indicated his/her understanding and acceptance.     Dental advisory given  Plan Discussed with: CRNA, Anesthesiologist and Surgeon  Anesthesia Plan Comments:        Anesthesia Quick Evaluation

## 2022-03-08 ENCOUNTER — Encounter (HOSPITAL_COMMUNITY): Payer: Self-pay | Admitting: Internal Medicine

## 2022-03-08 ENCOUNTER — Encounter (HOSPITAL_COMMUNITY)
Admission: EM | Disposition: A | Discharge status: Home / Self Care | Payer: Self-pay | Source: Home / Self Care | Attending: Internal Medicine

## 2022-03-08 ENCOUNTER — Inpatient Hospital Stay (HOSPITAL_COMMUNITY): Payer: PPO | Admitting: Certified Registered"

## 2022-03-08 DIAGNOSIS — K635 Polyp of colon: Secondary | ICD-10-CM

## 2022-03-08 DIAGNOSIS — I509 Heart failure, unspecified: Secondary | ICD-10-CM

## 2022-03-08 DIAGNOSIS — K922 Gastrointestinal hemorrhage, unspecified: Secondary | ICD-10-CM | POA: Diagnosis not present

## 2022-03-08 DIAGNOSIS — I2510 Atherosclerotic heart disease of native coronary artery without angina pectoris: Secondary | ICD-10-CM

## 2022-03-08 DIAGNOSIS — K648 Other hemorrhoids: Secondary | ICD-10-CM

## 2022-03-08 DIAGNOSIS — I5022 Chronic systolic (congestive) heart failure: Secondary | ICD-10-CM | POA: Diagnosis not present

## 2022-03-08 DIAGNOSIS — D62 Acute posthemorrhagic anemia: Secondary | ICD-10-CM | POA: Diagnosis not present

## 2022-03-08 DIAGNOSIS — I959 Hypotension, unspecified: Secondary | ICD-10-CM | POA: Diagnosis not present

## 2022-03-08 DIAGNOSIS — K5731 Diverticulosis of large intestine without perforation or abscess with bleeding: Secondary | ICD-10-CM

## 2022-03-08 DIAGNOSIS — Z87891 Personal history of nicotine dependence: Secondary | ICD-10-CM

## 2022-03-08 DIAGNOSIS — I110 Hypertensive heart disease with heart failure: Secondary | ICD-10-CM

## 2022-03-08 HISTORY — PX: COLONOSCOPY WITH PROPOFOL: SHX5780

## 2022-03-08 LAB — HEMOGLOBIN AND HEMATOCRIT, BLOOD
HCT: 29.7 % — ABNORMAL LOW (ref 39.0–52.0)
HCT: 29.8 % — ABNORMAL LOW (ref 39.0–52.0)
HCT: 29.8 % — ABNORMAL LOW (ref 39.0–52.0)
HCT: 30.1 % — ABNORMAL LOW (ref 39.0–52.0)
Hemoglobin: 10.1 g/dL — ABNORMAL LOW (ref 13.0–17.0)
Hemoglobin: 10.1 g/dL — ABNORMAL LOW (ref 13.0–17.0)
Hemoglobin: 9.9 g/dL — ABNORMAL LOW (ref 13.0–17.0)
Hemoglobin: 9.9 g/dL — ABNORMAL LOW (ref 13.0–17.0)

## 2022-03-08 LAB — GLUCOSE, CAPILLARY
Glucose-Capillary: 103 mg/dL — ABNORMAL HIGH (ref 70–99)
Glucose-Capillary: 94 mg/dL (ref 70–99)
Glucose-Capillary: 89 mg/dL (ref 70–99)
Glucose-Capillary: 136 mg/dL — ABNORMAL HIGH (ref 70–99)
Glucose-Capillary: 120 mg/dL — ABNORMAL HIGH (ref 70–99)

## 2022-03-08 SURGERY — COLONOSCOPY WITH PROPOFOL
Anesthesia: Monitor Anesthesia Care

## 2022-03-08 MED ORDER — PROPOFOL 10 MG/ML IV BOLUS
INTRAVENOUS | Status: DC | PRN
  Administered 2022-03-08: 20 mg via INTRAVENOUS

## 2022-03-08 MED ORDER — PROPOFOL 500 MG/50ML IV EMUL
INTRAVENOUS | Status: DC | PRN
  Administered 2022-03-08: 100 ug/kg/min via INTRAVENOUS

## 2022-03-08 MED ORDER — LACTATED RINGERS IV SOLN: INTRAVENOUS | Status: DC

## 2022-03-08 MED ORDER — LIDOCAINE 2% (20 MG/ML) 5 ML SYRINGE
INTRAMUSCULAR | Status: DC | PRN
  Administered 2022-03-08: 60 mg via INTRAVENOUS

## 2022-03-08 MED ORDER — PHENYLEPHRINE 80 MCG/ML (10ML) SYRINGE FOR IV PUSH (FOR BLOOD PRESSURE SUPPORT)
PREFILLED_SYRINGE | INTRAVENOUS | Status: DC | PRN
  Administered 2022-03-08 (×2): 80 ug via INTRAVENOUS

## 2022-03-08 SURGICAL SUPPLY — 22 items

## 2022-03-08 NOTE — Anesthesia Preprocedure Evaluation (Addendum)
Anesthesia Evaluation  Patient identified by MRN, date of birth, ID band Patient awake    Reviewed: Allergy & Precautions, NPO status , Patient's Chart, lab work & pertinent test results  Airway Mallampati: II       Dental no notable dental hx.    Pulmonary former smoker   Pulmonary exam normal        Cardiovascular hypertension, Pt. on medications + CAD and +CHF   Rhythm:Regular Rate:Normal     Neuro/Psych  Headaches  negative psych ROS   GI/Hepatic Neg liver ROS,GERD  Medicated,,  Endo/Other  diabetesHypothyroidism    Renal/GU negative Renal ROS  negative genitourinary   Musculoskeletal  (+) Arthritis , Osteoarthritis,    Abdominal Normal abdominal exam  (+)   Peds  Hematology  (+) Blood dyscrasia, anemia   Anesthesia Other Findings   Reproductive/Obstetrics                             Anesthesia Physical Anesthesia Plan  ASA: 3  Anesthesia Plan: MAC   Post-op Pain Management:    Induction: Intravenous  PONV Risk Score and Plan: 1 and Propofol infusion and Treatment may vary due to age or medical condition  Airway Management Planned: Simple Face Mask, Natural Airway and Nasal Cannula  Additional Equipment: None  Intra-op Plan:   Post-operative Plan:   Informed Consent: I have reviewed the patients History and Physical, chart, labs and discussed the procedure including the risks, benefits and alternatives for the proposed anesthesia with the patient or authorized representative who has indicated his/her understanding and acceptance.     Dental advisory given  Plan Discussed with: CRNA  Anesthesia Plan Comments:        Anesthesia Quick Evaluation

## 2022-03-08 NOTE — Progress Notes (Signed)
[facility]                                                                              Cinque Begley, is a 81 y.o. male, DOB - 07-03-1940, EHU:[id number] Admit date - 03/01/2022    Outpatient Primary MD for the patient is Jinny Sanders, MD  LOS - 6  days  Chief Complaint  Patient presents with   Chest Pain       Brief summary    Dominic Kane is a 81 y.o. 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 Patient also reported that he had red blood per rectum initially, but over the last week stools turned to black.  Patient was admitted for further workup.  GI consulted.  Assessment & Plan    Principal Problem: Acute blood loss anemia secondary to GI bleed -FOBT, received 3 units of packed RBCs -GI was consulted, EGD showed no signs of recent bleeding, possible chronic GI blood loss secondary to gastric erosions -Colonoscopy on 12/12 showed significant blood clot burden in the entire large colon. -CT angio showed no definitive evidence of active GI bleeding -underwent capsule endoscopy on 12/13, showed red spot in distal small bowel - underwent flex sig on 12/15 with diverticulosis, showed clotted maroon blood with areas of red blood in the sigmoid colon.  -Continue PPI, plan for colonoscopy today  -H&H stable 10.1  Active problems Hypokalemia -Stable   Transient hypotension with underlying history of systolic congestive heart failure, essential hypertension -BP stable, currently terazosin, ramipril held   Chronic systolic CHF -2D echo in 7858 last EF noted to be 30 to 35% with grade 1 DD -Resumed Lasix 20 mg daily.  (outpatient dose 40 mg daily)  Controlled diabetes mellitus type 2, without long-term use of  insulin Last available hemoglobin A1c 6.8 on 10/22/2021 -CBGs able, continue SSI while inpatient Recent Labs    03/07/22 0623 03/07/22 1124 03/07/22 1625 03/07/22 2120 03/08/22 0600 03/08/22 1137  GLUCAP 95 98 104* 94 103* 94   '   Hypothyroidism History of papillary thyroid cancer Status post total thyroidectomy for papillary thyroid cancer back in 2018 by Dr. Harlow Asa - Last TSH 4.48 in 07/22/2021. -Continue Synthroid   Hyperlipidemia -Continue Crestor   BPH -Continue finasteride    Code Status: Full code DVT Prophylaxis:  SCDs Start: 03/01/22 1505   Level of Care: Level of care: Med-Surg Family Communication: Updated patient's wife at the bedside   Disposition Plan:      Remains inpatient appropriate: Undergoing colonoscopy  Procedures:  EGD, colonoscopy, CT angiogram, capsule endoscopy, flex sig  Consultants:   GI  Antimicrobials: None    Medications  [MAR Hold] finasteride  5 mg Oral Daily   [MAR Hold] furosemide  20 mg Oral Daily   [MAR Hold] insulin aspart  0-15 Units Subcutaneous TID WC   [MAR Hold] levothyroxine  150 mcg Oral Q0600   [MAR Hold] melatonin  5 mg Oral QHS   [MAR Hold] pantoprazole  40 mg Oral Daily   [MAR Hold] rosuvastatin  10 mg Oral Daily   [MAR Hold] sodium chloride flush  3 mL Intravenous Q12H      Subjective:   Dominic Kane was seen and examined today.  Somewhat frustrated with GI bleeding issue.  Wife at the bedside.  No nausea vomiting or abdominal pain.  No fevers  Objective:   Vitals:   03/08/22 0551 03/08/22 0731 03/08/22 1130 03/08/22 1203  BP: 125/70 126/65 129/65 (!) 145/76  Pulse: 84 63 64 (!) 59  Resp:  '17 17 20  '$ Temp: 97.7 F (36.5 C) 97.8 F (36.6 C) 98.1 F (36.7 C) 97.9 F (36.6 C)  TempSrc: Oral Oral Oral Temporal  SpO2: 94% 93% 94% 95%  Weight:    91.9 kg  Height:    '5\' 10"'$  (1.778 m)    Intake/Output Summary (Last 24 hours) at 03/08/2022 1222 Last data filed at 03/08/2022 1100 Gross per 24  hour  Intake 1080 ml  Output 1 ml  Net 1079 ml     Wt Readings from Last 3 Encounters:  03/08/22 91.9 kg  10/29/21 93.5 kg  07/22/21 94.4 kg   Physical Exam General: Alert and oriented x 3, NAD Cardiovascular: S1 S2 clear, RRR.  Respiratory: CTAB Gastrointestinal: Soft, nontender, nondistended, NBS Ext: no pedal edema bilaterally Neuro: no new deficits Psych: Normal affect     Data Reviewed:  I have personally reviewed following labs    CBC Lab Results  Component Value Date   WBC 7.9 03/06/2022   RBC 2.84 (L) 03/06/2022   HGB 10.1 (L) 03/08/2022   HCT 29.8 (L) 03/08/2022   MCV 89.4 03/06/2022   MCH 31.0 03/06/2022   PLT 139 (L) 03/06/2022   MCHC 34.6 03/06/2022   RDW 14.7 03/06/2022   LYMPHSABS 1.9 05/01/2011   MONOABS 0.7 05/01/2011   EOSABS 0.3 05/01/2011   BASOSABS 0.1 92/33/0076     Last metabolic panel Lab Results  Component Value Date   NA 142 03/07/2022   K 4.3 03/07/2022   CL 107 03/07/2022   CO2 27 03/07/2022   BUN 8 03/07/2022   CREATININE 0.91 03/07/2022   GLUCOSE 100 (H) 03/07/2022   GFRNONAA >60 03/07/2022   GFRAA 81 07/20/2019   CALCIUM 8.7 (L) 03/07/2022   PROT 6.2 (L) 03/01/2022   ALBUMIN 3.7 03/01/2022   BILITOT 0.8 03/01/2022   ALKPHOS 68 03/01/2022   AST 21 03/01/2022   ALT 15 03/01/2022   ANIONGAP 8 03/07/2022    CBG (last 3)  Recent Labs    03/07/22 2120 03/08/22 0600 03/08/22 1137  GLUCAP 94 103* 94      Coagulation Profile: No results for input(s): "INR", "PROTIME" in the last 168 hours.   Radiology Studies: I have personally reviewed the imaging studies  No results found.     Estill Cotta M.D. [facility] 03/08/2022, 12:22 PM  Available via Epic secure chat 7am-7pm After 7 pm, please refer to night coverage provider listed on amion.

## 2022-03-08 NOTE — Op Note (Signed)
[facility] Patient Name: Dominic Kane Procedure Date : 03/08/2022 MRN: [id number] Attending MD: Carlota Raspberry. Havery Moros , MD, [id number] Date of Birth: [birth year] CSN: [id number] Age: 81 Admit Type: Inpatient Procedure:                Colonoscopy Indications:              recurrent gastrointestinal bleeding - dark stools                            intially with negative EGD. First colonoscopy                            aborted due to poor prep. Second colonoscopy with                            double prep cleared right and transverse colon but                            bright red blood and clots throughout left colon                            could not visualize. CTA negative for active bleed.                            Capsule endoscopy negative. Flex sig yesterday to                            clear left colon again showed blood and clots there                            with poor visualization. Another prep and half                            overnight with repeat colonoscopy today in an                            effort to definitively clear the left colon of                            other pathology. Suspected intermittent divericular                            bleeding Providers:                Remo Lipps P. Havery Moros, MD, Jaci Carrel, RN,                            Darliss Cheney, Technician, Mardene Celeste "Trish" Durene Romans,                            CRNA Referring MD:              Medicines:                Monitored Anesthesia Care Complications:  No immediate complications. Estimated blood loss:                            None. Estimated Blood Loss:     Estimated blood loss: none. Procedure:                Pre-Anesthesia Assessment:                           - Prior to the procedure, a History and Physical                            was performed, and patient medications and                            allergies were reviewed. The patient's tolerance of                             previous anesthesia was also reviewed. The risks                            and benefits of the procedure and the sedation                            options and risks were discussed with the patient.                            All questions were answered, and informed consent                            was obtained. Prior Anticoagulants: The patient has                            taken no anticoagulant or antiplatelet agents. ASA                            Grade Assessment: III - A patient with severe                            systemic disease. After reviewing the risks and                            benefits, the patient was deemed in satisfactory                            condition to undergo the procedure.                           After obtaining informed consent, the colonoscope                            was passed under direct vision. Throughout the  procedure, the patient's blood pressure, pulse, and                            oxygen saturations were monitored continuously. The                            PCF-[id number] ([id number] Olympus slim colonoscope was                            introduced through the anus and advanced to the the                            cecum, identified by the ileocecal valve. The                            colonoscopy was performed without difficulty. The                            patient tolerated the procedure well. The quality                            of the bowel preparation was fair. The ileocecal                            valve and the rectum were photographed. Scope In: 12:23:18 PM Scope Out: 12:49:36 PM Scope Withdrawal Time: 0 hours 16 minutes 15 seconds  Total Procedure Duration: 0 hours 26 minutes 18 seconds  Findings:      The perianal and digital rectal examinations were normal.      Many large-mouthed diverticula were found in the entire colon. Severe in       the left colon      A  large amount of liquid stool was found in the entire colon, making       visualization difficult initially. Lavage of the colon was performed       using copious amounts of sterile water, resulting in clearance with       adequate visualization in most of the colon with exception of the cecum.       AO not seen due to residual stool. Time not taken to do this today given       it was cleared and normal on more recent colonoscopy.      Internal hemorrhoids were found during retroflexion.      There were a few diminutive polyps in the right colon / hepatic flexure,       not removed today. The exam was otherwise normal throughout the examined       colon. No active bleeding. Brown stool throughout with some fragments of       old clot but nothing fresh. Nothing in the sigmoid colon other than       severe diverticulosis. Impression:               - Preparation of the colon was fair but extensively                            lavaged as above to clear the left colon.                           -  Severe diverticulosis in the entire examined                            colon, worst in the left colon.                           - A few diminutive polyps in the right colon /                            hepatic flexure, not removed today.                           - Internal hemorrhoids.                           No active bleeding. Left colon cleared. Severe                            diverticulosis without other concerning pathology.                            Suspect patient had a slow / stuttering left sided                            diverticular bleed which has since stopped based on                            results of this exam. Recommendation:           - Return patient to hospital ward for ongoing care.                           - Advance diet as tolerated.                           - Continue present medications.                           - Monitor Hgb and for recurrent bleeding                            - If he has recurrent bleeding, recommend repeat                            CTA or tagged RBC scan. No further colonoscopy                            exams planned                           - Call with questions, we will reassess him tomorrow Procedure Code(s):        --- Professional ---                           [phone], Colonoscopy, flexible; diagnostic, including  collection of specimen(s) by brushing or washing,                            when performed (separate procedure) Diagnosis Code(s):        --- Professional ---                           K92.2, Gastrointestinal hemorrhage, unspecified                           K57.30, Diverticulosis of large intestine without                            perforation or abscess without bleeding CPT copyright 2022 American Medical Association. All rights reserved. The codes documented in this report are preliminary and upon coder review may  be revised to meet current compliance requirements. Remo Lipps P. Cipriana Biller, MD 03/08/2022 1:03:09 PM This report has been signed electronically. Number of Addenda: 0

## 2022-03-08 NOTE — Interval H&P Note (Signed)
History and Physical Interval Note: Patient had flex sig done yesterday with blood / clots in the left colon with poor visualization. Suspect intermittent diverticular bleeding but have not been able to clear his colon in the left side due to residual blood clots / stool. He did a prep overnight, reports passing blood intermittent with this. Hgb stable at this time. He is agreeable to colonoscopy today in an effort to clear his left colon and ensure no other cause for bleeding. I have discussed risks / benefits with him, he wishes to proceed, further recommendations pending the results. All questions answered. He is otherwise feeling at baseline.     03/08/2022 12:02 PM  Grace Bushy Viger  has presented today for surgery, with the diagnosis of lower GI bleed.  The various methods of treatment have been discussed with the patient and family. After consideration of risks, benefits and other options for treatment, the patient has consented to  Procedure(s): COLONOSCOPY WITH PROPOFOL (N/A) as a surgical intervention.  The patient's history has been reviewed, patient examined, no change in status, stable for surgery.  I have reviewed the patient's chart and labs.  Questions were answered to the patient's satisfaction.     [place]

## 2022-03-08 NOTE — Transfer of Care (Signed)
Immediate Anesthesia Transfer of Care Note  Patient: Dominic Kane  Procedure(s) Performed: COLONOSCOPY WITH PROPOFOL  Patient Location: PACU  Anesthesia Type:MAC  Level of Consciousness: awake, alert , oriented, and patient cooperative  Airway & Oxygen Therapy: Patient Spontanous Breathing and Patient connected to nasal cannula oxygen  Post-op Assessment: Report given to RN and Post -op Vital signs reviewed and stable  Post vital signs: Reviewed and stable  Last Vitals:  Vitals Value Taken Time  BP    Temp    Pulse 68 03/08/22 1259  Resp 26 03/08/22 1259  SpO2 91 % 03/08/22 1259  Vitals shown include unvalidated device data.  Last Pain:  Vitals:   03/08/22 1203  TempSrc: Temporal  PainSc: 0-No pain      Patients Stated Pain Goal: 3 (75/30/10 4045)  Complications: No notable events documented.

## 2022-03-09 ENCOUNTER — Encounter (HOSPITAL_COMMUNITY): Payer: Self-pay | Admitting: Gastroenterology

## 2022-03-09 DIAGNOSIS — K922 Gastrointestinal hemorrhage, unspecified: Secondary | ICD-10-CM | POA: Diagnosis not present

## 2022-03-09 DIAGNOSIS — I5022 Chronic systolic (congestive) heart failure: Secondary | ICD-10-CM | POA: Diagnosis not present

## 2022-03-09 DIAGNOSIS — D62 Acute posthemorrhagic anemia: Secondary | ICD-10-CM | POA: Diagnosis not present

## 2022-03-09 DIAGNOSIS — I959 Hypotension, unspecified: Secondary | ICD-10-CM | POA: Diagnosis not present

## 2022-03-09 LAB — HEMOGLOBIN AND HEMATOCRIT, BLOOD
HCT: 27.7 % — ABNORMAL LOW (ref 39.0–52.0)
HCT: 28.2 % — ABNORMAL LOW (ref 39.0–52.0)
Hemoglobin: 9.3 g/dL — ABNORMAL LOW (ref 13.0–17.0)
Hemoglobin: 9.4 g/dL — ABNORMAL LOW (ref 13.0–17.0)

## 2022-03-09 LAB — GLUCOSE, CAPILLARY: Glucose-Capillary: 85 mg/dL (ref 70–99)

## 2022-03-09 MED ORDER — POLYETHYLENE GLYCOL 3350 17 G PO PACK: 17.0000 g | PACK | Freq: Two times a day (BID) | ORAL | 3 refills | Status: DC

## 2022-03-09 NOTE — Anesthesia Postprocedure Evaluation (Signed)
Anesthesia Post Note  Patient: Dominic Kane  Procedure(s) Performed: COLONOSCOPY WITH PROPOFOL     Patient location during evaluation: PACU Anesthesia Type: MAC Level of consciousness: awake and alert Pain management: pain level controlled Vital Signs Assessment: post-procedure vital signs reviewed and stable Respiratory status: spontaneous breathing, nonlabored ventilation, respiratory function stable and patient connected to nasal cannula oxygen Cardiovascular status: stable and blood pressure returned to baseline Postop Assessment: no apparent nausea or vomiting Anesthetic complications: no   No notable events documented.  Last Vitals:  Vitals:   03/08/22 2315 03/09/22 0356  BP: (!) 112/58 115/77  Pulse: 64 62  Resp: 19   Temp: 36.8 C 36.6 C  SpO2: 98% 98%    Last Pain:  Vitals:   03/09/22 0356  TempSrc: Oral  PainSc:                  March Rummage Chavez Rosol

## 2022-03-09 NOTE — TOC Transition Note (Signed)
Transition of Care [facility]) - CM/SW Discharge Note   Patient Details  Name: Dominic Kane MRN: [id number] Date of Birth: 1940-09-10  Transition of Care [facility]) CM/SW Contact:  Zenon Mayo, RN Phone Number: 03/09/2022, 8:18 AM   Clinical Narrative:    Patient is for dc today, has no needs.          Patient Goals and CMS Choice        Discharge Placement                       Discharge Plan and Services                                     Social Determinants of Health (SDOH) Interventions     Readmission Risk Interventions     No data to display

## 2022-03-09 NOTE — Discharge Summary (Signed)
Physician Discharge Summary   Patient: Dominic Kane MRN: [id number] DOB: 03-Feb-1941  Admit date:     03/01/2022  Discharge date: 03/09/22  Discharge Physician: Estill Cotta, MD    PCP: Jinny Sanders, MD   Recommendations at discharge:   MiraLAX 17 g p.o. twice daily, avoid constipation  Discharge Diagnoses:  Acute lower GI bleed secondary to severe diverticulosis   Diverticulosis of colon with hemorrhage   Acute blood loss anemia   Transient hypotension    Chronic systolic CHF (congestive heart failure) ([place])   Controlled type 2 diabetes with retinopathy ([place])   Hypothyroidism   Hyperlipidemia   BPH (benign prostatic hyperplasia)   Anemia due to chronic blood loss   Iron deficiency anemia      Hospital Course:  Dominic Kane is a 81 y.o. 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 Patient also reported that he had red blood per rectum initially, but over the last week stools turned to black.  Patient was admitted for further workup.  GI consulted.  Assessment and Plan:  Acute blood loss anemia secondary to GI bleed -GI was consulted, EGD showed no signs of recent bleeding, possible chronic GI blood loss secondary to gastric erosions -Colonoscopy on 12/12 showed significant blood clot burden in the entire large colon. -CT angio showed no definitive evidence of active GI bleeding -underwent capsule endoscopy on 12/13, showed red spot in distal small bowel - underwent flex sig on 12/15 with diverticulosis, showed clotted maroon blood with areas of red blood in the sigmoid colon.  -Patient received total 5 units of packed RBC transfusion -Underwent colonoscopy again on 12/16, showed severe diverticulosis in the entire examined  colon, worse in the left colon, internal hemorrhoids.  No active bleeding.  Suspected patient had a slow/stuttering left-sided diverticular bleed, has since stopped -H&H now remained stable for over 48 hours, hemoglobin 9.4 at discharge. -Tolerating diet, cleared from GI perspective.  Recommended to avoid constipation and MiraLAX twice daily    Hypokalemia -Stable   Transient hypotension with underlying history of systolic congestive heart failure, essential hypertension -BP stable, continue Lasix, terazosin, ramipril   Chronic systolic CHF -2D echo in 0102 last EF noted to be 30 to 35% with grade 1 DD -Continue Lasix   Controlled diabetes mellitus type 2, without long-term use of insulin Last available hemoglobin A1c 6.8 on 10/22/2021 Continue outpatient regimen'   Hypothyroidism History of papillary thyroid cancer Status post total thyroidectomy for papillary thyroid cancer back in 2018 by Dr. Harlow Asa - Last TSH 4.48 in 07/22/2021. -Continue Synthroid   Hyperlipidemia -Continue Crestor   BPH -Continue finasteride     Pain control - [place] Controlled Substance Reporting System database was reviewed. and patient was instructed, not to drive, operate heavy machinery, perform activities at heights, swimming or participation in water activities or provide baby-sitting services while on Pain, Sleep and Anxiety Medications; until their outpatient Physician has advised to do so again. Also recommended to not to take more than prescribed Pain, Sleep and Anxiety Medications.  Consultants: GI Procedures performed: Colonoscopy x2, EGD, CT angiogram, capsule endoscopy, flex sig Disposition: Home Diet recommendation:  Soft diet DISCHARGE MEDICATION: Allergies as of 03/09/2022       Reactions   Codeine Nausea Only   Sulfonamide  Derivatives Hives, Nausea And Vomiting        Medication List     STOP taking these medications    aspirin EC 81 MG tablet    aspirin-acetaminophen-caffeine 250-250-65 MG tablet Commonly known as: EXCEDRIN MIGRAINE       TAKE these medications    acetaminophen 500 MG tablet Commonly known as: TYLENOL Take 1,000 mg by mouth as needed for moderate pain.   cetirizine 10 MG tablet Commonly known as: ZYRTEC Take 10 mg by mouth as needed for allergies.   CORICIDIN HBP COUGH/COLD PO Take 1 tablet by mouth daily as needed (congestion).   finasteride 5 MG tablet Commonly known as: PROSCAR Take 1 tablet (5 mg total) by mouth daily.   furosemide 40 MG tablet Commonly known as: LASIX TAKE 1 TABLET BY MOUTH  DAILY (DUE FOR AN APPT IN  APRIL AND CAN GET YEARLY  SUPPLY AT THAT VISIT.   PLEASE CALL TO SCHEDULE) What changed: See the new instructions.   levothyroxine 150 MCG tablet Commonly known as: SYNTHROID TAKE 1 TABLET BY MOUTH EVERY DAY BEFORE BREAKFAST What changed:  how much to take how to take this when to take this additional instructions   Melatonin 5 MG Caps Take 5 mg by mouth at bedtime.   omeprazole 20 MG capsule Commonly known as: PRILOSEC Take 1 capsule (20 mg total) by mouth daily.   polyethylene glycol 17 g packet Commonly known as: MIRALAX / GLYCOLAX Take 17 g by mouth 2 (two) times daily. For constipation. Also available OTC.   PRESCRIPTION MEDICATION every 6 (six) months. *Antibiotic Injection in Eye*   PRESERVISION AREDS 2 PO Take 1 tablet by mouth in the morning and at bedtime.   CENTRUM SILVER 50+MEN PO Take 1 tablet by mouth at bedtime.   ramipril 2.5 MG capsule Commonly known as: ALTACE TAKE 1 CAPSULE BY MOUTH EVERY DAY What changed:  how much to take how to take this when to take this additional instructions   rosuvastatin 10 MG tablet Commonly known as: CRESTOR Take 1 tablet (10 mg total) by mouth daily.   terazosin 10 MG capsule Commonly known as: HYTRIN Take 1 capsule (10 mg total) by mouth daily. What changed: when to take this   VITAMIN D-3 PO Take  1 capsule by mouth at bedtime.        Follow-up Information     Armbruster, Carlota Raspberry, MD. Schedule an appointment as soon as possible for a visit in 2 week(s).   Specialty: Gastroenterology Why: for hospital follow-up Contact information: [place] [address] [phone]         Jinny Sanders, MD. Schedule an appointment as soon as possible for a visit in 2 week(s).   Specialty: Family Medicine Why: for hospital follow-up Contact information: [place] [address] [phone]                Discharge Exam: Danley Danker Weights   03/08/22 0425 03/08/22 1203 03/09/22 0607  Weight: 91.9 kg 91.9 kg 91.9 kg   S: Tolerating diet without any difficulty, no further bleeding, feels close to his baseline.  Ambulating  BP 114/69 (BP Location: Right Arm)   Pulse 65   Temp 98 F (36.7 C) (Oral)   Resp 16   Ht '5\' 10"'$  (1.778 m)   Wt 91.9 kg   SpO2 95%   BMI 29.07 kg/m   Physical Exam General: Alert and oriented x 3,  NAD Cardiovascular: S1 S2 clear, RRR.  Respiratory: CTAB, no wheezing, rales or rhonchi Gastrointestinal: Soft, nontender, nondistended, NBS Ext: no pedal edema bilaterally Neuro: no new deficits Psych: Normal affect   Condition at discharge: fair  The results of significant diagnostics from this hospitalization (including imaging, microbiology, ancillary and laboratory) are listed below for reference.   Imaging Studies: CT ANGIO GI BLEED  Result Date: 03/04/2022 CLINICAL DATA:  Lower gastrointestinal bleeding. EXAM: CTA ABDOMEN AND PELVIS WITHOUT AND WITH CONTRAST TECHNIQUE: Multidetector CT imaging of the abdomen and pelvis was performed using the standard protocol during bolus administration of intravenous contrast. Multiplanar reconstructed images and MIPs were obtained and reviewed to evaluate the vascular anatomy. RADIATION DOSE REDUCTION: This exam was performed according to the departmental  dose-optimization program which includes automated exposure control, adjustment of the mA and/or kV according to patient size and/or use of iterative reconstruction technique. CONTRAST:  155m OMNIPAQUE IOHEXOL 350 MG/ML SOLN COMPARISON:  January 23, 2013. FINDINGS: VASCULAR Aorta: Atherosclerosis of abdominal aorta is noted without aneurysm or dissection. Celiac: Patent without evidence of aneurysm, dissection, vasculitis or significant stenosis. SMA: Patent without evidence of aneurysm, dissection, vasculitis or significant stenosis. Renals: Both renal arteries are patent without evidence of aneurysm, dissection, vasculitis, fibromuscular dysplasia or significant stenosis. IMA: Patent without evidence of aneurysm, dissection, vasculitis or significant stenosis. Inflow: Patent without evidence of aneurysm, dissection, vasculitis or significant stenosis. Proximal Outflow: Bilateral common femoral and visualized portions of the superficial and profunda femoral arteries are patent without evidence of aneurysm, dissection, vasculitis or significant stenosis. Veins: No obvious venous abnormality within the limitations of this arterial phase study. Review of the MIP images confirms the above findings. NON-VASCULAR Lower chest: No acute abnormality. Hepatobiliary: No focal liver abnormality is seen. No gallstones, gallbladder wall thickening, or biliary dilatation. Pancreas: Unremarkable. No pancreatic ductal dilatation or surrounding inflammatory changes. Spleen: Normal in size without focal abnormality. Adrenals/Urinary Tract: Adrenal glands appear normal. Bilateral renal cysts are noted for which no further follow-up is required. No hydronephrosis or renal obstruction is noted. Urinary bladder is unremarkable. Stomach/Bowel: Stomach is unremarkable. Diverticulosis of descending and sigmoid colon is noted without inflammation. No definite evidence of active gastrointestinal bleeding is noted. The appendix is  unremarkable. Lymphatic: No adenopathy is noted. Reproductive: Prostate is unremarkable. Other: No abdominal wall hernia or abnormality. No abdominopelvic ascites. Musculoskeletal: No acute or significant osseous findings. IMPRESSION: VASCULAR Atherosclerosis of abdominal aorta is noted without aneurysm or dissection. No significant mesenteric or renal artery stenosis is noted. No definite evidence of active gastrointestinal bleeding. NON-VASCULAR Diverticulosis of descending and sigmoid colon is noted without inflammation. Electronically Signed   By: JMarijo ConceptionM.D.   On: 03/04/2022 13:10   DG Chest Portable 1 View  Result Date: 03/01/2022 CLINICAL DATA:  Chest pain EXAM: PORTABLE CHEST 1 VIEW COMPARISON:  07/30/16 CXR FINDINGS: The heart size and mediastinal contours are within normal limits. Both lungs are clear. The visualized skeletal structures are unremarkable. Aortic atherosclerotic calcifications. IMPRESSION: No active disease. Electronically Signed   By: HMarin RobertsM.D.   On: 03/01/2022 14:06    Microbiology: Results for orders placed or performed during the hospital encounter of 03/01/22  MRSA Next Gen by PCR, Nasal     Status: None   Collection Time: 03/01/22  5:10 PM   Specimen: Nasal Mucosa; Nasal Swab  Result Value Ref Range Status   MRSA by PCR Next Gen NOT DETECTED NOT DETECTED Final    Comment: (NOTE) The GeneXpert  MRSA Assay (FDA approved for NASAL specimens only), is one component of a comprehensive MRSA colonization surveillance program. It is not intended to diagnose MRSA infection nor to guide or monitor treatment for MRSA infections. Test performance is not FDA approved in patients less than 74 years old. Performed at [facility], [place] [address] [place], [place] [address]     Labs: CBC: Recent Labs  Lab 03/03/22 0028 03/03/22 2143 03/04/22 0019 03/04/22 1411 03/05/22 0023 03/05/22 1917 03/06/22 0021 03/07/22 0636 03/08/22 1696 03/08/22 1433  03/08/22 1845 03/09/22 0019 03/09/22 0650  WBC 9.7  --  7.4  --  7.1  --  7.9  --   --   --   --   --   --   HGB 8.9*   < > 8.1*   < > 7.4*   < > 8.8*   < > 10.1* 10.1* 9.9* 9.3* 9.4*  HCT 25.9*   < > 23.8*   < > 22.8*   < > 25.4*   < > 29.8* 30.1* 29.8* 27.7* 28.2*  MCV 91.8  --  91.9  --  93.4  --  89.4  --   --   --   --   --   --   PLT 174  --  159  --  142*  --  139*  --   --   --   --   --   --    < > = values in this interval not displayed.   Basic Metabolic Panel: Recent Labs  Lab 03/03/22 0028 03/04/22 0019 03/05/22 0023 03/07/22 0636  NA 138 143 140 142  K 3.8 3.3* 3.7 4.3  CL 108 111 110 107  CO2 20* '22 23 27  '$ GLUCOSE 108* 114* 99 100*  BUN 26* '17 11 8  '$ CREATININE 0.91 0.85 0.83 0.91  CALCIUM 8.6* 8.5* 8.3* 8.7*   Liver Function Tests: No results for input(s): "AST", "ALT", "ALKPHOS", "BILITOT", "PROT", "ALBUMIN" in the last 168 hours. CBG: Recent Labs  Lab 03/08/22 1137 03/08/22 1312 03/08/22 1640 03/08/22 2130 03/09/22 0605  GLUCAP 94 89 136* 120* 85    Discharge time spent: greater than 30 minutes.  Signed: Estill Cotta, MD [facility] 03/09/2022

## 2022-03-09 NOTE — Progress Notes (Signed)
Progress Note   Subjective  Patient is doing well.  He has not had any bleeding for 2 days now.  Eating breakfast.  Hemoglobin stable.  No pain.    Objective   Vital signs in last 24 hours: Temp:  [97.8 F (36.6 C)-98.3 F (36.8 C)] 97.9 F (36.6 C) (12/17 0356) Pulse Rate:  [54-80] 62 (12/17 0356) Resp:  [15-26] 19 (12/16 2315) BP: (101-145)/(57-81) 115/77 (12/17 0356) SpO2:  [89 %-100 %] 98 % (12/17 0356) Weight:  [91.9 kg] 91.9 kg (12/17 0607) Last BM Date : 03/08/22 General:    white male in NAD Neurologic:  Alert and oriented,  grossly normal neurologically. Psych:  Cooperative. Normal mood and affect.  Intake/Output from previous day: 12/16 0701 - 12/17 0700 In: 241.3 [P.O.:240; I.V.:1.3] Out: 0  Intake/Output this shift: No intake/output data recorded.  Lab Results: Recent Labs    03/08/22 1845 03/09/22 0019 03/09/22 0650  HGB 9.9* 9.3* 9.4*  HCT 29.8* 27.7* 28.2*   BMET Recent Labs    03/07/22 0636  NA 142  K 4.3  CL 107  CO2 27  GLUCOSE 100*  BUN 8  CREATININE 0.91  CALCIUM 8.7*   LFT No results for input(s): "PROT", "ALBUMIN", "AST", "ALT", "ALKPHOS", "BILITOT", "BILIDIR", "IBILI" in the last 72 hours. PT/INR No results for input(s): "LABPROT", "INR" in the last 72 hours.  Studies/Results: No results found.     Assessment / Plan:    81 year old male here with the following:  Recurrent low-grade sigmoid colon diverticular bleed Anemia  Initially came in with dark stools and anemia.  EGD on 12/10 without clear cause.  Initial colonoscopy on 12/11 aborted due to poor prep, repeat colonoscopy 12/12 showed active bleed in the left colon with clots, could not visualize left colon due to clot burden, but right colon was cleared.  CTA was negative for active bleeding at that time.  Capsule endoscopy performed without concerning cause for symptoms.  Follow-up flex sig to clear left colon was limited again by clot burden in the left colon  with bright red blood, as he had a recurrent bleed.  Repeat colonoscopy yesterday finally cleared the left colon -he has severe diverticulosis that was intermittently bleeding, no active bleeding on that exam, no other pathology noted to cause this. He has not had bleeding in 2 days now and hemoglobin is stable.  He has significantly slow transit with baseline constipation, took a few preps to clear his colon for colonoscopy.  I think he had such slow transit that despite having a left-sided diverticular bleed, blood was coming out mostly dark / old.  We discussed diverticular bleeding in general, fortunately I think he has stopped bleeding on his own over the past few days.  We discussed that there are risks for recurrent bleeding moving forward and he will keep an eye out for this.  Recommend baseline MiraLAX daily to few times daily to keep his bowels moving more regularly.  Okay to go home today.  We will have him follow-up in our office for reassessment.  He had a few small polyps in his colon that were not removed during this course however they are benign and I doubt likely to ever cause him a problem, he can discuss with Dr. Carlean Purl if he wants these removed but I do not feel strongly that they do.  PLAN: - discharge home today - Miralax daily to BID - office follow up to be coordinated - contact  us with any recurrent bleeding  Call with questions.  Jolly Mango, MD [facility]

## 2022-03-10 ENCOUNTER — Encounter (HOSPITAL_COMMUNITY): Payer: Self-pay | Admitting: Gastroenterology

## 2022-03-10 ENCOUNTER — Telehealth: Payer: Self-pay

## 2022-03-10 NOTE — Telephone Encounter (Signed)
Pt was made aware of Dr. Havery Moros recommendations: Pt was scheduled to see Dr. Carlean Purl on 04/22/2022 at 10:10 am: Pt made aware: Pt verbalized understanding with all questions answered.

## 2022-03-10 NOTE — Telephone Encounter (Signed)
outpatient follow up Received: Lilli Few, Carlota Raspberry, MD  Gillermina Hu, RN Needs outpatient follow up with Carlean Purl or APP in the next 1-2 months if you can help coordinate. Thanks

## 2022-03-10 NOTE — Anesthesia Postprocedure Evaluation (Signed)
Anesthesia Post Note  Patient: Dominic Kane  Procedure(s) Performed: [place]     Patient location during evaluation: Endoscopy Anesthesia Type: MAC Level of consciousness: awake and alert Pain management: pain level controlled Vital Signs Assessment: post-procedure vital signs reviewed and stable Respiratory status: spontaneous breathing, nonlabored ventilation, respiratory function stable and patient connected to nasal cannula oxygen Cardiovascular status: stable and blood pressure returned to baseline Postop Assessment: no apparent nausea or vomiting Anesthetic complications: no   No notable events documented.  Last Vitals:  Vitals:   03/09/22 0356 03/09/22 0810  BP: 115/77 114/69  Pulse: 62 65  Resp:  16  Temp: 36.6 C 36.7 C  SpO2: 98% 95%    Last Pain:  Vitals:   03/09/22 0810  TempSrc: Oral  PainSc: 0-No pain                 Stormie Ventola S

## 2022-03-11 ENCOUNTER — Telehealth: Payer: Self-pay | Admitting: *Deleted

## 2022-03-11 NOTE — Patient Outreach (Signed)
  Care Coordination [facility] Note Transition Care Management Unsuccessful Follow-up Telephone Call  Date of discharge and from where:  [facility] [phone]  Attempts:  1st Attempt  Reason for unsuccessful TCM follow-up call:  Left voice message  [place] [facility] [phone]

## 2022-03-11 NOTE — Patient Outreach (Signed)
  Care Coordination [facility] Note Transition Care Management Follow-up Telephone Call Date of discharge and from where: [facility] 03/09/2022 How have you been since you were released from the hospital? Doing good Any questions or concerns? Yes Questions regarding the aspirin EC  Items Reviewed: Did the pt receive and understand the discharge instructions provided? Yes  Medications obtained and verified? Yes  Other? No  Any new allergies since your discharge? No  Dietary orders reviewed? No Do you have support at home? Yes   Home Care and Equipment/Supplies: Were home health services ordered? no If so, what is the name of the agency? N/a  Has the agency set up a time to come to the patient's home? not applicable Were any new equipment or medical supplies ordered?  No What is the name of the medical supply agency? N  Were you able to get the supplies/equipment? not applicable Do you have any questions related to the use of the equipment or supplies? No  Functional Questionnaire: (I = Independent and D = Dependent) ADLs: I  Bathing/Dressing- I  Meal Prep- I  Eating- I  Maintaining continence- I  Transferring/Ambulation- I  Managing Meds- I  Follow up appointments reviewed:  PCP Hospital f/u appt confirmed? Yes  Dr Eliezer Lofts [phone] 10:40 [place] [facility] f/u appt confirmed? Yes  Dr Carlean Purl [phone] 10:10. Are transportation arrangements needed? No  If their condition worsens, is the pt aware to call PCP or go to the Emergency Dept.? Yes Was the patient provided with contact information for the PCP's office or ED? Yes Was to pt encouraged to call back with questions or concerns? Yes  SDOH assessments and interventions completed:   Yes SDOH Interventions Today    Flowsheet Row Most Recent Value  SDOH Interventions   Food Insecurity Interventions Intervention Not Indicated  Housing Interventions Intervention Not Indicated  Transportation Interventions Intervention Not  Indicated       Care Coordination Interventions:  RN explained that on the hospital d/cd summary it stated to stop the aspirin EC. In the Dr discharge summary for medications it said the same and to start taking Miralax. I explained to patient and he will stop taking the asa until he sees his Dr  Gareth Morgan Outcome:  Pt. Visit Completed    [facility] [phone]

## 2022-03-26 ENCOUNTER — Ambulatory Visit (INDEPENDENT_AMBULATORY_CARE_PROVIDER_SITE_OTHER): Payer: PPO | Admitting: Family Medicine

## 2022-03-26 VITALS — BP 108/58 | HR 75 | Temp 97.9°F | Ht 70.0 in | Wt 208.2 lb

## 2022-03-26 DIAGNOSIS — E876 Hypokalemia: Secondary | ICD-10-CM | POA: Insufficient documentation

## 2022-03-26 DIAGNOSIS — K5731 Diverticulosis of large intestine without perforation or abscess with bleeding: Secondary | ICD-10-CM

## 2022-03-26 DIAGNOSIS — D62 Acute posthemorrhagic anemia: Secondary | ICD-10-CM

## 2022-03-26 DIAGNOSIS — K922 Gastrointestinal hemorrhage, unspecified: Secondary | ICD-10-CM

## 2022-03-26 LAB — CBC WITH DIFFERENTIAL/PLATELET
Basophils Absolute: 0.1 10*3/uL (ref 0.0–0.1)
Basophils Relative: 0.9 % (ref 0.0–3.0)
Eosinophils Absolute: 0.2 10*3/uL (ref 0.0–0.7)
Eosinophils Relative: 3 % (ref 0.0–5.0)
HCT: 33.2 % — ABNORMAL LOW (ref 39.0–52.0)
Hemoglobin: 10.9 g/dL — ABNORMAL LOW (ref 13.0–17.0)
Lymphocytes Relative: 19.3 % (ref 12.0–46.0)
Lymphs Abs: 1.4 10*3/uL (ref 0.7–4.0)
MCHC: 32.9 g/dL (ref 30.0–36.0)
MCV: 88.8 fl (ref 78.0–100.0)
Monocytes Absolute: 1 10*3/uL (ref 0.1–1.0)
Monocytes Relative: 13.9 % — ABNORMAL HIGH (ref 3.0–12.0)
Neutro Abs: 4.5 10*3/uL (ref 1.4–7.7)
Neutrophils Relative %: 62.9 % (ref 43.0–77.0)
Platelets: 251 10*3/uL (ref 150.0–400.0)
RBC: 3.74 Mil/uL — ABNORMAL LOW (ref 4.22–5.81)
RDW: 13.6 % (ref 11.5–15.5)
WBC: 7.1 10*3/uL (ref 4.0–10.5)

## 2022-03-26 LAB — BASIC METABOLIC PANEL
BUN: 25 mg/dL — ABNORMAL HIGH (ref 6–23)
CO2: 27 mEq/L (ref 19–32)
Calcium: 9.7 mg/dL (ref 8.4–10.5)
Chloride: 103 mEq/L (ref 96–112)
Creatinine, Ser: 1.19 mg/dL (ref 0.40–1.50)
GFR: 57.41 mL/min — ABNORMAL LOW (ref >60.00)
Glucose, Bld: 114 mg/dL — ABNORMAL HIGH (ref 70–99)
Potassium: 5.3 mEq/L — ABNORMAL HIGH (ref 3.5–5.1)
Sodium: 140 mEq/L (ref 135–145)

## 2022-03-26 LAB — IBC + FERRITIN
Ferritin: 12.8 ng/mL — ABNORMAL LOW (ref 22.0–322.0)
Iron: 43 ug/dL (ref 42–165)
Saturation Ratios: 9.4 % — ABNORMAL LOW (ref 20.0–50.0)
TIBC: 456.4 ug/dL — ABNORMAL HIGH (ref 250.0–450.0)
Transferrin: 326 mg/dL (ref 212.0–360.0)

## 2022-03-26 NOTE — Progress Notes (Signed)
Patient ID: Dominic Kane, male    DOB: Aug 25, 1940, 82 y.o.   MRN: [id number]  This visit was conducted in person.  BP (!) 108/58   Pulse 75   Temp 97.9 F (36.6 C) (Oral)   Ht '5\' 10"'$  (1.778 m)   Wt 208 lb 3.2 oz (94.4 kg)   SpO2 97%   BMI 29.87 kg/m    CC:  Chief Complaint  Patient presents with   Hospitalization Follow-up   Fatigue    Subjective:   HPI: Dominic Kane is a 82 y.o. 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 Patient was admitted March 01, 2022 until March 09, 2022 He was seen for acute lower GI bleed secondary to severe diverticulosis resulting in acute blood loss anemia.  Initial presentation was with chest pressure and shortness of breath ... Due to anemia.  Acute blood loss anemia secondary to GI bleed -GI was consulted, EGD showed no signs of recent bleeding, possible chronic GI blood loss secondary to gastric erosions -Colonoscopy on 12/12 showed significant blood clot burden in the entire large colon. -CT angio showed no definitive evidence of active GI bleeding -underwent capsule endoscopy on 12/13, showed red spot in distal small bowel - underwent flex sig on 12/15 with diverticulosis, showed clotted maroon blood with areas of red blood in the sigmoid colon.  -Patient received total 5 units of packed RBC transfusion -Underwent colonoscopy again on 12/16, showed severe diverticulosis in the entire examined colon, worse in the left colon, internal hemorrhoids.  No active bleeding.  Suspected patient had a slow/stuttering left-sided diverticular bleed, has since stopped -H&H remained stable for over 48 hours, hemoglobin 9.4 at discharge. MiraLAX recommended twice daily to avoid  constipation.  He was set up with follow up with Dr. Havery Moros or GI partner, 2 weeks from discharge.  Today he reports now daily BM regular consistency, no melana , no brbpr.  He has some SOB and still very fatigued and cold.  No abdominal pain.  No fever.   DM, blood sugar control improved since being home... FBS tis AM 102  Nml po intake. Relevant past medical, surgical, family and social history reviewed and updated as indicated. Interim medical history since our last visit reviewed. Allergies and medications reviewed and updated. Outpatient Medications Prior to Visit  Medication Sig Dispense Refill   acetaminophen (TYLENOL) 500 MG tablet Take 1,000 mg by mouth as needed for moderate pain.     cetirizine (ZYRTEC) 10 MG tablet Take 10 mg by mouth as needed for allergies.     Chlorpheniramine-DM (CORICIDIN HBP COUGH/COLD PO) Take 1 tablet by mouth daily as needed (congestion).     Cholecalciferol (VITAMIN D-3 PO) Take 1 capsule by mouth at bedtime.     finasteride (PROSCAR) 5 MG tablet Take 1 tablet (5 mg total) by mouth daily. 90 tablet 3   furosemide (LASIX) 40 MG tablet TAKE 1 TABLET BY MOUTH  DAILY (DUE FOR AN APPT IN  APRIL AND CAN GET YEARLY  SUPPLY AT THAT VISIT.   PLEASE CALL TO SCHEDULE) (Patient taking differently: Take 40 mg by mouth daily.) 90 tablet 3   levothyroxine (SYNTHROID) 150 MCG tablet TAKE 1 TABLET BY MOUTH EVERY DAY BEFORE BREAKFAST (Patient taking differently: Take 150 mcg by mouth daily  before breakfast.) 90 tablet 3   Melatonin 5 MG CAPS Take 5 mg by mouth at bedtime.     Multiple Vitamins-Minerals (CENTRUM SILVER 50+MEN PO) Take 1 tablet by mouth at bedtime.     Multiple Vitamins-Minerals (PRESERVISION AREDS 2 PO) Take 1 tablet by mouth in the morning and at bedtime.     omeprazole (PRILOSEC) 20 MG capsule Take 1 capsule (20 mg total) by mouth daily. 90 capsule 3   polyethylene glycol (MIRALAX / GLYCOLAX) 17 g packet Take 17 g by mouth 2 (two) times daily. For  constipation. Also available OTC. 60 each 3   PRESCRIPTION MEDICATION every 6 (six) months. *Antibiotic Injection in Eye*     ramipril (ALTACE) 2.5 MG capsule TAKE 1 CAPSULE BY MOUTH EVERY DAY (Patient taking differently: Take 2.5 mg by mouth daily.) 90 capsule 0   rosuvastatin (CRESTOR) 10 MG tablet Take 1 tablet (10 mg total) by mouth daily. 90 tablet 1   terazosin (HYTRIN) 10 MG capsule Take 1 capsule (10 mg total) by mouth daily. (Patient taking differently: Take 10 mg by mouth at bedtime.) 90 capsule 3   No facility-administered medications prior to visit.     Per HPI unless specifically indicated in ROS section below Review of Systems  Constitutional:  Positive for fatigue. Negative for fever.  HENT:  Negative for ear pain.   Eyes:  Negative for pain.  Respiratory:  Positive for shortness of breath. Negative for cough.   Cardiovascular:  Negative for chest pain, palpitations and leg swelling.  Gastrointestinal:  Negative for abdominal pain.  Genitourinary:  Negative for dysuria.  Musculoskeletal:  Negative for arthralgias.  Neurological:  Negative for syncope, light-headedness and headaches.  Psychiatric/Behavioral:  Negative for dysphoric mood.    Objective:  BP (!) 108/58   Pulse 75   Temp 97.9 F (36.6 C) (Oral)   Ht '5\' 10"'$  (1.778 m)   Wt 208 lb 3.2 oz (94.4 kg)   SpO2 97%   BMI 29.87 kg/m   Wt Readings from Last 3 Encounters:  03/26/22 208 lb 3.2 oz (94.4 kg)  03/09/22 202 lb 9.6 oz (91.9 kg)  10/29/21 206 lb 2 oz (93.5 kg)      Physical Exam Constitutional:      Appearance: He is well-developed.  HENT:     Head: Normocephalic.     Right Ear: Hearing normal.     Left Ear: Hearing normal.     Nose: Nose normal.  Neck:     Thyroid: No thyroid mass or thyromegaly.     Vascular: No carotid bruit.     Trachea: Trachea normal.  Cardiovascular:     Rate and Rhythm: Normal rate and regular rhythm.     Pulses: Normal pulses.     Heart sounds: Heart sounds not  distant. No murmur heard.    No friction rub. No gallop.     Comments: No peripheral edema Pulmonary:     Effort: Pulmonary effort is normal. No respiratory distress.     Breath sounds: Normal breath sounds.  Skin:    General: Skin is warm and dry.     Findings: No rash.  Psychiatric:        Speech: Speech normal.        Behavior: Behavior normal.        Thought Content: Thought content normal.       Results for orders placed or performed during the hospital encounter of 03/01/22  MRSA Next Gen by PCR, Nasal  Specimen: Nasal Mucosa; Nasal Swab  Result Value Ref Range   MRSA by PCR Next Gen NOT DETECTED NOT DETECTED  Basic metabolic panel  Result Value Ref Range   Sodium 137 135 - 145 mmol/L   Potassium 4.2 3.5 - 5.1 mmol/L   Chloride 106 98 - 111 mmol/L   CO2 21 (L) 22 - 32 mmol/L   Glucose, Bld 156 (H) 70 - 99 mg/dL   BUN 30 (H) 8 - 23 mg/dL   Creatinine, Ser 1.27 (H) 0.61 - 1.24 mg/dL   Calcium 9.1 8.9 - 10.3 mg/dL   GFR, Estimated 57 (L) >60 mL/min   Anion gap 10 5 - 15  CBC  Result Value Ref Range   WBC 8.3 4.0 - 10.5 K/uL   RBC 3.25 (L) 4.22 - 5.81 MIL/uL   Hemoglobin 9.8 (L) 13.0 - 17.0 g/dL   HCT 30.6 (L) 39.0 - 52.0 %   MCV 94.2 80.0 - 100.0 fL   MCH 30.2 26.0 - 34.0 pg   MCHC 32.0 30.0 - 36.0 g/dL   RDW 13.3 11.5 - 15.5 %   Platelets 189 150 - 400 K/uL   nRBC 0.0 0.0 - 0.2 %  Hepatic function panel  Result Value Ref Range   Total Protein 6.2 (L) 6.5 - 8.1 g/dL   Albumin 3.7 3.5 - 5.0 g/dL   AST 21 15 - 41 U/L   ALT 15 0 - 44 U/L   Alkaline Phosphatase 68 38 - 126 U/L   Total Bilirubin 0.8 0.3 - 1.2 mg/dL   Bilirubin, Direct <0.1 0.0 - 0.2 mg/dL   Indirect Bilirubin NOT CALCULATED 0.3 - 0.9 mg/dL  Hemoglobin and hematocrit, blood  Result Value Ref Range   Hemoglobin 9.9 (L) 13.0 - 17.0 g/dL   HCT 28.9 (L) 39.0 - 52.0 %  Hemoglobin and hematocrit, blood  Result Value Ref Range   Hemoglobin 9.0 (L) 13.0 - 17.0 g/dL   HCT 26.6 (L) 39.0 - 52.0 %   Brain natriuretic peptide  Result Value Ref Range   B Natriuretic Peptide 149.7 (H) 0.0 - 100.0 pg/mL  TSH  Result Value Ref Range   TSH 0.707 0.350 - 4.500 uIU/mL  CBC  Result Value Ref Range   WBC 8.5 4.0 - 10.5 K/uL   RBC 2.96 (L) 4.22 - 5.81 MIL/uL   Hemoglobin 8.9 (L) 13.0 - 17.0 g/dL   HCT 27.5 (L) 39.0 - 52.0 %   MCV 92.9 80.0 - 100.0 fL   MCH 30.1 26.0 - 34.0 pg   MCHC 32.4 30.0 - 36.0 g/dL   RDW 13.4 11.5 - 15.5 %   Platelets 165 150 - 400 K/uL   nRBC 0.0 0.0 - 0.2 %  Basic metabolic panel  Result Value Ref Range   Sodium 138 135 - 145 mmol/L   Potassium 4.2 3.5 - 5.1 mmol/L   Chloride 108 98 - 111 mmol/L   CO2 20 (L) 22 - 32 mmol/L   Glucose, Bld 99 70 - 99 mg/dL   BUN 28 (H) 8 - 23 mg/dL   Creatinine, Ser 1.13 0.61 - 1.24 mg/dL   Calcium 8.7 (L) 8.9 - 10.3 mg/dL   GFR, Estimated >60 >60 mL/min   Anion gap 10 5 - 15  Hemoglobin and hematocrit, blood  Result Value Ref Range   Hemoglobin 9.4 (L) 13.0 - 17.0 g/dL   HCT 27.8 (L) 39.0 - 52.0 %  Glucose, capillary  Result Value Ref Range   Glucose-Capillary  124 (H) 70 - 99 mg/dL  Glucose, capillary  Result Value Ref Range   Glucose-Capillary 118 (H) 70 - 99 mg/dL  CBC  Result Value Ref Range   WBC 9.7 4.0 - 10.5 K/uL   RBC 2.82 (L) 4.22 - 5.81 MIL/uL   Hemoglobin 8.9 (L) 13.0 - 17.0 g/dL   HCT 25.9 (L) 39.0 - 52.0 %   MCV 91.8 80.0 - 100.0 fL   MCH 31.6 26.0 - 34.0 pg   MCHC 34.4 30.0 - 36.0 g/dL   RDW 13.4 11.5 - 15.5 %   Platelets 174 150 - 400 K/uL   nRBC 0.0 0.0 - 0.2 %  Basic metabolic panel  Result Value Ref Range   Sodium 138 135 - 145 mmol/L   Potassium 3.8 3.5 - 5.1 mmol/L   Chloride 108 98 - 111 mmol/L   CO2 20 (L) 22 - 32 mmol/L   Glucose, Bld 108 (H) 70 - 99 mg/dL   BUN 26 (H) 8 - 23 mg/dL   Creatinine, Ser 0.91 0.61 - 1.24 mg/dL   Calcium 8.6 (L) 8.9 - 10.3 mg/dL   GFR, Estimated >60 >60 mL/min   Anion gap 10 5 - 15  Iron and TIBC  Result Value Ref Range   Iron 40 (L) 45 - 182 ug/dL    TIBC 315 250 - 450 ug/dL   Saturation Ratios 13 (L) 17.9 - 39.5 %   UIBC 275 ug/dL  Ferritin  Result Value Ref Range   Ferritin 23 (L) 24 - 336 ng/mL  Glucose, capillary  Result Value Ref Range   Glucose-Capillary 126 (H) 70 - 99 mg/dL  Glucose, capillary  Result Value Ref Range   Glucose-Capillary 117 (H) 70 - 99 mg/dL  Glucose, capillary  Result Value Ref Range   Glucose-Capillary 118 (H) 70 - 99 mg/dL  Glucose, capillary  Result Value Ref Range   Glucose-Capillary 134 (H) 70 - 99 mg/dL  Glucose, capillary  Result Value Ref Range   Glucose-Capillary 146 (H) 70 - 99 mg/dL  CBC  Result Value Ref Range   WBC 7.4 4.0 - 10.5 K/uL   RBC 2.59 (L) 4.22 - 5.81 MIL/uL   Hemoglobin 8.1 (L) 13.0 - 17.0 g/dL   HCT 23.8 (L) 39.0 - 52.0 %   MCV 91.9 80.0 - 100.0 fL   MCH 31.3 26.0 - 34.0 pg   MCHC 34.0 30.0 - 36.0 g/dL   RDW 13.7 11.5 - 15.5 %   Platelets 159 150 - 400 K/uL   nRBC 0.0 0.0 - 0.2 %  Basic metabolic panel  Result Value Ref Range   Sodium 143 135 - 145 mmol/L   Potassium 3.3 (L) 3.5 - 5.1 mmol/L   Chloride 111 98 - 111 mmol/L   CO2 22 22 - 32 mmol/L   Glucose, Bld 114 (H) 70 - 99 mg/dL   BUN 17 8 - 23 mg/dL   Creatinine, Ser 0.85 0.61 - 1.24 mg/dL   Calcium 8.5 (L) 8.9 - 10.3 mg/dL   GFR, Estimated >60 >60 mL/min   Anion gap 10 5 - 15  Hemoglobin and hematocrit, blood  Result Value Ref Range   Hemoglobin 8.2 (L) 13.0 - 17.0 g/dL   HCT 23.9 (L) 39.0 - 52.0 %  Glucose, capillary  Result Value Ref Range   Glucose-Capillary 78 70 - 99 mg/dL  Glucose, capillary  Result Value Ref Range   Glucose-Capillary 101 (H) 70 - 99 mg/dL  Glucose, capillary  Result Value  Ref Range   Glucose-Capillary 106 (H) 70 - 99 mg/dL  Glucose, capillary  Result Value Ref Range   Glucose-Capillary 112 (H) 70 - 99 mg/dL  Hemoglobin and hematocrit, blood  Result Value Ref Range   Hemoglobin 7.8 (L) 13.0 - 17.0 g/dL   HCT 23.8 (L) 39.0 - 52.0 %  Hemoglobin and hematocrit, blood   Result Value Ref Range   Hemoglobin 8.4 (L) 13.0 - 17.0 g/dL   HCT 26.0 (L) 39.0 - 52.0 %  Glucose, capillary  Result Value Ref Range   Glucose-Capillary 117 (H) 70 - 99 mg/dL  CBC  Result Value Ref Range   WBC 7.1 4.0 - 10.5 K/uL   RBC 2.44 (L) 4.22 - 5.81 MIL/uL   Hemoglobin 7.4 (L) 13.0 - 17.0 g/dL   HCT 22.8 (L) 39.0 - 52.0 %   MCV 93.4 80.0 - 100.0 fL   MCH 30.3 26.0 - 34.0 pg   MCHC 32.5 30.0 - 36.0 g/dL   RDW 13.8 11.5 - 15.5 %   Platelets 142 (L) 150 - 400 K/uL   nRBC 0.0 0.0 - 0.2 %  Basic metabolic panel  Result Value Ref Range   Sodium 140 135 - 145 mmol/L   Potassium 3.7 3.5 - 5.1 mmol/L   Chloride 110 98 - 111 mmol/L   CO2 23 22 - 32 mmol/L   Glucose, Bld 99 70 - 99 mg/dL   BUN 11 8 - 23 mg/dL   Creatinine, Ser 0.83 0.61 - 1.24 mg/dL   Calcium 8.3 (L) 8.9 - 10.3 mg/dL   GFR, Estimated >60 >60 mL/min   Anion gap 7 5 - 15  Glucose, capillary  Result Value Ref Range   Glucose-Capillary 110 (H) 70 - 99 mg/dL  Glucose, capillary  Result Value Ref Range   Glucose-Capillary 89 70 - 99 mg/dL  Glucose, capillary  Result Value Ref Range   Glucose-Capillary 95 70 - 99 mg/dL  Glucose, capillary  Result Value Ref Range   Glucose-Capillary 109 (H) 70 - 99 mg/dL   Comment 1 Notify RN    Comment 2 Document in Chart   CBC  Result Value Ref Range   WBC 7.9 4.0 - 10.5 K/uL   RBC 2.84 (L) 4.22 - 5.81 MIL/uL   Hemoglobin 8.8 (L) 13.0 - 17.0 g/dL   HCT 25.4 (L) 39.0 - 52.0 %   MCV 89.4 80.0 - 100.0 fL   MCH 31.0 26.0 - 34.0 pg   MCHC 34.6 30.0 - 36.0 g/dL   RDW 14.7 11.5 - 15.5 %   Platelets 139 (L) 150 - 400 K/uL   nRBC 0.0 0.0 - 0.2 %  Hemoglobin and hematocrit, blood  Result Value Ref Range   Hemoglobin 9.8 (L) 13.0 - 17.0 g/dL   HCT 28.8 (L) 39.0 - 52.0 %  Glucose, capillary  Result Value Ref Range   Glucose-Capillary 97 70 - 99 mg/dL  Glucose, capillary  Result Value Ref Range   Glucose-Capillary 85 70 - 99 mg/dL  Glucose, capillary  Result Value Ref  Range   Glucose-Capillary 119 (H) 70 - 99 mg/dL  Glucose, capillary  Result Value Ref Range   Glucose-Capillary 119 (H) 70 - 99 mg/dL  Glucose, capillary  Result Value Ref Range   Glucose-Capillary 124 (H) 70 - 99 mg/dL  Glucose, capillary  Result Value Ref Range   Glucose-Capillary 139 (H) 70 - 99 mg/dL  Hemoglobin and hematocrit, blood  Result Value Ref Range   Hemoglobin 10.1 (L)  13.0 - 17.0 g/dL   HCT 30.9 (L) 39.0 - 52.0 %  Glucose, capillary  Result Value Ref Range   Glucose-Capillary 95 70 - 99 mg/dL  Glucose, capillary  Result Value Ref Range   Glucose-Capillary 98 70 - 99 mg/dL  Hemoglobin and hematocrit, blood  Result Value Ref Range   Hemoglobin 9.7 (L) 13.0 - 17.0 g/dL   HCT 29.4 (L) 39.0 - 52.0 %  Hemoglobin and hematocrit, blood  Result Value Ref Range   Hemoglobin 9.3 (L) 13.0 - 17.0 g/dL   HCT 27.8 (L) 39.0 - 52.0 %  Hemoglobin and hematocrit, blood  Result Value Ref Range   Hemoglobin 9.9 (L) 13.0 - 17.0 g/dL   HCT 29.7 (L) 39.0 - 60.1 %  Basic metabolic panel  Result Value Ref Range   Sodium 142 135 - 145 mmol/L   Potassium 4.3 3.5 - 5.1 mmol/L   Chloride 107 98 - 111 mmol/L   CO2 27 22 - 32 mmol/L   Glucose, Bld 100 (H) 70 - 99 mg/dL   BUN 8 8 - 23 mg/dL   Creatinine, Ser 0.91 0.61 - 1.24 mg/dL   Calcium 8.7 (L) 8.9 - 10.3 mg/dL   GFR, Estimated >60 >60 mL/min   Anion gap 8 5 - 15  Glucose, capillary  Result Value Ref Range   Glucose-Capillary 104 (H) 70 - 99 mg/dL   Comment 1 Notify RN   Hemoglobin and hematocrit, blood  Result Value Ref Range   Hemoglobin 10.1 (L) 13.0 - 17.0 g/dL   HCT 29.8 (L) 39.0 - 52.0 %  Glucose, capillary  Result Value Ref Range   Glucose-Capillary 94 70 - 99 mg/dL  Hemoglobin and hematocrit, blood  Result Value Ref Range   Hemoglobin 10.1 (L) 13.0 - 17.0 g/dL   HCT 30.1 (L) 39.0 - 52.0 %  Hemoglobin and hematocrit, blood  Result Value Ref Range   Hemoglobin 9.9 (L) 13.0 - 17.0 g/dL   HCT 29.8 (L) 39.0 - 52.0  %  Glucose, capillary  Result Value Ref Range   Glucose-Capillary 103 (H) 70 - 99 mg/dL  Glucose, capillary  Result Value Ref Range   Glucose-Capillary 94 70 - 99 mg/dL  Hemoglobin and hematocrit, blood  Result Value Ref Range   Hemoglobin 9.3 (L) 13.0 - 17.0 g/dL   HCT 27.7 (L) 39.0 - 52.0 %  Glucose, capillary  Result Value Ref Range   Glucose-Capillary 89 70 - 99 mg/dL  Glucose, capillary  Result Value Ref Range   Glucose-Capillary 136 (H) 70 - 99 mg/dL  Hemoglobin and hematocrit, blood  Result Value Ref Range   Hemoglobin 9.4 (L) 13.0 - 17.0 g/dL   HCT 28.2 (L) 39.0 - 52.0 %  Glucose, capillary  Result Value Ref Range   Glucose-Capillary 120 (H) 70 - 99 mg/dL  Glucose, capillary  Result Value Ref Range   Glucose-Capillary 85 70 - 99 mg/dL  POC occult blood, ED  Result Value Ref Range   Fecal Occult Bld POSITIVE (A) NEGATIVE  Prepare fresh frozen plasma  Result Value Ref Range   Unit Number [id number]    Blood Component Type THW PLS APHR    Unit division B0    Status of Unit ISSUED,FINAL    Transfusion Status      OK TO TRANSFUSE Performed at [facility], [address], [place], [place] [address]   Type and screen [place]  Result Value Ref Range   ABO/RH(D) Jenetta Downer  POS    Antibody Screen NEG    Sample Expiration 03/04/2022,2359    Unit Number [id number]    Blood Component Type RED CELLS,LR    Unit division 00    Status of Unit ISSUED,FINAL    Transfusion Status OK TO TRANSFUSE    Crossmatch Result      Compatible Performed at [facility], [place] [address] [place], [place] [address]   Prepare RBC (crossmatch)  Result Value Ref Range   Order Confirmation      ORDER PROCESSED BY BLOOD BANK Performed at [facility], [place] [address], [place], [place] [address]   Prepare RBC (crossmatch)  Result Value Ref Range   Order Confirmation      ORDER PROCESSED BY BLOOD BANK Performed at [facility], [place] [address], [place], [place] [address]   Type and screen [place]  Result Value Ref Range   ABO/RH(D) O POS    Antibody Screen NEG    Sample Expiration 03/08/2022,2359    Unit Number [id number]    Blood Component Type RED CELLS,LR    Unit division 00    Status of Unit ISSUED,FINAL    Transfusion Status OK TO TRANSFUSE    Crossmatch Result      Compatible Performed at [facility], [place] [address], [place], [place] [address]    Unit Number [id number]    Blood Component Type RBC LR PHER1    Unit division 00    Status of Unit ISSUED,FINAL    Transfusion Status OK TO TRANSFUSE    Crossmatch Result Compatible   BPAM FFP  Result Value Ref Range   ISSUE DATE / TIME 761607371062    Blood Product Unit Number [id number]    PRODUCT CODE [id number]    Unit Type and Rh 5100    Blood Product Expiration Date 202312102359   BPAM RBC  Result Value Ref Range   ISSUE DATE / TIME 093818299371    Blood Product Unit Number [id number]    PRODUCT CODE [id number]    Unit Type and Rh 5100    Blood Product Expiration Date 782423536144   BPAM RBC  Result Value Ref Range   ISSUE DATE / TIME 315400867619    Blood Product Unit Number [id number]    PRODUCT CODE [id number]    Unit Type and Rh 5100    Blood Product Expiration Date 202312202359    ISSUE DATE / TIME [id number]    Blood Product Unit Number [id number]    PRODUCT CODE [id number]    Unit Type and Rh 5100    Blood Product Expiration Date 196222979892   Troponin I (High Sensitivity)  Result Value Ref Range   Troponin I (High Sensitivity) 7 <18 ng/L  Troponin I (High Sensitivity)  Result Value Ref Range   Troponin I (High Sensitivity) 7 <18 ng/L     COVID 19 screen:  No recent travel or known exposure to COVID19 The patient denies respiratory symptoms of COVID 19 at this time. The importance of social distancing was discussed today.   Assessment and Plan Problem List Items Addressed This Visit     Acute  blood loss anemia    Acute, no further blood loss.  Will reevaluate hemoglobin today given continued fatigue.  Unable to take iron given constipation.  If he remains significantly anemic or if worsening will recommend referral to hematology for possible iron infusion.      Relevant Orders   CBC  with Differential/Platelet   IBC + Ferritin   Diverticulosis of colon with hemorrhage    No further GI bleeding.  Patient controlling constipation with MiraLAX twice daily. Will continue working on high-fiber diet and increase water.  He will follow-up with GI as scheduled in the next few weeks.       Hypokalemia    Due for reevaluation.  Normal p.o. intake now.      Relevant Orders   Basic Metabolic Panel   Lower GI bleed - Primary   Orders Placed This Encounter  Procedures   CBC with Differential/Platelet   Basic Metabolic Panel   IBC + Ferritin       Eliezer Lofts, MD

## 2022-03-26 NOTE — Assessment & Plan Note (Signed)
Acute, no further blood loss.  Will reevaluate hemoglobin today given continued fatigue.  Unable to take iron given constipation.  If he remains significantly anemic or if worsening will recommend referral to hematology for possible iron infusion.

## 2022-03-26 NOTE — Assessment & Plan Note (Signed)
Due for reevaluation.  Normal p.o. intake now.

## 2022-03-26 NOTE — Assessment & Plan Note (Signed)
No further GI bleeding.  Patient controlling constipation with MiraLAX twice daily. Will continue working on high-fiber diet and increase water.  He will follow-up with GI as scheduled in the next few weeks.

## 2022-03-28 ENCOUNTER — Telehealth: Payer: Self-pay | Admitting: *Deleted

## 2022-03-28 DIAGNOSIS — E875 Hyperkalemia: Secondary | ICD-10-CM

## 2022-03-28 NOTE — Telephone Encounter (Signed)
Left message to return call to our office.  

## 2022-03-28 NOTE — Telephone Encounter (Signed)
PLEASE NOTE: All timestamps contained within this report are represented as [place] Standard Time. CONFIDENTIALTY NOTICE: This fax transmission is intended only for the addressee. It contains information that is legally privileged, confidential or otherwise protected from use or disclosure. If you are not the intended recipient, you are strictly prohibited from reviewing, disclosing, copying using or disseminating any of this information or taking any action in reliance on or regarding this information. If you have received this fax in error, please notify us immediately by telephone so that we can arrange for its return to [place]. Phone: [phone], Toll-Free: [phone], Fax: [phone] Page: 1 of 1 Call Id: [id number] [place] Night - Client Nonclinical Telephone Record  [facility] Client [place] Night - Client Client Site [place] Provider Eliezer Lofts - MD Contact Type Call Who Is Calling Patient / Member / Family / Caregiver Caller Name Buell Parcel Caller Phone Number [phone] Call Type Message Only Information Provided Reason for Call Returning a Call from the Office Initial [place] states he is returning a call to the office. Additional Comment Office hours provided. Disp. Time Disposition Final User 03/27/2022 5:22:22 PM General Information Provided Yes Florina Ou Call Closed By: Florina Ou Transaction Date/Time: 03/27/2022 5:20:36 PM (ET)

## 2022-03-31 ENCOUNTER — Encounter (INDEPENDENT_AMBULATORY_CARE_PROVIDER_SITE_OTHER): Payer: PPO | Admitting: Ophthalmology

## 2022-03-31 DIAGNOSIS — H353132 Nonexudative age-related macular degeneration, bilateral, intermediate dry stage: Secondary | ICD-10-CM

## 2022-03-31 DIAGNOSIS — H35033 Hypertensive retinopathy, bilateral: Secondary | ICD-10-CM

## 2022-03-31 DIAGNOSIS — H33303 Unspecified retinal break, bilateral: Secondary | ICD-10-CM | POA: Diagnosis not present

## 2022-03-31 DIAGNOSIS — H43813 Vitreous degeneration, bilateral: Secondary | ICD-10-CM | POA: Diagnosis not present

## 2022-03-31 DIAGNOSIS — H35372 Puckering of macula, left eye: Secondary | ICD-10-CM | POA: Diagnosis not present

## 2022-03-31 DIAGNOSIS — H34811 Central retinal vein occlusion, right eye, with macular edema: Secondary | ICD-10-CM | POA: Diagnosis not present

## 2022-03-31 DIAGNOSIS — I10 Essential (primary) hypertension: Secondary | ICD-10-CM

## 2022-03-31 NOTE — Telephone Encounter (Signed)
See lab results. Message left for patient to fall the office back.

## 2022-03-31 NOTE — Telephone Encounter (Signed)
Pt called back returning Donna's missed call. I told the pt [facility]'s response. Pt asked how long would it take his iron to return to normal if he doesn't see a hematologist? Call back # [phone]

## 2022-03-31 NOTE — Telephone Encounter (Signed)
Spoke with Dominic Kane and discussed lab results.  He states he is not taking any potassium supplements.  He does take a multivitamin but his wife checked and states it does not contain any potassium in it.  They do eat a banana usually daily.  He states he takes the furosemide about once or twice a week.  He is also asking how long it will take to for his iron to return to normal if he doesn't see a hemologist? While on the phone he states he has been having a lingering dry cough x 2 months which is worse at night.  He is asking if something can be prescribed to help with the cough.  Please advise.

## 2022-04-01 ENCOUNTER — Encounter: Payer: Self-pay | Admitting: *Deleted

## 2022-04-01 MED ORDER — BENZONATATE 200 MG PO CAPS: 200.0000 mg | ORAL_CAPSULE | Freq: Two times a day (BID) | ORAL | 0 refills | Status: DC | PRN

## 2022-04-01 NOTE — Addendum Note (Signed)
Addended by: Carter Kitten on: 04/01/2022 09:59 AM   Modules accepted: Orders

## 2022-04-01 NOTE — Telephone Encounter (Signed)
Stop bananas,  increase water.  Return for recheck potassium  on Friday.  3 months for anemia to return to normal, but already improving... I would suggest high iron diet.   No other symptoms with cough that need to be triaged? SOB, wheeze? If no other symptoms send in benzonatate 200 mg po BID  #20 ) refills.  Give ER precautions.. if not improving needs OV.

## 2022-04-01 NOTE — Telephone Encounter (Signed)
Mr. Craine notified as instructed by telephone.  Lab appointment scheduled for 04/04/22 at 7:45 am.  Patient denies any other symptoms other than some mild SOB but he states that this due to when he coughs.   ER precautions given.  Benzonatate 200 mg sent to [facility] in [place].  All this information was also sent to patient's MyChart per request.

## 2022-04-01 NOTE — Addendum Note (Signed)
Addended by: Eliezer Lofts E on: 04/01/2022 09:08 AM   Modules accepted: Orders

## 2022-04-04 ENCOUNTER — Other Ambulatory Visit (INDEPENDENT_AMBULATORY_CARE_PROVIDER_SITE_OTHER): Payer: PPO

## 2022-04-04 DIAGNOSIS — E875 Hyperkalemia: Secondary | ICD-10-CM

## 2022-04-04 LAB — POTASSIUM: Potassium: 4.3 mEq/L (ref 3.5–5.1)

## 2022-04-09 ENCOUNTER — Other Ambulatory Visit: Payer: Self-pay

## 2022-04-09 MED ORDER — RAMIPRIL 2.5 MG PO CAPS: ORAL_CAPSULE | ORAL | 0 refills | Status: DC

## 2022-04-10 DIAGNOSIS — C73 Malignant neoplasm of thyroid gland: Secondary | ICD-10-CM | POA: Diagnosis not present

## 2022-04-10 DIAGNOSIS — E1165 Type 2 diabetes mellitus with hyperglycemia: Secondary | ICD-10-CM | POA: Diagnosis not present

## 2022-04-10 DIAGNOSIS — E7800 Pure hypercholesterolemia, unspecified: Secondary | ICD-10-CM | POA: Diagnosis not present

## 2022-04-10 DIAGNOSIS — E890 Postprocedural hypothyroidism: Secondary | ICD-10-CM | POA: Diagnosis not present

## 2022-04-17 DIAGNOSIS — E1165 Type 2 diabetes mellitus with hyperglycemia: Secondary | ICD-10-CM | POA: Diagnosis not present

## 2022-04-17 DIAGNOSIS — I10 Essential (primary) hypertension: Secondary | ICD-10-CM | POA: Diagnosis not present

## 2022-04-17 DIAGNOSIS — E7800 Pure hypercholesterolemia, unspecified: Secondary | ICD-10-CM | POA: Diagnosis not present

## 2022-04-17 DIAGNOSIS — C73 Malignant neoplasm of thyroid gland: Secondary | ICD-10-CM | POA: Diagnosis not present

## 2022-04-17 DIAGNOSIS — I5042 Chronic combined systolic (congestive) and diastolic (congestive) heart failure: Secondary | ICD-10-CM | POA: Diagnosis not present

## 2022-04-17 DIAGNOSIS — E890 Postprocedural hypothyroidism: Secondary | ICD-10-CM | POA: Diagnosis not present

## 2022-04-17 DIAGNOSIS — I2510 Atherosclerotic heart disease of native coronary artery without angina pectoris: Secondary | ICD-10-CM | POA: Diagnosis not present

## 2022-04-22 ENCOUNTER — Ambulatory Visit (INDEPENDENT_AMBULATORY_CARE_PROVIDER_SITE_OTHER): Payer: PPO | Admitting: Internal Medicine

## 2022-04-22 ENCOUNTER — Other Ambulatory Visit (INDEPENDENT_AMBULATORY_CARE_PROVIDER_SITE_OTHER): Payer: PPO

## 2022-04-22 ENCOUNTER — Encounter: Payer: Self-pay | Admitting: Internal Medicine

## 2022-04-22 VITALS — BP 112/60 | HR 79 | Ht 72.0 in | Wt 214.0 lb

## 2022-04-22 DIAGNOSIS — K5731 Diverticulosis of large intestine without perforation or abscess with bleeding: Secondary | ICD-10-CM

## 2022-04-22 DIAGNOSIS — D509 Iron deficiency anemia, unspecified: Secondary | ICD-10-CM

## 2022-04-22 DIAGNOSIS — K5909 Other constipation: Secondary | ICD-10-CM | POA: Diagnosis not present

## 2022-04-22 DIAGNOSIS — K635 Polyp of colon: Secondary | ICD-10-CM | POA: Diagnosis not present

## 2022-04-22 LAB — CBC WITH DIFFERENTIAL/PLATELET
Basophils Absolute: 0 10*3/uL (ref 0.0–0.1)
Basophils Relative: 0.5 % (ref 0.0–3.0)
Eosinophils Absolute: 0.2 10*3/uL (ref 0.0–0.7)
Eosinophils Relative: 1.8 % (ref 0.0–5.0)
HCT: 35.3 % — ABNORMAL LOW (ref 39.0–52.0)
Hemoglobin: 11.8 g/dL — ABNORMAL LOW (ref 13.0–17.0)
Lymphocytes Relative: 18.1 % (ref 12.0–46.0)
Lymphs Abs: 1.8 10*3/uL (ref 0.7–4.0)
MCHC: 33.5 g/dL (ref 30.0–36.0)
MCV: 86 fl (ref 78.0–100.0)
Monocytes Absolute: 1.1 10*3/uL — ABNORMAL HIGH (ref 0.1–1.0)
Monocytes Relative: 11.5 % (ref 3.0–12.0)
Neutro Abs: 6.6 10*3/uL (ref 1.4–7.7)
Neutrophils Relative %: 68.1 % (ref 43.0–77.0)
Platelets: 227 10*3/uL (ref 150.0–400.0)
RBC: 4.1 Mil/uL — ABNORMAL LOW (ref 4.22–5.81)
RDW: 14.1 % (ref 11.5–15.5)
WBC: 9.8 10*3/uL (ref 4.0–10.5)

## 2022-04-22 MED ORDER — FERROUS SULFATE 325 (65 FE) MG PO TBEC: DELAYED_RELEASE_TABLET | ORAL | 3 refills | Status: AC

## 2022-04-22 NOTE — Patient Instructions (Signed)
Your provider has requested that you go to the basement level for lab work before leaving today. Press "B" on the elevator. The lab is located at the first door on the left as you exit the elevator.  We have sent the following medications to your pharmacy for you to pick up at your convenience: ferrous sulfate to take every other day.  You have been given a handout on diverticulosis/diverticulitis.  You can resume your 81 mg baby aspirin and your resume your normal diet.   The [place] [facility] providers would like to encourage you to use [facility] to communicate with providers for non-urgent requests or questions.  Due to long hold times on the telephone, sending your provider a message by [facility] may be a faster and more efficient way to get a response.  Please allow 48 business hours for a response.  Please remember that this is for non-urgent requests.    Due to recent changes in healthcare laws, you may see the results of your imaging and laboratory studies on MyChart before your provider has had a chance to review them.  We understand that in some cases there may be results that are confusing or concerning to you. Not all laboratory results come back in the same time frame and the provider may be waiting for multiple results in order to interpret others.  Please give [place] 48 hours in order for your provider to thoroughly review all the results before contacting the office for clarification of your results.   Thank you for choosing me and [facility].  Gatha Mayer, M.D., [facility]

## 2022-04-22 NOTE — Progress Notes (Signed)
Dominic Kane 82 y.o. [birth year] [id number]  Assessment & Plan:   Encounter Diagnoses  Name Primary?   Iron deficiency anemia, unspecified iron deficiency anemia type Yes   Chronic constipation    Diverticulosis of colon with hemorrhage    Polyps of ascending colon, unspecified type     He appears to be recovered from his diverticular hemorrhage which was severe.  He may liberalize his diet.  He is choosing not to eat seeds or nuts.  They do not need to be restricted based upon my understanding.  Diverticulosis and diverticulitis handout presented to the patient.  I also reviewed some cartoon images of diverticular hemorrhage explaining that in more detail.  Follow-up CBC again today and determine timing of next labs.  I am having him start ferrous sulfate 325 mg OTC every other day.  I think he needs to try to replete it.  This may prove difficult because of his constipation issues but he knows and plans to increase MiraLAX if required.  Once I see the CBC from today I will determine timing for a follow-up CBC and ferritin test.  He has decided not to pursue a colonoscopy to remove the diminutive polyp seen in the right colon.  He understands the rare but real risk of cancer developing from these polyps but his age and with his comorbidities he has chosen to defer this for signs and symptoms that warrant.  He may restart 81 mg aspirin daily as a protective agent with his coronary artery disease.  I appreciate the opportunity to care for this patient. CC: Jinny Sanders, MD  Lab Results  Component Value Date   WBC 9.8 04/22/2022   HGB 11.8 (L) 04/22/2022   HCT 35.3 (L) 04/22/2022   MCV 86.0 04/22/2022   PLT 227.0 04/22/2022   Hemoglobin almost normal.  He will try the iron therapy and follow-up with a CBC and ferritin in 3 months. Subjective:   Chief Complaint: Follow-up after diverticular hemorrhage  HPI 82 year old white man here with his wife, was hospitalized in  December with GI bleeding that turned out to be left-sided diverticular in origin.  From 12 10-12 16 he underwent upper endoscopy, sigmoidoscopy, colonoscopy x 3 and capsule endoscopy of the small bowel.  He had some gastric erosions, he had 2 red spots possible AVMs in the small bowel and the capsule endoscopy and the diverticular hemorrhage.  It was difficult to get him cleared on his procedures.  He does have chronic constipation but says daily MiraLAX is allowing for regular defecation without difficulty, at this point.  He has not had further bleeding.  There were some diminutive or small polyps seen in the right colon but not removed.  Characterized as a few.  Hospitalization summary per primary care note March 26, 2022 -GI was consulted, EGD showed no signs of recent bleeding, possible chronic GI blood loss secondary to gastric erosions -Colonoscopy on 12/12 showed significant blood clot burden in the entire large colon. -CT angio showed no definitive evidence of active GI bleeding -underwent capsule endoscopy on 12/13, showed red spot in distal small bowel - underwent flex sig on 12/15 with diverticulosis, showed clotted maroon blood with areas of red blood in the sigmoid colon.  -Patient received total 5 units of packed RBC transfusion -Underwent colonoscopy again on 12/16, showed severe diverticulosis in the entire examined colon, worse in the left colon, internal hemorrhoids.  No active bleeding.  Suspected patient had a slow/stuttering left-sided diverticular bleed,  has since stopped -H&H remained stable for over 48 hours, hemoglobin 9.4 at discharge. MiraLAX recommended twice daily to avoid constipation.   He is improving at this point and wants to know if he can return to a normal diet he has been on a low fiber diet.  He also wants to know if he may resume his 81 mg aspirin taken because of coronary artery disease.  He had follow-up labs with Dr. Diona Browner in early January.  Lab  Results  Component Value Date   WBC 7.1 03/26/2022   HGB 10.9 (L) 03/26/2022   HCT 33.2 (L) 03/26/2022   MCV 88.8 03/26/2022   PLT 251.0 03/26/2022   Lab Results  Component Value Date   FERRITIN 12.8 (L) 03/26/2022   He is not on any iron supplementation at this point. Allergies  Allergen Reactions   Sulfonamide Derivatives Hives and Nausea And Vomiting   Codeine Nausea Only   Current Meds  Medication Sig   acetaminophen (TYLENOL) 500 MG tablet Take 1,000 mg by mouth as needed for moderate pain.   aspirin EC 81 MG tablet Take 1 tablet (81 mg total) by mouth daily. Swallow whole.   benzonatate (TESSALON) 200 MG capsule Take 1 capsule (200 mg total) by mouth 2 (two) times daily as needed for cough.   cetirizine (ZYRTEC) 10 MG tablet Take 10 mg by mouth as needed for allergies.   Chlorpheniramine-DM (CORICIDIN HBP COUGH/COLD PO) Take 1 tablet by mouth daily as needed (congestion).   Cholecalciferol (VITAMIN D-3 PO) Take 1 capsule by mouth at bedtime.   ferrous sulfate 325 (65 FE) MG EC tablet 1 tablet with food every other day   finasteride (PROSCAR) 5 MG tablet Take 1 tablet (5 mg total) by mouth daily.   furosemide (LASIX) 40 MG tablet TAKE 1 TABLET BY MOUTH  DAILY (DUE FOR AN APPT IN  APRIL AND CAN GET YEARLY  SUPPLY AT THAT VISIT.   PLEASE CALL TO SCHEDULE) (Patient taking differently: Take 40 mg by mouth daily.)   levothyroxine (SYNTHROID) 150 MCG tablet TAKE 1 TABLET BY MOUTH EVERY DAY BEFORE BREAKFAST (Patient taking differently: Take 150 mcg by mouth daily before breakfast.)   Melatonin 5 MG CAPS Take 5 mg by mouth at bedtime.   Multiple Vitamins-Minerals (CENTRUM SILVER 50+MEN PO) Take 1 tablet by mouth at bedtime.   Multiple Vitamins-Minerals (PRESERVISION AREDS 2 PO) Take 1 tablet by mouth in the morning and at bedtime.   omeprazole (PRILOSEC) 20 MG capsule Take 1 capsule (20 mg total) by mouth daily.   polyethylene glycol (MIRALAX / GLYCOLAX) 17 g packet Take 17 g by mouth  2 (two) times daily. For constipation. Also available OTC. (Patient taking differently: Take 17 g by mouth daily. For constipation. Also available OTC.)   PRESCRIPTION MEDICATION every 6 (six) months. *Antibiotic Injection in Eye*   ramipril (ALTACE) 2.5 MG capsule TAKE 1 CAPSULE BY MOUTH EVERY DAY   rosuvastatin (CRESTOR) 10 MG tablet Take 1 tablet (10 mg total) by mouth daily.   terazosin (HYTRIN) 10 MG capsule Take 1 capsule (10 mg total) by mouth daily. 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 15 yrs ago x1   Hypertrophy of prostate without urinary obstruction and other lower urinary tract symptoms (LUTS)    Lipoprotein deficiencies    Other and unspecified hyperlipidemia    Other primary cardiomyopathies    Pain in joint, lower leg    Personal history of colonic polyps    Personal history of other diseases of digestive system    Pes anserinus tendinitis or bursitis    left shoulder remains an issue   Sinoatrial node dysfunction (HCC)    [name] follows   Thoracic aneurysm without mention of rupture     Thoracic or lumbosacral neuritis or radiculitis, unspecified    Unspecified disorder of skin and subcutaneous tissue    Unspecified essential hypertension    Unspecified vitamin D deficiency    Past Surgical History:  Procedure Laterality Date   BASAL CELL CARCINOMA EXCISION  11/2016   Dr. Nevada Crane   CATARACT EXTRACTION, BILATERAL     COLONOSCOPY W/ POLYPECTOMY     COLONOSCOPY WITH PROPOFOL N/A 03/03/2022   Procedure: COLONOSCOPY WITH PROPOFOL;  Surgeon: Yetta Flock, MD;  Location: [facility] END[facility]COPY;  Service: Gastroenterology;  Laterality: N/A;   COLONOSCOPY WITH PROPOFOL N/A 03/04/2022   Procedure: COLONOSCOPY WITH PROPOFOL;  Surgeon: Yetta Flock, MD;  Location: [place];  Service: Gastroenterology;  Laterality: N/A;   COLONOSCOPY WITH PROPOFOL N/A 03/08/2022   Procedure: COLONOSCOPY WITH PROPOFOL;  Surgeon: Yetta Flock, MD;  Location: [place];  Service: Gastroenterology;  Laterality: N/A;   DOBUTAMINE STRESS ECHO  1/07   Lateral hypokinesis but no ischemia   ESOPHAGOGASTRODUODENOSCOPY  11/07   Schatzki's ring, non bleeding erosive gastropathy [place]   ESOPHAGOGASTRODUODENOSCOPY (EGD) WITH PROPOFOL N/A 03/02/2022   Procedure: ESOPHAGOGASTRODUODENOSCOPY (EGD) WITH PROPOFOL;  Surgeon: Mauri Pole, MD;  Location: [place] ENDOSCOPY;  Service: Gastroenterology;  Laterality: N/A;   FLEXIBLE SIGMOIDOSCOPY N/A 03/07/2022   Procedure: FLEXIBLE SIGMOIDOSCOPY;  Surgeon: Yetta Flock, MD;  Location: [place];  Service: Gastroenterology;  Laterality: N/A;   GIVENS CAPSULE STUDY N/A 03/05/2022   Procedure: GIVENS CAPSULE STUDY;  Surgeon: Yetta Flock, MD;  Location: [place];  Service: Gastroenterology;  Laterality: N/A;   KNEE SURGERY Left 1947   LYMPH NODE DISSECTION N/A 08/05/2016   Procedure: LIMITED LYMPH NODE DISSECTION;  Surgeon: Armandina Gemma, MD;  Location: [facility];  Service: General;  Laterality: N/A;   Pulmonary functioning tests   1. 2003  2. 2005   1. Diminished lung capacity  2. 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   Review of Systems As per HPI  Objective:   Physical Exam There were no vitals taken for this visit. Elderly NAD WM Abdomen is soft and nontender without organomegaly or mass  Data reviewed includes hospital records from December including procedure reports, radiology reports, labs, progress notes.  Primary care note of March 26, 2022.  Labs as above.  Endocrinology note from 04/10/2022 as well.  TSH was normal CMET normal except for glucose 165

## 2022-04-23 ENCOUNTER — Other Ambulatory Visit: Payer: Self-pay

## 2022-04-23 DIAGNOSIS — D509 Iron deficiency anemia, unspecified: Secondary | ICD-10-CM

## 2022-04-28 ENCOUNTER — Telehealth: Payer: Self-pay

## 2022-04-28 ENCOUNTER — Ambulatory Visit (INDEPENDENT_AMBULATORY_CARE_PROVIDER_SITE_OTHER): Payer: PPO

## 2022-04-28 ENCOUNTER — Telehealth: Payer: Self-pay | Admitting: Family Medicine

## 2022-04-28 ENCOUNTER — Ambulatory Visit: Payer: PPO

## 2022-04-28 VITALS — Ht 72.0 in | Wt 214.0 lb

## 2022-04-28 DIAGNOSIS — Z0000 Encounter for general adult medical examination without abnormal findings: Secondary | ICD-10-CM | POA: Diagnosis not present

## 2022-04-28 NOTE — Telephone Encounter (Signed)
Unable to reach patient for scheduled AWV. No answer. Left message. Okay to reschedule.

## 2022-04-28 NOTE — Progress Notes (Signed)
Subjective:   Dominic Kane is a 82 y.o. male who presents for Medicare Annual/Subsequent preventive examination.  Review of Systems    No ROS.  Medicare Wellness Virtual Visit.  Visual/audio telehealth visit, UTA vital signs.   See social history for additional risk factors.   Cardiac Risk Factors include: advanced age (>46mn, >>19women);male gender;diabetes mellitus     Objective:    Today's Vitals   04/28/22 1407  Weight: 214 lb (97.1 kg)  Height: 6' (1.829 m)   Body mass index is 29.02 kg/m.     04/28/2022    2:05 PM 03/09/2022    8:33 AM 03/08/2022   12:01 PM 03/07/2022    9:31 AM 03/01/2022    5:00 PM 04/25/2021    2:04 PM 04/24/2020    2:03 PM  Advanced Directives  Does Patient Have a Medical Advance Directive? Yes No Yes No Yes Yes Yes  Type of AParamedicof [place] will  [place] will  Healthcare Power of [place] Harbor[place] will [place] will  Does patient want to make changes to medical advance directive? No - Patient declined No - Patient declined   No - Patient declined Yes (MAU/Ambulatory/Procedural Areas - Information given)   Copy of [place] Chart? No - copy requested No - copy requested No - copy requested    No - copy requested  Would patient like information on creating a medical advance directive?  No - Patient declined  No - Patient declined       Current Medications (verified) Outpatient Encounter Medications as of 04/28/2022  Medication Sig   acetaminophen (TYLENOL) 500 MG tablet Take 1,000 mg by mouth as needed for moderate pain.   aspirin EC 81 MG tablet Take 1 tablet (81 mg total) by mouth daily. Swallow whole.   benzonatate (TESSALON) 200 MG capsule Take 1 capsule (200 mg total) by mouth 2 (two) times daily as needed for cough.   cetirizine (ZYRTEC) 10 MG tablet Take 10 mg by mouth as needed for allergies.    Chlorpheniramine-DM (CORICIDIN HBP COUGH/COLD PO) Take 1 tablet by mouth daily as needed (congestion).   Cholecalciferol (VITAMIN D-3 PO) Take 1 capsule by mouth at bedtime.   ferrous sulfate 325 (65 FE) MG EC tablet 1 tablet with food every other day   finasteride (PROSCAR) 5 MG tablet Take 1 tablet (5 mg total) by mouth daily.   furosemide (LASIX) 40 MG tablet TAKE 1 TABLET BY MOUTH  DAILY (DUE FOR AN APPT IN  APRIL AND CAN GET YEARLY  SUPPLY AT THAT VISIT.   PLEASE CALL TO SCHEDULE) (Patient taking differently: Take 40 mg by mouth daily.)   levothyroxine (SYNTHROID) 150 MCG tablet TAKE 1 TABLET BY MOUTH EVERY DAY BEFORE BREAKFAST (Patient taking differently: Take 150 mcg by mouth daily before breakfast.)   Melatonin 5 MG CAPS Take 5 mg by mouth at bedtime.   Multiple Vitamins-Minerals (CENTRUM SILVER 50+MEN PO) Take 1 tablet by mouth at bedtime.   Multiple Vitamins-Minerals (PRESERVISION AREDS 2 PO) Take 1 tablet by mouth in the morning and at bedtime.   omeprazole (PRILOSEC) 20 MG capsule Take 1 capsule (20 mg total) by mouth daily.   polyethylene glycol (MIRALAX / GLYCOLAX) 17 g packet Take 17 g by mouth 2 (two) times daily. For constipation. Also available OTC. (Patient taking differently: Take 17 g by mouth daily. For constipation. Also available OTC.)  PRESCRIPTION MEDICATION every 6 (six) months. *Antibiotic Injection in Eye*   ramipril (ALTACE) 2.5 MG capsule TAKE 1 CAPSULE BY MOUTH EVERY DAY   rosuvastatin (CRESTOR) 10 MG tablet Take 1 tablet (10 mg total) by mouth daily.   terazosin (HYTRIN) 10 MG capsule Take 1 capsule (10 mg total) by mouth daily. (Patient taking differently: Take 10 mg by mouth at bedtime.)   No facility-administered encounter medications on file as of 04/28/2022.    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 15 yrs ago x1   Hypertrophy of prostate without urinary obstruction and other lower urinary tract symptoms (LUTS)    Lipoprotein deficiencies    Other and unspecified hyperlipidemia    Other primary cardiomyopathies    Pain in joint, lower leg    Personal history of colonic polyps    Personal history of other diseases of digestive system    Pes anserinus tendinitis or bursitis    left shoulder remains an issue   Sinoatrial node dysfunction (HCC)    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 [place] Crane   CATARACT EXTRACTION, BILATERAL     COLONOSCOPY W/ POLYPECTOMY     COLONOSCOPY WITH PROPOFOL N/A 03/03/2022   Procedure: COLONOSCOPY WITH PROPOFOL;  Surgeon: Yetta Flock, MD;  Location: [facility] ENDOSCOPY;  Service: Gastroenterology;  Laterality: N/A;   COLONOSCOPY WITH PROPOFOL N/A 03/04/2022   Procedure: COLONOSCOPY WITH PROPOFOL;  Surgeon:  Yetta Flock, MD;  Location: [place];  Service: Gastroenterology;  Laterality: N/A;   COLONOSCOPY WITH PROPOFOL N/A 03/08/2022   Procedure: COLONOSCOPY WITH PROPOFOL;  Surgeon: Yetta Flock, MD;  Location: [place];  Service: Gastroenterology;  Laterality: N/A;   DOBUTAMINE STRESS ECHO  1/07   Lateral hypokinesis but no ischemia   ESOPHAGOGASTRODUODENOSCOPY  11/07   Schatzki's ring, non bleeding erosive gastropathy [place]   ESOPHAGOGASTRODUODENOSCOPY (EGD) WITH PROPOFOL N/A 03/02/2022   Procedure: ESOPHAGOGASTRODUODENOSCOPY (EGD) WITH PROPOFOL;  Surgeon: Mauri Pole, MD;  Location: [place] ENDOSCOPY;  Service: Gastroenterology;  Laterality: N/A;   FLEXIBLE SIGMOIDOSCOPY N/A 03/07/2022   Procedure: FLEXIBLE SIGMOIDOSCOPY;  Surgeon: Yetta Flock, MD;  Location: [place];  Service: Gastroenterology;  Laterality:  N/A;   GIVENS CAPSULE STUDY N/A 03/05/2022   Procedure: GIVENS CAPSULE STUDY;  Surgeon: Yetta Flock, MD;  Location: [place];  Service: Gastroenterology;  Laterality: N/A;   KNEE SURGERY Left 1947   LYMPH NODE DISSECTION N/A 08/05/2016   Procedure: LIMITED LYMPH NODE DISSECTION;  Surgeon: Armandina Gemma, MD;  Location: [facility];  Service: General;  Laterality: N/A;   Pulmonary functioning tests  1. 2003  2. 2005   1. Diminished lung capacity  2. Stable   THYROIDECTOMY N/A 08/05/2016   Procedure: TOTAL THYROIDECTOMY;  Surgeon: Armandina Gemma, MD;  Location: [facility];  Service: General;  Laterality: N/A;   TONSILLECTOMY     TOTAL KNEE ARTHROPLASTY Left 03/18/2016   Procedure: LEFT TOTAL KNEE ARTHROPLASTY;  Surgeon: Paralee Cancel, MD;  Location: [facility];  Service: Orthopedics;  Laterality: Left;   Family History  Problem Relation Age of Onset   Heart failure Mother    Coronary artery disease Mother    Stroke Mother    Hypothyroidism Mother    Cancer Father        LUNG   Cancer Sister        BREAST   Heart failure Maternal Grandmother     Hypothyroidism Daughter    Colon cancer Neg Hx    Social History   Socioeconomic History   Marital status: Married    Spouse name: Not on file   Number of children: 1   Years of education: Not on file   Highest education level: Not on file  Occupational History   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  Tobacco Use   Smoking status: Former    Types: Cigarettes    Quit date: 07/03/1980    Years since quitting: 41.8   Smokeless tobacco: Never  Vaping Use   Vaping Use: Never used  Substance and Sexual Activity   Alcohol use: Yes    Alcohol/week: 0.0 standard drinks of alcohol    Comment: occassionally   Drug use: No   Sexual activity: Not on file  Other Topics Concern   Not on file  Social History Narrative   Married 1 daughter   Retired from 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   2 caffeinated drinks a day no alcohol tobacco or drug use    former smoker   Social Determinants of Radio broadcast assistant Strain: Low Risk  (04/28/2022)   Overall Financial Resource Strain (CARDIA)    Difficulty of Paying Living Expenses: Not hard at all  Food Insecurity: No Food Insecurity (04/28/2022)   Hunger Vital Sign    Worried About Running Out of Food in the Last Year: Never true    [place] in the Last Year: Never true  Transportation Needs: No Transportation Needs (04/28/2022)   PRAPARE - Hydrologist (Medical): No    Lack of Transportation (Non-Medical): No  Physical Activity: Sufficiently Active (04/28/2022)   Exercise Vital Sign    Days of Exercise per Week: 5 days    Minutes of Exercise per Session: 30 min  Stress: No Stress Concern Present (04/28/2022)   [place]    Feeling of Stress : Not at all  Social Connections: Moderately Integrated (04/28/2022)   Social Connection and Isolation Panel [NHANES]    Frequency of Communication  with Friends and Family: More than three times a week  Frequency of Social Gatherings with Friends and Family: More than three times a week    Attends Religious Services: More than 4 times per year    Active Member of [employer] or Organizations: No    Attends Archivist Meetings: Never    Marital Status: Married    Tobacco Counseling Counseling given: Not Answered   Clinical Intake:  Pre-visit preparation completed: Yes        Diabetes: Yes (Followed by Endocrinology)  How often do you need to have someone help you when you read instructions, pamphlets, or other written materials from your doctor or pharmacy?: 1 - Never    Interpreter Needed?: No    Activities of Daily Living    04/28/2022    2:13 PM 03/01/2022    5:06 PM  In your present state of health, do you have any difficulty performing the following activities:  Hearing? 1   Comment Hearing aids   Vision? 0   Difficulty concentrating or making decisions? 0   Walking or climbing stairs? 0   Dressing or bathing? 0 0  Doing errands, shopping? 0   Preparing Food and eating ? N   Using the Toilet? N   In the past six months, have you accidently leaked urine? N   Do you have problems with loss of bowel control? N   Managing your Medications? N   Managing your Finances? N   Housekeeping or managing your Housekeeping? N     Patient Care Team: Jinny Sanders, MD as PCP - General (Family Medicine) Burnell Blanks, MD as PCP - Cardiology (Cardiology) Debbora Dus, [facility] as Pharmacist (Pharmacist)  Indicate any recent Medical Services you may have received from other than [facility] providers in the past year (date may be approximate).     Assessment:   This is a routine wellness examination for Dominic Kane.  I connected with  Dominic Kane on 04/28/22 by a audio enabled telemedicine application and verified that I am speaking with the correct person using two identifiers.  Patient Location:  Home  Provider Location: Office/Clinic  I discussed the limitations of evaluation and management by telemedicine. The patient expressed understanding and agreed to proceed.   Hearing/Vision screen Hearing Screening - Comments:: Wears hearing aids Vision Screening - Comments:: Last exam 10/2021 Dr. [facility], wears glasses  Dietary issues and exercise activities discussed: Current Exercise Habits: Home exercise routine, Type of exercise: walking Regular diet   Goals Addressed             This Visit's Progress    Maintain Healthy Lifestyle       Stay active Healthy diet       Depression Screen    04/28/2022    2:12 PM 04/25/2021    2:08 PM 04/24/2020    2:05 PM 03/30/2020   11:55 AM 02/16/2019   12:04 PM 02/09/2018   12:45 PM 01/05/2017    8:17 AM  PHQ 2/9 Scores  PHQ - 2 Score 0 0 0 0 0 0 0  PHQ- 9 Score   0  0 0 0    Fall Risk    04/28/2022    2:13 PM 04/25/2021    2:06 PM 04/24/2020    2:04 PM 03/30/2020   11:55 AM 02/16/2019   12:03 PM  [place] in the past year? 0 0 0 0 1  Comment     moving backwards and fell  Number falls in past yr:  0 0 0 0 1  Injury with Fall?  0 0 0 0  Risk for fall due to :  Other (Comment) Medication side effect  Medication side effect  Risk for fall due to: Comment  tripped     Follow up Falls evaluation completed;Falls prevention discussed Falls prevention discussed Falls evaluation completed;Falls prevention discussed Falls evaluation completed Falls evaluation completed;Falls prevention discussed    FALL RISK PREVENTION PERTAINING TO THE HOME: Home free of loose throw rugs in walkways, pet beds, electrical cords, etc? Yes  Adequate lighting in your home to reduce risk of falls? Yes   ASSISTIVE DEVICES UTILIZED TO PREVENT FALLS: Life alert? No  Use of a cane, walker or w/c? No  Grab bars in the bathroom? Yes  Shower chair or bench in shower? Yes  Elevated toilet seat or a handicapped toilet? Yes   TIMED UP AND  GO: Was the test performed? No .   Cognitive Function:    04/24/2020    2:11 PM 02/16/2019   12:07 PM 02/09/2018   12:46 PM 01/05/2017    8:17 AM  MMSE - Mini Mental State Exam  Not completed: Refused     Orientation to time  '5 5 5  '$ Orientation to Place  '5 5 5  '$ Registration  '3 3 3  '$ Attention/ Calculation  3 0 0  Recall  '3 3 3  '$ Language- name 2 objects   0 0  Language- repeat  '1 1 1  '$ Language- follow 3 step command   3 3  Language- read & follow direction   0 0  Write a sentence   0 0  Copy design   0 0  Total score   20 20        04/28/2022    2:14 PM  6CIT Screen  What Year? 0 points  What month? 0 points  What time? 0 points  Count back from 20 0 points  Months in reverse 0 points  Repeat phrase 0 points  Total Score 0 points    Immunizations Immunization History  Administered Date(s) Administered   Fluad Quad(high Dose 65+) 11/17/2018   Influenza Whole 12/23/2006, 12/09/2007, 12/22/2008   Influenza, High Dose Seasonal PF 11/17/2014, 12/12/2015, 12/18/2016, 12/22/2017, 12/07/2019, 11/07/2020, 10/31/2021   Influenza,inj,Quad PF,6+ Mos 01/06/2013   Influenza-Unspecified 11/22/2013   Moderna Covid-19 Vaccine Bivalent Booster 17yr & up 01/22/2021   Moderna Sars-Covid-2 Vaccination 04/06/2019, 05/04/2019, 02/20/2020   PNEUMOCOCCAL CONJUGATE-20 11/07/2020   Pneumococcal Conjugate-13 10/12/2014   Pneumococcal Polysaccharide-23 12/23/2006   Td 03/24/2001   Tdap 11/17/2014   Zoster Recombinat (Shingrix) 01/02/2022, 04/25/2022   Zoster, Live 06/23/2014   Screening Tests Health Maintenance  Topic Date Due   Diabetic kidney evaluation - Urine ACR  Never done   COVID-19 Vaccine (5 - 2023-24 season) 11/22/2021   FOOT EXAM  01/21/2022   HEMOGLOBIN A1C  04/24/2022   OPHTHALMOLOGY EXAM  09/20/2022   Diabetic kidney evaluation - eGFR measurement  03/27/2023   Medicare Annual Wellness (AWV)  04/29/2023   DTaP/Tdap/Td (3 - Td or Tdap) 11/16/2024   Pneumonia Vaccine 82  Years old  Completed   INFLUENZA VACCINE  Completed   Zoster Vaccines- Shingrix  Completed   HPV VACCINES  Aged Out   COLONOSCOPY (Pts [phone] coverage will need to be confirmed)  Discontinued    Health Maintenance Health Maintenance Due  Topic Date Due   Diabetic kidney evaluation - Urine ACR  Never done   COVID-19 Vaccine (5 -  2023-24 season) 11/22/2021   FOOT EXAM  01/21/2022   HEMOGLOBIN A1C  04/24/2022   Lung Cancer Screening: (Low Dose CT Chest recommended if Age 65-80 years, 30 pack-year currently smoking OR have quit w/in 15years.) does not qualify.   Hepatitis C Screening: does not qualify.  Vision Screening: Recommended annual ophthalmology exams for early detection of glaucoma and other disorders of the eye.  Dental Screening: Recommended annual dental exams for proper oral hygiene  Community Resource Referral / Chronic Care Management: CRR required this visit?  No   CCM required this visit?  No      Plan:     I have personally reviewed and noted the following in the patient's chart:   Medical and social history Use of alcohol, tobacco or illicit drugs  Current medications and supplements including opioid prescriptions. Patient is not currently taking opioid prescriptions. Functional ability and status Nutritional status Physical activity Advanced directives List of other physicians Hospitalizations, surgeries, and ER visits in previous 12 months Vitals Screenings to include cognitive, depression, and falls Referrals and appointments  In addition, I have reviewed and discussed with patient certain preventive protocols, quality metrics, and best practice recommendations. A written personalized care plan for preventive services as well as general preventive health recommendations were provided to patient.     Leta Jungling, LPN   0/05/90

## 2022-04-28 NOTE — Telephone Encounter (Signed)
Patient called, stated he was supposed to receive a call today from [facility]  Did not receive a call, appointment was at 1:30pm

## 2022-04-28 NOTE — Patient Instructions (Addendum)
[name] , Thank you for taking time to come for your Medicare Wellness Visit. I appreciate your ongoing commitment to your health goals. Please review the following plan we discussed and let me know if I can assist you in the future.   These are the goals we discussed:  Goals Addressed             This Visit's Progress    Maintain Healthy Lifestyle       Stay active Healthy diet         This is a list of the screening recommended for you and due dates:  Health Maintenance  Topic Date Due   Yearly kidney health urinalysis for diabetes  Never done   COVID-19 Vaccine (5 - 2023-24 season) 11/22/2021   Complete foot exam   01/21/2022   Hemoglobin A1C  04/24/2022   Eye exam for diabetics  09/20/2022   Yearly kidney function blood test for diabetes  03/27/2023   Medicare Annual Wellness Visit  04/29/2023   DTaP/Tdap/Td vaccine (3 - Td or Tdap) 11/16/2024   Pneumonia Vaccine  Completed   Flu Shot  Completed   Zoster (Shingles) Vaccine  Completed   HPV Vaccine  Aged Out   Colon Cancer Screening  Discontinued    Advanced directives: End of life planning; Advance aging; Advanced directives discussed.  Copy of current HCPOA/Living Will requested.    Conditions/risks identified: none new  Next appointment: Follow up in one year for your annual wellness visit.   Preventive Care 13 Years and Older, Male  Preventive care refers to lifestyle choices and visits with your health care provider that can promote health and wellness. What does preventive care include? A yearly physical exam. This is also called an annual well check. Dental exams once or twice a year. Routine eye exams. Ask your health care provider how often you should have your eyes checked. Personal lifestyle choices, including: Daily care of your teeth and gums. Regular physical activity. Eating a healthy diet. Avoiding tobacco and drug use. Limiting alcohol use. Practicing safe sex. Taking low doses of  aspirin every day. Taking vitamin and mineral supplements as recommended by your health care provider. What happens during an annual well check? The services and screenings done by your health care provider during your annual well check will depend on your age, overall health, lifestyle risk factors, and family history of disease. Counseling  Your health care provider may ask you questions about your: Alcohol use. Tobacco use. Drug use. Emotional well-being. Home and relationship well-being. Sexual activity. Eating habits. History of falls. Memory and ability to understand (cognition). Work and work Statistician. Screening  You may have the following tests or measurements: Height, weight, and BMI. Blood pressure. Lipid and cholesterol levels. These may be checked every 5 years, or more frequently if you are over 42 years old. Skin check. Lung cancer screening. You may have this screening every year starting at age 53 if you have a 30-pack-year history of smoking and currently smoke or have quit within the past 15 years. Fecal occult blood test (FOBT) of the stool. You may have this test every year starting at age 30. Flexible sigmoidoscopy or colonoscopy. You may have a sigmoidoscopy every 5 years or a colonoscopy every 10 years starting at age 82. Prostate cancer screening. Recommendations will vary depending on your family history and other risks. Hepatitis C blood test. Hepatitis B blood test. Sexually transmitted disease (STD) testing. Diabetes screening. This is done by  checking your blood sugar (glucose) after you have not eaten for a while (fasting). You may have this done every 1-3 years. Abdominal aortic aneurysm (AAA) screening. You may need this if you are a current or former smoker. Osteoporosis. You may be screened starting at age 72 if you are at high risk. Talk with your health care provider about your test results, treatment options, and if necessary, the need for more  tests. Vaccines  Your health care provider may recommend certain vaccines, such as: Influenza vaccine. This is recommended every year. Tetanus, diphtheria, and acellular pertussis (Tdap, Td) vaccine. You may need a Td booster every 10 years. Zoster vaccine. You may need this after age 28. Pneumococcal 13-valent conjugate (PCV13) vaccine. One dose is recommended after age 21. Pneumococcal polysaccharide (PPSV23) vaccine. One dose is recommended after age 28. Talk to your health care provider about which screenings and vaccines you need and how often you need them. This information is not intended to replace advice given to you by your health care provider. Make sure you discuss any questions you have with your health care provider. Document Released: 04/06/2015 Document Revised: 11/28/2015 Document Reviewed: 01/09/2015 Elsevier Interactive Patient Education  2017 [employer] Prevention in the Home Falls can cause injuries. They can happen to people of all ages. There are many things you can do to make your home safe and to help prevent falls. What can I do on the outside of my home? Regularly fix the edges of walkways and driveways and fix any cracks. Remove anything that might make you trip as you walk through a door, such as a raised step or threshold. Trim any bushes or trees on the path to your home. Use bright outdoor lighting. Clear any walking paths of anything that might make someone trip, such as rocks or tools. Regularly check to see if handrails are loose or broken. Make sure that both sides of any steps have handrails. Any raised decks and porches should have guardrails on the edges. Have any leaves, snow, or ice cleared regularly. Use sand or salt on walking paths during winter. Clean up any spills in your garage right away. This includes oil or grease spills. What can I do in the bathroom? Use night lights. Install grab bars by the toilet and in the tub and shower.  Do not use towel bars as grab bars. Use non-skid mats or decals in the tub or shower. If you need to sit down in the shower, use a plastic, non-slip stool. Keep the floor dry. Clean up any water that spills on the floor as soon as it happens. Remove soap buildup in the tub or shower regularly. Attach bath mats securely with double-sided non-slip rug tape. Do not have throw rugs and other things on the floor that can make you trip. What can I do in the bedroom? Use night lights. Make sure that you have a light by your bed that is easy to reach. Do not use any sheets or blankets that are too big for your bed. They should not hang down onto the floor. Have a firm chair that has side arms. You can use this for support while you get dressed. Do not have throw rugs and other things on the floor that can make you trip. What can I do in the kitchen? Clean up any spills right away. Avoid walking on wet floors. Keep items that you use a lot in easy-to-reach places. If you need to reach  something above you, use a strong step stool that has a grab bar. Keep electrical cords out of the way. Do not use floor polish or wax that makes floors slippery. If you must use wax, use non-skid floor wax. Do not have throw rugs and other things on the floor that can make you trip. What can I do with my stairs? Do not leave any items on the stairs. Make sure that there are handrails on both sides of the stairs and use them. Fix handrails that are broken or loose. Make sure that handrails are as long as the stairways. Check any carpeting to make sure that it is firmly attached to the stairs. Fix any carpet that is loose or worn. Avoid having throw rugs at the top or bottom of the stairs. If you do have throw rugs, attach them to the floor with carpet tape. Make sure that you have a light switch at the top of the stairs and the bottom of the stairs. If you do not have them, ask someone to add them for you. What else  can I do to help prevent falls? Wear shoes that: Do not have high heels. Have rubber bottoms. Are comfortable and fit you well. Are closed at the toe. Do not wear sandals. If you use a stepladder: Make sure that it is fully opened. Do not climb a closed stepladder. Make sure that both sides of the stepladder are locked into place. Ask someone to hold it for you, if possible. Clearly mark and make sure that you can see: Any grab bars or handrails. First and last steps. Where the edge of each step is. Use tools that help you move around (mobility aids) if they are needed. These include: Canes. Walkers. Scooters. Crutches. Turn on the lights when you go into a dark area. Replace any light bulbs as soon as they burn out. Set up your furniture so you have a clear path. Avoid moving your furniture around. If any of your floors are uneven, fix them. If there are any pets around you, be aware of where they are. Review your medicines with your doctor. Some medicines can make you feel dizzy. This can increase your chance of falling. Ask your doctor what other things that you can do to help prevent falls. This information is not intended to replace advice given to you by your health care provider. Make sure you discuss any questions you have with your health care provider. Document Released: 01/04/2009 Document Revised: 08/16/2015 Document Reviewed: 04/14/2014 Elsevier Interactive Patient Education  2017 [employer].

## 2022-04-29 ENCOUNTER — Ambulatory Visit: Payer: PPO | Admitting: Gastroenterology

## 2022-05-01 NOTE — Progress Notes (Signed)
Office Visit    Patient Name: Dominic Kane Date of Encounter: 05/02/2022  PCP:  Jinny Sanders, MD   [facility]  Cardiologist:  Lauree Chandler, MD  Advanced Practice Provider:  No care team member to display Electrophysiologist:  None   HPI    Dominic Kane is a 82 y.o. male with a past medical history significant for diastolic and systolic CHF, hypertension, nonobstructive coronary artery disease, thoracic aortic aneurysm, nonischemic cardiomyopathy, benign prostatic hypertrophy and hyperlipidemia presents today for follow-up appointment.  Prior to 2005, he was known to have an LVEF around 35%.  Had an episode of chest pain in 2005 leading to cardiac catheterization May 2005 which showed mild nonobstructive coronary artery disease.  EF was noted to be 65%.  An echocardiogram during that admission in May 2005 in [place] showed an ejection fraction of 55 to 60% with the ascending aorta at the sinotubular junction measuring 4.1 cm.  LVEF had been noted to be around 45% on studies in 2010, 2011.  Echo September 2013 showed LVEF around 30%.  Cardiac MRI December 2014 showed mild enlargement of aortic root (4.2 cm) and EF of 44%.  Last stress test 9/13 without ischemia.  Echo May 2021 with LVEF 30 to 35% with global hypokinesis.  Mild AI.  Mild dilatation of the ascending aorta.  Nuclear stress test May 2021 with no ischemia.  Chest CTA May 2022 with stable ascending aorta 4.3 cm.  Last seen 04/04/2021 for follow-up.  Asymptomatic at that time.  Today, he states that he had some of his first bouts of shortness of breath and chest pain back in October or November.  He backed off what he was doing and it went away.  He does lots of lifting overhead and was working at the time.  He still having some fatigue but had a GI bleed back in December so attributes it to that.  He still can do what ever he wants to do but needs to take a rest after 2 to 3 hours of work.   Particularly yard work.  We discussed repeating an echocardiogram since he has not had one in a few years and we will need to update his CTA in May for his annual surveillance of his ascending aortic aneurysm.   No edema, orthopnea, PND. Reports no palpitations.    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 15 yrs ago x1   Hypertrophy of prostate without urinary obstruction and other lower urinary tract symptoms (  LUTS)    Lipoprotein deficiencies    Other and unspecified hyperlipidemia    Other primary cardiomyopathies    Pain in joint, lower leg    Personal history of colonic polyps    Personal history of other diseases of digestive system    Pes anserinus tendinitis or bursitis    left shoulder remains an issue   Sinoatrial node dysfunction ([place])    [name] follows   Thoracic aneurysm without mention of rupture    Thoracic or lumbosacral neuritis or radiculitis, unspecified    Unspecified disorder of skin and subcutaneous tissue    Unspecified essential hypertension    Unspecified vitamin D deficiency    Past Surgical History:  Procedure  Laterality Date   BASAL CELL CARCINOMA EXCISION  11/2016   Dr. Nevada Crane   CATARACT EXTRACTION, BILATERAL     COLONOSCOPY W/ POLYPECTOMY     COLONOSCOPY WITH PROPOFOL N/A 03/03/2022   Procedure: COLONOSCOPY WITH PROPOFOL;  Surgeon: Yetta Flock, MD;  Location: [facility];  Service: Gastroenterology;  Laterality: N/A;   COLONOSCOPY WITH PROPOFOL N/A 03/04/2022   Procedure: COLONOSCOPY WITH PROPOFOL;  Surgeon: Yetta Flock, MD;  Location: [facility];  Service: Gastroenterology;  Laterality: N/A;   COLONOSCOPY WITH PROPOFOL N/A 03/08/2022   Procedure: COLONOSCOPY WITH PROPOFOL;  Surgeon: Yetta Flock, MD;  Location: [place];  Service: Gastroenterology;  Laterality: N/A;   DOBUTAMINE STRESS ECHO  1/07   Lateral hypokinesis but no ischemia   ESOPHAGOGASTRODUODENOSCOPY  11/07   Schatzki's ring, non bleeding erosive gastropathy [place]   ESOPHAGOGASTRODUODENOSCOPY (EGD) WITH PROPOFOL N/A 03/02/2022   Procedure: ESOPHAGOGASTRODUODENOSCOPY (EGD) WITH PROPOFOL;  Surgeon: Mauri Pole, MD;  Location: [facility];  Service: Gastroenterology;  Laterality: N/A;   FLEXIBLE SIGMOIDOSCOPY N/A 03/07/2022   Procedure: FLEXIBLE SIGMOIDOSCOPY;  Surgeon: Yetta Flock, MD;  Location: [facility];  Service: Gastroenterology;  Laterality: N/A;   GIVENS CAPSULE STUDY N/A 03/05/2022   Procedure: GIVENS CAPSULE STUDY;  Surgeon: Yetta Flock, MD;  Location: [facility];  Service: Gastroenterology;  Laterality: N/A;   KNEE SURGERY Left 1947   LYMPH NODE DISSECTION N/A 08/05/2016   Procedure: LIMITED LYMPH NODE DISSECTION;  Surgeon: Armandina Gemma, MD;  Location: [facility];  Service: General;  Laterality: N/A;   Pulmonary functioning tests  1. 2003  2. 2005   1. Diminished lung capacity  2. Stable   THYROIDECTOMY N/A 08/05/2016   Procedure: TOTAL THYROIDECTOMY;  Surgeon: Armandina Gemma, MD;  Location: [facility];  Service: General;  Laterality: N/A;   TONSILLECTOMY      TOTAL KNEE ARTHROPLASTY Left 03/18/2016   Procedure: LEFT TOTAL KNEE ARTHROPLASTY;  Surgeon: Paralee Cancel, MD;  Location: [facility];  Service: Orthopedics;  Laterality: Left;    Allergies  Allergies  Allergen Reactions   Sulfonamide Derivatives Hives and Nausea And Vomiting   Codeine Nausea Only     EKGs/Labs/Other Studies Reviewed:   The following studies were reviewed today:  CT Angio 08/02/21  IMPRESSION: Grossly stable 4.1 cm ascending thoracic aortic aneurysm. Recommend annual imaging followup by CTA or MRA. This recommendation follows 2010 ACCF/AHA/AATS/ACR/ASA/SCA/SCAI/SIR/STS/SVM Guidelines for the Diagnosis and Management of Patients with Thoracic Aortic Disease. Circulation. 2010; [id number]. Aortic aneurysm NOS (ICD10-I71.9).   Echo 08/10/19:   1. Left ventricular ejection fraction, by estimation, is 30 to 35%. The  left ventricle has moderately decreased function. The left ventricle has  no regional wall motion abnormalities. There is mild concentric left  ventricular hypertrophy. Left  ventricular diastolic parameters are consistent with Grade I diastolic  dysfunction (impaired relaxation). Elevated left ventricular end-diastolic  pressure.   2. Right ventricular systolic function is normal. The right ventricular  size is normal. There is normal pulmonary artery systolic pressure.   3. Left atrial size was moderately dilated.   4. The mitral valve is normal in structure. Trivial mitral valve  regurgitation. No evidence of mitral stenosis.   5. The aortic valve is tricuspid. Aortic valve regurgitation is mild. No  aortic stenosis is present.   6. Aortic dilatation noted. 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   7. The inferior vena cava is normal in size with greater than 50%  respiratory variability, suggesting right atrial pressure of 3 mmHg.    EKG:  EKG is  ordered today. The ekg ordered today demonstrates: sinus,  RBBB  EKG:  EKG is not ordered today.    Recent Labs: 03/01/2022: ALT 15; B Natriuretic Peptide 149.7; TSH 0.707 03/26/2022: BUN 25; Creatinine, Ser 1.19; Sodium 140 04/04/2022: Potassium 4.3 04/22/2022: Hemoglobin 11.8; Platelets 227.0  Recent Lipid Panel    Component Value Date/Time   CHOL 127 10/22/2021 0727   TRIG 96.0 10/22/2021 0727   HDL 35.90 (L) 10/22/2021 0727   CHOLHDL 4 10/22/2021 0727   VLDL 19.2 10/22/2021 0727   LDLCALC 72 10/22/2021 0727     Home Medications   Current Meds  Medication Sig   acetaminophen (TYLENOL) 500 MG tablet Take 1,000 mg by mouth as needed for moderate pain.   aspirin EC 81 MG tablet Take 1 tablet (81 mg total) by mouth daily. Swallow whole.   cetirizine (ZYRTEC) 10 MG tablet Take 10 mg by mouth as needed for allergies.   Cholecalciferol (VITAMIN D-3 PO) Take 1 capsule by mouth at bedtime.   ferrous sulfate 325 (65 FE) MG EC tablet 1 tablet with food every other day   finasteride (PROSCAR) 5 MG tablet Take 1 tablet (5 mg total) by mouth daily.   furosemide (LASIX) 40 MG tablet TAKE 1 TABLET BY MOUTH  DAILY (DUE FOR AN APPT IN  APRIL AND CAN GET YEARLY  SUPPLY AT THAT VISIT.   PLEASE CALL TO SCHEDULE) (Patient taking differently: Take 40 mg by mouth daily.)   levothyroxine (SYNTHROID) 150 MCG tablet TAKE 1 TABLET BY MOUTH EVERY DAY BEFORE BREAKFAST (Patient taking differently: Take 150 mcg by mouth daily before breakfast.)   Melatonin 5 MG CAPS Take 5 mg by mouth at bedtime.   Multiple Vitamins-Minerals (CENTRUM SILVER 50+MEN PO) Take 1 tablet by mouth at bedtime.   Multiple Vitamins-Minerals (PRESERVISION AREDS 2 PO) Take 1 tablet by mouth in the morning and at bedtime.   omeprazole (PRILOSEC) 20 MG capsule Take 1 capsule (20 mg total) by mouth daily.   polyethylene glycol (MIRALAX / GLYCOLAX) 17 g packet Take 17 g by mouth 2 (two) times daily. For constipation. Also available OTC. (Patient taking differently: Take 17 g by mouth daily. For  constipation. Also available OTC.)   PRESCRIPTION MEDICATION every 6 (six) months. *Antibiotic Injection in Eye*   ramipril (ALTACE) 2.5 MG capsule TAKE 1 CAPSULE BY MOUTH EVERY DAY   rosuvastatin (CRESTOR) 10 MG tablet Take 1 tablet (10 mg total) by mouth daily.   terazosin (HYTRIN) 10 MG capsule Take 1 capsule (10 mg total) by mouth daily. (Patient taking differently: Take 10 mg by mouth at bedtime.)     Review of Systems  All other systems reviewed and are otherwise negative except as noted above.  Physical Exam    VS:  BP 118/76   Pulse 75   Ht 6' (1.829 m)   Wt 214 lb 3.2 oz (97.2 kg)   SpO2 96%   BMI 29.05 kg/m  , BMI Body mass index is 29.05 kg/m.  Wt Readings from Last 3 Encounters:  05/02/22 214 lb 3.2 oz (97.2 kg)  04/28/22 214 lb (97.1 kg)  04/22/22 214 lb (97.1 kg)     GEN: Well nourished, well developed, in no acute distress. HEENT: normal. Neck: Supple, no JVD, carotid bruits, or masses. Cardiac: RRR, no murmurs, rubs, or gallops. No clubbing, cyanosis, edema.  Radials/PT 2+ and equal bilaterally.  Respiratory:  Respirations regular and unlabored, clear to auscultation bilaterally. GI: Soft, nontender, nondistended. MS: No deformity or atrophy. Skin: Warm and dry, no rash. Neuro:  Strength and sensation are intact. Psych: Normal affect.  Assessment & Plan    CAD without angina -He has noticed some chest pains and SOB with certain activities but he has not had any lately -He has backed off on his activity level and this has made a difference -nitro as needed added today -update echo, if abnormal might consider a stress test   Nonischemic cardiomyopathy -update echo -no LE edema  Thoracic aortic aneurysm -due to CTA in May, we will schedule this today  HLD -LDL 61 -monitored closely by his PCP -continue Crestor 53m daily  Chronic systolic CHF -no swelling in legs -continue lasix 454mas needed  -euvolemic on exam today -update  echo  Carotid bruit, right -no lightheaded/dizziness -no syncope or presyncope -he had an [place] the neck more recently than 2016 but I am unable to view -might be due for another [place] time he is in the office  Diabetes mellitus type 2 -A1c is 6.0 in January -continue current medications        Disposition: Follow up 1 year with ChLauree ChandlerMD or APP.  Signed, TeElgie CollardPA-C 05/02/2022, 11:09 AM [place]

## 2022-05-02 ENCOUNTER — Ambulatory Visit: Payer: PPO | Attending: Physician Assistant | Admitting: Physician Assistant

## 2022-05-02 ENCOUNTER — Encounter: Payer: Self-pay | Admitting: Physician Assistant

## 2022-05-02 VITALS — BP 118/76 | HR 75 | Ht 72.0 in | Wt 214.2 lb

## 2022-05-02 DIAGNOSIS — I712 Thoracic aortic aneurysm, without rupture, unspecified: Secondary | ICD-10-CM

## 2022-05-02 DIAGNOSIS — I7121 Aneurysm of the ascending aorta, without rupture: Secondary | ICD-10-CM | POA: Diagnosis not present

## 2022-05-02 DIAGNOSIS — I2510 Atherosclerotic heart disease of native coronary artery without angina pectoris: Secondary | ICD-10-CM | POA: Diagnosis not present

## 2022-05-02 DIAGNOSIS — E7800 Pure hypercholesterolemia, unspecified: Secondary | ICD-10-CM

## 2022-05-02 DIAGNOSIS — E11311 Type 2 diabetes mellitus with unspecified diabetic retinopathy with macular edema: Secondary | ICD-10-CM

## 2022-05-02 DIAGNOSIS — I779 Disorder of arteries and arterioles, unspecified: Secondary | ICD-10-CM | POA: Diagnosis not present

## 2022-05-02 DIAGNOSIS — I5022 Chronic systolic (congestive) heart failure: Secondary | ICD-10-CM

## 2022-05-02 DIAGNOSIS — I428 Other cardiomyopathies: Secondary | ICD-10-CM

## 2022-05-02 MED ORDER — NITROGLYCERIN 0.4 MG SL SUBL: 0.4000 mg | SUBLINGUAL_TABLET | SUBLINGUAL | 3 refills | Status: DC | PRN

## 2022-05-02 NOTE — Patient Instructions (Signed)
Medication Instructions:  1.Start sublingual nitroglycerin 0.4 mg, place one tablet under the tongue every 5 minutes as needed for chest pain. Max 3 doses, if no relief call 911. *If you need a refill on your cardiac medications before your next appointment, please call your pharmacy*   Lab Work: None If you have labs (blood work) drawn today and your tests are completely normal, you will receive your results only by: [place] (if you have MyChart) OR A paper copy in the mail If you have any lab test that is abnormal or we need to change your treatment, we will call you to review the results.   Testing/Procedures: Your physician has requested that you have an echocardiogram. Echocardiography is a painless test that uses sound waves to create images of your heart. It provides your doctor with information about the size and shape of your heart and how well your heart's chambers and valves are working. This procedure takes approximately one hour. There are no restrictions for this procedure. Please do NOT wear cologne, perfume, aftershave, or lotions (deodorant is allowed). Please arrive 15 minutes prior to your appointment time.   You will be due for a CT of the chest to look at your aorta in May   Follow-Up: At [facility], you and your health needs are our priority.  As part of our continuing mission to provide you with exceptional heart care, we have created designated Provider Care Teams.  These Care Teams include your primary Cardiologist (physician) and Advanced Practice Providers (APPs -  Physician Assistants and Nurse Practitioners) who all work together to provide you with the care you need, when you need it.   Your next appointment:   1 year(s)  Provider:   Lauree Chandler, MD

## 2022-05-13 ENCOUNTER — Ambulatory Visit (HOSPITAL_COMMUNITY): Payer: PPO | Attending: Cardiology

## 2022-05-13 ENCOUNTER — Other Ambulatory Visit: Payer: Self-pay

## 2022-05-13 DIAGNOSIS — I428 Other cardiomyopathies: Secondary | ICD-10-CM | POA: Diagnosis not present

## 2022-05-13 LAB — ECHOCARDIOGRAM COMPLETE
Area-P 1/2: 3.97 cm2
P 1/2 time: 578 msec
S' Lateral: 3.9 cm

## 2022-05-13 MED ORDER — RAMIPRIL 2.5 MG PO CAPS: ORAL_CAPSULE | ORAL | 3 refills | Status: DC

## 2022-06-03 ENCOUNTER — Ambulatory Visit: Payer: PPO

## 2022-06-30 ENCOUNTER — Other Ambulatory Visit: Payer: Self-pay | Admitting: *Deleted

## 2022-06-30 MED ORDER — ROSUVASTATIN CALCIUM 10 MG PO TABS: 10.0000 mg | ORAL_TABLET | Freq: Every day | ORAL | 3 refills | Status: DC

## 2022-07-15 DIAGNOSIS — D2272 Melanocytic nevi of left lower limb, including hip: Secondary | ICD-10-CM | POA: Diagnosis not present

## 2022-07-15 DIAGNOSIS — D225 Melanocytic nevi of trunk: Secondary | ICD-10-CM | POA: Diagnosis not present

## 2022-07-15 DIAGNOSIS — L570 Actinic keratosis: Secondary | ICD-10-CM | POA: Diagnosis not present

## 2022-07-15 DIAGNOSIS — Z1283 Encounter for screening for malignant neoplasm of skin: Secondary | ICD-10-CM | POA: Diagnosis not present

## 2022-07-15 DIAGNOSIS — X32XXXD Exposure to sunlight, subsequent encounter: Secondary | ICD-10-CM | POA: Diagnosis not present

## 2022-07-18 ENCOUNTER — Ambulatory Visit: Payer: PPO | Attending: Physician Assistant

## 2022-07-18 DIAGNOSIS — E11311 Type 2 diabetes mellitus with unspecified diabetic retinopathy with macular edema: Secondary | ICD-10-CM | POA: Diagnosis not present

## 2022-07-18 DIAGNOSIS — I5022 Chronic systolic (congestive) heart failure: Secondary | ICD-10-CM | POA: Diagnosis not present

## 2022-07-18 DIAGNOSIS — I712 Thoracic aortic aneurysm, without rupture, unspecified: Secondary | ICD-10-CM

## 2022-07-18 DIAGNOSIS — I779 Disorder of arteries and arterioles, unspecified: Secondary | ICD-10-CM | POA: Diagnosis not present

## 2022-07-18 DIAGNOSIS — I428 Other cardiomyopathies: Secondary | ICD-10-CM

## 2022-07-18 DIAGNOSIS — I2510 Atherosclerotic heart disease of native coronary artery without angina pectoris: Secondary | ICD-10-CM

## 2022-07-18 DIAGNOSIS — I7121 Aneurysm of the ascending aorta, without rupture: Secondary | ICD-10-CM | POA: Diagnosis not present

## 2022-07-18 DIAGNOSIS — E7800 Pure hypercholesterolemia, unspecified: Secondary | ICD-10-CM | POA: Diagnosis not present

## 2022-07-19 LAB — BASIC METABOLIC PANEL
BUN/Creatinine Ratio: 18 (ref 10–24)
BUN: 19 mg/dL (ref 8–27)
CO2: 20 mmol/L (ref 20–29)
Calcium: 9.2 mg/dL (ref 8.6–10.2)
Chloride: 104 mmol/L (ref 96–106)
Creatinine, Ser: 1.06 mg/dL (ref 0.76–1.27)
Glucose: 120 mg/dL — ABNORMAL HIGH (ref 70–99)
Potassium: 4.6 mmol/L (ref 3.5–5.2)
Sodium: 141 mmol/L (ref 134–144)
eGFR: 71 mL/min/{1.73_m2} (ref >59)

## 2022-07-25 ENCOUNTER — Ambulatory Visit (HOSPITAL_COMMUNITY)
Admission: RE | Admit: 2022-07-25 | Discharge: 2022-07-25 | Disposition: A | Discharge status: Home / Self Care | Payer: PPO | Source: Ambulatory Visit | Attending: Physician Assistant | Admitting: Physician Assistant

## 2022-07-25 DIAGNOSIS — I7121 Aneurysm of the ascending aorta, without rupture: Secondary | ICD-10-CM | POA: Insufficient documentation

## 2022-07-25 DIAGNOSIS — J9811 Atelectasis: Secondary | ICD-10-CM | POA: Diagnosis not present

## 2022-07-25 MED ORDER — IOHEXOL 350 MG/ML SOLN
75.0000 mL | Freq: Once | INTRAVENOUS | Status: AC | PRN
  Administered 2022-07-25: 75 mL via INTRAVENOUS

## 2022-08-14 ENCOUNTER — Encounter: Payer: Self-pay | Admitting: Family Medicine

## 2022-08-14 ENCOUNTER — Ambulatory Visit (INDEPENDENT_AMBULATORY_CARE_PROVIDER_SITE_OTHER): Payer: PPO | Admitting: Family Medicine

## 2022-08-14 VITALS — BP 120/70 | HR 58 | Temp 98.0°F | Ht 70.0 in | Wt 216.2 lb

## 2022-08-14 DIAGNOSIS — R0683 Snoring: Secondary | ICD-10-CM | POA: Diagnosis not present

## 2022-08-14 DIAGNOSIS — Z794 Long term (current) use of insulin: Secondary | ICD-10-CM

## 2022-08-14 DIAGNOSIS — R5383 Other fatigue: Secondary | ICD-10-CM | POA: Diagnosis not present

## 2022-08-14 DIAGNOSIS — E11319 Type 2 diabetes mellitus with unspecified diabetic retinopathy without macular edema: Secondary | ICD-10-CM | POA: Diagnosis not present

## 2022-08-14 LAB — T3, FREE: T3, Free: 2.2 pg/mL — ABNORMAL LOW (ref 2.3–4.2)

## 2022-08-14 LAB — VITAMIN B12: Vitamin B-12: 616 pg/mL (ref 211–911)

## 2022-08-14 LAB — COMPREHENSIVE METABOLIC PANEL
ALT: 15 U/L (ref 0–53)
AST: 22 U/L (ref 0–37)
Albumin: 4.2 g/dL (ref 3.5–5.2)
Alkaline Phosphatase: 67 U/L (ref 39–117)
BUN: 19 mg/dL (ref 6–23)
CO2: 29 mEq/L (ref 19–32)
Calcium: 9.4 mg/dL (ref 8.4–10.5)
Chloride: 105 mEq/L (ref 96–112)
Creatinine, Ser: 1.01 mg/dL (ref 0.40–1.50)
GFR: 69.7 mL/min (ref >60.00)
Glucose, Bld: 82 mg/dL (ref 70–99)
Potassium: 5.3 mEq/L — ABNORMAL HIGH (ref 3.5–5.1)
Sodium: 140 mEq/L (ref 135–145)
Total Bilirubin: 1 mg/dL (ref 0.2–1.2)
Total Protein: 6.8 g/dL (ref 6.0–8.3)

## 2022-08-14 LAB — IBC + FERRITIN
Ferritin: 25 ng/mL (ref 22.0–322.0)
Iron: 108 ug/dL (ref 42–165)
Saturation Ratios: 28.9 % (ref 20.0–50.0)
TIBC: 373.8 ug/dL (ref 250.0–450.0)
Transferrin: 267 mg/dL (ref 212.0–360.0)

## 2022-08-14 LAB — CBC WITH DIFFERENTIAL/PLATELET
Basophils Absolute: 0.1 10*3/uL (ref 0.0–0.1)
Basophils Relative: 0.9 % (ref 0.0–3.0)
Eosinophils Absolute: 0.2 10*3/uL (ref 0.0–0.7)
Eosinophils Relative: 1.9 % (ref 0.0–5.0)
HCT: 40.5 % (ref 39.0–52.0)
Hemoglobin: 13 g/dL (ref 13.0–17.0)
Lymphocytes Relative: 19 % (ref 12.0–46.0)
Lymphs Abs: 1.7 10*3/uL (ref 0.7–4.0)
MCHC: 32.2 g/dL (ref 30.0–36.0)
MCV: 87.1 fl (ref 78.0–100.0)
Monocytes Absolute: 1.1 10*3/uL — ABNORMAL HIGH (ref 0.1–1.0)
Monocytes Relative: 12.3 % — ABNORMAL HIGH (ref 3.0–12.0)
Neutro Abs: 5.9 10*3/uL (ref 1.4–7.7)
Neutrophils Relative %: 65.9 % (ref 43.0–77.0)
Platelets: 167 10*3/uL (ref 150.0–400.0)
RBC: 4.65 Mil/uL (ref 4.22–5.81)
RDW: 19.2 % — ABNORMAL HIGH (ref 11.5–15.5)
WBC: 8.9 10*3/uL (ref 4.0–10.5)

## 2022-08-14 LAB — HEMOGLOBIN A1C: Hgb A1c MFr Bld: 6.8 % — ABNORMAL HIGH (ref 4.6–6.5)

## 2022-08-14 LAB — TSH: TSH: 3.09 u[IU]/mL (ref 0.35–5.50)

## 2022-08-14 LAB — VITAMIN D 25 HYDROXY (VIT D DEFICIENCY, FRACTURES): VITD: 48.38 ng/mL (ref 30.00–100.00)

## 2022-08-14 LAB — T4, FREE: Free T4: 1.07 ng/dL (ref 0.60–1.60)

## 2022-08-14 MED ORDER — ACCU-CHEK SOFTCLIX LANCETS MISC: 3 refills | Status: DC

## 2022-08-14 NOTE — Progress Notes (Signed)
Patient ID: Dominic Kane, male    DOB: 1940/11/25, 82 y.o.   MRN: [id number]  This visit was conducted in person.  BP 120/70 (BP Location: Right Arm, Patient Position: Sitting, Cuff Size: Large)   Pulse (!) 58   Temp 98 F (36.7 C) (Temporal)   Ht 5\' 10"  (1.778 m)   Wt 216 lb 4 oz (98.1 kg)   SpO2 95%   BMI 31.03 kg/m    CC:  Chief Complaint  Patient presents with   Fatigue    "Extreme" since he got out of the hospital in December    Subjective:   HPI: Dominic Kane is a 82 y.o. male presenting on 08/14/2022 for Fatigue ("Extreme" since he got out of the hospital in December)  He reports extreme fatigue since he got out of the hospital in December 2023. Reviewed hospital discharge summary Admitted for lower GI bleed secondary to severe diverticulosis resulting in acute blood loss  Since then he has been followed by Dr. 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 Recommend annual imaging followup by CTA or MRA. This recommendation follows 2010 ACCF/AHA/AATS/ACR/ASA/SCA/SCAI/SIR/STS/SVM Guidelines for the Diagnosis and Management of Patients with Thoracic Aortic Disease. Circulation. 2010; 121: Y865-H846. 2. Scattered aortic and coronary artery atherosclerotic vascular calcifications. 3. Stable dilation of the aortic root to a maximal diameter of 4.5 cm measured at the sinuses of Valsalva. Thickened and calcified aortic valve.   He wakes in AM feeling tired, able to do things for a few hours, then has to sit down.  Did over extend himself yesterday... felt pulling in upper chest wall.  Mild SOB fairly stable.  No blood in stool.[name] on iron so it is darker.  He describes he has less endurance.   Falls asleep well at night.. 8 hours.. using melatonin.   He does snore at  night. Has morning headache.  Denies depression, he is frustrated with fatigue.  Relevant past medical, surgical, family and social history reviewed and updated as indicated. Interim medical history since our last visit reviewed. Allergies and medications reviewed and updated. Outpatient Medications Prior to Visit  Medication Sig Dispense Refill   acetaminophen (TYLENOL) 500 MG tablet Take 1,000 mg by mouth as needed for moderate pain.     aspirin EC 81 MG tablet Take 1 tablet (81 mg total) by mouth daily. Swallow whole. 30 tablet 12   cetirizine (ZYRTEC) 10 MG tablet Take 10 mg by mouth as needed for allergies.     Chlorpheniramine-DM (CORICIDIN HBP COUGH/COLD PO) Take 1 tablet by mouth daily as needed (congestion).     Cholecalciferol (VITAMIN D-3 PO) Take 1 capsule by mouth at bedtime.     ferrous sulfate 325 (65 FE) MG EC tablet 1 tablet with food every other day 100 tablet 3   finasteride (PROSCAR) 5 MG tablet Take 1 tablet (5 mg total) by mouth daily. 90 tablet 3   furosemide (LASIX) 40 MG tablet Take 40 mg by mouth daily as needed.     levothyroxine (SYNTHROID) 150 MCG tablet TAKE 1 TABLET BY MOUTH EVERY DAY BEFORE BREAKFAST 90 tablet 3   Melatonin 5 MG CAPS Take 5 mg by mouth at bedtime.     Multiple Vitamins-Minerals (CENTRUM SILVER 50+MEN PO) Take 1 tablet by mouth at bedtime.  Multiple Vitamins-Minerals (PRESERVISION AREDS 2 PO) Take 1 tablet by mouth in the morning and at bedtime.     nitroGLYCERIN (NITROSTAT) 0.4 MG SL tablet Place 1 tablet (0.4 mg total) under the tongue every 5 (five) minutes as needed for chest pain. 25 tablet 3   omeprazole (PRILOSEC) 20 MG capsule Take 1 capsule (20 mg total) by mouth daily. 90 capsule 3   polyethylene glycol (MIRALAX / GLYCOLAX) 17 g packet Take 17 g by mouth daily.     PRESCRIPTION MEDICATION every 6 (six) months. *Antibiotic Injection in Eye*     ramipril (ALTACE) 2.5 MG capsule TAKE 1 CAPSULE BY MOUTH EVERY DAY 90 capsule 3    rosuvastatin (CRESTOR) 10 MG tablet Take 1 tablet (10 mg total) by mouth daily. 90 tablet 3   terazosin (HYTRIN) 10 MG capsule Take 10 mg by mouth at bedtime.     furosemide (LASIX) 40 MG tablet TAKE 1 TABLET BY MOUTH  DAILY (DUE FOR AN APPT IN  APRIL AND CAN GET YEARLY  SUPPLY AT THAT VISIT.   PLEASE CALL TO SCHEDULE) (Patient taking differently: Take 40 mg by mouth daily.) 90 tablet 3   polyethylene glycol (MIRALAX / GLYCOLAX) 17 g packet Take 17 g by mouth 2 (two) times daily. For constipation. Also available OTC. (Patient taking differently: Take 17 g by mouth daily. For constipation. Also available OTC.) 60 each 3   terazosin (HYTRIN) 10 MG capsule Take 1 capsule (10 mg total) by mouth daily. (Patient taking differently: Take 10 mg by mouth at bedtime.) 90 capsule 3   benzonatate (TESSALON) 200 MG capsule Take 1 capsule (200 mg total) by mouth 2 (two) times daily as needed for cough. (Patient not taking: Reported on 05/02/2022) 20 capsule 0   No facility-administered medications prior to visit.     Per HPI unless specifically indicated in ROS section below Review of Systems  Constitutional:  Positive for fatigue. Negative for fever.  HENT:  Negative for ear pain.   Eyes:  Negative for pain.  Respiratory:  Negative for cough and shortness of breath.   Cardiovascular:  Negative for chest pain, palpitations and leg swelling.  Gastrointestinal:  Negative for abdominal pain.  Genitourinary:  Negative for dysuria.  Musculoskeletal:  Negative for arthralgias.  Neurological:  Negative for syncope, light-headedness and headaches.  Psychiatric/Behavioral:  Negative for dysphoric mood.    Objective:  BP 120/70 (BP Location: Right Arm, Patient Position: Sitting, Cuff Size: Large)   Pulse (!) 58   Temp 98 F (36.7 C) (Temporal)   Ht 5\' 10"  (1.778 m)   Wt 216 lb 4 oz (98.1 kg)   SpO2 95%   BMI 31.03 kg/m   Wt Readings from Last 3 Encounters:  08/14/22 216 lb 4 oz (98.1 kg)  05/02/22 214 lb  3.2 oz (97.2 kg)  04/28/22 214 lb (97.1 kg)      Physical Exam Constitutional:      Appearance: He is well-developed.  HENT:     Head: Normocephalic.     Right Ear: Hearing normal.     Left Ear: Hearing normal.     Nose: Nose normal.  Neck:     Thyroid: No thyroid mass or thyromegaly.     Vascular: No carotid bruit.     Trachea: Trachea normal.  Cardiovascular:     Rate and Rhythm: Normal rate and regular rhythm.     Pulses: Normal pulses.     Heart sounds: Heart sounds not distant. No murmur  heard.    No friction rub. No gallop.     Comments: No peripheral edema Pulmonary:     Effort: Pulmonary effort is normal. No respiratory distress.     Breath sounds: Normal breath sounds.  Skin:    General: Skin is warm and dry.     Findings: No rash.  Psychiatric:        Speech: Speech normal.        Behavior: Behavior normal.        Thought Content: Thought content normal.       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  Likely multifactorial. In part due to deconditioning, heart failure reduced ejection fraction and  other chronic comorbidities. Will evaluate with labs to rule out remedy able cause such as worsening thyroid function, recurrent anemia, electrolyte imbalance or vitamin deficiency. Also noted patient with snoring and morning headaches.  In the setting of fatigue this could suggest a diagnosis of sleep apnea.  He has never been tested for this.  Will refer for sleep study evaluation.  Orders: -     Ambulatory referral to Pulmonology -     VITAMIN D 25 Hydroxy (Vit-D Deficiency, Fractures) -     Hemoglobin A1c -     CBC with Differential/Platelet -     Comprehensive metabolic panel -     IBC + Ferritin -     Vitamin B12 -     T4, free -     T3, free -     TSH    No follow-ups on file.   Kerby Nora, MD

## 2022-08-14 NOTE — Assessment & Plan Note (Signed)
No chronic persistent ongoing since hospitalization December 2023.  Likely multifactorial. In part due to deconditioning, heart failure reduced ejection fraction and other chronic comorbidities. Will evaluate with labs to rule out remedy able cause such as worsening thyroid function, recurrent anemia, electrolyte imbalance or vitamin deficiency. Also noted patient with snoring and morning headaches.  In the setting of fatigue this could suggest a diagnosis of sleep apnea.  He has never been tested for this.  Will refer for sleep study evaluation.

## 2022-08-15 ENCOUNTER — Other Ambulatory Visit: Payer: Self-pay | Admitting: Family Medicine

## 2022-08-15 ENCOUNTER — Telehealth: Payer: Self-pay | Admitting: *Deleted

## 2022-08-15 DIAGNOSIS — E875 Hyperkalemia: Secondary | ICD-10-CM

## 2022-08-15 NOTE — Telephone Encounter (Signed)
Future labs orders for BMET.

## 2022-08-15 NOTE — Addendum Note (Signed)
Addended by: Alvina Chou on: 08/15/2022 12:29 PM   Modules accepted: Orders

## 2022-08-21 ENCOUNTER — Other Ambulatory Visit (INDEPENDENT_AMBULATORY_CARE_PROVIDER_SITE_OTHER): Payer: PPO

## 2022-08-21 DIAGNOSIS — E875 Hyperkalemia: Secondary | ICD-10-CM | POA: Diagnosis not present

## 2022-08-21 NOTE — Addendum Note (Signed)
Addended by: Shon Millet on: 08/21/2022 03:30 PM   Modules accepted: Orders

## 2022-08-22 LAB — BASIC METABOLIC PANEL
BUN/Creatinine Ratio: 23 (calc) — ABNORMAL HIGH (ref 6–22)
BUN: 26 mg/dL — ABNORMAL HIGH (ref 7–25)
CO2: 22 mmol/L (ref 20–32)
Calcium: 9.2 mg/dL (ref 8.6–10.3)
Chloride: 104 mmol/L (ref 98–110)
Creat: 1.14 mg/dL (ref 0.70–1.22)
Glucose, Bld: 109 mg/dL — ABNORMAL HIGH (ref 65–99)
Potassium: 4.4 mmol/L (ref 3.5–5.3)
Sodium: 140 mmol/L (ref 135–146)

## 2022-09-01 ENCOUNTER — Encounter (INDEPENDENT_AMBULATORY_CARE_PROVIDER_SITE_OTHER): Payer: PPO | Admitting: Ophthalmology

## 2022-09-01 DIAGNOSIS — H33303 Unspecified retinal break, bilateral: Secondary | ICD-10-CM

## 2022-09-01 DIAGNOSIS — H35033 Hypertensive retinopathy, bilateral: Secondary | ICD-10-CM

## 2022-09-01 DIAGNOSIS — I10 Essential (primary) hypertension: Secondary | ICD-10-CM

## 2022-09-01 DIAGNOSIS — H34813 Central retinal vein occlusion, bilateral, with macular edema: Secondary | ICD-10-CM | POA: Diagnosis not present

## 2022-09-01 DIAGNOSIS — H353132 Nonexudative age-related macular degeneration, bilateral, intermediate dry stage: Secondary | ICD-10-CM | POA: Diagnosis not present

## 2022-09-01 DIAGNOSIS — H43813 Vitreous degeneration, bilateral: Secondary | ICD-10-CM | POA: Diagnosis not present

## 2022-09-26 ENCOUNTER — Telehealth: Payer: Self-pay | Admitting: *Deleted

## 2022-09-26 NOTE — Telephone Encounter (Signed)
Dominic Kane had labs drawn May 2024.  Please review and let me know if patient needs to come in for any additional CPE labs.

## 2022-09-26 NOTE — Telephone Encounter (Signed)
-----   Message from Alvina Chou sent at 09/26/2022 12:21 PM EDT ----- Regarding: Lab orders for Thursday, 7.18.24 Patient is scheduled for CPX labs, please order future labs, Thanks , Camelia Eng

## 2022-09-26 NOTE — Telephone Encounter (Signed)
Mr. Tatge notified by telephone that no pre physical labs are needed at this time.  Lab appointment cancelled.

## 2022-10-06 ENCOUNTER — Encounter (INDEPENDENT_AMBULATORY_CARE_PROVIDER_SITE_OTHER): Payer: PPO | Admitting: Ophthalmology

## 2022-10-06 DIAGNOSIS — H35033 Hypertensive retinopathy, bilateral: Secondary | ICD-10-CM | POA: Diagnosis not present

## 2022-10-06 DIAGNOSIS — H353112 Nonexudative age-related macular degeneration, right eye, intermediate dry stage: Secondary | ICD-10-CM

## 2022-10-06 DIAGNOSIS — I10 Essential (primary) hypertension: Secondary | ICD-10-CM | POA: Diagnosis not present

## 2022-10-06 DIAGNOSIS — H35372 Puckering of macula, left eye: Secondary | ICD-10-CM | POA: Diagnosis not present

## 2022-10-06 DIAGNOSIS — H33303 Unspecified retinal break, bilateral: Secondary | ICD-10-CM

## 2022-10-06 DIAGNOSIS — H34813 Central retinal vein occlusion, bilateral, with macular edema: Secondary | ICD-10-CM | POA: Diagnosis not present

## 2022-10-08 ENCOUNTER — Encounter: Payer: Self-pay | Admitting: Primary Care

## 2022-10-08 ENCOUNTER — Ambulatory Visit: Payer: PPO | Admitting: Primary Care

## 2022-10-08 VITALS — BP 134/64 | HR 66 | Ht 72.0 in | Wt 215.2 lb

## 2022-10-08 DIAGNOSIS — R0681 Apnea, not elsewhere classified: Secondary | ICD-10-CM | POA: Diagnosis not present

## 2022-10-08 DIAGNOSIS — I509 Heart failure, unspecified: Secondary | ICD-10-CM

## 2022-10-08 DIAGNOSIS — R0683 Snoring: Secondary | ICD-10-CM | POA: Diagnosis not present

## 2022-10-08 NOTE — Patient Instructions (Addendum)
Sleep apnea is defined as period of 10 seconds or longer when you stop breathing at night. This can happen multiple times a night. Dx sleep apnea is when this occurs more than 5 times an hour.    Mild OSA 5-15 apneic events an hour Moderate OSA 15-30 apneic events an hour Severe OSA > 30 apneic events an hour   Untreated sleep apnea puts you at higher risk for cardiac arrhythmias, pulmonary HTN, stroke and diabetes  Treatment options include weight loss, side sleeping position, oral appliance, CPAP therapy or referral to ENT for possible surgical options    Recommendations: Focus on side sleeping position or elevate head with wedge pillow 30 degrees Work on weight loss efforts if able  Do not drive if experiencing excessive daytime sleepiness of fatigue    Orders: In lab sleep study re: loud snoring (ordered)   Follow-up: Please call to schedule follow-up 1-2 weeks after completing home sleep study to review results and treatment if needed (can be virtual)

## 2022-10-08 NOTE — Progress Notes (Signed)
@Patient  ID: Dominic Kane, male    DOB: 10-Apr-1940, 82 y.o.   MRN: [id number]  Chief Complaint  Patient presents with   Consult    [facility] 2     Referring provider: Excell Seltzer, MD  HPI: 82 year old male, former smoker.  Past medical history significant for myopathy, CHF, hypertension, transient hypotension, thoracic aortic aneurysm, GERD, hypothyroidism, papillary thyroid cancer, per lipidemia, iron deficiency anemia, vitamin D deficiency.   10/08/2022 - Interim hx  Patient presents today for sleep consult. No known history of sleep apnea. He had a sleep study 20-25 years ago that was reported negative. He has never been on CPAP. Wife has noted patient snores loudly at night along with abnormal breathing pattern and gasping for air in his sleep. He feels he sleeps well at night. He feels rested when he wakes up. He does take a nap a couple times a week. His wife has noticed him fall asleep watching tv or when inactive.    Allergies  Allergen Reactions   Sulfonamide Derivatives Hives and Nausea And Vomiting   Codeine Nausea Only    Immunization History  Administered Date(s) Administered   Fluad Quad(high Dose 65+) 11/17/2018   Influenza Whole 12/23/2006, 12/09/2007, 12/22/2008   Influenza, High Dose Seasonal PF 11/17/2014, 12/12/2015, 12/18/2016, 12/22/2017, 12/07/2019, 11/07/2020, 10/31/2021   Influenza,inj,Quad PF,6+ Mos 01/06/2013   Influenza-Unspecified 11/22/2013   Moderna Covid-19 Vaccine Bivalent Booster 12yrs & up 01/22/2021   Moderna Sars-Covid-2 Vaccination 04/06/2019, 05/04/2019, 02/20/2020   PNEUMOCOCCAL CONJUGATE-20 11/07/2020   Pneumococcal Conjugate-13 10/12/2014   Pneumococcal Polysaccharide-23 12/23/2006   Td 03/24/2001   Tdap 11/17/2014   Zoster Recombinant(Shingrix) 01/02/2022, 04/25/2022   Zoster, Live 06/23/2014    Past Medical History:  Diagnosis Date   Abnormality of gait    Acute sinusitis, unspecified    Allergy    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 15 yrs ago x1   Hypertrophy of prostate without urinary obstruction and other lower urinary tract symptoms (LUTS)    Lipoprotein deficiencies    Other and unspecified hyperlipidemia    Other primary cardiomyopathies    Pain in joint, lower leg    Personal history of colonic polyps    Personal history of other diseases of digestive system    Pes anserinus tendinitis or bursitis    left shoulder remains an issue   Sinoatrial node dysfunction (HCC)    Dr. 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    Thoracic or lumbosacral neuritis or radiculitis, unspecified    Unspecified disorder of skin and subcutaneous tissue    Unspecified essential hypertension    Unspecified vitamin D deficiency     Tobacco History:  Social History   Tobacco Use  Smoking Status Former   Current packs/day: 0.00   Types: Cigarettes   Quit date: 07/03/1980   Years since quitting: 42.3  Smokeless Tobacco Never   Counseling given: Not Answered   Outpatient Medications Prior to Visit  Medication Sig Dispense Refill   Accu-Chek Softclix Lancets lancets Use to check blood sugar once daily 100 each 3   acetaminophen (TYLENOL) 500 MG tablet Take 1,000 mg by mouth  as needed for moderate pain.     aspirin EC 81 MG tablet Take 1 tablet (81 mg total) by mouth daily. Swallow whole. 30 tablet 12   cetirizine (ZYRTEC) 10 MG tablet Take 10 mg by mouth as needed for allergies.     Chlorpheniramine-DM (CORICIDIN HBP COUGH/COLD PO) Take 1 tablet by mouth daily as needed (congestion).     Cholecalciferol (VITAMIN D-3 PO) Take 1 capsule by mouth at bedtime.     ferrous sulfate 325 (65 FE) MG EC tablet 1 tablet with food every other day 100 tablet 3   furosemide (LASIX) 40 MG tablet Take 40 mg by mouth daily as needed.     levothyroxine (SYNTHROID) 150 MCG tablet TAKE 1 TABLET BY MOUTH EVERY DAY BEFORE BREAKFAST 90 tablet 3   Melatonin 5 MG CAPS Take 5 mg by mouth at bedtime.     Multiple Vitamins-Minerals (CENTRUM SILVER 50+MEN PO) Take 1 tablet by mouth at bedtime.     Multiple Vitamins-Minerals (PRESERVISION AREDS 2 PO) Take 1 tablet by mouth in the morning and at bedtime.     nitroGLYCERIN (NITROSTAT) 0.4 MG SL tablet Place 1 tablet (0.4 mg total) under the tongue every 5 (five) minutes as needed for chest pain. 25 tablet 3   omeprazole (PRILOSEC) 20 MG capsule Take 1 capsule (20 mg total) by mouth daily. 90 capsule 3   polyethylene glycol (MIRALAX / GLYCOLAX) 17 g packet Take 17 g by mouth daily.     PRESCRIPTION MEDICATION every 6 (six) months. *Antibiotic Injection in Eye*     ramipril (ALTACE) 2.5 MG capsule TAKE 1 CAPSULE BY MOUTH EVERY DAY 90 capsule 3   rosuvastatin (CRESTOR) 10 MG tablet Take 1 tablet (10 mg total) by mouth daily. 90 tablet 3   terazosin (HYTRIN) 10 MG capsule Take 10 mg by mouth at bedtime.     finasteride (PROSCAR) 5 MG tablet Take 1 tablet (5 mg total) by mouth daily. 90 tablet 3   No facility-administered medications prior to visit.      Review of Systems  Review of Systems  Constitutional: Negative.  Negative for fatigue.  HENT: Negative.    Respiratory: Negative.    Psychiatric/Behavioral:  Positive for sleep disturbance.       Physical Exam  BP 134/64 (BP Location: Left Arm, Patient Position: Sitting, Cuff Size: Normal)   Pulse 66   Ht 6' (1.829 m)   Wt 215 lb 3.2 oz (97.6 kg)   SpO2 94%   BMI 29.19 kg/m  Physical Exam Constitutional:      Appearance: Normal appearance.  HENT:     Head: Normocephalic and atraumatic.  Cardiovascular:     Rate and Rhythm: Normal rate and regular rhythm.  Pulmonary:     Effort: Pulmonary effort is normal.     Breath sounds: Normal breath sounds.  Skin:    General: Skin is warm and dry.  Neurological:     General: No focal deficit present.     Mental Status: He is alert  and oriented to person, place, and time. Mental status is at baseline.  Psychiatric:        Mood and Affect: Mood normal.        Behavior: Behavior normal.        Thought Content: Thought content normal.        Judgment: Judgment normal.      Lab Results:  CBC    Component Value Date/Time   WBC 8.9 08/14/2022 1129   RBC 4.65 08/14/2022 1129   HGB 13.0 08/14/2022 1129   HCT 40.5 08/14/2022 1129   PLT 167.0 08/14/2022 1129   MCV 87.1 08/14/2022 1129   MCH 31.0 03/06/2022 0021   MCHC 32.2 08/14/2022 1129   RDW 19.2 (H) 08/14/2022 1129   LYMPHSABS 1.7 08/14/2022 1129   MONOABS 1.1 (H) 08/14/2022 1129   EOSABS 0.2 08/14/2022 1129   BASOSABS 0.1 08/14/2022 1129    BMET    Component Value Date/Time   NA 140 08/21/2022 1530   NA 141 07/18/2022 0839   K 4.4 08/21/2022 1530   CL 104 08/21/2022 1530   CO2 22 08/21/2022 1530   GLUCOSE 109 (H) 08/21/2022 1530   BUN 26 (H) 08/21/2022 1530   BUN 19 07/18/2022 0839   CREATININE 1.14 08/21/2022 1530   CALCIUM 9.2 08/21/2022 1530   GFRNONAA >60 03/07/2022 0636   GFRAA 81 07/20/2019 0913    BNP    Component Value Date/Time   BNP 149.7 (H) 03/01/2022 1600    ProBNP No results found for: "PROBNP"  Imaging: No results found.   Assessment & Plan:   Loud snoring - Patient has symptoms of loud snoring, abnormal breathing  pattern and waking up gasping for air in his sleep. Suspicion patient may have underlying obstructive sleep apnea, needs polysomnography in-lab due to hx CHF.  We reviewed risks of untreated sleep apnea including cardiac arrhythmias, pulmonary hypertension, diabetes and stroke.  We also discussed treatment options including weight loss, oral appliance, CPAP therapy or referral to ENT for possible surgical options.     Glenford Bayley, NP 10/13/2022

## 2022-10-09 ENCOUNTER — Other Ambulatory Visit: Payer: Self-pay | Admitting: *Deleted

## 2022-10-09 ENCOUNTER — Other Ambulatory Visit: Payer: PPO

## 2022-10-09 MED ORDER — FINASTERIDE 5 MG PO TABS: 5.0000 mg | ORAL_TABLET | Freq: Every day | ORAL | 3 refills | Status: DC

## 2022-10-13 NOTE — Assessment & Plan Note (Signed)
-   Patient has symptoms of loud snoring, abnormal breathing pattern and waking up gasping for air in his sleep. Suspicion patient may have underlying obstructive sleep apnea, needs polysomnography in-lab due to hx CHF.  We reviewed risks of untreated sleep apnea including cardiac arrhythmias, pulmonary hypertension, diabetes and stroke.  We also discussed treatment options including weight loss, oral appliance, CPAP therapy or referral to ENT for possible surgical options.

## 2022-10-15 DIAGNOSIS — E890 Postprocedural hypothyroidism: Secondary | ICD-10-CM | POA: Diagnosis not present

## 2022-10-15 DIAGNOSIS — E7800 Pure hypercholesterolemia, unspecified: Secondary | ICD-10-CM | POA: Diagnosis not present

## 2022-10-15 DIAGNOSIS — E1165 Type 2 diabetes mellitus with hyperglycemia: Secondary | ICD-10-CM | POA: Diagnosis not present

## 2022-10-16 ENCOUNTER — Ambulatory Visit (INDEPENDENT_AMBULATORY_CARE_PROVIDER_SITE_OTHER): Payer: PPO | Admitting: Family Medicine

## 2022-10-16 ENCOUNTER — Encounter: Payer: Self-pay | Admitting: Family Medicine

## 2022-10-16 VITALS — BP 128/64 | HR 63 | Temp 97.7°F | Resp 16 | Ht 69.75 in | Wt 213.4 lb

## 2022-10-16 DIAGNOSIS — Z0000 Encounter for general adult medical examination without abnormal findings: Secondary | ICD-10-CM | POA: Diagnosis not present

## 2022-10-16 DIAGNOSIS — I5022 Chronic systolic (congestive) heart failure: Secondary | ICD-10-CM | POA: Diagnosis not present

## 2022-10-16 DIAGNOSIS — C73 Malignant neoplasm of thyroid gland: Secondary | ICD-10-CM

## 2022-10-16 DIAGNOSIS — I509 Heart failure, unspecified: Secondary | ICD-10-CM | POA: Diagnosis not present

## 2022-10-16 DIAGNOSIS — E1140 Type 2 diabetes mellitus with diabetic neuropathy, unspecified: Secondary | ICD-10-CM | POA: Diagnosis not present

## 2022-10-16 DIAGNOSIS — E1159 Type 2 diabetes mellitus with other circulatory complications: Secondary | ICD-10-CM

## 2022-10-16 DIAGNOSIS — I429 Cardiomyopathy, unspecified: Secondary | ICD-10-CM

## 2022-10-16 DIAGNOSIS — E11311 Type 2 diabetes mellitus with unspecified diabetic retinopathy with macular edema: Secondary | ICD-10-CM

## 2022-10-16 DIAGNOSIS — I152 Hypertension secondary to endocrine disorders: Secondary | ICD-10-CM

## 2022-10-16 DIAGNOSIS — E1340 Other specified diabetes mellitus with diabetic neuropathy, unspecified: Secondary | ICD-10-CM

## 2022-10-16 DIAGNOSIS — I712 Thoracic aortic aneurysm, without rupture, unspecified: Secondary | ICD-10-CM | POA: Diagnosis not present

## 2022-10-16 DIAGNOSIS — E11319 Type 2 diabetes mellitus with unspecified diabetic retinopathy without macular edema: Secondary | ICD-10-CM

## 2022-10-16 LAB — MICROALBUMIN / CREATININE URINE RATIO
Creatinine,U: 80.8 mg/dL
Microalb Creat Ratio: 1.2 mg/g (ref 0.0–30.0)
Microalb, Ur: 1 mg/dL (ref 0.0–1.9)

## 2022-10-16 LAB — HM DIABETES FOOT EXAM

## 2022-10-16 NOTE — Assessment & Plan Note (Signed)
Euvolemic on lasix.  On ACEI.

## 2022-10-16 NOTE — Assessment & Plan Note (Addendum)
Chronic, followed by cardiology  Most recent echo showed May 13, 2022 EF 35 to 40%, stable, no valvular disease.  Lasix 40 mg p.o. daily as needed Ramipril 2.5 mg daily

## 2022-10-16 NOTE — Assessment & Plan Note (Signed)
CTA to evaluate thoracic aortic aneurysm 07/27/2022 showed stable changes

## 2022-10-16 NOTE — Assessment & Plan Note (Signed)
Chronic, followed by cardiology

## 2022-10-16 NOTE — Assessment & Plan Note (Signed)
Chronic, Due to DM, minimally symptomatic.

## 2022-10-16 NOTE — Assessment & Plan Note (Signed)
Chronic, followed by ophthalmology.

## 2022-10-16 NOTE — Progress Notes (Signed)
Patient ID: Dominic Kane, male    DOB: 01-30-1941, 82 y.o.   MRN: [id number]  This visit was conducted in person.  BP 128/64   Pulse 63   Temp 97.7 F (36.5 C)   Resp 16   Ht 5' 9.75" (1.772 m)   Wt 213 lb 6 oz (96.8 kg)   SpO2 94%   BMI 30.84 kg/m    CC:   Annual   Subjective:   HPI: Dominic Kane is a 82 y.o. male presenting on 10/16/2022 for  Annual  The patient presents for complete physical and review of chronic health problems. He/She also has the following acute concerns today:   The patient saw a LPN or RN for medicare wellness visit. 04/28/22  Prevention and wellness was reviewed in detail. Note reviewed and important notes copied below.   Diabetes:  Diet controlled   10/15/22 6.8 Lab Results  Component Value Date   HGBA1C 6.8 (H) 08/14/2022  Using medications without difficulties: Hypoglycemic episodes:none Hyperglycemic episodes: none Feet problems: no ulcers Blood Sugars averaging:  FBS  95-123 eye exam within last year: yes  Associated with neuropathy and retinopathy  Hypertension:    Stable control on low dose rampiril BP Readings from Last 3 Encounters:  10/16/22 128/64  10/08/22 134/64  08/14/22 120/70  Using medication without problems or lightheadedness:  occ Chest pain with exertion: none Edema: none Short of breath: mild Average home BPs: 116-122/76-87 Other issues:  Elevated Cholesterol:  LD at goal  < 70 on Crestor 10 mg daily  [facility] cholesterol from 7.24.2024 showed total 131, trig 148, HDl 37 LDL 68 Lab Results  Component Value Date   CHOL 127 10/22/2021   HDL 35.90 (L) 10/22/2021   LDLCALC 72 10/22/2021   TRIG 96.0 10/22/2021   CHOLHDL 4 10/22/2021  Using medications without problems: Muscle aches:  Diet compliance: moderate Exercise:  walking daily Other complaints:  Cardiomyopathy , TAA, CAD and CHF followed by cardiology. Reviewed last office visit note from Jari Favre, [place] May 02, 2022 Most recent  echo showed May 13, 2022 EF 35 to 40%, stable, no valvular disease. CTA to evaluate thoracic aortic aneurysm 07/27/2022 showed stable changes  Hypothyroid and papillary thyroid care followed by Dr.  Talmage Nap... he wishes to have me follow it.    Relevant past medical, surgical, family and social history reviewed and updated as indicated. Interim medical history since our last visit reviewed. Allergies and medications reviewed and updated. Outpatient Medications Prior to Visit  Medication Sig Dispense Refill   Accu-Chek Softclix Lancets lancets Use to check blood sugar once daily 100 each 3   acetaminophen (TYLENOL) 500 MG tablet Take 1,000 mg by mouth as needed for moderate pain.     aspirin EC 81 MG tablet Take 1 tablet (81 mg total) by mouth daily. Swallow whole. 30 tablet 12   cetirizine (ZYRTEC) 10 MG tablet Take 10 mg by mouth as needed for allergies.     Chlorpheniramine-DM (CORICIDIN HBP COUGH/COLD PO) Take 1 tablet by mouth daily as needed (congestion).     Cholecalciferol (VITAMIN D-3 PO) Take 1 capsule by mouth at bedtime.     ciprofloxacin (CILOXAN) 0.3 % ophthalmic solution Place 2 drops into both eyes as needed.     ferrous sulfate 325 (65 FE) MG EC tablet 1 tablet with food every other day 100 tablet 3   finasteride (PROSCAR) 5 MG tablet Take 1 tablet (5 mg total) by mouth daily. 90  tablet 3   furosemide (LASIX) 40 MG tablet Take 40 mg by mouth daily as needed.     levothyroxine (SYNTHROID) 150 MCG tablet TAKE 1 TABLET BY MOUTH EVERY DAY BEFORE BREAKFAST 90 tablet 3   Melatonin 5 MG CAPS Take 5 mg by mouth at bedtime.     Multiple Vitamins-Minerals (CENTRUM SILVER 50+MEN PO) Take 1 tablet by mouth at bedtime.     Multiple Vitamins-Minerals (PRESERVISION AREDS 2 PO) Take 1 tablet by mouth in the morning and at bedtime.     nitroGLYCERIN (NITROSTAT) 0.4 MG SL tablet Place 1 tablet (0.4 mg total) under the tongue every 5 (five) minutes as needed for chest pain. 25 tablet 3    omeprazole (PRILOSEC) 20 MG capsule Take 1 capsule (20 mg total) by mouth daily. 90 capsule 3   polyethylene glycol (MIRALAX / GLYCOLAX) 17 g packet Take 17 g by mouth daily.     PRESCRIPTION MEDICATION every 6 (six) months. *Antibiotic Injection in Eye*     ramipril (ALTACE) 2.5 MG capsule TAKE 1 CAPSULE BY MOUTH EVERY DAY 90 capsule 3   rosuvastatin (CRESTOR) 10 MG tablet Take 1 tablet (10 mg total) by mouth daily. 90 tablet 3   terazosin (HYTRIN) 10 MG capsule Take 10 mg by mouth at bedtime.     No facility-administered medications prior to visit.     Per HPI unless specifically indicated in ROS section below Review of Systems  Constitutional:  Positive for fever. Negative for fatigue.  HENT:  Positive for sinus pressure and sore throat. Negative for ear pain.   Eyes:  Negative for pain.  Respiratory:  Positive for cough. Negative for shortness of breath.   Cardiovascular:  Negative for chest pain, palpitations and leg swelling.  Gastrointestinal:  Negative for abdominal pain.  Genitourinary:  Negative for dysuria.  Musculoskeletal:  Negative for arthralgias.  Neurological:  Negative for syncope, light-headedness and headaches.  Psychiatric/Behavioral:  Negative for dysphoric mood.    Objective:  BP 128/64   Pulse 63   Temp 97.7 F (36.5 C)   Resp 16   Ht 5' 9.75" (1.772 m)   Wt 213 lb 6 oz (96.8 kg)   SpO2 94%   BMI 30.84 kg/m   Wt Readings from Last 3 Encounters:  10/16/22 213 lb 6 oz (96.8 kg)  10/08/22 215 lb 3.2 oz (97.6 kg)  08/14/22 216 lb 4 oz (98.1 kg)      Physical Exam Constitutional:      Appearance: He is well-developed. He is not ill-appearing or toxic-appearing.  HENT:     Head: Normocephalic.     Right Ear: Hearing normal.     Left Ear: Hearing normal.     Nose: Congestion present.     Mouth/Throat:     Pharynx: No oropharyngeal exudate or posterior oropharyngeal erythema.  Eyes:     Conjunctiva/sclera: Conjunctivae normal.  Neck:     Thyroid:  No thyroid mass or thyromegaly.     Vascular: No carotid bruit.     Trachea: Trachea normal.  Cardiovascular:     Rate and Rhythm: Normal rate and regular rhythm.     Pulses: Normal pulses.     Heart sounds: Heart sounds not distant. No murmur heard.    No friction rub. No gallop.     Comments: No peripheral edema Pulmonary:     Effort: Pulmonary effort is normal. No respiratory distress.     Breath sounds: Normal breath sounds.  Skin:  General: Skin is warm and dry.     Findings: No rash.  Psychiatric:        Speech: Speech normal.        Behavior: Behavior normal.        Thought Content: Thought content normal.    Diabetic foot exam: Abnormal inspection No skin breakdown  Pre ulcerative calluses  Normal DP pulses Decreased  sensation to light touch and monofilament Nails thickened     Results for orders placed or performed in visit on 10/16/22  HM DIABETES FOOT EXAM  Result Value Ref Range   HM Diabetic Foot Exam done     This visit occurred during the SARS-CoV-2 public health emergency.  Safety protocols were in place, including screening questions prior to the visit, additional usage of staff PPE, and extensive cleaning of exam room while observing appropriate contact time as indicated for disinfecting solutions.   COVID 19 screen:  No recent travel or known exposure to COVID19 The patient denies respiratory symptoms of COVID 19 at this time. The importance of social distancing was discussed today.   Assessment and Plan The patient's preventative maintenance and recommended screening tests for an annual wellness exam were reviewed in full today. Brought up to date unless services declined.  Counselled on the importance of diet, exercise, and its role in overall health and mortality. The patient's FH and SH was reviewed, including their home life, tobacco status, and drug and alcohol status.   Vaccines:Uptodate, COVID x 4.   S/P PNA,Shingrix. Colon:2016  3  adenomas, Dr. Leone Payor, no followed up indicated Former smoker 20 pipe, quit 40 years ago.  Prostate eval not indicated.   Problem List Items Addressed This Visit     Cardiomyopathy (HCC)    Chronic, followed by cardiology      CHF (NYHA class II, ACC/AHA stage C) (HCC)    Euvolemic on lasix.  On ACEI.      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  Lasix 40 mg p.o. 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  Reevaluate in 6 months.      DM retinopathy (HCC)    Chronic, followed by ophthalmology.      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      Papillary thyroid carcinoma (HCC)     S/P removal of thyroid in 2018, since  then he has been on levothyroxine 150 mcg daily.  10/15/22 TSH nml at 3.45  Followed by ENDO.. he plans to have me  follow at this point.       Thoracic aortic aneurysm (TAA) (HCC)    CTA to evaluate thoracic aortic aneurysm 07/27/2022 showed stable changes      Other Visit Diagnoses     Routine general medical examination at a health care facility    -  Primary       No orders of the defined types were placed in this encounter.  Orders Placed This Encounter  Procedures   Urine Albumin/Creatinine with ratio (send out) [LAB689]   HM DIABETES FOOT EXAM    This external order was created through the Results Console.       Kerby Nora, MD

## 2022-10-16 NOTE — Assessment & Plan Note (Signed)
Chronic, well-controlled  Ramipril 2.5 mg daily

## 2022-10-16 NOTE — Assessment & Plan Note (Addendum)
S/P removal of thyroid in 2018, since  then he has been on levothyroxine 150 mcg daily.  10/15/22 TSH nml at 3.45  Followed by ENDO.. he plans to have me  follow at this point.

## 2022-10-16 NOTE — Assessment & Plan Note (Signed)
Chronic,  stable  Continue working on [employer] and regular exercise.  Reevaluate in 6 months.

## 2022-10-27 ENCOUNTER — Telehealth: Payer: Self-pay | Admitting: Primary Care

## 2022-10-28 ENCOUNTER — Telehealth: Payer: Self-pay | Admitting: Cardiovascular Disease

## 2022-10-28 NOTE — Telephone Encounter (Signed)
[employer] will try to expedite the authorization was previous told no pa req by mistake. Faxed auth request to [employer]   Will watch this request closely if rescheduling is needed for his appt.

## 2022-10-28 NOTE — Telephone Encounter (Signed)
Woke during night for bathroom and really off balance.  Woke up dizzy w light nausea this morning, HR 51 If No shortness of breath and no chest discomfort.  No weakness, speech clear. Drinks water and does not feel he is dehydrated.  No s/s of GI bleeding, last CBC wnl. Worked outside for a number of hours yesterday.  "Probably overdid it, I've been known to do that".   This am 146/88, 52 9:30 am, since then: 136/82, 51, 134/80 50 Pt has gotten up/dressed/moved around, eating breakfast, feeling some better  Usually takes Lasix every morning but has not had today.  Adv to hydrate and avoid strenuous activity for today.  Adv I will send to Dr. Clifton James for review and will call him w recommendations.    From last visit w Jari Favre, PA-C on 05/02/22:  6. Carotid bruit, right -no lightheaded/dizziness -no syncope or presyncope -he had an US of the neck more recently than 2016 but I am unable to view -might be due for another [place] next time he is in the office

## 2022-10-28 NOTE — Telephone Encounter (Signed)
Pt c/o BP issue: STAT if pt c/o blurred vision, one-sided weakness or slurred speech  1. What are your last 5 BP readings? 146/88  2. Are you having any other symptoms (ex. Dizziness, headache, blurred vision, passed out)? Dizziness  3. What is your BP issue? Pt's spouse Jola Babinski is requesting a callback due to her being concerned. Please advise

## 2022-10-30 NOTE — Telephone Encounter (Signed)
Kathleene Hazel, MD  Lendon Ka, RN I agree with you. Nothing to add here. Chris __________________________________________________________  Jeanene Erb and left detailed message checking on patient and advising no new recommendations from Dr. Clifton James.  Asked that patient call back if w how he is feeling and if he has any further symptoms.

## 2022-10-31 ENCOUNTER — Telehealth: Payer: Self-pay | Admitting: Pulmonary Disease

## 2022-10-31 NOTE — Telephone Encounter (Signed)
Pt wife is wanting an update of the process with her husband sleep study

## 2022-10-31 NOTE — Telephone Encounter (Signed)
Pt wife calling

## 2022-10-31 NOTE — Telephone Encounter (Signed)
Patient asked to cancel sleep study until he can know what he needs to pay oop for the study after his insurance covers.   Sleep study cancelled will follow up on authorization and billing for this pt.

## 2022-11-04 ENCOUNTER — Encounter (HOSPITAL_BASED_OUTPATIENT_CLINIC_OR_DEPARTMENT_OTHER): Payer: PPO | Admitting: Pulmonary Disease

## 2022-11-10 ENCOUNTER — Encounter (INDEPENDENT_AMBULATORY_CARE_PROVIDER_SITE_OTHER): Payer: PPO | Admitting: Ophthalmology

## 2022-11-10 DIAGNOSIS — H353112 Nonexudative age-related macular degeneration, right eye, intermediate dry stage: Secondary | ICD-10-CM

## 2022-11-10 DIAGNOSIS — H35033 Hypertensive retinopathy, bilateral: Secondary | ICD-10-CM

## 2022-11-10 DIAGNOSIS — H34812 Central retinal vein occlusion, left eye, with macular edema: Secondary | ICD-10-CM | POA: Diagnosis not present

## 2022-11-10 DIAGNOSIS — I10 Essential (primary) hypertension: Secondary | ICD-10-CM | POA: Diagnosis not present

## 2022-11-10 DIAGNOSIS — H43813 Vitreous degeneration, bilateral: Secondary | ICD-10-CM

## 2022-11-10 DIAGNOSIS — H33303 Unspecified retinal break, bilateral: Secondary | ICD-10-CM

## 2022-11-10 DIAGNOSIS — H348112 Central retinal vein occlusion, right eye, stable: Secondary | ICD-10-CM | POA: Diagnosis not present

## 2022-12-15 ENCOUNTER — Encounter (INDEPENDENT_AMBULATORY_CARE_PROVIDER_SITE_OTHER): Payer: PPO | Admitting: Ophthalmology

## 2022-12-15 DIAGNOSIS — H35033 Hypertensive retinopathy, bilateral: Secondary | ICD-10-CM

## 2022-12-15 DIAGNOSIS — H33303 Unspecified retinal break, bilateral: Secondary | ICD-10-CM | POA: Diagnosis not present

## 2022-12-15 DIAGNOSIS — H34812 Central retinal vein occlusion, left eye, with macular edema: Secondary | ICD-10-CM | POA: Diagnosis not present

## 2022-12-15 DIAGNOSIS — I10 Essential (primary) hypertension: Secondary | ICD-10-CM

## 2022-12-15 DIAGNOSIS — H353131 Nonexudative age-related macular degeneration, bilateral, early dry stage: Secondary | ICD-10-CM | POA: Diagnosis not present

## 2022-12-15 DIAGNOSIS — H43813 Vitreous degeneration, bilateral: Secondary | ICD-10-CM | POA: Diagnosis not present

## 2022-12-15 DIAGNOSIS — H348112 Central retinal vein occlusion, right eye, stable: Secondary | ICD-10-CM | POA: Diagnosis not present

## 2022-12-29 ENCOUNTER — Telehealth: Payer: Self-pay | Admitting: Family Medicine

## 2022-12-29 ENCOUNTER — Other Ambulatory Visit: Payer: Self-pay

## 2022-12-29 MED ORDER — OMEPRAZOLE 20 MG PO CPDR: 20.0000 mg | DELAYED_RELEASE_CAPSULE | Freq: Every day | ORAL | 3 refills | Status: DC

## 2022-12-29 NOTE — Telephone Encounter (Signed)
Furosemide and terazosin are listed as historical provider.

## 2022-12-29 NOTE — Telephone Encounter (Signed)
Prescription Request  12/29/2022  LOV: 10/16/2022  What is the name of the medication or equipment? terazosin (HYTRIN) 10 MG capsule  furosemide (LASIX) 40 MG tablet  omeprazole (PRILOSEC) 20 MG capsule   Have you contacted your pharmacy to request a refill? Yes   Which pharmacy would you like this sent to?   [facility], [place] - [address] [place] [address] [place] [address] Phone: [phone] Fax: [phone]    Patient notified that their request is being sent to the clinical staff for review and that they should receive a response within 2 business days.   Please advise at Mobile [phone] (mobile)

## 2022-12-30 ENCOUNTER — Other Ambulatory Visit (INDEPENDENT_AMBULATORY_CARE_PROVIDER_SITE_OTHER): Payer: PPO

## 2022-12-30 ENCOUNTER — Ambulatory Visit (INDEPENDENT_AMBULATORY_CARE_PROVIDER_SITE_OTHER): Payer: PPO

## 2022-12-30 ENCOUNTER — Encounter: Payer: Self-pay | Admitting: Gastroenterology

## 2022-12-30 ENCOUNTER — Ambulatory Visit: Payer: PPO | Admitting: Gastroenterology

## 2022-12-30 VITALS — BP 110/60 | HR 75 | Ht 72.0 in | Wt 216.8 lb

## 2022-12-30 DIAGNOSIS — Z8719 Personal history of other diseases of the digestive system: Secondary | ICD-10-CM

## 2022-12-30 LAB — CBC WITH DIFFERENTIAL/PLATELET
Basophils Absolute: 0 10*3/uL (ref 0.0–0.1)
Basophils Relative: 0.2 % (ref 0.0–3.0)
Eosinophils Absolute: 0.4 10*3/uL (ref 0.0–0.7)
Eosinophils Relative: 4.6 % (ref 0.0–5.0)
HCT: 34.9 % — ABNORMAL LOW (ref 39.0–52.0)
Hemoglobin: 11.5 g/dL — ABNORMAL LOW (ref 13.0–17.0)
Lymphocytes Relative: 19.1 % (ref 12.0–46.0)
Lymphs Abs: 1.6 10*3/uL (ref 0.7–4.0)
MCHC: 32.9 g/dL (ref 30.0–36.0)
MCV: 93.7 fL (ref 78.0–100.0)
Monocytes Absolute: 0.9 10*3/uL (ref 0.1–1.0)
Monocytes Relative: 11.1 % (ref 3.0–12.0)
Neutro Abs: 5.4 10*3/uL (ref 1.4–7.7)
Neutrophils Relative %: 65 % (ref 43.0–77.0)
Platelets: 162 10*3/uL (ref 150.0–400.0)
RBC: 3.72 Mil/uL — ABNORMAL LOW (ref 4.22–5.81)
RDW: 13.1 % (ref 11.5–15.5)
WBC: 8.4 10*3/uL (ref 4.0–10.5)

## 2022-12-30 LAB — IBC + FERRITIN
Ferritin: 32.8 ng/mL (ref 22.0–322.0)
Iron: 78 ug/dL (ref 42–165)
Saturation Ratios: 22.5 % (ref 20.0–50.0)
TIBC: 347.2 ug/dL (ref 250.0–450.0)
Transferrin: 248 mg/dL (ref 212.0–360.0)

## 2022-12-30 LAB — COMPREHENSIVE METABOLIC PANEL
ALT: 13 U/L (ref 0–53)
AST: 18 U/L (ref 0–37)
Albumin: 4.2 g/dL (ref 3.5–5.2)
Alkaline Phosphatase: 65 U/L (ref 39–117)
BUN: 25 mg/dL — ABNORMAL HIGH (ref 6–23)
CO2: 24 meq/L (ref 19–32)
Calcium: 9.1 mg/dL (ref 8.4–10.5)
Chloride: 104 meq/L (ref 96–112)
Creatinine, Ser: 1.07 mg/dL (ref 0.40–1.50)
GFR: 64.87 mL/min (ref >60.00)
Glucose, Bld: 117 mg/dL — ABNORMAL HIGH (ref 70–99)
Potassium: 4 meq/L (ref 3.5–5.1)
Sodium: 138 meq/L (ref 135–145)
Total Bilirubin: 1 mg/dL (ref 0.2–1.2)
Total Protein: 6.7 g/dL (ref 6.0–8.3)

## 2022-12-30 MED ORDER — FUROSEMIDE 40 MG PO TABS: 40.0000 mg | ORAL_TABLET | Freq: Every day | ORAL | 1 refills | Status: DC | PRN

## 2022-12-30 MED ORDER — TERAZOSIN HCL 10 MG PO CAPS: 10.0000 mg | ORAL_CAPSULE | Freq: Every day | ORAL | 3 refills | Status: DC

## 2022-12-30 NOTE — Telephone Encounter (Signed)
Refill sent.

## 2022-12-30 NOTE — Progress Notes (Signed)
12/30/2022 Dominic Kane [id number] 07/27/1940   HISTORY OF PRESENT ILLNESS:  This is an 82 year old male who is a patient of Dr. Marvell Fuller.  He had a severe diverticular bleed in December 2023 and had extensive procedures at that time.  Recovered well from that and hemoglobin/iron studies had normalized earlier this year.  Last week though he started with rectal bleeding again.  Called for this appt.  Showed pictures to me and it looks like a moderate amount.  Lasted for about 5 days, but tapered off over that time and now for the last 2 or 3 days he has had only just dark-colored stool, no red blood.  No abdominal pain or other associated symptoms.  Feels a little tired/rundown, but not dizzy, etc.  Is on ferrous sulfate daily.  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 15 yrs ago x1   Hypertrophy of prostate without urinary obstruction and other lower urinary tract symptoms (LUTS)    Lipoprotein deficiencies    Other and unspecified hyperlipidemia    Other primary cardiomyopathies    Pain in joint, lower leg    Personal history of colonic polyps    Personal history of  other diseases of digestive system    Pes anserinus tendinitis or bursitis    left shoulder remains an issue   Sinoatrial node dysfunction (HCC)    Dr. 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    Thoracic or lumbosacral neuritis or radiculitis, unspecified    Unspecified disorder of skin and subcutaneous tissue    Unspecified essential hypertension    Unspecified vitamin D deficiency    Past Surgical History:  Procedure Laterality Date   BASAL CELL CARCINOMA EXCISION  11/2016   Dr. Margo Aye   CATARACT EXTRACTION, BILATERAL     COLONOSCOPY W/ POLYPECTOMY     COLONOSCOPY WITH PROPOFOL N/A 03/03/2022   Procedure: COLONOSCOPY WITH PROPOFOL;  Surgeon: Benancio Deeds, MD;  Location: [facility] ENDOSCOPY;  Service: Gastroenterology;  Laterality: N/A;   COLONOSCOPY WITH PROPOFOL N/A 03/04/2022   Procedure: COLONOSCOPY WITH PROPOFOL;  Surgeon: Benancio Deeds, MD;  Location[facility] ENDOSCOPY;  Service: Gastroenterology;  Laterality: N/A;   COLONOSCOPY WITH PROPOFOL N/A 03/08/2022   Procedure: COLONOSCOPY WITH PROPOFOL;  Surgeon: Benancio Deeds, MD;  Location: [facility] ENDOSCOPY;  Service: Gastroenterology;  Laterality: N/A;   DOBUTAMINE STRESS ECHO  1/07   Lateral hypokinesis but no ischemia   ESOPHAGOGASTRODUODENOSCOPY  11/07   Schatzki's ring, non bleeding erosive gastropathy Charlotte Montrose   ESOPHAGOGASTRODUODENOSCOPY (EGD) WITH  PROPOFOL N/A 03/02/2022   Procedure: ESOPHAGOGASTRODUODENOSCOPY (EGD) WITH PROPOFOL;  Surgeon: Napoleon Form, MD;  Location: [facility] ENDOSCOPY;  Service: Gastroenterology;  Laterality: N/A;   FLEXIBLE SIGMOIDOSCOPY N/A 03/07/2022   Procedure: FLEXIBLE SIGMOIDOSCOPY;  Surgeon: Benancio Deeds, MD;  Location: [facility] ENDOSCOPY;  Service: Gastroenterology;  Laterality: N/A;   GIVENS CAPSULE STUDY N/A 03/05/2022   Procedure: GIVENS CAPSULE STUDY;  Surgeon: Benancio Deeds, MD;  Location: [facility] ENDOSCOPY;  Service: Gastroenterology;  Laterality: N/A;    KNEE SURGERY Left 1947   LYMPH NODE DISSECTION N/A 08/05/2016   Procedure: LIMITED LYMPH NODE DISSECTION;  Surgeon: Darnell Level, MD;  Location: [facility] ORS;  Service: General;  Laterality: N/A;   Pulmonary functioning tests  1. 2003  2. 2005   1. Diminished lung capacity  2. Stable   THYROIDECTOMY N/A 08/05/2016   Procedure: TOTAL THYROIDECTOMY;  Surgeon: Darnell Level, MD;  Location: [facility] ORS;  Service: General;  Laterality: N/A;   TONSILLECTOMY     TOTAL KNEE ARTHROPLASTY Left 03/18/2016   Procedure: LEFT TOTAL KNEE ARTHROPLASTY;  Surgeon: Durene Romans, MD;  Location: [facility] ORS;  Service: Orthopedics;  Laterality: Left;    reports that he quit smoking about 42 years ago. His smoking use included cigarettes. He has never used smokeless tobacco. He reports current alcohol use. He reports that he does not use drugs. family history includes Cancer in his father and sister; Coronary artery disease in his mother; Heart failure in his maternal grandmother and mother; Hypothyroidism in his daughter and mother; Stroke in his mother. Allergies  Allergen Reactions   Sulfonamide Derivatives Hives and Nausea And Vomiting   Codeine Nausea Only      Outpatient Encounter Medications as of 12/30/2022  Medication Sig   Accu-Chek Softclix Lancets lancets Use to check blood sugar once daily   acetaminophen (TYLENOL) 500 MG tablet Take 1,000 mg by mouth as needed for moderate pain.   aspirin EC 81 MG tablet Take 1 tablet (81 mg total) by mouth daily. Swallow whole.   cetirizine (ZYRTEC) 10 MG tablet Take 10 mg by mouth as needed for allergies.   Chlorpheniramine-DM (CORICIDIN HBP COUGH/COLD PO) Take 1 tablet by mouth daily as needed (congestion).   Cholecalciferol (VITAMIN D-3 PO) Take 1 capsule by mouth at bedtime.   ciprofloxacin (CILOXAN) 0.3 % ophthalmic solution Place 2 drops into both eyes as needed.   ferrous sulfate 325 (65 FE) MG EC tablet 1 tablet with food every other day   finasteride (PROSCAR) 5 MG  tablet Take 1 tablet (5 mg total) by mouth daily.   furosemide (LASIX) 40 MG tablet Take 40 mg by mouth daily as needed.   levothyroxine (SYNTHROID) 150 MCG tablet TAKE 1 TABLET BY MOUTH EVERY DAY BEFORE BREAKFAST   Melatonin 5 MG CAPS Take 5 mg by mouth at bedtime.   Multiple Vitamins-Minerals (CENTRUM SILVER 50+MEN PO) Take 1 tablet by mouth at bedtime.   Multiple Vitamins-Minerals (PRESERVISION AREDS 2 PO) Take 1 tablet by mouth in the morning and at bedtime.   nitroGLYCERIN (NITROSTAT) 0.4 MG SL tablet Place 1 tablet (0.4 mg total) under the tongue every 5 (five) minutes as needed for chest pain.   omeprazole (PRILOSEC) 20 MG capsule Take 1 capsule (20 mg total) by mouth daily.   polyethylene glycol (MIRALAX / GLYCOLAX) 17 g packet Take 17 g by mouth daily.   PRESCRIPTION MEDICATION every 6 (six) months. *Antibiotic Injection in Eye*   ramipril (ALTACE) 2.5 MG capsule TAKE 1 CAPSULE BY  MOUTH EVERY DAY   rosuvastatin (CRESTOR) 10 MG tablet Take 1 tablet (10 mg total) by mouth daily.   terazosin (HYTRIN) 10 MG capsule Take 10 mg by mouth at bedtime.   No facility-administered encounter medications on file as of 12/30/2022.     REVIEW OF SYSTEMS  : All other systems reviewed and negative except where noted in the History of Present Illness.   PHYSICAL EXAM: BP 110/60   Pulse 75   Ht 6' (1.829 m)   Wt 216 lb 12.8 oz (98.3 kg)   BMI 29.40 kg/m  General: Well developed white male in no acute distress Head: Normocephalic and atraumatic Eyes:  Sclerae anicteric, conjunctiva pink. Ears: Normal auditory acuity Lungs: Clear throughout to auscultation; no W/R/R. Heart: Regular rate and rhythm; no M/R/G. Abdomen: Soft, non-distended.  BS present.  Non-tender. Musculoskeletal: Symmetrical with no gross deformities  Skin: No lesions on visible extremities Extremities: No edema  Neurological: Alert oriented x 4, grossly non-focal Psychological:  Alert and cooperative. Normal mood and  affect  ASSESSMENT AND PLAN: *GI bleed: Had severe diverticular bleed in December 2023.  Recovered well from that and hemoglobin/iron studies had normalized earlier this year.  Last week though he started with rectal bleeding again.  Showed pictures to me and it looks like a moderate amount.  Lasted for about 5 days, but tapered off over that time and now for the last 2 or 3 days he has had only just dark-colored stool, no red blood.  Will check a CBC and iron studies today.  Will continue to monitor, but if bleeding picks up again, he becomes lightheaded/dizzy, etc, may need to go to the ER.   CC:  Excell Seltzer, MD

## 2022-12-30 NOTE — Patient Instructions (Signed)
Your provider has requested that you go to the basement level for lab work before leaving today. Press "B" on the elevator. The lab is located at the first door on the left as you exit the elevator.  _______________________________________________________  If your blood pressure at your visit was 140/90 or greater, please contact your primary care physician to follow up on this.  _______________________________________________________  If you are age 82 or older, your body mass index should be between 23-30. Your Body mass index is 29.4 kg/m. If this is out of the aforementioned range listed, please consider follow up with your Primary Care Provider.  If you are age 36 or younger, your body mass index should be between 19-25. Your Body mass index is 29.4 kg/m. If this is out of the aformentioned range listed, please consider follow up with your Primary Care Provider.   ________________________________________________________  The [facility] providers would like to encourage you to use [facility] to communicate with providers for non-urgent requests or questions.  Due to long hold times on the telephone, sending your provider a message by [facility] may be a faster and more efficient way to get a response.  Please allow 48 business hours for a response.  Please remember that this is for non-urgent requests.  _______________________________________________________

## 2023-01-19 ENCOUNTER — Encounter (INDEPENDENT_AMBULATORY_CARE_PROVIDER_SITE_OTHER): Payer: PPO | Admitting: Ophthalmology

## 2023-01-19 DIAGNOSIS — H34812 Central retinal vein occlusion, left eye, with macular edema: Secondary | ICD-10-CM

## 2023-01-19 DIAGNOSIS — H33303 Unspecified retinal break, bilateral: Secondary | ICD-10-CM

## 2023-01-19 DIAGNOSIS — H353132 Nonexudative age-related macular degeneration, bilateral, intermediate dry stage: Secondary | ICD-10-CM

## 2023-01-19 DIAGNOSIS — H43813 Vitreous degeneration, bilateral: Secondary | ICD-10-CM | POA: Diagnosis not present

## 2023-01-19 DIAGNOSIS — H35033 Hypertensive retinopathy, bilateral: Secondary | ICD-10-CM

## 2023-01-19 DIAGNOSIS — I10 Essential (primary) hypertension: Secondary | ICD-10-CM

## 2023-01-19 DIAGNOSIS — H348112 Central retinal vein occlusion, right eye, stable: Secondary | ICD-10-CM | POA: Diagnosis not present

## 2023-02-02 ENCOUNTER — Encounter (INDEPENDENT_AMBULATORY_CARE_PROVIDER_SITE_OTHER): Payer: PPO | Admitting: Ophthalmology

## 2023-02-11 ENCOUNTER — Other Ambulatory Visit: Payer: Self-pay

## 2023-02-11 ENCOUNTER — Telehealth: Payer: Self-pay | Admitting: Physician Assistant

## 2023-02-11 DIAGNOSIS — R9431 Abnormal electrocardiogram [ECG] [EKG]: Secondary | ICD-10-CM

## 2023-02-11 NOTE — Telephone Encounter (Signed)
Took stat call from Dr Susann Givens- who is doing a study trial for [facility]. Reports they did a EKG on pt and it showed T wave changes and possibly some ischemia. Pt is asymptomatic and Dr Susann Givens just wanted to let Julian Hy know of EKG changes.

## 2023-02-11 NOTE — Telephone Encounter (Signed)
Abn EKG

## 2023-02-16 ENCOUNTER — Other Ambulatory Visit: Payer: Self-pay | Admitting: Physician Assistant

## 2023-02-16 DIAGNOSIS — R9431 Abnormal electrocardiogram [ECG] [EKG]: Secondary | ICD-10-CM

## 2023-02-23 ENCOUNTER — Encounter (INDEPENDENT_AMBULATORY_CARE_PROVIDER_SITE_OTHER): Payer: PPO | Admitting: Ophthalmology

## 2023-02-23 ENCOUNTER — Encounter (HOSPITAL_COMMUNITY): Payer: Self-pay

## 2023-02-23 DIAGNOSIS — H43813 Vitreous degeneration, bilateral: Secondary | ICD-10-CM | POA: Diagnosis not present

## 2023-02-23 DIAGNOSIS — I10 Essential (primary) hypertension: Secondary | ICD-10-CM

## 2023-02-23 DIAGNOSIS — H353112 Nonexudative age-related macular degeneration, right eye, intermediate dry stage: Secondary | ICD-10-CM

## 2023-02-23 DIAGNOSIS — H348112 Central retinal vein occlusion, right eye, stable: Secondary | ICD-10-CM | POA: Diagnosis not present

## 2023-02-23 DIAGNOSIS — H35033 Hypertensive retinopathy, bilateral: Secondary | ICD-10-CM | POA: Diagnosis not present

## 2023-02-23 DIAGNOSIS — H33303 Unspecified retinal break, bilateral: Secondary | ICD-10-CM

## 2023-02-23 DIAGNOSIS — H34812 Central retinal vein occlusion, left eye, with macular edema: Secondary | ICD-10-CM | POA: Diagnosis not present

## 2023-02-24 ENCOUNTER — Telehealth (HOSPITAL_COMMUNITY): Payer: Self-pay | Admitting: *Deleted

## 2023-02-24 NOTE — Telephone Encounter (Signed)
Pt reached and given instructions for MPI study. 

## 2023-02-26 ENCOUNTER — Ambulatory Visit (HOSPITAL_COMMUNITY): Payer: PPO | Attending: Internal Medicine

## 2023-02-26 DIAGNOSIS — R9431 Abnormal electrocardiogram [ECG] [EKG]: Secondary | ICD-10-CM | POA: Diagnosis not present

## 2023-02-26 LAB — MYOCARDIAL PERFUSION IMAGING
LV dias vol: 161 mL (ref 62–150)
LV sys vol: 96 mL
Nuc Stress EF: 41 %
Peak HR: 82 {beats}/min
Rest HR: 63 {beats}/min
Rest Nuclear Isotope Dose: 11 mCi
SDS: 17
SRS: 7
SSS: 25
ST Depression (mm): 0 mm
Stress Nuclear Isotope Dose: 31.2 mCi
TID: 1.09

## 2023-02-26 MED ORDER — TECHNETIUM TC 99M TETROFOSMIN IV KIT
31.2000 | PACK | Freq: Once | INTRAVENOUS | Status: AC | PRN
  Administered 2023-02-26: 31.2 via INTRAVENOUS

## 2023-02-26 MED ORDER — REGADENOSON 0.4 MG/5ML IV SOLN
0.4000 mg | Freq: Once | INTRAVENOUS | Status: AC
Start: 2023-02-26 — End: 2023-02-26
  Administered 2023-02-26: 0.4 mg via INTRAVENOUS

## 2023-02-26 MED ORDER — TECHNETIUM TC 99M TETROFOSMIN IV KIT
11.0000 | PACK | Freq: Once | INTRAVENOUS | Status: AC | PRN
  Administered 2023-02-26: 11 via INTRAVENOUS

## 2023-03-26 DIAGNOSIS — H52223 Regular astigmatism, bilateral: Secondary | ICD-10-CM | POA: Diagnosis not present

## 2023-03-26 DIAGNOSIS — H353112 Nonexudative age-related macular degeneration, right eye, intermediate dry stage: Secondary | ICD-10-CM | POA: Diagnosis not present

## 2023-03-26 DIAGNOSIS — E113392 Type 2 diabetes mellitus with moderate nonproliferative diabetic retinopathy without macular edema, left eye: Secondary | ICD-10-CM | POA: Diagnosis not present

## 2023-03-26 DIAGNOSIS — H353124 Nonexudative age-related macular degeneration, left eye, advanced atrophic with subfoveal involvement: Secondary | ICD-10-CM | POA: Diagnosis not present

## 2023-03-26 DIAGNOSIS — E113291 Type 2 diabetes mellitus with mild nonproliferative diabetic retinopathy without macular edema, right eye: Secondary | ICD-10-CM | POA: Diagnosis not present

## 2023-03-26 DIAGNOSIS — H5203 Hypermetropia, bilateral: Secondary | ICD-10-CM | POA: Diagnosis not present

## 2023-03-26 DIAGNOSIS — H524 Presbyopia: Secondary | ICD-10-CM | POA: Diagnosis not present

## 2023-03-26 DIAGNOSIS — Z961 Presence of intraocular lens: Secondary | ICD-10-CM | POA: Diagnosis not present

## 2023-03-26 DIAGNOSIS — H43813 Vitreous degeneration, bilateral: Secondary | ICD-10-CM | POA: Diagnosis not present

## 2023-03-26 LAB — HM DIABETES EYE EXAM

## 2023-03-30 ENCOUNTER — Encounter (INDEPENDENT_AMBULATORY_CARE_PROVIDER_SITE_OTHER): Payer: PPO | Admitting: Ophthalmology

## 2023-03-30 ENCOUNTER — Telehealth: Payer: Self-pay | Admitting: *Deleted

## 2023-03-30 DIAGNOSIS — H35033 Hypertensive retinopathy, bilateral: Secondary | ICD-10-CM | POA: Diagnosis not present

## 2023-03-30 DIAGNOSIS — H43813 Vitreous degeneration, bilateral: Secondary | ICD-10-CM

## 2023-03-30 DIAGNOSIS — H33303 Unspecified retinal break, bilateral: Secondary | ICD-10-CM

## 2023-03-30 DIAGNOSIS — H353112 Nonexudative age-related macular degeneration, right eye, intermediate dry stage: Secondary | ICD-10-CM

## 2023-03-30 DIAGNOSIS — H348112 Central retinal vein occlusion, right eye, stable: Secondary | ICD-10-CM | POA: Diagnosis not present

## 2023-03-30 DIAGNOSIS — H34812 Central retinal vein occlusion, left eye, with macular edema: Secondary | ICD-10-CM

## 2023-03-30 DIAGNOSIS — E11311 Type 2 diabetes mellitus with unspecified diabetic retinopathy with macular edema: Secondary | ICD-10-CM

## 2023-03-30 DIAGNOSIS — I10 Essential (primary) hypertension: Secondary | ICD-10-CM | POA: Diagnosis not present

## 2023-03-30 DIAGNOSIS — E1169 Type 2 diabetes mellitus with other specified complication: Secondary | ICD-10-CM

## 2023-03-30 NOTE — Telephone Encounter (Signed)
-----   Message from Lovena Neighbours sent at 03/30/2023  3:18 PM EST ----- Regarding: Labs for Tuesday 1.21.25 Please put lab orders in future. Thank you, Denny Peon

## 2023-04-01 DIAGNOSIS — Z1283 Encounter for screening for malignant neoplasm of skin: Secondary | ICD-10-CM | POA: Diagnosis not present

## 2023-04-01 DIAGNOSIS — C44619 Basal cell carcinoma of skin of left upper limb, including shoulder: Secondary | ICD-10-CM | POA: Diagnosis not present

## 2023-04-01 DIAGNOSIS — D225 Melanocytic nevi of trunk: Secondary | ICD-10-CM | POA: Diagnosis not present

## 2023-04-01 DIAGNOSIS — D485 Neoplasm of uncertain behavior of skin: Secondary | ICD-10-CM | POA: Diagnosis not present

## 2023-04-01 DIAGNOSIS — C44319 Basal cell carcinoma of skin of other parts of face: Secondary | ICD-10-CM | POA: Diagnosis not present

## 2023-04-14 ENCOUNTER — Encounter: Payer: Self-pay | Admitting: Family Medicine

## 2023-04-14 ENCOUNTER — Other Ambulatory Visit (INDEPENDENT_AMBULATORY_CARE_PROVIDER_SITE_OTHER): Payer: PPO

## 2023-04-14 DIAGNOSIS — E785 Hyperlipidemia, unspecified: Secondary | ICD-10-CM | POA: Diagnosis not present

## 2023-04-14 DIAGNOSIS — E1169 Type 2 diabetes mellitus with other specified complication: Secondary | ICD-10-CM | POA: Diagnosis not present

## 2023-04-14 DIAGNOSIS — E11311 Type 2 diabetes mellitus with unspecified diabetic retinopathy with macular edema: Secondary | ICD-10-CM

## 2023-04-14 LAB — COMPREHENSIVE METABOLIC PANEL
ALT: 12 U/L (ref 0–53)
AST: 16 U/L (ref 0–37)
Albumin: 4.5 g/dL (ref 3.5–5.2)
Alkaline Phosphatase: 71 U/L (ref 39–117)
BUN: 25 mg/dL — ABNORMAL HIGH (ref 6–23)
CO2: 27 meq/L (ref 19–32)
Calcium: 9.3 mg/dL (ref 8.4–10.5)
Chloride: 102 meq/L (ref 96–112)
Creatinine, Ser: 1.05 mg/dL (ref 0.40–1.50)
GFR: 66.22 mL/min (ref >60.00)
Glucose, Bld: 111 mg/dL — ABNORMAL HIGH (ref 70–99)
Potassium: 4.1 meq/L (ref 3.5–5.1)
Sodium: 139 meq/L (ref 135–145)
Total Bilirubin: 1.1 mg/dL (ref 0.2–1.2)
Total Protein: 6.9 g/dL (ref 6.0–8.3)

## 2023-04-14 LAB — LIPID PANEL
Cholesterol: 128 mg/dL (ref 0–200)
HDL: 42.3 mg/dL (ref >39.00)
LDL Cholesterol: 69 mg/dL (ref 0–99)
NonHDL: 85.68
Total CHOL/HDL Ratio: 3
Triglycerides: 84 mg/dL (ref 0.0–149.0)
VLDL: 16.8 mg/dL (ref 0.0–40.0)

## 2023-04-14 LAB — HEMOGLOBIN A1C: Hgb A1c MFr Bld: 7.1 % — ABNORMAL HIGH (ref 4.6–6.5)

## 2023-04-14 NOTE — Progress Notes (Signed)
No critical labs need to be addressed urgently. We will discuss labs in detail at upcoming office visit.   

## 2023-04-21 ENCOUNTER — Encounter: Payer: Self-pay | Admitting: Family Medicine

## 2023-04-21 ENCOUNTER — Ambulatory Visit (INDEPENDENT_AMBULATORY_CARE_PROVIDER_SITE_OTHER): Payer: PPO | Admitting: Family Medicine

## 2023-04-21 VITALS — BP 100/60 | HR 71 | Temp 98.1°F | Ht 69.75 in | Wt 220.0 lb

## 2023-04-21 DIAGNOSIS — I152 Hypertension secondary to endocrine disorders: Secondary | ICD-10-CM | POA: Diagnosis not present

## 2023-04-21 DIAGNOSIS — E1169 Type 2 diabetes mellitus with other specified complication: Secondary | ICD-10-CM | POA: Diagnosis not present

## 2023-04-21 DIAGNOSIS — E11311 Type 2 diabetes mellitus with unspecified diabetic retinopathy with macular edema: Secondary | ICD-10-CM

## 2023-04-21 DIAGNOSIS — I509 Heart failure, unspecified: Secondary | ICD-10-CM

## 2023-04-21 DIAGNOSIS — E1159 Type 2 diabetes mellitus with other circulatory complications: Secondary | ICD-10-CM

## 2023-04-21 DIAGNOSIS — E785 Hyperlipidemia, unspecified: Secondary | ICD-10-CM

## 2023-04-21 DIAGNOSIS — E11319 Type 2 diabetes mellitus with unspecified diabetic retinopathy without macular edema: Secondary | ICD-10-CM

## 2023-04-21 DIAGNOSIS — E1340 Other specified diabetes mellitus with diabetic neuropathy, unspecified: Secondary | ICD-10-CM

## 2023-04-21 NOTE — Assessment & Plan Note (Signed)
Euvolemic on lasix.  On ACEI.

## 2023-04-21 NOTE — Progress Notes (Signed)
Patient ID: Dominic Kane, male    DOB: [birth year], 83 y.o.   MRN: [id number]  This visit was conducted in person.  BP 100/60 (BP Location: Left Arm, Patient Position: Sitting, Cuff Size: Large)   Pulse 71   Temp 98.1 F (36.7 C) (Temporal)   Ht 5' 9.75" (1.772 m)   Wt 220 lb (99.8 kg)   SpO2 94%   BMI 31.79 kg/m    CC:  Chief Complaint  Patient presents with   Diabetes      Subjective:   HPI: Dominic Kane is a 83 y.o. male presenting on 04/21/2023 for   DM follow up   Diabetes:  Diet controlled in past.. too much carbohydrate and minimal activity. Lab Results  Component Value Date   HGBA1C 7.1 (H) 04/14/2023  Using medications without difficulties: Hypoglycemic episodes:none Hyperglycemic episodes: none Feet problems: no ulcers Blood Sugars averaging:  FBS  120 eye exam within last year: yes  Associated with neuropathy and retinopathy  Hypertension:    Stable control on low dose rampiril BP Readings from Last 3 Encounters:  04/21/23 100/60  12/30/22 110/60  10/16/22 128/64  Using medication without problems or lightheadedness:  occ Chest pain with exertion: none Edema: none Short of breath:  Home BPs: at goal  Elevated Cholesterol:  LD at goal  < 70 on Crestor 10 mg daily Lab Results  Component Value Date   CHOL 128 04/14/2023   HDL 42.30 04/14/2023   LDLCALC 69 04/14/2023   TRIG 84.0 04/14/2023   CHOLHDL 3 04/14/2023  Using medications without problems: Muscle aches:  Diet compliance: moderate Exercise:  walking daily Other complaints:  Cardiomyopathy , TAA, CAD and CHF followed by cardiology. Reviewed last office visit note from Jari Favre, [place] May 02, 2022 Most recent echo showed May 13, 2022 EF 35 to 40%, stable, no valvular disease. CTA to evaluate thoracic aortic aneurysm 07/27/2022 showed stable changes  Hypothyroid and papillary thyroid care followed by Dr.  Talmage Nap... he wishes to have me follow it.    Relevant past  medical, surgical, family and social history reviewed and updated as indicated. Interim medical history since our last visit reviewed. Allergies and medications reviewed and updated. Outpatient Medications Prior to Visit  Medication Sig Dispense Refill   Accu-Chek Softclix Lancets lancets Use to check blood sugar once daily 100 each 3   acetaminophen (TYLENOL) 500 MG tablet Take 1,000 mg by mouth as needed for moderate pain.     aspirin EC 81 MG tablet Take 1 tablet (81 mg total) by mouth daily. Swallow whole. 30 tablet 12   cetirizine (ZYRTEC) 10 MG tablet Take 10 mg by mouth as needed for allergies.     Chlorpheniramine-DM (CORICIDIN HBP COUGH/COLD PO) Take 1 tablet by mouth daily as needed (congestion).     Cholecalciferol (VITAMIN D-3 PO) Take 1 capsule by mouth at bedtime.     ciprofloxacin (CILOXAN) 0.3 % ophthalmic solution Place 2 drops into both eyes as needed.     ferrous sulfate 325 (65 FE) MG EC tablet 1 tablet with food every other day 100 tablet 3   finasteride (PROSCAR) 5 MG tablet Take 1 tablet (5 mg total) by mouth daily. 90 tablet 3   furosemide (LASIX) 40 MG tablet Take 1 tablet (40 mg total) by mouth daily as needed. 90 tablet 1   Lancets (ONETOUCH DELICA PLUS LANCET33G) MISC      levothyroxine (SYNTHROID) 150 MCG tablet TAKE 1 TABLET BY MOUTH  EVERY DAY BEFORE BREAKFAST 90 tablet 3   Melatonin 5 MG CAPS Take 5 mg by mouth at bedtime.     Multiple Vitamins-Minerals (PRESERVISION AREDS 2 PO) Take 1 tablet by mouth in the morning and at bedtime.     nitroGLYCERIN (NITROSTAT) 0.4 MG SL tablet Place 1 tablet (0.4 mg total) under the tongue every 5 (five) minutes as needed for chest pain. 25 tablet 3   omeprazole (PRILOSEC) 20 MG capsule Take 1 capsule (20 mg total) by mouth daily. 90 capsule 3   polyethylene glycol (MIRALAX / GLYCOLAX) 17 g packet Take 17 g by mouth daily.     PRESCRIPTION MEDICATION every 6 (six) months. *Antibiotic Injection in Eye*     ramipril (ALTACE) 2.5 MG  capsule TAKE 1 CAPSULE BY MOUTH EVERY DAY 90 capsule 3   rosuvastatin (CRESTOR) 10 MG tablet Take 1 tablet (10 mg total) by mouth daily. 90 tablet 3   terazosin (HYTRIN) 10 MG capsule Take 1 capsule (10 mg total) by mouth at bedtime. 90 capsule 3   Multiple Vitamins-Minerals (CENTRUM SILVER 50+MEN PO) Take 1 tablet by mouth at bedtime.     No facility-administered medications prior to visit.     Per HPI unless specifically indicated in ROS section below Review of Systems  Constitutional:  Positive for fever. Negative for fatigue.  HENT:  Positive for sinus pressure and sore throat. Negative for ear pain.   Eyes:  Negative for pain.  Respiratory:  Positive for cough. Negative for shortness of breath.   Cardiovascular:  Negative for chest pain, palpitations and leg swelling.  Gastrointestinal:  Negative for abdominal pain.  Genitourinary:  Negative for dysuria.  Musculoskeletal:  Negative for arthralgias.  Neurological:  Negative for syncope, light-headedness and headaches.  Psychiatric/Behavioral:  Negative for dysphoric mood.    Objective:  BP 100/60 (BP Location: Left Arm, Patient Position: Sitting, Cuff Size: Large)   Pulse 71   Temp 98.1 F (36.7 C) (Temporal)   Ht 5' 9.75" (1.772 m)   Wt 220 lb (99.8 kg)   SpO2 94%   BMI 31.79 kg/m   Wt Readings from Last 3 Encounters:  04/21/23 220 lb (99.8 kg)  02/26/23 216 lb (98 kg)  12/30/22 216 lb 12.8 oz (98.3 kg)      Physical Exam Constitutional:      Appearance: He is well-developed. He is not ill-appearing or toxic-appearing.  HENT:     Head: Normocephalic.     Right Ear: Hearing normal.     Left Ear: Hearing normal.     Nose: Congestion present.     Mouth/Throat:     Pharynx: No oropharyngeal exudate or posterior oropharyngeal erythema.  Eyes:     Conjunctiva/sclera: Conjunctivae normal.  Neck:     Thyroid: No thyroid mass or thyromegaly.     Vascular: No carotid bruit.     Trachea: Trachea normal.  Cardiovascular:      Rate and Rhythm: Normal rate and regular rhythm.     Pulses: Normal pulses.     Heart sounds: Heart sounds not distant. No murmur heard.    No friction rub. No gallop.     Comments: No peripheral edema Pulmonary:     Effort: Pulmonary effort is normal. No respiratory distress.     Breath sounds: Normal breath sounds.  Skin:    General: Skin is warm and dry.     Findings: No rash.  Psychiatric:        Speech: Speech normal.  Behavior: Behavior normal.        Thought Content: Thought content normal.        Results for orders placed or performed in visit on 04/14/23  Lipid panel   Collection Time: 04/14/23  8:03 AM  Result Value Ref Range   Cholesterol 128 0 - 200 mg/dL   Triglycerides 16.1 0.0 - 149.0 mg/dL   HDL 09.60 >45.40 mg/dL   VLDL 98.1 0.0 - 19.1 mg/dL   LDL Cholesterol 69 0 - 99 mg/dL   Total CHOL/HDL Ratio 3    NonHDL 85.68   Hemoglobin A1c   Collection Time: 04/14/23  8:03 AM  Result Value Ref Range   Hgb A1c MFr Bld 7.1 (H) 4.6 - 6.5 %  Comprehensive metabolic panel   Collection Time: 04/14/23  8:03 AM  Result Value Ref Range   Sodium 139 135 - 145 mEq/L   Potassium 4.1 3.5 - 5.1 mEq/L   Chloride 102 96 - 112 mEq/L   CO2 27 19 - 32 mEq/L   Glucose, Bld 111 (H) 70 - 99 mg/dL   BUN 25 (H) 6 - 23 mg/dL   Creatinine, Ser 4.78 0.40 - 1.50 mg/dL   Total Bilirubin 1.1 0.2 - 1.2 mg/dL   Alkaline Phosphatase 71 39 - 117 U/L   AST 16 0 - 37 U/L   ALT 12 0 - 53 U/L   Total Protein 6.9 6.0 - 8.3 g/dL   Albumin 4.5 3.5 - 5.2 g/dL   GFR 29.56 >21.30 mL/min   Calcium 9.3 8.4 - 10.5 mg/dL    This visit occurred during the SARS-CoV-2 public health emergency.  Safety protocols were in place, including screening questions prior to the visit, additional usage of staff PPE, and extensive cleaning of exam room while observing appropriate contact time as indicated for disinfecting solutions.   COVID 19 screen:  No recent travel or known exposure to COVID19 The  patient denies respiratory symptoms of COVID 19 at this time. The importance of social distancing was discussed today.   Assessment and Plan    Problem List Items Addressed This Visit     CHF (NYHA class II, ACC/AHA stage C) (HCC)   Euvolemic on lasix.  On ACEI.      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 discussed treatment options... GLP1 contraindicated given papillary thyroid cancer history.  Continue working on [employer] and regular exercise.  Reevaluate in 6 months.      DM retinopathy (HCC)   Chronic, followed by ophthalmology.      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        No orders of the defined types were placed in this encounter.  No orders of the defined types were placed in this encounter.      Kerby Nora, MD

## 2023-04-21 NOTE — Assessment & Plan Note (Signed)
Chronic,   worsening.. discussed treatment options... GLP1 contraindicated given papillary thyroid cancer history.  Continue working on [employer] and regular exercise.  Reevaluate in 6 months.

## 2023-04-21 NOTE — Assessment & Plan Note (Signed)
Chronic, followed by ophthalmology.

## 2023-04-21 NOTE — Patient Instructions (Signed)
Work on low [employer], and regular exercsie.

## 2023-04-21 NOTE — Assessment & Plan Note (Signed)
Chronic, well-controlled  Ramipril 2.5 mg daily

## 2023-04-21 NOTE — Assessment & Plan Note (Signed)
Chronic, Due to DM, minimally symptomatic.

## 2023-04-21 NOTE — Assessment & Plan Note (Signed)
Chronic, almost at goal LDL less than 70 on Crestor 10 mg daily

## 2023-04-26 NOTE — Progress Notes (Unsigned)
Office Visit    Patient Name: Dominic Kane Date of Encounter: 04/27/2023  PCP:  Excell Seltzer, MD   [facility]  Cardiologist:  Verne Carrow, MD  Advanced Practice Provider:  No care team member to display Electrophysiologist:  None   HPI    Dominic Kane is a 83 y.o. male with a past medical history significant for diastolic and systolic CHF, hypertension, nonobstructive coronary artery disease, thoracic aortic aneurysm, nonischemic cardiomyopathy, benign prostatic hypertrophy and hyperlipidemia presents today for follow-up appointment.  Prior to 2005, he was known to have an LVEF around 35%.  Had an episode of chest pain in 2005 leading to cardiac catheterization May 2005 which showed mild nonobstructive coronary artery disease.  EF was noted to be 65%.  An echocardiogram during that admission in May 2005 in [place] showed an ejection fraction of 55 to 60% with the ascending aorta at the sinotubular junction measuring 4.1 cm.  LVEF had been noted to be around 45% on studies in 2010, 2011.  Echo September 2013 showed LVEF around 30%.  Cardiac MRI December 2014 showed mild enlargement of aortic root (4.2 cm) and EF of 44%.  Last stress test 9/13 without ischemia.  Echo May 2021 with LVEF 30 to 35% with global hypokinesis.  Mild AI.  Mild dilatation of the ascending aorta.  Nuclear stress test May 2021 with no ischemia.  Chest CTA May 2022 with stable ascending aorta 4.3 cm.  Last seen 04/04/2021 for follow-up.  Asymptomatic at that time.  I saw him February 2024, he states that he had some of his first bouts of shortness of breath and chest pain back in October or November.  He backed off what he was doing and it went away.  He does lots of lifting overhead and was working at the time.  He still having some fatigue but had a GI bleed back in December so attributes it to that.  He still can do what ever he wants to do but needs to take a rest after 2 to  3 hours of work.  Particularly yard work.  We discussed repeating an echocardiogram since he has not had one in a few years and we will need to update his CTA in May for his annual surveillance of his ascending aortic aneurysm.  Today, he presents with a history of heart disease and type 2 diabetes, presents with increased shortness of breath and chest pressure. The symptoms, which started several months ago, occur unexpectedly and are particularly noticeable with prolonged standing and activity. The patient describes the chest pressure as a sensation in the upper chest, not making it hard to breathe but noticeable. The shortness of breath tends to build with activity but then levels off, allowing the patient to continue the activity. The patient also reports a decrease in energy levels and occasional lightheadedness. The patient's diabetes is currently managed with diet and exercise, but his recent A1c was 7, slightly higher than the previous year's 6. The patient also has an enlarged aorta, which is being monitored with annual CT scans. The patient's blood pressure has been low recently, and he has been experiencing some lightheadedness and decreased energy levels.  No edema, orthopnea, PND. Reports no palpitations.   Discussed the use of AI scribe software for clinical note transcription with the patient, who gave verbal consent to proceed.   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 15 yrs ago x1   Hypertrophy of prostate without urinary obstruction and other lower urinary tract symptoms (LUTS)    Lipoprotein deficiencies    Other and unspecified hyperlipidemia    Other primary cardiomyopathies    Pain in joint, lower leg    Personal history of colonic polyps    Personal history of other diseases of digestive system    Pes anserinus tendinitis or bursitis    left shoulder remains an issue   Sinoatrial node dysfunction (HCC)    Dr. 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    Thoracic or lumbosacral neuritis or radiculitis, unspecified    Unspecified disorder of skin and subcutaneous tissue    Unspecified essential hypertension    Unspecified vitamin D deficiency    Past Surgical History:  Procedure Laterality Date   BASAL CELL CARCINOMA EXCISION  11/2016   Dr. Margo Aye   CATARACT EXTRACTION, BILATERAL     COLONOSCOPY W/ POLYPECTOMY     COLONOSCOPY WITH PROPOFOL N/A 03/03/2022   Procedure: COLONOSCOPY WITH PROPOFOL;  Surgeon: Benancio Deeds, MD;  Location: [facility] ENDOSCOPY;  Service: Gastroenterology;  Laterality: N/A;   COLONOSCOPY WITH PROPOFOL N/A 03/04/2022   Procedure: COLONOSCOPY WITH PROPOFOL;  Surgeon: Benancio Deeds, MD;  Location: [facility] ENDOSCOPY;  Service: Gastroenterology;  Laterality: N/A;   COLONOSCOPY WITH PROPOFOL N/A 03/08/2022   Procedure: COLONOSCOPY WITH PROPOFOL;  Surgeon: Benancio Deeds, MD;  Location: [facility] ENDOSCOPY;  Service: Gastroenterology;  Laterality: N/A;   DOBUTAMINE STRESS ECHO  1/07   Lateral hypokinesis but no ischemia   ESOPHAGOGASTRODUODENOSCOPY  11/07   Schatzki's ring,  non bleeding erosive gastropathy Charlotte Taylor   ESOPHAGOGASTRODUODENOSCOPY (EGD) WITH PROPOFOL N/A 03/02/2022   Procedure: ESOPHAGOGASTRODUODENOSCOPY (EGD) WITH PROPOFOL;  Surgeon: Napoleon Form, MD;  Location: [facility] ENDOSCOPY;  Service: Gastroenterology;  Laterality: N/A;   FLEXIBLE SIGMOIDOSCOPY N/A 03/07/2022   Procedure: FLEXIBLE SIGMOIDOSCOPY;  Surgeon: Benancio Deeds, MD;  Location: [facility] ENDOSCOPY;  Service: Gastroenterology;  Laterality: N/A;   GIVENS CAPSULE STUDY N/A 03/05/2022   Procedure: GIVENS CAPSULE STUDY;  Surgeon: Benancio Deeds, MD;  Location: [facility] ENDOSCOPY;  Service: Gastroenterology;  Laterality: N/A;   KNEE SURGERY Left 1947   LYMPH NODE DISSECTION N/A 08/05/2016   Procedure: LIMITED LYMPH NODE DISSECTION;  Surgeon: Darnell Level, MD;  Location: [facility] ORS;  Service: General;  Laterality: N/A;   Pulmonary functioning tests  1. 2003  2. 2005   1. Diminished lung capacity  2. Stable   THYROIDECTOMY N/A 08/05/2016   Procedure: TOTAL THYROIDECTOMY;  Surgeon: Darnell Level,  MD;  Location: [facility] ORS;  Service: General;  Laterality: N/A;   TONSILLECTOMY     TOTAL KNEE ARTHROPLASTY Left 03/18/2016   Procedure: LEFT TOTAL KNEE ARTHROPLASTY;  Surgeon: Durene Romans, MD;  Location: [facility] ORS;  Service: Orthopedics;  Laterality: Left;    Allergies  Allergies  Allergen Reactions   Sulfonamide Derivatives Hives and Nausea And Vomiting   Codeine Nausea Only     EKGs/Labs/Other Studies Reviewed:   The following studies were reviewed today:  CT Angio 08/02/21  IMPRESSION: Grossly stable 4.1 cm ascending thoracic aortic aneurysm. Recommend annual imaging followup by CTA or MRA. This recommendation follows 2010 ACCF/AHA/AATS/ACR/ASA/SCA/SCAI/SIR/STS/SVM Guidelines for the Diagnosis and Management of Patients with Thoracic Aortic Disease. Circulation. 2010; 121: I347-Q259. Aortic aneurysm NOS (ICD10-I71.9).   Echo 08/10/19:   1. Left ventricular ejection fraction, by  estimation, is 30 to 35%. The  left ventricle has moderately decreased function. The left ventricle has  no regional wall motion abnormalities. There is mild concentric left  ventricular hypertrophy. Left  ventricular diastolic parameters are consistent with Grade I diastolic  dysfunction (impaired relaxation). Elevated left ventricular end-diastolic  pressure.   2. Right ventricular systolic function is normal. The right ventricular  size is normal. There is normal pulmonary artery systolic pressure.   3. Left atrial size was moderately dilated.   4. The mitral valve is normal in structure. Trivial mitral valve  regurgitation. No evidence of mitral stenosis.   5. The aortic valve is tricuspid. Aortic valve regurgitation is mild. No  aortic stenosis is present.   6. Aortic dilatation noted. 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   7. The inferior vena cava is normal in size with greater than 50%  respiratory variability, suggesting right atrial pressure of 3 mmHg.    EKG:  EKG is  ordered today. The ekg ordered today demonstrates: sinus, RBBB  EKG:  EKG is not ordered today.    Recent Labs: 08/14/2022: TSH 3.09 12/30/2022: Hemoglobin 11.5; Platelets 162.0 04/14/2023: ALT 12; BUN 25; Creatinine, Ser 1.05; Potassium 4.1; Sodium 139  Recent Lipid Panel    Component Value Date/Time   CHOL 128 04/14/2023 0803   TRIG 84.0 04/14/2023 0803   HDL 42.30 04/14/2023 0803   CHOLHDL 3 04/14/2023 0803   VLDL 16.8 04/14/2023 0803   LDLCALC 69 04/14/2023 0803     Home Medications   Current Meds  Medication Sig   Accu-Chek Softclix Lancets lancets Use to check blood sugar once daily   acetaminophen (TYLENOL) 500 MG tablet Take 1,000 mg by mouth as needed for moderate pain.   aspirin EC 81 MG tablet Take 1 tablet (81 mg total) by mouth daily. Swallow whole.   cetirizine (ZYRTEC) 10 MG tablet Take 10 mg by mouth as needed for allergies.   Chlorpheniramine-DM  (CORICIDIN HBP COUGH/COLD PO) Take 1 tablet by mouth daily as needed (congestion).   Cholecalciferol (VITAMIN D-3 PO) Take 1 capsule by mouth at bedtime.   ciprofloxacin (CILOXAN) 0.3 % ophthalmic solution Place 2 drops into both eyes as needed.   ferrous sulfate 325 (65 FE) MG EC tablet 1 tablet with food every other day   finasteride (PROSCAR) 5 MG tablet Take 1 tablet (5 mg total) by mouth daily.   furosemide (LASIX) 40 MG tablet Take 1 tablet (40 mg total) by mouth daily as needed.   Lancets (ONETOUCH DELICA PLUS LANCET33G) MISC    levothyroxine (SYNTHROID) 150 MCG tablet TAKE 1 TABLET BY MOUTH  EVERY DAY BEFORE BREAKFAST   Melatonin 5 MG CAPS Take 5 mg by mouth at bedtime.   Multiple Vitamins-Minerals (PRESERVISION AREDS 2 PO) Take 1 tablet by mouth in the morning and at bedtime.   omeprazole (PRILOSEC) 20 MG capsule Take 1 capsule (20 mg total) by mouth daily.   polyethylene glycol (MIRALAX / GLYCOLAX) 17 g packet Take 17 g by mouth daily.   PRESCRIPTION MEDICATION every 6 (six) months. *Antibiotic Injection in Eye*   ramipril (ALTACE) 2.5 MG capsule TAKE 1 CAPSULE BY MOUTH EVERY DAY   rosuvastatin (CRESTOR) 10 MG tablet Take 1 tablet (10 mg total) by mouth daily.   terazosin (HYTRIN) 10 MG capsule Take 1 capsule (10 mg total) by mouth at bedtime.   [DISCONTINUED] nitroGLYCERIN (NITROSTAT) 0.4 MG SL tablet Place 1 tablet (0.4 mg total) under the tongue every 5 (five) minutes as needed for chest pain.     Review of Systems      All other systems reviewed and are otherwise negative except as noted above.  Physical Exam    VS:  BP (!) 96/58   Pulse 67   Resp (!) 93   Ht 5\' 9"  (1.753 m)   Wt 219 lb 9.6 oz (99.6 kg)   BMI 32.43 kg/m  , BMI Body mass index is 32.43 kg/m.  Wt Readings from Last 3 Encounters:  04/27/23 219 lb 9.6 oz (99.6 kg)  04/21/23 220 lb (99.8 kg)  02/26/23 216 lb (98 kg)     GEN: Well nourished, well developed, in no acute distress. HEENT: normal. Neck:  Supple, no JVD, carotid bruits, or masses. Cardiac: RRR, no murmurs, rubs, or gallops. No clubbing, cyanosis, edema.  Radials/PT 2+ and equal bilaterally.  Respiratory:  Respirations regular and unlabored, clear to auscultation bilaterally. GI: Soft, nontender, nondistended. MS: No deformity or atrophy. Skin: Warm and dry, no rash. Neuro:  Strength and sensation are intact. Psych: Normal affect.  Assessment & Plan     Cardiac Symptoms Increased shortness of breath and chest pressure with prolonged standing and activity over the past several months. Previous stress test in December. Discussed the need for cardiac catheterization due to increased symptoms. -Schedule cardiac catheterization as soon as possible. -Continue monitoring symptoms.  Type 2 Diabetes Recent HbA1c of 7, increased from 6 the previous year. Currently managed with diet and exercise. -Encourage continued diet and exercise regimen.  Carotid Bruit Noted on previous examination, last ultrasound in 2016. -Order carotid ultrasound to assess for changes.  Borderline Hypotension Reports of occasional lightheadedness and decreased energy. Currently on low dose Ramipril. -Increase fluid intake, consider use of compression socks. -Monitor blood pressure at home and report if consistently low readings. -Consider discontinuing Ramipril if necessary.  Aortic Enlargement Stable at 4.0 cm, monitored annually with CT scan. -Continue annual monitoring with CT scan, next due in May.  Nitroglycerin Use Patient unsure when to use medication for chest pain. -Educate on appropriate use of nitroglycerin for chest pain. -Renew nitroglycerin prescription and advise on proper storage.  Post-Catheterization Plan Potential for cardiac rehab to increase activity levels and monitor heart health. -Discuss further after cardiac catheterization results.  Diabetes Mellitus -A1c is 6.0 in January -continue current medications  The  patient understands that risks include but are not limited to stroke (1 in 1000), death (1 in 1000), kidney failure [usually temporary] (1 in 500), bleeding (1 in 200), allergic reaction [possibly serious] (1 in 200), and agrees to proceed.  Disposition: Follow up after cath with  Verne Carrow, MD or APP.  Signed, Sharlene Dory, PA-C 04/27/2023, 12:49 PM [facility]

## 2023-04-27 ENCOUNTER — Ambulatory Visit: Payer: PPO | Attending: Physician Assistant | Admitting: Physician Assistant

## 2023-04-27 ENCOUNTER — Encounter: Payer: Self-pay | Admitting: Physician Assistant

## 2023-04-27 VITALS — BP 96/58 | HR 67 | Resp 93 | Ht 69.0 in | Wt 219.6 lb

## 2023-04-27 DIAGNOSIS — I5022 Chronic systolic (congestive) heart failure: Secondary | ICD-10-CM

## 2023-04-27 DIAGNOSIS — E11311 Type 2 diabetes mellitus with unspecified diabetic retinopathy with macular edema: Secondary | ICD-10-CM

## 2023-04-27 DIAGNOSIS — I712 Thoracic aortic aneurysm, without rupture, unspecified: Secondary | ICD-10-CM | POA: Diagnosis not present

## 2023-04-27 DIAGNOSIS — I7121 Aneurysm of the ascending aorta, without rupture: Secondary | ICD-10-CM | POA: Diagnosis not present

## 2023-04-27 DIAGNOSIS — I779 Disorder of arteries and arterioles, unspecified: Secondary | ICD-10-CM | POA: Diagnosis not present

## 2023-04-27 DIAGNOSIS — I428 Other cardiomyopathies: Secondary | ICD-10-CM | POA: Diagnosis not present

## 2023-04-27 DIAGNOSIS — E785 Hyperlipidemia, unspecified: Secondary | ICD-10-CM | POA: Diagnosis not present

## 2023-04-27 MED ORDER — NITROGLYCERIN 0.4 MG SL SUBL: 0.4000 mg | SUBLINGUAL_TABLET | SUBLINGUAL | 3 refills | Status: AC | PRN

## 2023-04-27 NOTE — Patient Instructions (Addendum)
Medication Instructions:   *If you need a refill on your cardiac medications before your next appointment, please call your pharmacy*   Lab Work:  If you have labs (blood work) drawn today and your tests are completely normal, you will receive your results only by: MyChart Message (if you have MyChart) OR A paper copy in the mail If you have any lab test that is abnormal or we need to change your treatment, we will call you to review the results.   Testing/Procedures:  Your physician has requested that you have a carotid duplex. This test is an ultrasound of the carotid arteries in your neck. It looks at blood flow through these arteries that supply the brain with blood. Allow one hour for this exam. There are no restrictions or special instructions.    [facility] A DEPT OF [place] AT [facility] [address] [place], [place] [address] [place] [address] Dept: [phone] Loc: [phone]  Dominic Kane  04/27/2023  You are scheduled for a Cardiac Catheterization on Friday, February 7 with Dr. Alverda Skeans.  1. Please arrive at the [facility] (Main Entrance A) at [facility]: [address] [place], [place] [address] at 10:00 AM (This time is 2 hour(s) before your procedure to ensure your preparation).   Free valet parking service is available. You will check in at ADMITTING. The support person will be asked to wait in the waiting room.  It is OK to have someone drop you off and come back when you are ready to be discharged.    Special note: Every effort is made to have your procedure done on time. Please understand that emergencies sometimes delay scheduled procedures.  2. Diet: Do not eat solid foods after midnight.  The patient may have clear liquids until 5am upon the day of the procedure.  3. Labs: Done ' 4. Medication instructions in preparation for your procedure:   Contrast Allergy: No  Stop taking,  Lasix (Furosemide)  Friday, February 7,  On the morning of your procedure, take your Aspirin 81 mg and any morning medicines NOT listed above.  You may use sips of water.  5. Plan to go home the same day, you will only stay overnight if medically necessary. 6. Bring a current list of your medications and current insurance cards. 7. You MUST have a responsible person to drive you home. 8. Someone MUST be with you the first 24 hours after you arrive home or your discharge will be delayed. 9. Please wear clothes that are easy to get on and off and wear slip-on shoes.  Thank you for allowing [place] to care for you!   -- [facility]   Follow-Up: At [facility], you and your health needs are our priority.  As part of our continuing mission to provide you with exceptional heart care, we have created designated Provider Care Teams.  These Care Teams include your primary Cardiologist (physician) and Advanced Practice Providers (APPs -  Physician Assistants and Nurse Practitioners) who all work together to provide you with the care you need, when you need it.  We recommend signing up for the patient portal called "MyChart".  Sign up information is provided on this After Visit Summary.  MyChart is used to connect with patients for Virtual Visits (Telemedicine).  Patients are able to view lab/test results, encounter notes, upcoming appointments, etc.  Non-urgent messages can be sent to your  provider as well.   To learn more about what you can do with MyChart, go to [email or web address].    Your next appointment:  4 weeks with APP post cath

## 2023-04-28 ENCOUNTER — Telehealth: Payer: Self-pay | Admitting: Internal Medicine

## 2023-04-28 ENCOUNTER — Telehealth: Payer: Self-pay

## 2023-04-28 DIAGNOSIS — Z01810 Encounter for preprocedural cardiovascular examination: Secondary | ICD-10-CM

## 2023-04-28 NOTE — Telephone Encounter (Signed)
Pt says he came in today to have his CBC drawn. Will follow for results.

## 2023-04-28 NOTE — Telephone Encounter (Signed)
Left a message for the pt to call back... opt need a CBC for his upcoming Cath.. lab order placed.

## 2023-04-28 NOTE — Telephone Encounter (Signed)
 Spoke with patient , questions answered

## 2023-04-28 NOTE — Telephone Encounter (Signed)
Patient would like to discuss recovery time for 2/07 left heart cath.

## 2023-04-29 ENCOUNTER — Ambulatory Visit: Payer: PPO

## 2023-04-29 ENCOUNTER — Encounter: Payer: Self-pay | Admitting: Physician Assistant

## 2023-04-29 VITALS — Ht 69.75 in | Wt 219.0 lb

## 2023-04-29 DIAGNOSIS — Z0000 Encounter for general adult medical examination without abnormal findings: Secondary | ICD-10-CM

## 2023-04-29 LAB — CBC
Hematocrit: 39.6 % (ref 37.5–51.0)
Hemoglobin: 13.2 g/dL (ref 13.0–17.7)
MCH: 30.5 pg (ref 26.6–33.0)
MCHC: 33.3 g/dL (ref 31.5–35.7)
MCV: 92 fL (ref 79–97)
Platelets: 165 10*3/uL (ref 150–450)
RBC: 4.33 x10E6/uL (ref 4.14–5.80)
RDW: 11.9 % (ref 11.6–15.4)
WBC: 8.8 10*3/uL (ref 3.4–10.8)

## 2023-04-29 NOTE — Progress Notes (Signed)
 Subjective:   Dominic Kane is a 83 y.o. male who presents for Medicare Annual/Subsequent preventive examination.  Visit Complete: Virtual I connected with  Dominic Kane on 04/29/23 by a audio enabled telemedicine application and verified that I am speaking with the correct person using two identifiers.  Patient Location: Home  Provider Location: Office/Clinic  I discussed the limitations of evaluation and management by telemedicine. The patient expressed understanding and agreed to proceed.  Vital Signs: Because this visit was a virtual/telehealth visit, some criteria may be missing or patient reported. Any vitals not documented were not able to be obtained and vitals that have been documented are patient reported.  Patient Medicare AWV questionnaire was completed by the patient on (not done); I have confirmed that all information answered by patient is correct and no changes since this date.  Cardiac Risk Factors include: advanced age (>69men, >53 women);dyslipidemia;diabetes mellitus;hypertension;male gender;obesity (BMI >30kg/m2);sedentary lifestyle     Objective:    Today's Vitals   04/29/23 1311 04/29/23 1312  Weight: 219 lb (99.3 kg)   Height: 5' 9.75 (1.772 m)   PainSc:  3    Body mass index is 31.65 kg/m.     04/29/2023    1:22 PM 04/28/2022    2:05 PM 03/09/2022    8:33 AM 03/08/2022   12:01 PM 03/07/2022    9:31 AM 03/01/2022    5:00 PM 04/25/2021    2:04 PM  Advanced Directives  Does Patient Have a Medical Advance Directive? Yes Yes No Yes No Yes Yes  Type of Estate Agent of [place];Living will Healthcare Power of [place];Living will  Healthcare Power of [place];Living will  Healthcare Power of [place] of [place];Living will  Does patient want to make changes to medical advance directive?  No - Patient declined No - Patient declined   No - Patient declined Yes (MAU/Ambulatory/Procedural Areas - Information given)   Copy of Healthcare Power of Attorney in Chart? No - copy requested No - copy requested No - copy requested No - copy requested     Would patient like information on creating a medical advance directive?   No - Patient declined  No - Patient declined      Current Medications (verified) Outpatient Encounter Medications as of 04/29/2023  Medication Sig   Accu-Chek Softclix Lancets lancets Use to check blood sugar once daily   acetaminophen  (TYLENOL ) 500 MG tablet Take 1,000 mg by mouth every 8 (eight) hours as needed for moderate pain (pain score 4-6).   aspirin  EC 81 MG tablet Take 1 tablet (81 mg total) by mouth daily. Swallow whole.   Chlorpheniramine-DM (CORICIDIN HBP COUGH/COLD PO) Take 1 tablet by mouth daily as needed (congestion).   Cholecalciferol (VITAMIN D -3) 125 MCG (5000 UT) TABS Take 5,000 Units by mouth at bedtime.   ciprofloxacin (CILOXAN) 0.3 % ophthalmic solution Place 2 drops into both eyes as needed (For Eye Injections).   ferrous sulfate  325 (65 FE) MG EC tablet 1 tablet with food every other day   finasteride  (PROSCAR ) 5 MG tablet Take 1 tablet (5 mg total) by mouth daily.   fluticasone (FLONASE) 50 MCG/ACT nasal spray Place 1 spray into both nostrils daily as needed for allergies or rhinitis.   furosemide  (LASIX ) 40 MG tablet Take 1 tablet (40 mg total) by mouth daily as needed. (Patient taking differently: Take 40 mg by mouth daily.)   guaiFENesin  (MUCINEX ) 600 MG 12 hr tablet Take 600 mg by mouth 2 (  two) times daily as needed for cough.   Lancets (ONETOUCH DELICA PLUS LANCET33G) MISC    levothyroxine  (SYNTHROID ) 150 MCG tablet TAKE 1 TABLET BY MOUTH EVERY DAY BEFORE BREAKFAST   Melatonin 5 MG CAPS Take 5 mg by mouth at bedtime.   Multiple Vitamins-Minerals (PRESERVISION AREDS 2 PO) Take 1 tablet by mouth in the morning and at bedtime.   nitroGLYCERIN  (NITROSTAT ) 0.4 MG SL tablet Place 1 tablet (0.4 mg total) under the tongue every 5 (five) minutes as needed for chest pain.    omeprazole  (PRILOSEC) 20 MG capsule Take 1 capsule (20 mg total) by mouth daily.   Phenylephrine -DM-GG-APAP (TYLENOL  COLD/FLU SEVERE PO) Take 2 tablets by mouth daily as needed (cold symptoms/congestion). For High Blood Pressure   polyethylene glycol (MIRALAX  / GLYCOLAX ) 17 g packet Take 17 g by mouth daily.   PRESCRIPTION MEDICATION every 6 (six) months. *Antibiotic Injection in Eye*   ramipril  (ALTACE ) 2.5 MG capsule TAKE 1 CAPSULE BY MOUTH EVERY DAY   rosuvastatin  (CRESTOR ) 10 MG tablet Take 1 tablet (10 mg total) by mouth daily.   terazosin  (HYTRIN ) 10 MG capsule Take 1 capsule (10 mg total) by mouth at bedtime.   No facility-administered encounter medications on file as of 04/29/2023.    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   Displacement of lumbar intervertebral disc without myelopathy    Diverticulosis of colon (without mention of hemorrhage)    Dysfunction of eustachian tube    Hard of hearingbilateral hearing aids   Enlarged prostate    GERD (gastroesophageal reflux disease)    Headache(784.0)    History of kidney stones    past hx. 15 yrs ago x1   Hypertrophy of prostate without urinary obstruction and other lower urinary tract symptoms (LUTS)    Lipoprotein deficiencies    Other and unspecified hyperlipidemia    Other primary cardiomyopathies    Pain in joint, lower leg    Personal history of colonic  polyps    Personal history of other diseases of digestive system    Pes anserinus tendinitis or bursitis    left shoulder remains an issue   Sinoatrial node dysfunction (HCC)    Dr. 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 Shona   CATARACT EXTRACTION, BILATERAL     COLONOSCOPY W/ POLYPECTOMY     COLONOSCOPY WITH PROPOFOL  N/A 03/03/2022   Procedure: COLONOSCOPY WITH PROPOFOL ;  Surgeon: Leigh Elspeth SQUIBB, MD;  Location[facility] END[facility]COPY;  Service: Gastroenterology;  Laterality: N/A;   COLONOSCOPY WITH PROPOFOL  N/A 03/04/2022  Procedure: COLONOSCOPY WITH PROPOFOL ;  Surgeon: Leigh Elspeth SQUIBB, MD;  Location: [facility] ENDOSCOPY;  Service: Gastroenterology;  Laterality: N/A;   COLONOSCOPY WITH PROPOFOL  N/A 03/08/2022   Procedure: COLONOSCOPY WITH PROPOFOL ;  Surgeon: Leigh Elspeth SQUIBB, MD;  Location: [facility] ENDOSCOPY;  Service: Gastroenterology;  Laterality: N/A;   DOBUTAMINE  STRESS ECHO  1/07   Lateral hypokinesis but no ischemia   ESOPHAGOGASTRODUODENOSCOPY  11/07   Schatzki's ring, non bleeding erosive gastropathy Charlotte Chitina   ESOPHAGOGASTRODUODENOSCOPY (EGD) WITH PROPOFOL  N/A 03/02/2022   Procedure: ESOPHAGOGASTRODUODENOSCOPY (EGD) WITH PROPOFOL ;  Surgeon: Shila Gustav GAILS, MD;  Location: [facility] ENDOSCOPY;  Service: Gastroenterology;  Laterality: N/A;   FLEXIBLE SIGMOIDOSCOPY N/A 03/07/2022   Procedure: FLEXIBLE SIGMOIDOSCOPY;  Surgeon: Leigh Elspeth SQUIBB, MD;  Location: [facility] ENDOSCOPY;  Service: Gastroenterology;  Laterality: N/A;   GIVENS CAPSULE STUDY N/A 03/05/2022   Procedure: GIVENS CAPSULE STUDY;  Surgeon: Leigh Elspeth SQUIBB, MD;  Location: [facility] END[facility]COPY;  Service:  Gastroenterology;  Laterality: N/A;   KNEE SURGERY Left 1947   LYMPH NODE DISSECTION N/A 08/05/2016   Procedure: LIMITED LYMPH NODE DISSECTION;  Surgeon: Eletha Boas, MD;  Location: [facility] ORS;  Service: General;  Laterality: N/A;   Pulmonary functioning tests  1. 2003  2. 2005   1. Diminished lung capacity  2. Stable   THYROIDECTOMY N/A 08/05/2016   Procedure: TOTAL THYROIDECTOMY;  Surgeon: Eletha Boas, MD;  Location: [facility] ORS;  Service: General;  Laterality: N/A;   TONSILLECTOMY     TOTAL KNEE ARTHROPLASTY Left 03/18/2016   Procedure: LEFT TOTAL KNEE ARTHROPLASTY;  Surgeon: Donnice Car, MD;  Location: [facility] ORS;  Service: Orthopedics;  Laterality: Left;   Family History  Problem Relation Age of Onset   Heart failure Mother    Coronary artery disease Mother    Stroke Mother    Hypothyroidism Mother    Cancer Father        LUNG   Cancer Sister        BREAST   Heart failure Maternal Grandmother    Hypothyroidism Daughter    Colon cancer Neg Hx    Social History   Socioeconomic History   Marital status: Married    Spouse name: Not on file   Number of children: 1   Years of education: Not on file   Highest education level: Not on file  Occupational History   Occupation: retired Cabin Crew: RETIRED  Tobacco Use   Smoking status: Former    Current packs/day: 0.00    Types: Cigarettes    Quit date: 07/03/1980    Years since quitting: 42.8   Smokeless tobacco: Never  Vaping Use   Vaping status: Never Used  Substance and Sexual Activity   Alcohol use: Yes    Alcohol/week: 0.0 standard drinks of alcohol    Comment: occassionally   Drug use: No   Sexual activity: Not on file  Other Topics Concern   Not on file  Social History Narrative   Married 1 daughter   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   2 caffeinated drinks a day no alcohol tobacco or drug use    former smoker   Social Drivers of Corporate Investment Banker  Strain: Low Risk  (04/29/2023)   Overall Financial Resource Strain (CARDIA)    Difficulty of Paying Living Expenses: Not hard at all  Food Insecurity: No Food Insecurity (04/29/2023)   Hunger Vital Sign    Worried About Running  Out of Food in the Last Year: Never true    Ran Out of Food in the Last Year: Never true  Transportation Needs: No Transportation Needs (04/29/2023)   PRAPARE - Administrator, Civil Service (Medical): No    Lack of Transportation (Non-Medical): No  Physical Activity: Sufficiently Active (04/29/2023)   Exercise Vital Sign    Days of Exercise per Week: 5 days    Minutes of Exercise per Session: 30 min  Stress: No Stress Concern Present (04/29/2023)   [employer] of Occupational Health - Occupational Stress Questionnaire    Feeling of Stress : Only a little  Social Connections: Moderately Integrated (04/29/2023)   Social Connection and Isolation Panel [NHANES]    Frequency of Communication with Friends and Family: More than three times a week    Frequency of Social Gatherings with Friends and Family: More than three times a week    Attends Religious Services: More than 4 times per year    Active Member of [employer] or Organizations: No    Attends Engineer, Structural: Never    Marital Status: Married    Tobacco Counseling Counseling given: Not Answered   Clinical Intake:  Pre-visit preparation completed: Yes  Pain : 0-10 Pain Score: 3  Pain Type: Chronic pain Pain Location: Chest Pain Orientation: Upper Pain Descriptors / Indicators: Discomfort (pressure if standing a long time) Pain Onset: More than a month ago Pain Frequency: Constant     BMI - recorded: 31.65 Nutritional Status: BMI > 30  Obese Nutritional Risks: None Diabetes: Yes CBG done?: Yes (BS 105 this am at home) CBG resulted in Enter/ Edit results?: No Did pt. bring in CBG monitor from home?: No  How often do you need to have someone help you when you read  instructions, pamphlets, or other written materials from your doctor or pharmacy?: 1 - Never  Interpreter Needed?: No  Comments: lives with wife Information entered by :: B.Aijah Lattner,LPN   Activities of Daily Living    04/29/2023    1:25 PM  In your present state of health, do you have any difficulty performing the following activities:  Hearing? 0  Vision? 1  Difficulty concentrating or making decisions? 0  Walking or climbing stairs? 1  Dressing or bathing? 0  Doing errands, shopping? 0  Preparing Food and eating ? N  Using the Toilet? N  In the past six months, have you accidently leaked urine? N  Do you have problems with loss of bowel control? N  Managing your Medications? N  Managing your Finances? N  Housekeeping or managing your Housekeeping? N    Patient Care Team: Avelina Greig BRAVO, MD as PCP - General (Family Medicine) Verlin Lonni BIRCH, MD as PCP - Cardiology (Cardiology) Myra Rosaline FALCON, [facility] (Inactive) as Pharmacist (Pharmacist)  Indicate any recent Medical Services you may have received from other than [facility] providers in the past year (date may be approximate).     Assessment:   This is a routine wellness examination for Silvia.  Hearing/Vision screen Hearing Screening - Comments:: Pt says hearing good with aids Vision Screening - Comments:: Rt eye 20/20-25 [facility];left 20/50-20/75 with glasses Dr Janann   Goals Addressed             This Visit's Progress    Health management   Not on track    04/29/2023--I will continue to walk 3 hours daily, drink 9-10 glasses of water  daily, and to take medications as prescribed.  Maintain Healthy Lifestyle   On track    04/29/23- Stay active Healthy diet     COMPLETED: Patient Stated   On track    02/16/2019, I will try to keep glucose in normal range and lose some weight.        Depression Screen    04/29/2023    1:20 PM 08/14/2022   10:59 AM 04/28/2022    2:12 PM 04/25/2021    2:08 PM  04/24/2020    2:05 PM 03/30/2020   11:55 AM 02/16/2019   12:04 PM  PHQ 2/9 Scores  PHQ - 2 Score 0 0 0 0 0 0 0  PHQ- 9 Score     0  0    Fall Risk    04/29/2023    1:17 PM 08/14/2022   10:54 AM 04/28/2022    2:13 PM 04/25/2021    2:06 PM 04/24/2020    2:04 PM  Fall Risk   Falls in the past year? 0 1 0 0 0  Number falls in past yr: 0 1 0 0 0  Injury with Fall? 0 0  0 0  Risk for fall due to : Impaired balance/gait History of fall(s)  Other (Comment) Medication side effect  Risk for fall due to: Comment    tripped   Follow up Education provided;Falls prevention discussed Falls evaluation completed Falls evaluation completed;Falls prevention discussed Falls prevention discussed Falls evaluation completed;Falls prevention discussed    MEDICARE RISK AT HOME: Medicare Risk at Home Any stairs in or around the home?: No If so, are there any without handrails?: No Home free of loose throw rugs in walkways, pet beds, electrical cords, etc?: Yes Adequate lighting in your home to reduce risk of falls?: Yes Life alert?: No Use of a cane, walker or w/c?: No Grab bars in the bathroom?: Yes Shower chair or bench in shower?: Yes Elevated toilet seat or a handicapped toilet?: Yes  TIMED UP AND GO:  Was the test performed?  No    Cognitive Function:    04/24/2020    2:11 PM 02/16/2019   12:07 PM 02/09/2018   12:46 PM 01/05/2017    8:17 AM  MMSE - Mini Mental State Exam  Not completed: Refused     Orientation to time  5 5 5   Orientation to Place  5 5 5   Registration  3 3 3   Attention/ Calculation  3 0 0  Recall  3 3 3   Language- name 2 objects   0 0  Language- repeat  1 1 1   Language- follow 3 step command   3 3  Language- read & follow direction   0 0  Write a sentence   0 0  Copy design   0 0  Total score   20 20        04/29/2023    1:26 PM 04/28/2022    2:14 PM  6CIT Screen  What Year? 0 points 0 points  What month? 0 points 0 points  What time? 0 points 0 points  Count back from  20 0 points 0 points  Months in reverse 0 points 0 points  Repeat phrase 0 points 0 points  Total Score 0 points 0 points    Immunizations Immunization History  Administered Date(s) Administered   Fluad Quad(high Dose 65+) 11/17/2018   Influenza Whole 12/23/2006, 12/09/2007, 12/22/2008   Influenza, High Dose Seasonal PF 11/17/2014, 12/12/2015, 12/18/2016, 12/22/2017, 12/07/2019, 11/07/2020, 10/31/2021, 11/26/2022   Influenza,inj,Quad PF,6+ Mos 01/06/2013  Influenza-Unspecified 11/22/2013   Moderna Covid-19 Vaccine Bivalent Booster 75yrs & up 01/22/2021   Moderna Sars-Covid-2 Vaccination 04/06/2019, 05/04/2019, 02/20/2020   PNEUMOCOCCAL CONJUGATE-20 11/07/2020   Pfizer(Comirnaty)Fall Seasonal Vaccine 12 years and older 11/26/2022   Pneumococcal Conjugate-13 10/12/2014   Pneumococcal Polysaccharide-23 12/23/2006   Td 03/24/2001   Tdap 11/17/2014   Zoster Recombinant(Shingrix) 01/02/2022, 04/25/2022   Zoster, Live 06/23/2014    TDAP status: Up to date  Flu Vaccine status: Up to date [facility] in August 24  Pneumococcal vaccine status: Up to date  Covid-19 vaccine status: Completed vaccines  Qualifies for Shingles Vaccine? Yes   Zostavax completed Yes   Shingrix Completed?: Yes  Screening Tests Health Maintenance  Topic Date Due   COVID-19 Vaccine (6 - 2024-25 season) 01/21/2023   HEMOGLOBIN A1C  10/12/2023   Diabetic kidney evaluation - Urine ACR  10/16/2023   FOOT EXAM  10/16/2023   OPHTHALMOLOGY EXAM  03/25/2024   Diabetic kidney evaluation - eGFR measurement  04/13/2024   Medicare Annual Wellness (AWV)  04/28/2024   DTaP/Tdap/Td (3 - Td or Tdap) 11/16/2024   Pneumonia Vaccine 88+ Years old  Completed   INFLUENZA VACCINE  Completed   Zoster Vaccines- Shingrix  Completed   HPV VACCINES  Aged Out   Colonoscopy  Discontinued    Health Maintenance  Health Maintenance Due  Topic Date Due   COVID-19 Vaccine (6 - 2024-25 season) 01/21/2023    Colorectal cancer  screening: No longer required.   Lung Cancer Screening: (Low Dose CT Chest recommended if Age 11-80 years, 20 pack-year currently smoking OR have quit w/in 15years.) does not qualify.   Lung Cancer Screening Referral: no  Additional Screening:  Hepatitis C Screening: does not qualify; Completed none  Vision Screening: Recommended annual ophthalmology exams for early detection of glaucoma and other disorders of the eye. Is the patient up to date with their annual eye exam?  Yes  Who is the provider or what is the name of the office in which the patient attends annual eye exams? Dr Alvia If pt is not established with a provider, would they like to be referred to a provider to establish care? No .   Dental Screening: Recommended annual dental exams for proper oral hygiene  Diabetic Foot Exam: Diabetic Foot Exam: Completed 10/16/2022  Community Resource Referral / Chronic Care Management: CRR required this visit?  No   CCM required this visit?  No    Plan:     I have personally reviewed and noted the following in the patient's chart:   Medical and social history Use of alcohol, tobacco or illicit drugs  Current medications and supplements including opioid prescriptions. Patient is not currently taking opioid prescriptions. Functional ability and status Nutritional status Physical activity Advanced directives List of other physicians Hospitalizations, surgeries, and ER visits in previous 12 months Vitals Screenings to include cognitive, depression, and falls Referrals and appointments  In addition, I have reviewed and discussed with patient certain preventive protocols, quality metrics, and best practice recommendations. A written personalized care plan for preventive services as well as general preventive health recommendations were provided to patient.    Erminio LITTIE Saris, LPN   09/24/7972   After Visit Summary: (MyChart) Due to this being a telephonic visit, the after  visit summary with patients personalized plan was offered to patient via MyChart   Nurse Notes: pt says he is doing alright other than what pt calls some recent chest discomfort He relays if he stands along time  it feels like pressure in his upper chest. He relays he has a procedure with his cardiologist to address this. Pt has no other concerns or questions.

## 2023-04-29 NOTE — Patient Instructions (Signed)
 Dominic Kane , Thank you for taking time to come for your Medicare Wellness Visit. I appreciate your ongoing commitment to your health goals. Please review the following plan we discussed and let me know if I can assist you in the future.   Referrals/Orders/Follow-Ups/Clinician Recommendations: none  This is a list of the screening recommended for you and due dates:  Health Maintenance  Topic Date Due   COVID-19 Vaccine (6 - 2024-25 season) 01/21/2023   Hemoglobin A1C  10/12/2023   Yearly kidney health urinalysis for diabetes  10/16/2023   Complete foot exam   10/16/2023   Eye exam for diabetics  03/25/2024   Yearly kidney function blood test for diabetes  04/13/2024   Medicare Annual Wellness Visit  04/28/2024   DTaP/Tdap/Td vaccine (3 - Td or Tdap) 11/16/2024   Pneumonia Vaccine  Completed   Flu Shot  Completed   Zoster (Shingles) Vaccine  Completed   HPV Vaccine  Aged Out   Colon Cancer Screening  Discontinued    Advanced directives: (Copy Requested) Please bring a copy of your health care power of attorney and living will to the office to be added to your chart at your convenience.  Next Medicare Annual Wellness Visit scheduled for next year: Yes 04/29/2024 @ 1pm televisit

## 2023-04-30 ENCOUNTER — Telehealth: Payer: Self-pay | Admitting: *Deleted

## 2023-04-30 NOTE — Telephone Encounter (Signed)
Pt returning a call to nurse. °

## 2023-04-30 NOTE — Telephone Encounter (Signed)
Reviewed procedure instructions with patient's wife (DPR).

## 2023-04-30 NOTE — Telephone Encounter (Signed)
 Cardiac Catheterization scheduled at [facility] for: Friday May 01, 2023 12 Noon Arrival time [facility] Main Entrance A at: 10 AM  Nothing to eat after midnight prior to procedure, clear liquids until 5 AM day of procedure.  Medication instructions: -Hold:  Lasix -AM of procedure  -Other usual morning medications can be taken with sips of water  including aspirin  81 mg.  Plan to go home the same day, you will only stay overnight if medically necessary.  You must have responsible adult to drive you home.  Someone must be with you the first 24 hours after you arrive home.   Left message for patient to call back to review procedure instructions

## 2023-05-01 ENCOUNTER — Encounter (HOSPITAL_COMMUNITY): Payer: Self-pay | Admitting: Internal Medicine

## 2023-05-01 ENCOUNTER — Other Ambulatory Visit: Payer: Self-pay

## 2023-05-01 ENCOUNTER — Ambulatory Visit (HOSPITAL_COMMUNITY)
Admission: RE | Admit: 2023-05-01 | Discharge: 2023-05-01 | Disposition: A | Discharge status: Home / Self Care | Payer: PPO | Attending: Internal Medicine | Admitting: Internal Medicine

## 2023-05-01 ENCOUNTER — Encounter (HOSPITAL_COMMUNITY)
Admission: RE | Disposition: A | Discharge status: Home / Self Care | Payer: PPO | Source: Home / Self Care | Attending: Internal Medicine

## 2023-05-01 DIAGNOSIS — I428 Other cardiomyopathies: Secondary | ICD-10-CM | POA: Diagnosis not present

## 2023-05-01 DIAGNOSIS — R0989 Other specified symptoms and signs involving the circulatory and respiratory systems: Secondary | ICD-10-CM | POA: Diagnosis not present

## 2023-05-01 DIAGNOSIS — I452 Bifascicular block: Secondary | ICD-10-CM | POA: Insufficient documentation

## 2023-05-01 DIAGNOSIS — E785 Hyperlipidemia, unspecified: Secondary | ICD-10-CM | POA: Diagnosis not present

## 2023-05-01 DIAGNOSIS — R079 Chest pain, unspecified: Secondary | ICD-10-CM | POA: Diagnosis not present

## 2023-05-01 DIAGNOSIS — I7121 Aneurysm of the ascending aorta, without rupture: Secondary | ICD-10-CM | POA: Diagnosis not present

## 2023-05-01 DIAGNOSIS — I5042 Chronic combined systolic (congestive) and diastolic (congestive) heart failure: Secondary | ICD-10-CM | POA: Diagnosis not present

## 2023-05-01 DIAGNOSIS — Z8679 Personal history of other diseases of the circulatory system: Secondary | ICD-10-CM | POA: Insufficient documentation

## 2023-05-01 DIAGNOSIS — I110 Hypertensive heart disease with heart failure: Secondary | ICD-10-CM | POA: Diagnosis not present

## 2023-05-01 DIAGNOSIS — I2510 Atherosclerotic heart disease of native coronary artery without angina pectoris: Secondary | ICD-10-CM | POA: Diagnosis not present

## 2023-05-01 DIAGNOSIS — I959 Hypotension, unspecified: Secondary | ICD-10-CM | POA: Diagnosis not present

## 2023-05-01 DIAGNOSIS — N400 Enlarged prostate without lower urinary tract symptoms: Secondary | ICD-10-CM | POA: Insufficient documentation

## 2023-05-01 DIAGNOSIS — E119 Type 2 diabetes mellitus without complications: Secondary | ICD-10-CM | POA: Insufficient documentation

## 2023-05-01 DIAGNOSIS — Z79899 Other long term (current) drug therapy: Secondary | ICD-10-CM | POA: Insufficient documentation

## 2023-05-01 HISTORY — PX: RIGHT HEART CATH AND CORONARY ANGIOGRAPHY: CATH118264

## 2023-05-01 HISTORY — PX: CORONARY PRESSURE/FFR STUDY: CATH118243

## 2023-05-01 LAB — POCT I-STAT 7, (LYTES, BLD GAS, ICA,H+H)
Acid-base deficit: 3 mmol/L — ABNORMAL HIGH (ref 0.0–2.0)
Bicarbonate: 22.1 mmol/L (ref 20.0–28.0)
Calcium, Ion: 1.16 mmol/L (ref 1.15–1.40)
HCT: 37 % — ABNORMAL LOW (ref 39.0–52.0)
Hemoglobin: 12.6 g/dL — ABNORMAL LOW (ref 13.0–17.0)
O2 Saturation: 95 %
Potassium: 4.1 mmol/L (ref 3.5–5.1)
Sodium: 140 mmol/L (ref 135–145)
TCO2: 23 mmol/L (ref 22–32)
pCO2 arterial: 37.2 mm[Hg] (ref 32–48)
pH, Arterial: 7.383 (ref 7.35–7.45)
pO2, Arterial: 75 mm[Hg] — ABNORMAL LOW (ref 83–108)

## 2023-05-01 LAB — POCT I-STAT EG7
Acid-base deficit: 1 mmol/L (ref 0.0–2.0)
Acid-base deficit: 5 mmol/L — ABNORMAL HIGH (ref 0.0–2.0)
Bicarbonate: 21.4 mmol/L (ref 20.0–28.0)
Bicarbonate: 24.1 mmol/L (ref 20.0–28.0)
Calcium, Ion: 1.1 mmol/L — ABNORMAL LOW (ref 1.15–1.40)
Calcium, Ion: 1.23 mmol/L (ref 1.15–1.40)
HCT: 36 % — ABNORMAL LOW (ref 39.0–52.0)
HCT: 37 % — ABNORMAL LOW (ref 39.0–52.0)
Hemoglobin: 12.2 g/dL — ABNORMAL LOW (ref 13.0–17.0)
Hemoglobin: 12.6 g/dL — ABNORMAL LOW (ref 13.0–17.0)
O2 Saturation: 65 %
O2 Saturation: 68 %
Potassium: 4 mmol/L (ref 3.5–5.1)
Potassium: 4.2 mmol/L (ref 3.5–5.1)
Sodium: 132 mmol/L — ABNORMAL LOW (ref 135–145)
Sodium: 140 mmol/L (ref 135–145)
TCO2: 23 mmol/L (ref 22–32)
TCO2: 25 mmol/L (ref 22–32)
pCO2, Ven: 41.9 mm[Hg] — ABNORMAL LOW (ref 44–60)
pCO2, Ven: 42.5 mm[Hg] — ABNORMAL LOW (ref 44–60)
pH, Ven: 7.311 (ref 7.25–7.43)
pH, Ven: 7.367 (ref 7.25–7.43)
pO2, Ven: 35 mm[Hg] (ref 32–45)
pO2, Ven: 39 mm[Hg] (ref 32–45)

## 2023-05-01 LAB — GLUCOSE, CAPILLARY: Glucose-Capillary: 109 mg/dL — ABNORMAL HIGH (ref 70–99)

## 2023-05-01 LAB — POCT ACTIVATED CLOTTING TIME: Activated Clotting Time: 227 s

## 2023-05-01 SURGERY — RIGHT HEART CATH AND CORONARY ANGIOGRAPHY
Anesthesia: LOCAL

## 2023-05-01 MED ORDER — ACETAMINOPHEN 325 MG PO TABS: 650.0000 mg | ORAL_TABLET | ORAL | Status: DC | PRN

## 2023-05-01 MED ORDER — MIDAZOLAM HCL 2 MG/2ML IJ SOLN
INTRAMUSCULAR | Status: AC
  Filled 2023-05-01: qty 2

## 2023-05-01 MED ORDER — SODIUM CHLORIDE 0.9 % IV SOLN
INTRAVENOUS | Status: DC
Start: 2023-05-01 — End: 2023-05-01

## 2023-05-01 MED ORDER — SODIUM CHLORIDE 0.9% FLUSH: 3.0000 mL | Freq: Two times a day (BID) | INTRAVENOUS | Status: DC

## 2023-05-01 MED ORDER — HYDRALAZINE HCL 20 MG/ML IJ SOLN: 10.0000 mg | INTRAMUSCULAR | Status: DC | PRN

## 2023-05-01 MED ORDER — ADENOSINE 6 MG/2ML IV SOLN
INTRAVENOUS | Status: AC
  Filled 2023-05-01: qty 8

## 2023-05-01 MED ORDER — HEPARIN SODIUM (PORCINE) 1000 UNIT/ML IJ SOLN
INTRAMUSCULAR | Status: AC
  Filled 2023-05-01: qty 10

## 2023-05-01 MED ORDER — LABETALOL HCL 5 MG/ML IV SOLN: 10.0000 mg | INTRAVENOUS | Status: DC | PRN

## 2023-05-01 MED ORDER — SODIUM CHLORIDE 0.9% FLUSH: 3.0000 mL | INTRAVENOUS | Status: DC | PRN

## 2023-05-01 MED ORDER — LIDOCAINE HCL (PF) 1 % IJ SOLN
INTRAMUSCULAR | Status: DC | PRN
  Administered 2023-05-01: 5 mL

## 2023-05-01 MED ORDER — ONDANSETRON HCL 4 MG/2ML IJ SOLN: 4.0000 mg | Freq: Four times a day (QID) | INTRAMUSCULAR | Status: DC | PRN

## 2023-05-01 MED ORDER — HEPARIN (PORCINE) IN NACL 1000-0.9 UT/500ML-% IV SOLN
INTRAVENOUS | Status: DC | PRN
  Administered 2023-05-01 (×2): 500 mL

## 2023-05-01 MED ORDER — IOHEXOL 350 MG/ML SOLN
INTRAVENOUS | Status: DC | PRN
  Administered 2023-05-01: 85 mL

## 2023-05-01 MED ORDER — ASPIRIN 81 MG PO CHEW: 81.0000 mg | CHEWABLE_TABLET | ORAL | Status: DC

## 2023-05-01 MED ORDER — FENTANYL CITRATE (PF) 100 MCG/2ML IJ SOLN
INTRAMUSCULAR | Status: AC
  Filled 2023-05-01: qty 2

## 2023-05-01 MED ORDER — MIDAZOLAM HCL 2 MG/2ML IJ SOLN
INTRAMUSCULAR | Status: DC | PRN
  Administered 2023-05-01 (×2): 1 mg via INTRAVENOUS

## 2023-05-01 MED ORDER — VERAPAMIL HCL 2.5 MG/ML IV SOLN
INTRAVENOUS | Status: DC | PRN
  Administered 2023-05-01: 10 mL via INTRA_ARTERIAL

## 2023-05-01 MED ORDER — ADENOSINE 12 MG/4ML IV SOLN
INTRAVENOUS | Status: AC
  Filled 2023-05-01: qty 8

## 2023-05-01 MED ORDER — LIDOCAINE HCL (PF) 1 % IJ SOLN
INTRAMUSCULAR | Status: AC
  Filled 2023-05-01: qty 30

## 2023-05-01 MED ORDER — SODIUM CHLORIDE 0.9 % IV SOLN: INTRAVENOUS | Status: DC

## 2023-05-01 MED ORDER — ADENOSINE (DIAGNOSTIC) 140MCG/KG/MIN
INTRAVENOUS | Status: DC | PRN
  Administered 2023-05-01: 140 ug/kg/min via INTRAVENOUS

## 2023-05-01 MED ORDER — VERAPAMIL HCL 2.5 MG/ML IV SOLN
INTRAVENOUS | Status: AC
  Filled 2023-05-01: qty 2

## 2023-05-01 MED ORDER — FENTANYL CITRATE (PF) 100 MCG/2ML IJ SOLN
INTRAMUSCULAR | Status: DC | PRN
  Administered 2023-05-01 (×2): 25 ug via INTRAVENOUS

## 2023-05-01 MED ORDER — SODIUM CHLORIDE 0.9 % IV SOLN: 250.0000 mL | INTRAVENOUS | Status: DC | PRN

## 2023-05-01 MED ORDER — HEPARIN SODIUM (PORCINE) 1000 UNIT/ML IJ SOLN
INTRAMUSCULAR | Status: DC | PRN
  Administered 2023-05-01 (×2): 5000 [IU] via INTRAVENOUS

## 2023-05-01 SURGICAL SUPPLY — 14 items
CATH BALLN WEDGE 5F 110CM (CATHETERS) IMPLANT
CATH INFINITI 5FR ANG PIGTAIL (CATHETERS) IMPLANT
CATH INFINITI AMBI 6FR TG (CATHETERS) IMPLANT
CATH INFINITI JR4 5F (CATHETERS) IMPLANT
CATH LAUNCHER 6FR EBU 3 (CATHETERS) IMPLANT
CATH LAUNCHER 6FR EBU3.5 (CATHETERS) IMPLANT
GLIDESHEATH SLEND SS 6F .021 (SHEATH) IMPLANT
GUIDEWIRE PRESSURE X 175 (WIRE) IMPLANT
KIT ESSENTIALS PG (KITS) IMPLANT
PACK CARDIAC CATHETERIZATION (CUSTOM PROCEDURE TRAY) ×2 IMPLANT
SET ATX-X65L (MISCELLANEOUS) IMPLANT
SHEATH 6FR 75 DEST SLENDER (SHEATH) IMPLANT
SHEATH GLIDE SLENDER 4/5FR (SHEATH) IMPLANT
WIRE EMERALD 3MM-J .035X260CM (WIRE) IMPLANT

## 2023-05-01 NOTE — Interval H&P Note (Signed)
 History and Physical Interval Note:  05/01/2023 10:51 AM  Dominic Kane  has presented today for surgery, with the diagnosis of chest pain.  The various methods of treatment have been discussed with the patient and family. After consideration of risks, benefits and other options for treatment, the patient has consented to  Procedure(s): RIGHT/LEFT HEART CATH AND CORONARY ANGIOGRAPHY (N/A) as a surgical intervention.  The patient's history has been reviewed, patient examined, no change in status, stable for surgery.  I have reviewed the patient's chart and labs.  Questions were answered to the patient's satisfaction.     Unknown Schleyer K Sally Menard

## 2023-05-04 ENCOUNTER — Encounter (INDEPENDENT_AMBULATORY_CARE_PROVIDER_SITE_OTHER): Payer: PPO | Admitting: Ophthalmology

## 2023-05-04 DIAGNOSIS — H33303 Unspecified retinal break, bilateral: Secondary | ICD-10-CM

## 2023-05-04 DIAGNOSIS — I10 Essential (primary) hypertension: Secondary | ICD-10-CM

## 2023-05-04 DIAGNOSIS — H35033 Hypertensive retinopathy, bilateral: Secondary | ICD-10-CM

## 2023-05-04 DIAGNOSIS — H34812 Central retinal vein occlusion, left eye, with macular edema: Secondary | ICD-10-CM

## 2023-05-04 DIAGNOSIS — H353112 Nonexudative age-related macular degeneration, right eye, intermediate dry stage: Secondary | ICD-10-CM | POA: Diagnosis not present

## 2023-05-04 DIAGNOSIS — H43813 Vitreous degeneration, bilateral: Secondary | ICD-10-CM

## 2023-05-14 ENCOUNTER — Ambulatory Visit (HOSPITAL_COMMUNITY)
Admission: RE | Admit: 2023-05-14 | Discharge: 2023-05-14 | Disposition: A | Discharge status: Home / Self Care | Payer: PPO | Source: Ambulatory Visit | Attending: Internal Medicine | Admitting: Internal Medicine

## 2023-05-14 DIAGNOSIS — R0989 Other specified symptoms and signs involving the circulatory and respiratory systems: Secondary | ICD-10-CM | POA: Diagnosis not present

## 2023-05-14 DIAGNOSIS — I5022 Chronic systolic (congestive) heart failure: Secondary | ICD-10-CM | POA: Diagnosis not present

## 2023-05-14 DIAGNOSIS — I712 Thoracic aortic aneurysm, without rupture, unspecified: Secondary | ICD-10-CM | POA: Insufficient documentation

## 2023-05-14 DIAGNOSIS — I7121 Aneurysm of the ascending aorta, without rupture: Secondary | ICD-10-CM | POA: Diagnosis not present

## 2023-05-14 DIAGNOSIS — E11311 Type 2 diabetes mellitus with unspecified diabetic retinopathy with macular edema: Secondary | ICD-10-CM | POA: Diagnosis not present

## 2023-05-14 DIAGNOSIS — I428 Other cardiomyopathies: Secondary | ICD-10-CM | POA: Diagnosis not present

## 2023-05-14 DIAGNOSIS — I779 Disorder of arteries and arterioles, unspecified: Secondary | ICD-10-CM | POA: Insufficient documentation

## 2023-05-14 DIAGNOSIS — E785 Hyperlipidemia, unspecified: Secondary | ICD-10-CM | POA: Insufficient documentation

## 2023-05-15 ENCOUNTER — Encounter: Payer: Self-pay | Admitting: Physician Assistant

## 2023-05-15 ENCOUNTER — Other Ambulatory Visit: Payer: Self-pay | Admitting: Family Medicine

## 2023-05-15 DIAGNOSIS — C44519 Basal cell carcinoma of skin of other part of trunk: Secondary | ICD-10-CM | POA: Diagnosis not present

## 2023-05-15 DIAGNOSIS — L570 Actinic keratosis: Secondary | ICD-10-CM | POA: Diagnosis not present

## 2023-05-15 DIAGNOSIS — X32XXXD Exposure to sunlight, subsequent encounter: Secondary | ICD-10-CM | POA: Diagnosis not present

## 2023-05-15 DIAGNOSIS — C44319 Basal cell carcinoma of skin of other parts of face: Secondary | ICD-10-CM | POA: Diagnosis not present

## 2023-05-15 MED ORDER — FUROSEMIDE 40 MG PO TABS: 40.0000 mg | ORAL_TABLET | Freq: Every day | ORAL | 1 refills | Status: DC

## 2023-05-20 ENCOUNTER — Other Ambulatory Visit: Payer: Self-pay

## 2023-05-20 MED ORDER — RAMIPRIL 2.5 MG PO CAPS: ORAL_CAPSULE | ORAL | 3 refills | Status: DC

## 2023-05-28 DIAGNOSIS — H903 Sensorineural hearing loss, bilateral: Secondary | ICD-10-CM | POA: Diagnosis not present

## 2023-05-29 ENCOUNTER — Encounter: Payer: Self-pay | Admitting: Physician Assistant

## 2023-05-29 ENCOUNTER — Ambulatory Visit: Payer: PPO | Attending: Physician Assistant | Admitting: Physician Assistant

## 2023-05-29 VITALS — BP 122/54 | HR 64 | Ht 73.0 in | Wt 224.0 lb

## 2023-05-29 DIAGNOSIS — E785 Hyperlipidemia, unspecified: Secondary | ICD-10-CM

## 2023-05-29 DIAGNOSIS — Z01810 Encounter for preprocedural cardiovascular examination: Secondary | ICD-10-CM | POA: Diagnosis not present

## 2023-05-29 DIAGNOSIS — R9431 Abnormal electrocardiogram [ECG] [EKG]: Secondary | ICD-10-CM | POA: Diagnosis not present

## 2023-05-29 DIAGNOSIS — I779 Disorder of arteries and arterioles, unspecified: Secondary | ICD-10-CM

## 2023-05-29 DIAGNOSIS — E11311 Type 2 diabetes mellitus with unspecified diabetic retinopathy with macular edema: Secondary | ICD-10-CM | POA: Diagnosis not present

## 2023-05-29 DIAGNOSIS — I7121 Aneurysm of the ascending aorta, without rupture: Secondary | ICD-10-CM | POA: Diagnosis not present

## 2023-05-29 DIAGNOSIS — I712 Thoracic aortic aneurysm, without rupture, unspecified: Secondary | ICD-10-CM

## 2023-05-29 DIAGNOSIS — I5022 Chronic systolic (congestive) heart failure: Secondary | ICD-10-CM

## 2023-05-29 DIAGNOSIS — I428 Other cardiomyopathies: Secondary | ICD-10-CM

## 2023-05-29 MED ORDER — SACUBITRIL-VALSARTAN 24-26 MG PO TABS
1.0000 | ORAL_TABLET | Freq: Two times a day (BID) | ORAL | 2 refills | Status: DC
Start: 2023-05-29 — End: 2023-09-03

## 2023-05-29 NOTE — Patient Instructions (Addendum)
 Medication Instructions:   STOP TAKING AND REMOVE THIS MEDICATION FROM YOUR MEDICATION LIST:  RAMIPRIL   WAIT 36 HOURS AFTER STOPPING RAMIPRIL START TAKING:  ENTRESTO 24-26 TWICE A DAY   *If you need a refill on your cardiac medications before your next appointment, please call your pharmacy*   Lab Work:  PLEASE GO DOWN STAIRS  [facility]  FIRST FLOOR  [address] ( GET OFF ELEVATORS MAKE A LEFT AND ANOTHER LEFT LAB ON RIGHT DOWN HALLWAY :  THYROID PANEL TSH T4 T3 TODAY     If you have labs (blood work) drawn today and your tests are completely normal, you will receive your results only by: MyChart Message (if you have MyChart) OR A paper copy in the mail If you have any lab test that is abnormal or we need to change your treatment, we will call you to review the results.   Testing/Procedures: IN  3 MONTHS Your physician has requested that you have an echocardiogram. Echocardiography is a painless test that uses sound waves to create images of your heart. It provides your doctor with information about the size and shape of your heart and how well your heart's chambers and valves are working. This procedure takes approximately one hour. There are no restrictions for this procedure. Please do NOT wear cologne, perfume, aftershave, or lotions (deodorant is allowed). Please arrive 15 minutes prior to your appointment time.  Please note: We ask at that you not bring children with you during ultrasound (echo/ vascular) testing. Due to room size and safety concerns, children are not allowed in the ultrasound rooms during exams. Our front office staff cannot provide observation of children in our lobby area while testing is being conducted. An adult accompanying a patient to their appointment will only be allowed in the ultrasound room at the discretion of the ultrasound technician under special circumstances. We apologize for any inconvenience.   Follow-Up: At [facility], you and your  health needs are our priority.  As part of our continuing mission to provide you with exceptional heart care, we have created designated Provider Care Teams.  These Care Teams include your primary Cardiologist (physician) and Advanced Practice Providers (APPs -  Physician Assistants and Nurse Practitioners) who all work together to provide you with the care you need, when you need it.  We recommend signing up for the patient portal called "MyChart".  Sign up information is provided on this After Visit Summary.  MyChart is used to connect with patients for Virtual Visits (Telemedicine).  Patients are able to view lab/test results, encounter notes, upcoming appointments, etc.  Non-urgent messages can be sent to your provider as well.   To learn more about what you can do with MyChart, go to [email or web address].     Your next appointment:  YOU HAVE BEEN REFERRED TO CARDIAC REHAB ( SOMEONE WILL CONTACT YOU FROM THAT DEPARTMENT)  AFTER ECHOCARDIOGRAM   3 month(s)  WITH Provider:  Verne Carrow, MD  or Jari Favre, PA-C       Other Instructions    1st Floor: - Lobby - Registration  - Pharmacy  - Lab - Cafe  2nd Floor: - PV Lab - Diagnostic Testing (echo, CT, nuclear med)  3rd Floor: - Vacant  4th Floor: - TCTS (cardiothoracic surgery) - AFib Clinic - Structural Heart Clinic - Vascular Surgery  - Vascular Ultrasound  5th Floor: - HeartCare Cardiology (general and EP) - Clinical Pharmacy for coumadin, hypertension, lipid, weight-loss medications, and med  management appointments    Valet parking services will be available as well.

## 2023-05-29 NOTE — Progress Notes (Signed)
 Office Visit    Patient Name: Dominic Kane Date of Encounter: 05/29/2023  PCP:  Excell Seltzer, MD   [facility]  Cardiologist:  Verne Carrow, MD  Advanced Practice Provider:  No care team member to display Electrophysiologist:  None   HPI    Dominic Kane is a 83 y.o. male with a past medical history significant for diastolic and systolic CHF, hypertension, nonobstructive coronary artery disease, thoracic aortic aneurysm, nonischemic cardiomyopathy, benign prostatic hypertrophy and hyperlipidemia presents today for follow-up appointment.  Prior to 2005, he was known to have an LVEF around 35%.  Had an episode of chest pain in 2005 leading to cardiac catheterization May 2005 which showed mild nonobstructive coronary artery disease.  EF was noted to be 65%.  An echocardiogram during that admission in May 2005 in [place] showed an ejection fraction of 55 to 60% with the ascending aorta at the sinotubular junction measuring 4.1 cm.  LVEF had been noted to be around 45% on studies in 2010, 2011.  Echo September 2013 showed LVEF around 30%.  Cardiac MRI December 2014 showed mild enlargement of aortic root (4.2 cm) and EF of 44%.  Last stress test 9/13 without ischemia.  Echo May 2021 with LVEF 30 to 35% with global hypokinesis.  Mild AI.  Mild dilatation of the ascending aorta.  Nuclear stress test May 2021 with no ischemia.  Chest CTA May 2022 with stable ascending aorta 4.3 cm.  Last seen 04/04/2021 for follow-up.  Asymptomatic at that time.  I saw him February 2024, he states that he had some of his first bouts of shortness of breath and chest pain back in October or November.  He backed off what he was doing and it went away.  He does lots of lifting overhead and was working at the time.  He still having some fatigue but had a GI bleed back in December so attributes it to that.  He still can do what ever he wants to do but needs to take a rest after 2 to  3 hours of work.  Particularly yard work.  We discussed repeating an echocardiogram since he has not had one in a few years and we will need to update his CTA in May for his annual surveillance of his ascending aortic aneurysm.  I saw the patient 04/27/2023, he presents with a history of heart disease and type 2 diabetes, presents with increased shortness of breath and chest pressure. The symptoms, which started several months ago, occur unexpectedly and are particularly noticeable with prolonged standing and activity. The patient describes the chest pressure as a sensation in the upper chest, not making it hard to breathe but noticeable. The shortness of breath tends to build with activity but then levels off, allowing the patient to continue the activity. The patient also reports a decrease in energy levels and occasional lightheadedness. The patient's diabetes is currently managed with diet and exercise, but his recent A1c was 7, slightly higher than the previous year's 6. The patient also has an enlarged aorta, which is being monitored with annual CT scans. The patient's blood pressure has been low recently, and he has been experiencing some lightheadedness and decreased energy levels.  No edema, orthopnea, PND. Reports no palpitations.   Discussed the use of AI scribe software for clinical note transcription with the patient, who gave verbal consent to proceed.  Today, he presents with a history of heart disease, with ongoing fatigue and  shortness of breath. The patient's spouse reports that the shortness of breath has become more noticeable over the past year. The patient is currently on cholesterol medication, Crestor 10mg  daily, and his LDL was slightly above the target range at his last lab work. The patient also reports weight gain over the past year, attributing it to a combination of less activity and changes in diet. The patient's heart pump function is not normal, operating at about half  capacity, and the patient is currently on ramipril for this. The patient also has a history of thyroid issues, with his thyroid having been removed in 2018. The patient's thyroid function was last checked nine months ago and was within the normal range.  Reports no shortness of breath nor dyspnea on exertion. Reports no chest pain, pressure, or tightness. No edema, orthopnea, PND. Reports no palpitations.   Discussed the use of AI scribe software for clinical note transcription with the patient, who gave verbal consent to proceed.  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 15 yrs ago x1   Hypertrophy of prostate without urinary obstruction and other lower urinary tract symptoms (LUTS)    Lipoprotein deficiencies    Other and unspecified hyperlipidemia    Other primary cardiomyopathies    Pain in joint, lower leg    Personal history of colonic polyps    Personal history of other diseases of digestive system    Pes anserinus tendinitis or bursitis    left shoulder remains an issue   Sinoatrial node dysfunction (HCC)    Dr. Clifton James follows   Thoracic  aneurysm without mention of rupture    Thoracic or lumbosacral neuritis or radiculitis, unspecified    Unspecified disorder of skin and subcutaneous tissue    Unspecified essential hypertension    Unspecified vitamin D deficiency    Past Surgical History:  Procedure Laterality Date   BASAL CELL CARCINOMA EXCISION  11/2016   Dr. Margo Aye   CATARACT EXTRACTION, BILATERAL     COLONOSCOPY W/ POLYPECTOMY     COLONOSCOPY WITH PROPOFOL N/A 03/03/2022   Procedure: COLONOSCOPY WITH PROPOFOL;  Surgeon: Benancio Deeds, MD;  Location: [facility] ENDOSCOPY;  Service: Gastroenterology;  Laterality: N/A;   COLONOSCOPY WITH PROPOFOL N/A 03/04/2022   Procedure: COLONOSCOPY WITH PROPOFOL;  Surgeon: Benancio Deeds, MD;  Location: [facility] ENDOSCOPY;  Service: Gastroenterology;  Laterality: N/A;   COLONOSCOPY WITH PROPOFOL N/A 03/08/2022   Procedure: COLONOSCOPY WITH PROPOFOL;  Surgeon: Benancio Deeds, MD;  Location: [facility] ENDOSCOPY;  Service: Gastroenterology;  Laterality: N/A;  CORONARY PRESSURE/FFR STUDY N/A 05/01/2023   Procedure: CORONARY PRESSURE/FFR STUDY;  Surgeon: Orbie Pyo, MD;  Location: [facility] INVASIVE CV LAB;  Service: Cardiovascular;  Laterality: N/A;   DOBUTAMINE STRESS ECHO  1/07   Lateral hypokinesis but no ischemia   ESOPHAGOGASTRODUODENOSCOPY  11/07   Schatzki's ring, non bleeding erosive gastropathy [place]   ESOPHAGOGASTRODUODENOSCOPY (EGD) WITH PROPOFOL N/A 03/02/2022   Procedure: ESOPHAGOGASTRODUODENOSCOPY (EGD) WITH PROPOFOL;  Surgeon: Napoleon Form, MD;  Location: [facility] ENDOSCOPY;  Service: Gastroenterology;  Laterality: N/A;   FLEXIBLE SIGMOIDOSCOPY N/A 03/07/2022   Procedure: FLEXIBLE SIGMOIDOSCOPY;  Surgeon: Benancio Deeds, MD;  Location: [facility] ENDOSCOPY;  Service: Gastroenterology;  Laterality: N/A;   GIVENS CAPSULE STUDY N/A 03/05/2022   Procedure: GIVENS CAPSULE STUDY;  Surgeon: Benancio Deeds, MD;  Location: [facility] ENDOSCOPY;  Service: Gastroenterology;   Laterality: N/A;   KNEE SURGERY Left 1947   LYMPH NODE DISSECTION N/A 08/05/2016   Procedure: LIMITED LYMPH NODE DISSECTION;  Surgeon: Darnell Level, MD;  Location: [facility] ORS;  Service: General;  Laterality: N/A;   Pulmonary functioning tests  1. 2003  2. 2005   1. Diminished lung capacity  2. Stable   RIGHT HEART CATH AND CORONARY ANGIOGRAPHY N/A 05/01/2023   Procedure: RIGHT HEART CATH AND CORONARY ANGIOGRAPHY;  Surgeon: Orbie Pyo, MD;  Location: [facility] INVASIVE CV LAB;  Service: Cardiovascular;  Laterality: N/A;   THYROIDECTOMY N/A 08/05/2016   Procedure: TOTAL THYROIDECTOMY;  Surgeon: Darnell Level, MD;  Location: [facility] ORS;  Service: General;  Laterality: N/A;   TONSILLECTOMY     TOTAL KNEE ARTHROPLASTY Left 03/18/2016   Procedure: LEFT TOTAL KNEE ARTHROPLASTY;  Surgeon: Durene Romans, MD;  Location: [facility] ORS;  Service: Orthopedics;  Laterality: Left;    Allergies  Allergies  Allergen Reactions   Sulfonamide Derivatives Hives and Nausea And Vomiting   Codeine Nausea Only     EKGs/Labs/Other Studies Reviewed:   The following studies were reviewed today:  Cardiac catheterization 05/01/2023 Diagnostic Dominance: Right Left Main  Mid LM to Dist LM lesion is 20% stenosed.    Left Anterior Descending  There is mild diffuse disease throughout the vessel.    Left Circumflex  There is mild diffuse disease throughout the vessel.    Right Coronary Artery  There is mild diffuse disease throughout the vessel.    Intervention   No interventions have been documented.   Coronary Diagrams  Diagnostic Dominance: Right  Intervention  CT Angio 08/02/21  IMPRESSION: Grossly stable 4.1 cm ascending thoracic aortic aneurysm. Recommend annual imaging followup by CTA or MRA. This recommendation follows 2010 ACCF/AHA/AATS/ACR/ASA/SCA/SCAI/SIR/STS/SVM Guidelines for the Diagnosis and Management of Patients with Thoracic Aortic Disease. Circulation. 2010; 121: Z610-R604. Aortic aneurysm  NOS (ICD10-I71.9).   Echo 08/10/19:   1. Left ventricular ejection fraction, by estimation, is 30 to 35%. The  left ventricle has moderately decreased function. The left ventricle has  no regional wall motion abnormalities. There is mild concentric left  ventricular hypertrophy. Left  ventricular diastolic parameters are consistent with Grade I diastolic  dysfunction (impaired relaxation). Elevated left ventricular end-diastolic  pressure.   2. Right ventricular systolic function is normal. The right ventricular  size is normal. There is normal pulmonary artery systolic pressure.   3. Left atrial size was moderately dilated.   4. The mitral valve is normal in structure. Trivial mitral valve  regurgitation. No evidence of mitral stenosis.   5. The aortic valve is tricuspid. Aortic valve regurgitation is mild. No  aortic stenosis  is present.   6. Aortic dilatation noted. 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   7. The inferior vena cava is normal in size with greater than 50%  respiratory variability, suggesting right atrial pressure of 3 mmHg.    EKG:  EKG is  ordered today. The ekg ordered today demonstrates: sinus, RBBB  EKG:  EKG is not ordered today.    Recent Labs: 08/14/2022: TSH 3.09 04/14/2023: ALT 12; BUN 25; Creatinine, Ser 1.05 04/28/2023: Platelets 165 05/01/2023: Hemoglobin 12.6; Potassium 4.2; Sodium 140  Recent Lipid Panel    Component Value Date/Time   CHOL 128 04/14/2023 0803   TRIG 84.0 04/14/2023 0803   HDL 42.30 04/14/2023 0803   CHOLHDL 3 04/14/2023 0803   VLDL 16.8 04/14/2023 0803   LDLCALC 69 04/14/2023 0803     Home Medications   No outpatient medications have been marked as taking for the 05/29/23 encounter (Appointment) with Sharlene Dory, PA-C.     Review of Systems      All other systems reviewed and are otherwise negative except as noted above.  Physical Exam    VS:  There were no vitals taken for this visit. ,  BMI There is no height or weight on file to calculate BMI.  Wt Readings from Last 3 Encounters:  05/01/23 215 lb (97.5 kg)  04/29/23 219 lb (99.3 kg)  04/27/23 219 lb 9.6 oz (99.6 kg)     GEN: Well nourished, well developed, in no acute distress. HEENT: normal. Neck: Supple, no JVD, carotid bruits, or masses. Cardiac: RRR, no murmurs, rubs, or gallops. No clubbing, cyanosis, edema.  Radials/PT 2+ and equal bilaterally.  Respiratory:  Respirations regular and unlabored, clear to auscultation bilaterally. GI: Soft, nontender, nondistended. MS: No deformity or atrophy. Skin: Warm and dry, no rash. Neuro:  Strength and sensation are intact. Psych: Normal affect.  Assessment & Plan    Coronary Artery Disease Cardiac catheterization showed mild calcification in the right coronary artery, circumflex artery, and left anterior descending artery with a small 20% blockage in the left main artery. LDL slightly above target on Crestor 10mg  daily. -Increase Crestor to 20mg  daily to achieve LDL target of less than 55.  Heart Failure with Reduced Ejection Fraction Ejection fraction 35-40%, stable from previous year. Currently on Ramipril. Discussed potential benefits of switching to Entresto to improve heart function, balanced against potential for hypotension. -Stop Ramipril, washout for 72 hours, then start Entresto at lowest dose. -Monitor blood pressure closely after starting Entresto. -Repeat echocardiogram in 3 months to assess response to [facility].  Shortness of Breath Increasing over the past year, cause unclear. Possible contributions from pulmonary disease and reduced heart function. Patient scheduled to see pulmonology in the first week of April. -Continue with pulmonology appointment as planned.  Weight Gain Patient reports gaining 15-18 pounds over the past year due to decreased activity and dietary changes. -Encourage increased physical activity and healthy diet. -Refer to cardiac  rehabilitation program for exercise guidance and cooking classes.  Thyroid Function Patient has history of thyroidectomy, last thyroid function test 9 months ago was normal. -Order thyroid function panel and send results to primary care provider.  Follow-up in 1 month after echocardiogram to review results and adjust treatment plan as necessary.  Signed, Sharlene Dory, PA-C 05/29/2023, 1:10 PM [facility]

## 2023-05-30 LAB — THYROID PANEL WITH TSH
Free Thyroxine Index: 1.7 (ref 1.2–4.9)
T3 Uptake Ratio: 27 % (ref 24–39)
T4, Total: 6.2 ug/dL (ref 4.5–12.0)
TSH: 6.44 u[IU]/mL — ABNORMAL HIGH (ref 0.450–4.500)

## 2023-05-30 LAB — T3, FREE: T3, Free: 2 pg/mL (ref 2.0–4.4)

## 2023-05-30 LAB — T4, FREE: Free T4: 1 ng/dL (ref 0.82–1.77)

## 2023-06-01 ENCOUNTER — Other Ambulatory Visit: Payer: Self-pay | Admitting: Physician Assistant

## 2023-06-01 ENCOUNTER — Encounter: Payer: Self-pay | Admitting: Physician Assistant

## 2023-06-01 ENCOUNTER — Telehealth (HOSPITAL_COMMUNITY): Payer: Self-pay | Admitting: *Deleted

## 2023-06-01 NOTE — Telephone Encounter (Signed)
 Received referral from Jari Favre PA with Dr. Clifton James for Cardiac rehab.  Reviewed medical history, pt does not have a qualifying diagnosis for medicare reimbursement for Cardiac rehab phase II. EF is too too high(must be 35% or below) his is 35-40% and pt has not had any recent cardiac events such as MI,CABG,Valve surgery, PCI,stable angina.  Pt could however qualify for pulmonary rehab 2 x week access to dietician but would not feature cooking demonstrations. Called and left message to discuss other options. Would need referral placed for ambulatory referral for pulmonary rehab with the diagnosis of chronic diastolic and systolic heart failure signed by MD ( can not be signed by APP for insurance to reimburse). This referral can be placed by any MD provider on his care team ( does not have to be placed by pulmonary MD). [name], BSN Cardiac and Emergency planning/management officer

## 2023-06-01 NOTE — Telephone Encounter (Signed)
 Dominic Kane returned call.  Reviewed options for structural exercise. Very much interested in the pulmonary rehab program here at [facility] (lives in [place]). Ideally prefers 2 days a week. Has appt with pulmonary but not until April. Advised pt that I did CC Dr. Clifton James on the response back with the suggestion for pulmonary rehab that can be placed now as his pulmonary follow up is actually 4/1 not next week ( pt eye appt is next week). [name], BSN Cardiac and Emergency planning/management officer

## 2023-06-02 ENCOUNTER — Other Ambulatory Visit (HOSPITAL_COMMUNITY): Payer: Self-pay | Admitting: *Deleted

## 2023-06-02 ENCOUNTER — Ambulatory Visit: Admitting: Family Medicine

## 2023-06-02 ENCOUNTER — Ambulatory Visit (INDEPENDENT_AMBULATORY_CARE_PROVIDER_SITE_OTHER): Admitting: Family Medicine

## 2023-06-02 VITALS — BP 94/39 | HR 60 | Temp 98.1°F | Ht 69.75 in | Wt 222.1 lb

## 2023-06-02 DIAGNOSIS — I5042 Chronic combined systolic (congestive) and diastolic (congestive) heart failure: Secondary | ICD-10-CM

## 2023-06-02 DIAGNOSIS — I952 Hypotension due to drugs: Secondary | ICD-10-CM

## 2023-06-02 DIAGNOSIS — E890 Postprocedural hypothyroidism: Secondary | ICD-10-CM | POA: Diagnosis not present

## 2023-06-02 MED ORDER — LEVOTHYROXINE SODIUM 175 MCG PO TABS: 175.0000 ug | ORAL_TABLET | Freq: Every day | ORAL | 11 refills | Status: AC

## 2023-06-02 NOTE — Progress Notes (Signed)
 Patient ID: Dominic Kane, male    DOB: 08-05-1940, 83 y.o.   MRN: [id number]  This visit was conducted in person.  BP (!) 94/39 (BP Location: Left Arm, Patient Position: Sitting, Cuff Size: Normal)   Pulse 60   Temp 98.1 F (36.7 C) (Temporal)   Ht 5' 9.75" (1.772 m)   Wt 222 lb 2 oz (100.8 kg)   SpO2 94%   BMI 32.10 kg/m    CC:  Chief Complaint  Patient presents with   Abnormal Labs    Thyroid Labs from Cardiologist     Subjective:   HPI: Dominic Kane is a 83 y.o. male presenting on 06/02/2023 for Abnormal Labs (Thyroid Labs from Cardiologist/)   Recent  labs done at cardiologist.. show high TSH and  nml free 3 and free t4  He has history of hypothyroid and is on levothyroxine 150 mcg daily. He has been avoiding foods and meds that effect absorption such as Biotin and is taking it alone.  Reviewed OV note from 05/29/23 Dominic Favre, PA  Pt seen for CHF with increase SOB.  He has been more fatigue in last several  months.   Has been less active and gained weight 15-18 lbs over last year  ECHO  stable from 2024 per note  Changed from rampipril to entresto after 72 hour wash out  Last night was first dose.  BP this AM very low, feels presyncopal  when went to  walk the dog 76-80/47-56  HR 101-105 Feeing lightheadedness with exertion. Hx of thyroidectomy for papillary thyroid carcinoma.  BP Readings from Last 3 Encounters:  06/02/23 (!) 94/39  05/29/23 (!) 122/54  05/01/23 138/78     Relevant past medical, surgical, family and social history reviewed and updated as indicated. Interim medical history since our last visit reviewed. Allergies and medications reviewed and updated. Outpatient Medications Prior to Visit  Medication Sig Dispense Refill   Accu-Chek Softclix Lancets lancets Use to check blood sugar once daily 100 each 3   acetaminophen (TYLENOL) 500 MG tablet Take 1,000 mg by mouth every 8 (eight) hours as needed for moderate pain (pain score 4-6).      aspirin EC 81 MG tablet Take 1 tablet (81 mg total) by mouth daily. Swallow whole. 30 tablet 12   Chlorpheniramine-DM (CORICIDIN HBP COUGH/COLD PO) Take 1 tablet by mouth daily as needed (congestion).     Cholecalciferol (VITAMIN D-3) 125 MCG (5000 UT) TABS Take 5,000 Units by mouth at bedtime.     ciprofloxacin (CILOXAN) 0.3 % ophthalmic solution Place 2 drops into both eyes as needed (For Eye Injections).     ferrous sulfate 325 (65 FE) MG EC tablet 1 tablet with food every other day 100 tablet 3   finasteride (PROSCAR) 5 MG tablet Take 1 tablet (5 mg total) by mouth daily. 90 tablet 3   fluticasone (FLONASE) 50 MCG/ACT nasal spray Place 1 spray into both nostrils daily as needed for allergies or rhinitis.     furosemide (LASIX) 40 MG tablet Take 1 tablet (40 mg total) by mouth daily. 90 tablet 1   guaiFENesin (MUCINEX) 600 MG 12 hr tablet Take 600 mg by mouth 2 (two) times daily as needed for cough.     Lancets (ONETOUCH DELICA PLUS LANCET33G) MISC      levothyroxine (SYNTHROID) 150 MCG tablet TAKE 1 TABLET BY MOUTH EVERY DAY BEFORE BREAKFAST 90 tablet 3   Melatonin 5 MG CAPS Take 5 mg by mouth  at bedtime.     Multiple Vitamins-Minerals (PRESERVISION AREDS 2 PO) Take 1 tablet by mouth in the morning and at bedtime.     nitroGLYCERIN (NITROSTAT) 0.4 MG SL tablet Place 1 tablet (0.4 mg total) under the tongue every 5 (five) minutes as needed for chest pain. 25 tablet 3   omeprazole (PRILOSEC) 20 MG capsule Take 1 capsule (20 mg total) by mouth daily. 90 capsule 3   Phenylephrine-DM-GG-APAP (TYLENOL COLD/FLU SEVERE PO) Take 2 tablets by mouth daily as needed (cold symptoms/congestion). For High Blood Pressure     polyethylene glycol (MIRALAX / GLYCOLAX) 17 g packet Take 17 g by mouth daily.     PRESCRIPTION MEDICATION every 6 (six) months. *Antibiotic Injection in Eye*     rosuvastatin (CRESTOR) 10 MG tablet Take 1 tablet (10 mg total) by mouth daily. 90 tablet 3   sacubitril-valsartan  (ENTRESTO) 24-26 MG Take 1 tablet by mouth 2 (two) times daily. 180 tablet 2   terazosin (HYTRIN) 10 MG capsule Take 1 capsule (10 mg total) by mouth at bedtime. 90 capsule 3   ramipril (ALTACE) 2.5 MG capsule TAKE 1 CAPSULE BY MOUTH EVERY DAY 90 capsule 3   No facility-administered medications prior to visit.     Per HPI unless specifically indicated in ROS section below Review of Systems  Constitutional:  Negative for fatigue and fever.  HENT:  Negative for ear pain.   Eyes:  Negative for pain.  Respiratory:  Negative for cough and shortness of breath.   Cardiovascular:  Negative for chest pain, palpitations and leg swelling.  Gastrointestinal:  Negative for abdominal pain.  Genitourinary:  Negative for dysuria.  Musculoskeletal:  Negative for arthralgias.  Neurological:  Negative for syncope, light-headedness and headaches.  Psychiatric/Behavioral:  Negative for dysphoric mood.    Objective:  BP (!) 94/39 (BP Location: Left Arm, Patient Position: Sitting, Cuff Size: Normal)   Pulse 60   Temp 98.1 F (36.7 C) (Temporal)   Ht 5' 9.75" (1.772 m)   Wt 222 lb 2 oz (100.8 kg)   SpO2 94%   BMI 32.10 kg/m   Wt Readings from Last 3 Encounters:  06/02/23 222 lb 2 oz (100.8 kg)  05/29/23 224 lb (101.6 kg)  05/01/23 215 lb (97.5 kg)      Physical Exam Vitals reviewed.  Constitutional:      Appearance: He is well-developed.  HENT:     Head: Normocephalic.     Right Ear: Hearing normal.     Left Ear: Hearing normal.     Nose: Nose normal.  Neck:     Thyroid: No thyroid mass or thyromegaly.     Vascular: No carotid bruit.     Trachea: Trachea normal.  Cardiovascular:     Rate and Rhythm: Normal rate and regular rhythm.     Pulses: Normal pulses.     Heart sounds: Heart sounds not distant. No murmur heard.    No friction rub. No gallop.  Pulmonary:     Effort: Pulmonary effort is normal. No respiratory distress.     Breath sounds: Normal breath sounds.  Musculoskeletal:      Right lower leg: No edema.     Left lower leg: No edema.  Skin:    General: Skin is warm and dry.     Findings: No rash.  Psychiatric:        Speech: Speech normal.        Behavior: Behavior normal.  Thought Content: Thought content normal.       Results for orders placed or performed in visit on 05/29/23  Thyroid Panel With TSH   Collection Time: 05/29/23  3:41 PM  Result Value Ref Range   TSH 6.440 (H) 0.450 - 4.500 uIU/mL   T4, Total 6.2 4.5 - 12.0 ug/dL   T3 Uptake Ratio 27 24 - 39 %   Free Thyroxine Index 1.7 1.2 - 4.9  T4, free   Collection Time: 05/29/23  3:41 PM  Result Value Ref Range   Free T4 1.00 0.82 - 1.77 ng/dL  T3, free   Collection Time: 05/29/23  3:41 PM  Result Value Ref Range   T3, Free 2.0 2.0 - 4.4 pg/mL    Assessment and Plan  There are no diagnoses linked to this encounter.  No follow-ups on file.   Kerby Nora, MD

## 2023-06-02 NOTE — Assessment & Plan Note (Signed)
 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 No longer seeing endocrinology. TSH elevated, free T3 and free T4 within normal range. Discussed with patient given that he is symptomatic with fatigue, weight gain we will move ahead with increase in levothyroxine dose.  He will return in 4 to 6 weeks for repeat thyroid evaluation.

## 2023-06-02 NOTE — Assessment & Plan Note (Signed)
 Acute in setting of new start Entresto. Presyncope with exertion this morning after taking first dose of Entresto.  Blood pressure down to 76/47 with heart rate 101-105. Feeling better now with rest.  Encouraged patient to contact cardiology given new start Entresto.  Likely will recommend holding Entresto and returning to ramipril. Rest today, keep up with fluids and avoid exertion until blood pressure back in normal range.  Will CC Jari Favre, [place] [facility]

## 2023-06-02 NOTE — Patient Instructions (Addendum)
 Increase levothyroxine to 175 mcg daily  Contact cardiology about low BP and presyncope with Entresto start. Likely stop entresto and return to ramipril tommorow, rest today, drink liquids (do not overdo given heart failure) and hold off on exertion today.

## 2023-06-04 ENCOUNTER — Telehealth: Payer: Self-pay

## 2023-06-04 NOTE — Telephone Encounter (Signed)
 Per Cc'd chart: Triage, can we have someone give this patient a phone call and ask him to hold his Sherryll Burger for a few days.  Check his blood pressure daily and report back to me with those values.  We will decide from there if it is safe to restart him back on it.  Thanks! Sharlene Dory, PA-C  Spoke with Pt. Pt has already stopped his entresto. Pt will check blood pressures daily for the next few days and report those readings next week.

## 2023-06-05 ENCOUNTER — Telehealth (HOSPITAL_COMMUNITY): Payer: Self-pay | Admitting: *Deleted

## 2023-06-05 NOTE — Telephone Encounter (Signed)
 Referred to Pulmonary rehab by Dr. Clifton James with the diagnosis of chronic heart failure. Clinical review of pt follow up appt on 05/29/23 with Jari Favre, PA - cardiology office note. Pt appropriate for scheduling for on site Pulmonary Rehab.   Called pt and he is interested. Pt scheduled for orientation 06/09/23 at 10:30. He will begin exercise 06/16/23 at 1:15. Will send "welcome letter" with directions by MyChart.   Will forward to Estate manager/land agent  for insurance verification.  Ethelda Chick BS, ACSM-CEP 06/05/2023 2:24 PM

## 2023-06-08 ENCOUNTER — Encounter (INDEPENDENT_AMBULATORY_CARE_PROVIDER_SITE_OTHER): Payer: PPO | Admitting: Ophthalmology

## 2023-06-08 DIAGNOSIS — H33303 Unspecified retinal break, bilateral: Secondary | ICD-10-CM

## 2023-06-08 DIAGNOSIS — H35033 Hypertensive retinopathy, bilateral: Secondary | ICD-10-CM | POA: Diagnosis not present

## 2023-06-08 DIAGNOSIS — H34812 Central retinal vein occlusion, left eye, with macular edema: Secondary | ICD-10-CM | POA: Diagnosis not present

## 2023-06-08 DIAGNOSIS — H43813 Vitreous degeneration, bilateral: Secondary | ICD-10-CM | POA: Diagnosis not present

## 2023-06-08 DIAGNOSIS — I10 Essential (primary) hypertension: Secondary | ICD-10-CM | POA: Diagnosis not present

## 2023-06-08 DIAGNOSIS — H348112 Central retinal vein occlusion, right eye, stable: Secondary | ICD-10-CM | POA: Diagnosis not present

## 2023-06-09 ENCOUNTER — Telehealth (HOSPITAL_COMMUNITY): Payer: Self-pay

## 2023-06-09 NOTE — Telephone Encounter (Signed)
 Called pt to confirm his Pulmonary rehab orientation on 06/09/23. No answer. LVM

## 2023-06-10 ENCOUNTER — Encounter (HOSPITAL_COMMUNITY): Payer: Self-pay

## 2023-06-10 ENCOUNTER — Telehealth (HOSPITAL_COMMUNITY): Payer: Self-pay

## 2023-06-10 ENCOUNTER — Encounter (HOSPITAL_COMMUNITY)
Admission: RE | Admit: 2023-06-10 | Discharge: 2023-06-10 | Disposition: A | Discharge status: Home / Self Care | Source: Ambulatory Visit | Attending: Cardiovascular Disease | Admitting: Cardiovascular Disease

## 2023-06-10 VITALS — BP 106/66 | HR 67 | Ht 72.0 in | Wt 222.0 lb

## 2023-06-10 DIAGNOSIS — I5042 Chronic combined systolic (congestive) and diastolic (congestive) heart failure: Secondary | ICD-10-CM | POA: Diagnosis not present

## 2023-06-10 NOTE — Progress Notes (Signed)
 Pulmonary Individual Treatment Plan  Patient Details  Name: Dominic Kane MRN: [id number] Date of Birth: 1941/02/14 Referring Provider:   Doristine Devoid Pulmonary Rehab Walk Test from 06/10/2023 in [facility]  Referring Provider Dr. Verne Carrow       Initial Encounter Date:  Flowsheet Row Pulmonary Rehab Walk Test from 06/10/2023 in [facility]  Date 06/10/23       Visit Diagnosis: Heart failure, systolic and diastolic, chronic (HCC)  Patient's Home Medications on Admission:   Current Outpatient Medications:    Accu-Chek Softclix Lancets lancets, Use to check blood sugar once daily, Disp: 100 each, Rfl: 3   acetaminophen (TYLENOL) 500 MG tablet, Take 1,000 mg by mouth every 8 (eight) hours as needed for moderate pain (pain score 4-6)., Disp: , Rfl:    aspirin EC 81 MG tablet, Take 1 tablet (81 mg total) by mouth daily. Swallow whole., Disp: 30 tablet, Rfl: 12   Chlorpheniramine-DM (CORICIDIN HBP COUGH/COLD PO), Take 1 tablet by mouth daily as needed (congestion)., Disp: , Rfl:    Cholecalciferol (VITAMIN D-3) 125 MCG (5000 UT) TABS, Take 5,000 Units by mouth at bedtime., Disp: , Rfl:    ciprofloxacin (CILOXAN) 0.3 % ophthalmic solution, Place 2 drops into both eyes as needed (For Eye Injections)., Disp: , Rfl:    ferrous sulfate 325 (65 FE) MG EC tablet, 1 tablet with food every other day, Disp: 100 tablet, Rfl: 3   finasteride (PROSCAR) 5 MG tablet, Take 1 tablet (5 mg total) by mouth daily., Disp: 90 tablet, Rfl: 3   fluticasone (FLONASE) 50 MCG/ACT nasal spray, Place 1 spray into both nostrils daily as needed for allergies or rhinitis., Disp: , Rfl:    furosemide (LASIX) 40 MG tablet, Take 1 tablet (40 mg total) by mouth daily., Disp: 90 tablet, Rfl: 1   guaiFENesin (MUCINEX) 600 MG 12 hr tablet, Take 600 mg by mouth 2 (two) times daily as needed for  cough., Disp: , Rfl:    Lancets (ONETOUCH DELICA PLUS LANCET33G) MISC, , Disp: , Rfl:    levothyroxine (SYNTHROID) 175 MCG tablet, Take 1 tablet (175 mcg total) by mouth daily., Disp: 30 tablet, Rfl: 11   Melatonin 5 MG CAPS, Take 5 mg by mouth at bedtime., Disp: , Rfl:    Multiple Vitamins-Minerals (PRESERVISION AREDS 2 PO), Take 1 tablet by mouth in the morning and at bedtime., Disp: , Rfl:    nitroGLYCERIN (NITROSTAT) 0.4 MG SL tablet, Place 1 tablet (0.4 mg total) under the tongue every 5 (five) minutes as needed for chest pain., Disp: 25 tablet, Rfl: 3   omeprazole (PRILOSEC) 20 MG capsule, Take 1 capsule (20 mg total) by mouth daily., Disp: 90 capsule, Rfl: 3   Phenylephrine-DM-GG-APAP (TYLENOL COLD/FLU SEVERE PO), Take 2 tablets by mouth daily as needed (cold symptoms/congestion). For High Blood Pressure, Disp: , Rfl:    polyethylene glycol (MIRALAX / GLYCOLAX) 17 g packet, Take 17 g by mouth daily., Disp: , Rfl:    PRESCRIPTION MEDICATION, every 6 (six) months. 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 15 yrs ago x1   Hypertrophy of prostate without urinary obstruction and other lower urinary tract symptoms (LUTS)    Lipoprotein deficiencies    Other and unspecified hyperlipidemia    Other primary cardiomyopathies    Pain in joint, lower leg    Personal history of colonic polyps    Personal history of other diseases of digestive system    Pes anserinus tendinitis or bursitis    left shoulder remains an issue   Sinoatrial node dysfunction (HCC)    Dr. 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    Thoracic or lumbosacral neuritis or radiculitis, unspecified    Unspecified disorder of skin and subcutaneous tissue    Unspecified essential hypertension    Unspecified vitamin D deficiency     Tobacco Use: Social History   Tobacco Use  Smoking Status Former   Current packs/day: 0.00   Types: Cigarettes   Quit date: 07/03/1980   Years since quitting: 42.9  Smokeless Tobacco Never    Labs: Review Flowsheet  More data exists      Latest Ref Rng & Units 07/22/2021 10/22/2021 08/14/2022 04/14/2023 05/01/2023  Labs for ITP Cardiac and Pulmonary Rehab  Cholestrol 0 - 200 mg/dL - 254  - 270  -  LDL (calc) 0 - 99 mg/dL - 72  - 69  -  HDL-C >62.37 mg/dL - 62.83  - 15.17  -  Trlycerides 0.0 - 149.0 mg/dL - 61.6  - 07.3  -  Hemoglobin A1c 4.6 - 6.5 % 6.1  6.8  6.8  7.1  -  PH, Arterial 7.35 - 7.45 - - - - 7.383   PCO2 arterial 32 - 48 mmHg - - - - 37.2   Bicarbonate 20.0 - 28.0 mmol/L - - - - 24.1  21.4  22.1   TCO2 22 - 32 mmol/L - - - - 25  23  23    Acid-base deficit 0.0 - 2.0 mmol/L - - - - 1.0  5.0  3.0   O2 Saturation % - - - - 65  68  95      Details       Multiple values from one day are sorted in reverse-chronological order         Capillary Blood Glucose: Lab Results  Component Value Date   GLUCAP 109 (H) 05/01/2023   GLUCAP 85 03/09/2022   GLUCAP 120 (H) 03/08/2022   GLUCAP 136 (H)  03/08/2022   GLUCAP 89 03/08/2022    POCT Glucose     Row Name 06/10/23 1041             POCT Blood Glucose   Pre-Exercise 127 mg/dL                Pulmonary Assessment Scores:  Pulmonary Assessment Scores     Row Name 06/10/23 1226         ADL UCSD   ADL Phase Entry     SOB Score total 44       CAT Score   CAT Score 12       mMRC Score   mMRC Score 2             UCSD: Self-administered rating of dyspnea associated with activities of daily living (ADLs) 6-point scale (0 = "not at all" to 5 = "maximal or unable to do because of breathlessness")  Scoring Scores range from 0 to 120.  Minimally important difference is 5 units  CAT: CAT can identify the health impairment of COPD patients and is better correlated with disease progression.  CAT has a scoring range of zero to 40. The CAT score is classified into four groups of low (less than 10), medium (10 - 20), high (21-30) and very high (31-40) based on the impact level of disease on health status. A CAT score over 10 suggests significant symptoms.  A worsening CAT score could be explained by an exacerbation, poor medication adherence, poor inhaler technique, or progression of COPD or comorbid conditions.  CAT MCID is 2 points  mMRC: mMRC (Modified Medical Research Council) Dyspnea Scale is used to assess the degree of baseline functional disability in patients of respiratory disease due to dyspnea. No minimal important difference is established. A decrease in score of 1 point or greater is considered a positive change.   Pulmonary Function Assessment:  Pulmonary Function Assessment - 06/10/23 1226       Breath   Bilateral Breath Sounds  Wheezes;Expiratory;Inspiratory    Shortness of Breath Yes;Limiting activity;Fear of Shortness of Breath             Exercise Target Goals: Exercise Program Goal: Individual exercise prescription set using results from initial 6 min walk test and THRR while considering  patient's activity barriers and safety.   Exercise Prescription Goal: Initial exercise prescription builds to 30-45 minutes a day of aerobic activity, 2-3 days per week.  Home exercise guidelines will be given to patient during program as part of exercise prescription that the participant will acknowledge.  Activity Barriers & Risk Stratification:  Activity Barriers & Cardiac Risk Stratification - 06/10/23 1037       Activity Barriers & Cardiac Risk Stratification   Activity Barriers Muscular Weakness;Shortness of Breath;Deconditioning             6 Minute Walk:  6 Minute Walk     Row Name 06/10/23 1214         6 Minute Walk   Phase Initial     Distance 1140 feet     Walk Time 6 minutes     # of Rest Breaks 0     MPH 2.16     METS 1.59     RPE 9     Perceived Dyspnea  0     VO2 Peak 5.57     Symptoms No     Resting HR 67 bpm     Resting BP 106/66  Resting Oxygen Saturation  97 %     Exercise Oxygen Saturation  during 6 min walk 89 %     Max Ex. HR 74 bpm     Max Ex. BP 126/60     2 Minute Post BP 112/62       Interval HR   1 Minute HR 74     2 Minute HR 74     3 Minute HR 70     4 Minute HR 71     5 Minute HR 73     6 Minute HR 73     2 Minute Post HR 63     Interval Heart Rate? Yes       Interval Oxygen   Interval Oxygen? Yes     Baseline Oxygen Saturation % 97 %     1 Minute Oxygen Saturation % 93 %     1 Minute Liters of Oxygen 0 L     2 Minute Oxygen Saturation % 92 %     2 Minute Liters of Oxygen 0 L     3 Minute Oxygen Saturation % 91 %     3 Minute Liters of Oxygen 0 L     4 Minute Oxygen Saturation % 89 %     4 Minute Liters of Oxygen 0 L     5 Minute Oxygen  Saturation % 91 %     5 Minute Liters of Oxygen 0 L     6 Minute Oxygen Saturation % 90 %     6 Minute Liters of Oxygen 0 L     2 Minute Post Oxygen Saturation % 98 %     2 Minute Post Liters of Oxygen 0 L              Oxygen Initial Assessment:  Oxygen Initial Assessment - 06/10/23 1051       Home Oxygen   Home Oxygen Device None    Sleep Oxygen Prescription None    Home Exercise Oxygen Prescription None    Home Resting Oxygen Prescription None      Initial 6 min Walk   Oxygen Used None      Program Oxygen Prescription   Program Oxygen Prescription None      Intervention   Short Term Goals To learn and understand importance of maintaining oxygen saturations>88%;To learn and understand importance of monitoring SPO2 with pulse oximeter and demonstrate accurate use of the pulse oximeter.;To learn and demonstrate proper pursed lip breathing techniques or other breathing techniques.     Long  Term Goals Exhibits proper breathing techniques, such as pursed lip breathing or other method taught during program session;Verbalizes importance of monitoring SPO2 with pulse oximeter and return demonstration;Maintenance of O2 saturations>88%             Oxygen Re-Evaluation:   Oxygen Discharge (Final Oxygen Re-Evaluation):   Initial Exercise Prescription:  Initial Exercise Prescription - 06/10/23 1300       Date of Initial Exercise RX and Referring Provider   Date 06/10/23    Referring Provider Dr. Verne Carrow    Expected Discharge Date 09/03/23      Recumbant Elliptical   Level 1    Minutes 15    METs 1.5      Track   Laps 5    Minutes 15    METs 1.77      Prescription Details   Frequency (times per week) 2    Duration Progress to 30  minutes of continuous aerobic without signs/symptoms of physical distress      Intensity   THRR 40-80% of Max Heartrate 55-110    Ratings of Perceived Exertion 11-13    Perceived Dyspnea 0-4      Progression    Progression Continue to progress workloads to maintain intensity without signs/symptoms of physical distress.      Resistance Training   Training Prescription Yes    Weight blue bands    Reps 10-15             Perform Capillary Blood Glucose checks as needed.  Exercise Prescription Changes:   Exercise Comments:   Exercise Goals and Review:   Exercise Goals     Row Name 06/10/23 1050             Exercise Goals   Increase Physical Activity Yes       Intervention Provide advice, education, support and counseling about physical activity/exercise needs.;Develop an individualized exercise prescription for aerobic and resistive training based on initial evaluation findings, risk stratification, comorbidities and participant's personal goals.       Expected Outcomes Short Term: Attend rehab on a regular basis to increase amount of physical activity.;Long Term: Exercising regularly at least 3-5 days a week.;Long Term: Add in home exercise to make exercise part of routine and to increase amount of physical activity.       Increase Strength and Stamina Yes       Intervention Provide advice, education, support and counseling about physical activity/exercise needs.;Develop an individualized exercise prescription for aerobic and resistive training based on initial evaluation findings, risk stratification, comorbidities and participant's personal goals.       Expected Outcomes Short Term: Increase workloads from initial exercise prescription for resistance, speed, and METs.;Short Term: Perform resistance training exercises routinely during rehab and add in resistance training at home;Long Term: Improve cardiorespiratory fitness, muscular endurance and strength as measured by increased METs and functional capacity ( )       Able to understand and use rate of perceived exertion (RPE) scale Yes       Intervention Provide education and explanation on how to use RPE scale       Expected  Outcomes Short Term: Able to use RPE daily in rehab to express subjective intensity level;Long Term:  Able to use RPE to guide intensity level when exercising independently       Able to understand and use Dyspnea scale Yes       Intervention Provide education and explanation on how to use Dyspnea scale       Expected Outcomes Short Term: Able to use Dyspnea scale daily in rehab to express subjective sense of shortness of breath during exertion;Long Term: Able to use Dyspnea scale to guide intensity level when exercising independently       Knowledge and understanding of Target Heart Rate Range (THRR) Yes       Intervention Provide education and explanation of THRR including how the numbers were predicted and where they are located for reference       Expected Outcomes Short Term: Able to state/look up THRR;Short Term: Able to use daily as guideline for intensity in rehab;Long Term: Able to use THRR to govern intensity when exercising independently       Understanding of Exercise Prescription Yes       Intervention Provide education, explanation, and written materials on patient's individual exercise prescription       Expected Outcomes Short Term: Able to explain  program exercise prescription;Long Term: Able to explain home exercise prescription to exercise independently                Exercise Goals Re-Evaluation :   Discharge Exercise Prescription (Final Exercise Prescription Changes):   Nutrition:  Target Goals: Understanding of nutrition guidelines, daily intake of sodium 1500mg , cholesterol 200mg , calories 30% from fat and 7% or less from saturated fats, daily to have 5 or more servings of fruits and vegetables.  Biometrics:  Pre Biometrics - 06/10/23 1225       Pre Biometrics   Grip Strength 30 kg              Nutrition Therapy Plan and Nutrition Goals:   Nutrition Assessments:  MEDIFICTS Score Key: >=70 Need to make dietary changes  40-70 Heart Healthy  Diet <= 40 Therapeutic Level Cholesterol Diet   Picture Your Plate Scores: <78 Unhealthy dietary pattern with much room for improvement. 41-50 Dietary pattern unlikely to meet recommendations for good health and room for improvement. 51-60 More healthful dietary pattern, with some room for improvement.  >60 Healthy dietary pattern, although there may be some specific behaviors that could be improved.    Nutrition Goals Re-Evaluation:   Nutrition Goals Discharge (Final Nutrition Goals Re-Evaluation):   Psychosocial: Target Goals: Acknowledge presence or absence of significant depression and/or stress, maximize coping skills, provide positive support system. Participant is able to verbalize types and ability to use techniques and skills needed for reducing stress and depression.  Initial Review & Psychosocial Screening:  Initial Psych Review & Screening - 06/10/23 1038       Initial Review   Current issues with Current Sleep Concerns   Pt has an upcoming appt for possible sleep apnea     Family Dynamics   Good Support System? Yes    Comments Good support from wife      Barriers   Psychosocial barriers to participate in program Psychosocial barriers identified (see note)      Screening Interventions   Interventions Encouraged to exercise;Provide feedback about the scores to participant;To provide support and resources with identified psychosocial needs    Expected Outcomes Short Term goal: Utilizing psychosocial counselor, staff and physician to assist with identification of specific Stressors or current issues interfering with healing process. Setting desired goal for each stressor or current issue identified.;Long Term Goal: Stressors or current issues are controlled or eliminated.;Short Term goal: Identification and review with participant of any Quality of Life or Depression concerns found by scoring the questionnaire.;Long Term goal: The participant improves quality of Life and  PHQ9 Scores as seen by post scores and/or verbalization of changes             Quality of Life Scores:  Scores of 19 and below usually indicate a poorer quality of life in these areas.  A difference of  2-3 points is a clinically meaningful difference.  A difference of 2-3 points in the total score of the Quality of Life Index has been associated with significant improvement in overall quality of life, self-image, physical symptoms, and general health in studies assessing change in quality of life.  PHQ-9: Review Flowsheet  More data exists      06/10/2023 04/29/2023 08/14/2022 04/28/2022 04/25/2021  Depression screen PHQ 2/9  Decreased Interest 0 0 0 0 0  Down, Depressed, Hopeless 0 0 0 0 0  PHQ - 2 Score 0 0 0 0 0  Altered sleeping 0 - - - -  Tired, decreased  energy 1 - - - -  Change in appetite 0 - - - -  Feeling bad or failure about yourself  0 - - - -  Trouble concentrating 0 - - - -  Moving slowly or fidgety/restless 0 - - - -  Suicidal thoughts 0 - - - -  PHQ-9 Score 1 - - - -  Difficult doing work/chores Somewhat difficult - - - -   Interpretation of Total Score  Total Score Depression Severity:  1-4 = Minimal depression, 5-9 = Mild depression, 10-14 = Moderate depression, 15-19 = Moderately severe depression, 20-27 = Severe depression   Psychosocial Evaluation and Intervention:  Psychosocial Evaluation - 06/10/23 1335       Psychosocial Evaluation & Interventions   Interventions Encouraged to exercise with the program and follow exercise prescription    Comments Deante endorses sleep concerns. Gets up in the middle of the night to use restroom. Is scheduled to see MD about possible sleep apnea.    Expected Outcomes For Jafar's quality of sleep to improve. To see his MD about possible sleep apnea    Continue Psychosocial Services  No Follow up required             Psychosocial Re-Evaluation:   Psychosocial Discharge (Final Psychosocial  Re-Evaluation):   Education: Education Goals: Education classes will be provided on a weekly basis, covering required topics. Participant will state understanding/return demonstration of topics presented.  Learning Barriers/Preferences:  Learning Barriers/Preferences - 06/10/23 1054       Learning Barriers/Preferences   Learning Barriers Hearing    Learning Preferences Group Instruction;Written Material             Education Topics: Know Your Numbers Group instruction that is supported by a PowerPoint presentation. Instructor discusses importance of knowing and understanding resting, exercise, and post-exercise oxygen saturation, heart rate, and blood pressure. Oxygen saturation, heart rate, blood pressure, rating of perceived exertion, and dyspnea are reviewed along with a normal range for these values.    Exercise for the Pulmonary Patient Group instruction that is supported by a PowerPoint presentation. Instructor discusses benefits of exercise, core components of exercise, frequency, duration, and intensity of an exercise routine, importance of utilizing pulse oximetry during exercise, safety while exercising, and options of places to exercise outside of rehab.    MET Level  Group instruction provided by PowerPoint, verbal discussion, and written material to support subject matter. Instructor reviews what METs are and how to increase METs.    Pulmonary Medications Verbally interactive group education provided by instructor with focus on inhaled medications and proper administration.   Anatomy and Physiology of the Respiratory System Group instruction provided by PowerPoint, verbal discussion, and written material to support subject matter. Instructor reviews respiratory cycle and anatomical components of the respiratory system and their functions. Instructor also reviews differences in obstructive and restrictive respiratory diseases with examples of each.    Oxygen  Safety Group instruction provided by PowerPoint, verbal discussion, and written material to support subject matter. There is an overview of "What is Oxygen" and "Why do we need it".  Instructor also reviews how to create a safe environment for oxygen use, the importance of using oxygen as prescribed, and the risks of noncompliance. There is a brief discussion on traveling with oxygen and resources the patient may utilize.   Oxygen Use Group instruction provided by PowerPoint, verbal discussion, and written material to discuss how supplemental oxygen is prescribed and different types of oxygen supply systems. Resources for  more information are provided.    Breathing Techniques Group instruction that is supported by demonstration and informational handouts. Instructor discusses the benefits of pursed lip and diaphragmatic breathing and detailed demonstration on how to perform both.     Risk Factor Reduction Group instruction that is supported by a PowerPoint presentation. Instructor discusses the definition of a risk factor, different risk factors for pulmonary disease, and how the heart and lungs work together.   Pulmonary Diseases Group instruction provided by PowerPoint, verbal discussion, and written material to support subject matter. Instructor gives an overview of the different type of pulmonary diseases. There is also a discussion on risk factors and symptoms as well as ways to manage the diseases.   Stress and Energy Conservation Group instruction provided by PowerPoint, verbal discussion, and written material to support subject matter. Instructor gives an overview of stress and the impact it can have on the body. Instructor also reviews ways to reduce stress. There is also a discussion on energy conservation and ways to conserve energy throughout the day.   Warning Signs and Symptoms Group instruction provided by PowerPoint, verbal discussion, and written material to support subject  matter. Instructor reviews warning signs and symptoms of stroke, heart attack, cold and flu. Instructor also reviews ways to prevent the spread of infection.   Other Education Group or individual verbal, written, or video instructions that support the educational goals of the pulmonary rehab program.    Knowledge Questionnaire Score:  Knowledge Questionnaire Score - 06/10/23 1338       Knowledge Questionnaire Score   Pre Score 12/18             Core Components/Risk Factors/Patient Goals at Admission:  Personal Goals and Risk Factors at Admission - 06/10/23 1054       Core Components/Risk Factors/Patient Goals on Admission    Weight Management Weight Loss    Improve shortness of breath with ADL's Yes    Intervention Provide education, individualized exercise plan and daily activity instruction to help decrease symptoms of SOB with activities of daily living.    Expected Outcomes Short Term: Improve cardiorespiratory fitness to achieve a reduction of symptoms when performing ADLs;Long Term: Be able to perform more ADLs without symptoms or delay the onset of symptoms    Heart Failure Yes    Intervention Provide a combined exercise and nutrition program that is supplemented with education, support and counseling about heart failure. Directed toward relieving symptoms such as shortness of breath, decreased exercise tolerance, and extremity edema.    Expected Outcomes Improve functional capacity of life;Short term: Attendance in program 2-3 days a week with increased exercise capacity. Reported lower sodium intake. Reported increased fruit and vegetable intake. Reports medication compliance.;Short term: Daily weights obtained and reported for increase. Utilizing diuretic protocols set by physician.;Long term: Adoption of self-care skills and reduction of barriers for early signs and symptoms recognition and intervention leading to self-care maintenance.             Core  Components/Risk Factors/Patient Goals Review:    Core Components/Risk Factors/Patient Goals at Discharge (Final Review):    ITP Comments:   Comments: Dr. Mechele Collin is Medical Director for Pulmonary Rehab at [facility].

## 2023-06-10 NOTE — Telephone Encounter (Signed)
 Pt insurance is active and benefits verified through [employer]. Co-pay $15.00, DED $0.00/$0.00 met, out of pocket $3,400.00/$212.33 met, co-insurance 0%. No pre-authorization required. Abitya/HTA, 06/10/23 @ 9:52AM, WGN#[id number]

## 2023-06-10 NOTE — Progress Notes (Signed)
 Dominic Kane 83 y.o. male  Pulmonary Rehab Orientation Note  This patient who was referred to Pulmonary Rehab by Dr. Verne Carrow (cardiology) with the diagnosis of diastolic and systolic heart failure arrived today in Cardiac and Pulmonary Rehab. He arrived ambulatory with normal gait. He does not carry portable oxygen. Patient is oriented to time and place. Patient's medical history, psychosocial health, and medications reviewed.   Psychosocial assessment reveals patient lives with spouse. Dominic Kane is currently retired. Patient hobbies include spending time with others, gardening, and spending time with his grandchildren . Patient reports his stress level is low. Areas of stress/anxiety include health. Patient does not exhibit signs of depression. Signs of depression include helplessness and fatigue. PHQ2/9 score 0/1. Dominic Kane shows good  coping skills with positive outlook on life. Offered emotional support and reassurance. Will continue to monitor and evaluate progress toward psychosocial goal(s) of decreased stress and better sleep quality.   Physical assessment reveals:  Well appearing, A&Ox4, NAD Eyes/Ears: wears glasses, bilateral hearing aids  Lungs: Inspiratory and expiratory wheezes, no rales, rhonchi, denies chronic cough, dyspnea on exertion, states he has a dry cough in AM, takes mucinex, cough medicine as needed Heart: Regular rate rhythm, no murmurs, no rubs, no clicks Gastrointestinal: abdomin soft, + bowel sounds in all 4 quads, denies recent weight gain or loss, constipation, takes daily stool softner Genitourinary: WNL, pt denies s/s Extremities:  +2 pulses, grip strength equal, strong, no edema, no cyanosis, no clubbing Integumentary: pt denies any rashes, open or non healing wounds Psy/Soc: Pt endorses poor sleep quality, has an appointment for possible OSA Assistive devices: Pt uses a blood sugar monitor, has shower grab bars, uses walking stick, and has frame  around toilet. Has ramp into house  Dominic Kane reports he  does take medications as prescribed. Patient states he  follows a regular  diet. The patient has been trying to lose weight through a healthy diet and exercise program.. Pt's weight will be monitored closely.   Demonstration and practice of PLB using pulse oximeter. Dominic Kane able to return demonstration satisfactorily. Safety and hand hygiene in the exercise area reviewed with patient. Dominic Kane voices understanding of the information reviewed. Department expectations discussed with patient and achievable goals were set. The patient shows enthusiasm about attending the program and we look forward to working with Dominic Kane completed a 6 min walk test today and is scheduled to begin exercise on 06/16/23 at 1315.   [id number] [facility]

## 2023-06-12 ENCOUNTER — Other Ambulatory Visit: Payer: Self-pay

## 2023-06-12 MED ORDER — ROSUVASTATIN CALCIUM 10 MG PO TABS: 10.0000 mg | ORAL_TABLET | Freq: Every day | ORAL | 3 refills | Status: AC

## 2023-06-16 ENCOUNTER — Encounter (HOSPITAL_COMMUNITY)
Admission: RE | Admit: 2023-06-16 | Discharge: 2023-06-16 | Disposition: A | Discharge status: Home / Self Care | Source: Ambulatory Visit | Attending: Cardiovascular Disease | Admitting: Cardiovascular Disease

## 2023-06-16 DIAGNOSIS — I5042 Chronic combined systolic (congestive) and diastolic (congestive) heart failure: Secondary | ICD-10-CM | POA: Diagnosis not present

## 2023-06-16 NOTE — Progress Notes (Signed)
 Daily Session Note  Patient Details  Name: Dominic Kane MRN: [id number] Date of Birth: 1940-04-05 Referring Provider:   Doristine Devoid Pulmonary Rehab Walk Test from 06/10/2023 in [facility] for Heart, Vascular, & Lung Health  Referring Provider Dr. Verne Carrow       Encounter Date: 06/16/2023  Check In:  Session Check In - 06/16/23 1310       Check-In   Supervising physician immediately available to respond to emergencies [facility] MD immediately available    Physician(s) Eligha Bridegroom, NP    Location [facility]    Staff Present Essie Hart, RN, Doris Cheadle, MS, ACSM-CEP, Exercise Physiologist;Johnny Hale Bogus, MS, Exercise Physiologist;Samantha Belarus, RD, LDN;Casey Smith, RT;Randi Reeve BS, ACSM-CEP, Exercise Physiologist    Virtual Visit No    Medication changes reported     No    Fall or balance concerns reported    Yes    Comments possible neuropathy in feet, denies falls in past year but states he trips    Tobacco Cessation No Change    Warm-up and Cool-down Performed as group-led instruction   only   Resistance Training Performed Yes    VAD Patient? No    PAD/SET Patient? No      Pain Assessment   Currently in Pain? No/denies    Pain Score 0-No pain    Multiple Pain Sites No             Capillary Blood Glucose: No results found for this or any previous visit (from the past 24 hours).    Social History   Tobacco Use  Smoking Status Former   Current packs/day: 0.00   Types: Cigarettes   Quit date: 07/03/1980   Years since quitting: 42.9  Smokeless Tobacco Never    Goals Met:  Exercise tolerated well No report of concerns or symptoms today Strength training completed today  Goals Unmet:  Not Applicable  Comments: Service time is from 1313 to 1445    Dr. Mechele Collin is Medical Director for Pulmonary Rehab at [facility].

## 2023-06-18 ENCOUNTER — Encounter (HOSPITAL_COMMUNITY)
Admission: RE | Admit: 2023-06-18 | Discharge: 2023-06-18 | Discharge status: Home / Self Care | Source: Ambulatory Visit | Attending: Cardiovascular Disease

## 2023-06-18 DIAGNOSIS — I5042 Chronic combined systolic (congestive) and diastolic (congestive) heart failure: Secondary | ICD-10-CM | POA: Diagnosis not present

## 2023-06-18 NOTE — Progress Notes (Signed)
 Daily Session Note  Patient Details  Name: Dominic Kane MRN: [id number] Date of Birth: 1941/02/05 Referring Provider:   Doristine Devoid Pulmonary Rehab Walk Test from 06/10/2023 in [facility] Heart, Vascular, & Lung Health  Referring Provider Dr. Verne Carrow       Encounter Date: 06/18/2023  Check In:  Session Check In - 06/18/23 1323       Check-In   Supervising physician immediately available to respond to emergencies [facility] MD immediately available    Physician(s) Jari Favre, PA    Location [facility]    Staff Present Essie Hart, RN, Doris Cheadle, MS, ACSM-CEP, Exercise Physiologist;Samantha Belarus, RD, LDN;Lenee Franze Katrinka Blazing, RT;Randi Reeve BS, ACSM-CEP, Exercise Physiologist;Bailey Wallace Cullens, MS, Exercise Physiologist    Virtual Visit No    Medication changes reported     No    Fall or balance concerns reported    Yes    Comments 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 No    PAD/SET Patient? No      Pain Assessment   Currently in Pain? No/denies    Pain Score 0-No pain    Multiple Pain Sites No             Capillary Blood Glucose: No results found for this or any previous visit (from the past 24 hours).    Social History   Tobacco Use  Smoking Status Former   Current packs/day: 0.00   Types: Cigarettes   Quit date: 07/03/1980   Years since quitting: 42.9  Smokeless Tobacco Never    Goals Met:  Proper associated with RPD/PD & O2 Sat Independence with exercise equipment Exercise tolerated well No report of concerns or symptoms today Strength training completed today  Goals Unmet:  Not Applicable  Comments: Service time is from 1311 to 1448.    Dr. Mechele Collin is Medical Director for Pulmonary Rehab at [facility].

## 2023-06-19 ENCOUNTER — Telehealth (HOSPITAL_COMMUNITY): Payer: Self-pay

## 2023-06-19 NOTE — Telephone Encounter (Signed)
 Dr. Clifton James, Can we have an order to increase Dominic Kane's target heart rate while exercising in pulmonary rehab?  Thanks, Durel Salts, RRT

## 2023-06-23 ENCOUNTER — Encounter: Payer: Self-pay | Admitting: Primary Care

## 2023-06-23 ENCOUNTER — Ambulatory Visit: Payer: PPO | Admitting: Primary Care

## 2023-06-23 ENCOUNTER — Encounter (HOSPITAL_COMMUNITY)
Admission: RE | Admit: 2023-06-23 | Discharge: 2023-06-23 | Disposition: A | Discharge status: Home / Self Care | Source: Ambulatory Visit | Attending: Cardiovascular Disease | Admitting: Cardiovascular Disease

## 2023-06-23 VITALS — BP 114/75 | HR 56 | Temp 97.8°F | Ht 72.0 in | Wt 215.8 lb

## 2023-06-23 VITALS — Wt 216.7 lb

## 2023-06-23 DIAGNOSIS — I5042 Chronic combined systolic (congestive) and diastolic (congestive) heart failure: Secondary | ICD-10-CM | POA: Insufficient documentation

## 2023-06-23 DIAGNOSIS — R0681 Apnea, not elsewhere classified: Secondary | ICD-10-CM | POA: Diagnosis not present

## 2023-06-23 DIAGNOSIS — R0683 Snoring: Secondary | ICD-10-CM | POA: Diagnosis not present

## 2023-06-23 NOTE — Progress Notes (Signed)
 Daily Session Note  Patient Details  Name: Dominic Kane MRN: [id number] Date of Birth: 10/08/1940 Referring Provider:   Doristine Devoid Pulmonary Rehab Walk Test from 06/10/2023 in [facility] for Heart, Vascular, & Lung Health  Referring Provider Dr. Verne Carrow       Encounter Date: 06/23/2023  Check In:  Session Check In - 06/23/23 1328       Check-In   Supervising physician immediately available to respond to emergencies [facility] MD immediately available    Physician(s) Jari Favre, PA    Location [facility]    Staff Present Essie Hart, RN, Doris Cheadle, MS, ACSM-CEP, Exercise Physiologist;Samantha Belarus, RD, LDN;Casey Smith, RT;Randi Reeve BS, ACSM-CEP, Exercise Physiologist    Virtual Visit No    Medication changes reported     No    Fall or balance concerns reported    Yes    Comments 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 No    PAD/SET Patient? No      Pain Assessment   Currently in Pain? No/denies    Pain Score 0-No pain    Multiple Pain Sites No             Capillary Blood Glucose: No results found for this or any previous visit (from the past 24 hours).   Exercise Prescription Changes - 06/23/23 1500       Response to Exercise   Blood Pressure (Admit) 98/60    Blood Pressure (Exercise) 130/60    Blood Pressure (Exit) 96/62    Heart Rate (Admit) 65 bpm    Heart Rate (Exercise) 97 bpm    Heart Rate (Exit) 81 bpm    Oxygen Saturation (Admit) 93 %    Oxygen Saturation (Exercise) 94 %    Oxygen Saturation (Exit) 92 %    Rating of Perceived Exertion (Exercise) 11    Perceived Dyspnea (Exercise) 1    Duration Continue with 30 min of aerobic exercise without signs/symptoms of physical distress.    Intensity THRR unchanged       Progression   Progression Continue to progress workloads to maintain intensity without signs/symptoms of physical distress.      Resistance Training   Training Prescription Yes    Weight blue bands    Reps 10-15    Time 10 Minutes      Recumbant Elliptical   Level 3    RPM 74    Watts 103    Minutes 15    METs 3.1      Track   Laps 20   (Large Circle Track)   Minutes 15    METs 3.55             Social History   Tobacco Use  Smoking Status Former   Current packs/day: 0.00   Types: Cigarettes   Quit date: 07/03/1980   Years since quitting: 43.0  Smokeless Tobacco Never    Goals Met:  Exercise tolerated well No report of concerns or symptoms today Strength training completed today  Goals Unmet:  Not Applicable  Comments: Service time is from 1312 to 1446    Dr. Mechele Collin is Medical Director for Pulmonary Rehab at [facility].

## 2023-06-23 NOTE — Progress Notes (Signed)
 @Patient  ID: Dominic Kane, male    DOB: 1940/05/19, 83 y.o.   MRN: [id number]  No chief complaint on file.   Referring provider: Excell Seltzer, MD  HPI: 83 year old male, former smoker.  Past medical history significant for myopathy, CHF, hypertension, transient hypotension, thoracic aortic aneurysm, GERD, hypothyroidism, papillary thyroid cancer, per lipidemia, iron deficiency anemia, vitamin D deficiency.   Previous [facility] pulmonary encounter 10/08/2022 Patient presents today for sleep consult. No known history of sleep apnea. He had a sleep study 20-25 years ago that was reported negative. He has never been on CPAP. Wife has noted patient snores loudly at night along with abnormal breathing pattern and gasping for air in his sleep. He feels he sleeps well at night. He feels rested when he wakes up. He does take a nap a couple times a week. His wife has noticed him fall asleep watching tv or when inactive.    06/23/2023 Discussed the use of AI scribe software for clinical note transcription with the patient, who gave verbal consent to proceed.  History of Present Illness   Dominic Kane is an 83 year old male with congestive heart failure and significant cardiac history who presents with shortness of breath and suspected sleep apnea. He is accompanied by his wife.  He experiences shortness of breath that is stable except with exertion or bending down. This symptom has been progressively worsening, and he has adapted to it as much as possible. He is currently attending pulmonary rehab for heart failure, but it is too early to determine its effectiveness. He manages his heart failure with Lasix 40 mg daily and Entresto. He also takes Crestor 10 mg for cholesterol management. He monitors his weight at home and is actively trying to lose weight, averaging about two pounds per week through diet and exercise.  He has a history of loud snoring with abnormal breathing patterns and waking up  gasping, as reported by his wife. He feels rested upon waking and occasionally naps during the week. A sleep study was previously ordered but not completed due to insurance delays. There is a moderate suspicion of sleep apnea, potentially contributing to his heart failure.  His cardiac history includes congestive heart failure, cardiomyopathy, and coronary artery disease. He has no history of arrhythmias like atrial fibrillation. A CTA of the chest in May 2024 showed mild bronchial thickening and trace atelectasis.  He is a former smoker, having quit in 1981.     Allergies  Allergen Reactions   Sulfonamide Derivatives Hives and Nausea And Vomiting   Codeine Nausea Only    Immunization History  Administered Date(s) Administered   Fluad Quad(high Dose 65+) 11/17/2018   Influenza Whole 12/23/2006, 12/09/2007, 12/22/2008   Influenza, High Dose Seasonal PF 11/17/2014, 12/12/2015, 12/18/2016, 12/22/2017, 12/07/2019, 11/07/2020, 10/31/2021, 11/26/2022   Influenza,inj,Quad PF,6+ Mos 01/06/2013   Influenza-Unspecified 11/22/2013   Moderna Covid-19 Vaccine Bivalent Booster 39yrs & up 01/22/2021   Moderna Sars-Covid-2 Vaccination 04/06/2019, 05/04/2019, 02/20/2020   PNEUMOCOCCAL CONJUGATE-20 11/07/2020   Pfizer(Comirnaty)Fall Seasonal Vaccine 12 years and older 11/26/2022   Pneumococcal Conjugate-13 10/12/2014   Pneumococcal Polysaccharide-23 12/23/2006   Td 03/24/2001   Tdap 11/17/2014   Zoster Recombinant(Shingrix) 01/02/2022, 04/25/2022   Zoster, Live 06/23/2014    Past Medical History:  Diagnosis Date   Abnormality of gait    Acute sinusitis, unspecified    Allergy    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 15 yrs ago x1   Hypertrophy of prostate without urinary obstruction and other lower urinary tract symptoms (LUTS)    Lipoprotein deficiencies    Other and unspecified hyperlipidemia    Other primary cardiomyopathies    Pain in joint, lower leg    Personal history of colonic polyps    Personal history of other diseases of digestive system    Pes anserinus tendinitis or bursitis    left shoulder remains an issue   Sinoatrial node dysfunction (HCC)    Dr. 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    Thoracic or lumbosacral neuritis or radiculitis, unspecified    Unspecified disorder of skin and subcutaneous tissue    Unspecified essential hypertension    Unspecified vitamin D deficiency     Tobacco History: Social History   Tobacco Use  Smoking Status Former   Current packs/day: 0.00   Types: Cigarettes   Quit date: 07/03/1980   Years since quitting: 43.0  Smokeless Tobacco Never   Counseling given: Not Answered   Outpatient Medications Prior to Visit  Medication Sig Dispense Refill   Accu-Chek Softclix Lancets lancets Use to check blood sugar once daily 100 each 3   acetaminophen (TYLENOL) 500 MG tablet Take 1,000 mg by mouth every 8 (eight) hours as needed for moderate pain (pain score 4-6).     aspirin EC 81 MG tablet Take 1 tablet (81 mg total) by mouth daily. Swallow whole. 30 tablet 12   Chlorpheniramine-DM (CORICIDIN HBP COUGH/COLD PO) Take 1 tablet by mouth daily as needed (congestion).     Cholecalciferol (VITAMIN  D-3) 125 MCG (5000 UT) TABS Take 5,000 Units by mouth at bedtime.     ciprofloxacin (CILOXAN) 0.3 % ophthalmic solution Place 2 drops into both eyes as needed (For Eye Injections).     ferrous sulfate 325 (65 FE) MG EC tablet 1 tablet with food every other day 100 tablet 3   finasteride (PROSCAR) 5 MG tablet Take 1 tablet (5 mg total) by mouth daily. 90 tablet 3   fluticasone (FLONASE) 50 MCG/ACT nasal spray Place 1 spray into both nostrils daily as needed for allergies or rhinitis.     furosemide (LASIX) 40 MG tablet Take 1 tablet (40 mg total) by mouth daily. 90 tablet 1   guaiFENesin (MUCINEX) 600 MG 12 hr tablet Take 600 mg by mouth 2 (two) times daily as needed for cough.     Lancets (ONETOUCH DELICA PLUS LANCET33G) MISC      levothyroxine (SYNTHROID) 175 MCG tablet Take 1 tablet (175 mcg total) by mouth daily. 30 tablet 11   Melatonin 5 MG CAPS Take 5 mg by mouth at bedtime.     Multiple Vitamins-Minerals (PRESERVISION AREDS 2 PO) Take 1 tablet by mouth in  the morning and at bedtime.     nitroGLYCERIN (NITROSTAT) 0.4 MG SL tablet Place 1 tablet (0.4 mg total) under the tongue every 5 (five) minutes as needed for chest pain. 25 tablet 3   omeprazole (PRILOSEC) 20 MG capsule Take 1 capsule (20 mg total) by mouth daily. 90 capsule 3   Phenylephrine-DM-GG-APAP (TYLENOL COLD/FLU SEVERE PO) Take 2 tablets by mouth daily as needed (cold symptoms/congestion). For High Blood Pressure     polyethylene glycol (MIRALAX / GLYCOLAX) 17 g packet Take 17 g by mouth daily.     PRESCRIPTION MEDICATION every 6 (six) months. *Antibiotic Injection in Eye*     rosuvastatin (CRESTOR) 10 MG tablet Take 1 tablet (10 mg total) by mouth daily. 90 tablet 3   sacubitril-valsartan (ENTRESTO) 24-26 MG Take 1 tablet by mouth 2 (two) times daily. 180 tablet 2   terazosin (HYTRIN) 10 MG capsule Take 1 capsule (10 mg total) by mouth at bedtime. 90 capsule 3   No facility-administered medications prior to visit.       Review of Systems  Review of Systems   Physical Exam  There were no vitals taken for this visit. Physical Exam Constitutional:      Appearance: Normal appearance. He is not ill-appearing.  HENT:     Head: Normocephalic and atraumatic.  Cardiovascular:     Rate and Rhythm: Normal rate and regular rhythm.  Pulmonary:     Effort: Pulmonary effort is normal.     Breath sounds: Normal breath sounds.  Musculoskeletal:        General: Normal range of motion.  Skin:    General: Skin is warm and dry.  Neurological:     General: No focal deficit present.     Mental Status: He is alert and oriented to person, place, and time. Mental status is at baseline.  Psychiatric:        Mood and Affect: Mood normal.        Behavior: Behavior normal.        Thought Content: Thought content normal.        Judgment: Judgment normal.      Lab Results:  CBC    Component Value Date/Time   WBC 8.8 04/28/2023 1435   WBC 8.4 12/30/2022 1100   RBC 4.33 04/28/2023 1435   RBC 3.72 (L) 12/30/2022 1100   HGB 12.6 (L) 05/01/2023 1328   HGB 13.2 04/28/2023 1435   HCT 37.0 (L) 05/01/2023 1328   HCT 39.6 04/28/2023 1435   PLT 165 04/28/2023 1435   MCV 92 04/28/2023 1435   MCH 30.5 04/28/2023 1435   MCH 31.0 03/06/2022 0021   MCHC 33.3 04/28/2023 1435   MCHC 32.9 12/30/2022 1100   RDW 11.9 04/28/2023 1435   LYMPHSABS 1.6 12/30/2022 1100   MONOABS 0.9 12/30/2022 1100   EOSABS 0.4 12/30/2022 1100   BASOSABS 0.0 12/30/2022 1100    BMET    Component Value Date/Time   NA 140 05/01/2023 1328   NA 141 07/18/2022 0839   K 4.2 05/01/2023 1328   CL 102 04/14/2023 0803   CO2 27 04/14/2023 0803   GLUCOSE 111 (H) 04/14/2023 0803   BUN 25 (H) 04/14/2023 0803   BUN 19 07/18/2022 0839   CREATININE 1.05 04/14/2023 0803   CREATININE 1.14 08/21/2022 1530   CALCIUM 9.3 04/14/2023 0803   GFRNONAA >60 03/07/2022 0636   GFRAA 81 07/20/2019 0913    BNP    Component Value Date/Time   BNP  149.7 (  H) 03/01/2022 1600    ProBNP No results found for: "PROBNP"  Imaging: No results found.   Assessment & Plan:   1. Loud snoring (Primary) - Home sleep test; Future  2. Witnessed episode of apnea - Home sleep test; Future  Assessment and Plan    Suspected Obstructive Sleep Apnea Moderate suspicion of obstructive sleep apnea due to symptoms of loud snoring, abnormal breathing patterns, and waking up gasping. Previous sleep study 20-25 years ago was negative. Sleep apnea may contribute to congestive heart failure.  - Order home sleep study to rule out sleep apnea - If sleep apnea is confirmed, initiate CPAP therapy  Congestive Heart Failure Chronic congestive heart failure with dyspnea, particularly with exertion or bending. Managed with Lasix and Entresto. Pulmonary rehab is ongoing. Dyspnea is likely cardiac-related rather than pulmonary. Weight loss and CPAP therapy, if sleep apnea is confirmed, may improve symptoms. - Continue Lasix 40 mg daily - Continue Entresto - Continue pulmonary rehab - Monitor weight at home - Encourage weight loss through diet and exercise - Consider formal pulmonary function test if no improvement with pulmonary rehab and negative sleep study  Coronary Artery Disease Coronary artery disease with significant cardiac history including cardiomyopathy and thoracic aortic aneurysm. Managed with Crestor. - Continue Crestor 10 mg daily       Glenford Bayley, NP 06/23/2023

## 2023-06-23 NOTE — Patient Instructions (Addendum)
 -  SUSPECTED OBSTRUCTIVE SLEEP APNEA: Obstructive sleep apnea is a condition where your breathing repeatedly stops and starts during sleep. We suspect you may have this due to your symptoms of loud snoring, abnormal breathing patterns, and waking up gasping. We will order a home sleep study to check for sleep apnea. If the home study is inconclusive, we may need to do an in-lab sleep study. If sleep apnea is confirmed, we will start CPAP therapy to help you breathe better at night.  -CONGESTIVE HEART FAILURE: Congestive heart failure is when your heart doesn't pump blood as well as it should. Your shortness of breath, especially with exertion or bending, is likely related to this. Continue taking Lasix 40 mg daily and Entresto as prescribed. Keep attending pulmonary rehab and monitor your weight at home. Keep working on losing weight through diet and exercise, as this can help improve your symptoms. If there is no improvement with pulmonary rehab and the sleep study is negative, we may consider a formal pulmonary function test.  -CORONARY ARTERY DISEASE: Coronary artery disease is when the blood vessels that supply blood to your heart become narrowed or blocked. Continue taking Crestor 10 mg daily to manage your cholesterol levels.  INSTRUCTIONS: Please complete the home sleep study as ordered. Continue with your current medications: Lasix 40 mg daily, Entresto, and Crestor 10 mg daily. Keep attending pulmonary rehab and monitor your weight at home. Focus on losing weight through diet and exercise. If the home sleep study is inconclusive, we may need to schedule an in-lab sleep study. Follow up with [place] after completing the sleep study to discuss the results and next steps.  Follow-up 3 months with Waynetta Sandy NP

## 2023-06-25 ENCOUNTER — Telehealth (HOSPITAL_COMMUNITY): Payer: Self-pay

## 2023-06-25 ENCOUNTER — Encounter (HOSPITAL_COMMUNITY)
Admission: RE | Admit: 2023-06-25 | Discharge: 2023-06-25 | Disposition: A | Discharge status: Home / Self Care | Source: Ambulatory Visit | Attending: Cardiovascular Disease | Admitting: Cardiovascular Disease

## 2023-06-25 ENCOUNTER — Encounter (HOSPITAL_COMMUNITY): Admission: RE | Admit: 2023-06-25 | Source: Ambulatory Visit

## 2023-06-25 DIAGNOSIS — I5042 Chronic combined systolic (congestive) and diastolic (congestive) heart failure: Secondary | ICD-10-CM | POA: Diagnosis not present

## 2023-06-25 NOTE — Progress Notes (Signed)
 Daily Session Note  Patient Details  Name: Dominic Kane MRN: [id number] Date of Birth: 03-28-1940 Referring Provider:   Doristine Devoid Pulmonary Rehab Walk Test from 06/10/2023 in [facility]  Referring Provider Dr. 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 No    PAD/SET Patient? No      Pain Assessment   Currently in Pain? No/denies    Multiple Pain Sites No             Capillary Blood Glucose: No results found for this or any previous visit (from the past 24 hours).    Social History   Tobacco Use  Smoking Status Former   Current packs/day: 0.00   Types: Cigarettes   Quit date: 07/03/1980   Years since quitting: 43.0  Smokeless Tobacco Never    Goals Met:  Exercise tolerated well No report of concerns or symptoms today Strength training completed today  Goals Unmet:  Not Applicable  Comments: Service time is from 1309 to 1446.    Dr. Mechele Collin is Medical Director for Pulmonary Rehab at [facility].

## 2023-06-25 NOTE — Telephone Encounter (Signed)
 Patient called out due to 'trouble with blood sugars'.

## 2023-06-26 NOTE — Telephone Encounter (Signed)
 Patient incorrectly marked as call-out, cancellation was meant for a different patient.

## 2023-06-30 ENCOUNTER — Telehealth: Payer: Self-pay

## 2023-06-30 ENCOUNTER — Encounter (HOSPITAL_COMMUNITY)
Admission: RE | Admit: 2023-06-30 | Discharge: 2023-06-30 | Disposition: A | Discharge status: Home / Self Care | Source: Ambulatory Visit | Attending: Cardiovascular Disease | Admitting: Cardiovascular Disease

## 2023-06-30 DIAGNOSIS — I5042 Chronic combined systolic (congestive) and diastolic (congestive) heart failure: Secondary | ICD-10-CM

## 2023-06-30 NOTE — Progress Notes (Signed)
 Daily Session Note  Patient Details  Name: Dominic Kane MRN: [id number] Date of Birth: 1940-09-08 Referring Provider:   Doristine Devoid Pulmonary Rehab Walk Test from 06/10/2023 in [facility], [facility]  Referring Provider Dr. 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 No    PAD/SET Patient? No      Pain Assessment   Currently in Pain? No/denies    Pain Score 0-No pain    Multiple Pain Sites No             Capillary Blood Glucose: No results found for this or any previous visit (from the past 24 hours).    Social History   Tobacco Use  Smoking Status Former   Current packs/day: 0.00   Types: Cigarettes   Quit date: 07/03/1980   Years since quitting: 43.0  Smokeless Tobacco Never    Goals Met:  Proper associated with RPD/PD & O2 Sat Independence with exercise equipment Exercise tolerated well No report of concerns or symptoms today Strength training completed today  Goals Unmet:  Not Applicable  Comments: Service time is from 1308 to 1446.    Dr. Mechele Collin is Medical Director for Pulmonary Rehab at [facility].

## 2023-06-30 NOTE — Telephone Encounter (Signed)
 Copied from CRM [id number]. Topic: Clinical - Prescription Issue >> Jun 30, 2023 10:51 AM Konrad Dolores wrote: Reason for CRM: Jola Babinski (spouse) called to request more information regarding the home sleep study patient Crit is going to be doing at home. Jola Babinski stated the company used through the clinic for delivering the home sleep study has been under investigation for medicare fraud for the sleep studies, and she would prefer if she could not go through them and instead pick up the equipment from the clinic directly. Please call her back at [phone].  ATC pt X1, LMTCB. Pt wife is NOT on DPR.

## 2023-07-02 ENCOUNTER — Encounter (HOSPITAL_COMMUNITY)
Admission: RE | Admit: 2023-07-02 | Discharge: 2023-07-02 | Disposition: A | Discharge status: Home / Self Care | Source: Ambulatory Visit | Attending: Cardiovascular Disease | Admitting: Cardiovascular Disease

## 2023-07-02 DIAGNOSIS — I5042 Chronic combined systolic (congestive) and diastolic (congestive) heart failure: Secondary | ICD-10-CM

## 2023-07-02 NOTE — Progress Notes (Signed)
 Daily Session Note  Patient Details  Name: Dominic Kane MRN: [id number] Date of Birth: 10-10-1940 Referring Provider:   Doristine Devoid Pulmonary Rehab Walk Test from 06/10/2023 in [facility]  Referring Provider Dr. 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 No    PAD/SET Patient? No      Pain Assessment   Currently in Pain? No/denies    Multiple Pain Sites No             Capillary Blood Glucose: No results found for this or any previous visit (from the past 24 hours).    Social History   Tobacco Use  Smoking Status Former   Current packs/day: 0.00   Types: Cigarettes   Quit date: 07/03/1980   Years since quitting: 43.0  Smokeless Tobacco Never    Goals Met:  Exercise tolerated well No report of concerns or symptoms today Strength training completed today  Goals Unmet:  Not Applicable  Comments: Service time is from 1314 to 1449    Dr. Mechele Collin is Medical Director for Pulmonary Rehab at [facility].

## 2023-07-02 NOTE — Telephone Encounter (Signed)
 Copied from CRM [id number]. Topic: General - Other >> Jul 01, 2023 12:39 PM Brennan Bailey S wrote: Reason for CRM: patient wife calling to speak with Breane Grunwald. DPR is on file and was signed 01/04/2020. Please call patient and wife back.  Beth, is this okay? See other note

## 2023-07-02 NOTE — Telephone Encounter (Signed)
**Note De-identified  [place] Obfuscation** Please advise 

## 2023-07-03 NOTE — Telephone Encounter (Signed)
 Yes, when I entered previous HST I put [facility] (not to use SNAP)

## 2023-07-06 NOTE — Telephone Encounter (Signed)
 Copied from CRM [id number]. Topic: Clinical - Prescription Issue >> Jul 02, 2023 10:54 AM Corean Deutscher wrote: Reason for CRM: Patient returning Caralyn Twining phone call regarding home sleep study. >> Jul 03, 2023 10:53 AM Isabell A wrote: Patient calling to speak with Rehmat Murtagh.   I called and spoke with pt. I informed pt that the order we sent was not sent for SNAP. Pt states he was getting emails from [facility] to fill out so he could receive his HST kit. I told pt I would inform his sleep doctor, Irby Mannan, NP regarding this to see if a new order may need to be placed. Pt verbalized understanding. Routing to [employer].

## 2023-07-06 NOTE — Telephone Encounter (Signed)
 Ashlyn this should go to [facility] team, I will forward to them

## 2023-07-07 ENCOUNTER — Encounter (HOSPITAL_COMMUNITY)
Admission: RE | Admit: 2023-07-07 | Discharge: 2023-07-07 | Disposition: A | Discharge status: Home / Self Care | Source: Ambulatory Visit | Attending: Cardiovascular Disease | Admitting: Cardiovascular Disease

## 2023-07-07 VITALS — Wt 215.8 lb

## 2023-07-07 DIAGNOSIS — I5042 Chronic combined systolic (congestive) and diastolic (congestive) heart failure: Secondary | ICD-10-CM | POA: Diagnosis not present

## 2023-07-07 NOTE — Progress Notes (Signed)
 Daily Session Note  Patient Details  Name: Dominic Kane MRN: [id number] Date of Birth: [birth year] Referring Provider:   Doristine Devoid Pulmonary Rehab Walk Test from 06/10/2023 in [facility]  Referring Provider Dr. 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 No    PAD/SET Patient? No      Pain Assessment   Currently in Pain? No/denies    Multiple Pain Sites No             Capillary Blood Glucose: No results found for this or any previous visit (from the past 24 hours).   Exercise Prescription Changes - 07/07/23 1500       Response to Exercise   Blood Pressure (Admit) 108/62    Blood Pressure (Exercise) 142/64    Blood Pressure (Exit) 101/58    Heart Rate (Admit) 63 bpm    Heart Rate (Exercise) 101 bpm    Heart Rate (Exit) 75 bpm    Oxygen Saturation (Admit) 96 %    Oxygen Saturation (Exercise) 92 %    Oxygen Saturation (Exit) 92 %    Rating of Perceived Exertion (Exercise) 11    Perceived Dyspnea (Exercise) 1    Duration Continue with 30 min of aerobic exercise without signs/symptoms of physical distress.    Intensity THRR unchanged      Progression   Progression Continue  to progress workloads to maintain intensity without signs/symptoms of physical distress.      Resistance Training   Training Prescription Yes    Weight blue bands    Reps 10-15    Time 10 Minutes      Treadmill   MPH 2    Grade 0.5    Minutes 15    METs 2.4      Recumbant Elliptical   Level 4    Minutes 15    METs 3.2             Social History   Tobacco Use  Smoking Status Former   Current packs/day: 0.00   Types: Cigarettes   Quit date: 07/03/1980   Years since quitting: 43.0  Smokeless Tobacco Never    Goals Met:  Proper associated with RPD/PD & O2 Sat Exercise tolerated well No report of concerns or symptoms today Strength training completed today  Goals Unmet:  Not Applicable  Comments: Service time is from 1306 to 1436.    Dr. Mechele Collin is Medical Director for Pulmonary Rehab at [facility].

## 2023-07-08 NOTE — Progress Notes (Signed)
 Pulmonary Individual Treatment Plan  Patient Details  Name: Dominic Kane MRN: [id number] Date of Birth: September 24, 1940 Referring Provider:   Gattis Kass Pulmonary Rehab Walk Test from 06/10/2023 in [facility]  Referring Provider Dr. Antoinette Batman       Initial Encounter Date:  Flowsheet Row Pulmonary Rehab Walk Test from 06/10/2023 in [facility]  Date 06/10/23       Visit Diagnosis: Heart failure, systolic and diastolic, chronic (HCC)  Patient's Home Medications on Admission:   Current Outpatient Medications:    Accu-Chek Softclix Lancets lancets, Use to check blood sugar once daily, Disp: 100 each, Rfl: 3   acetaminophen (TYLENOL) 500 MG tablet, Take 1,000 mg by mouth every 8 (eight) hours as needed for moderate pain (pain score 4-6)., Disp: , Rfl:    aspirin EC 81 MG tablet, Take 1 tablet (81 mg total) by mouth daily. Swallow whole., Disp: 30 tablet, Rfl: 12   Chlorpheniramine-DM (CORICIDIN HBP COUGH/COLD PO), Take 1 tablet by mouth daily as needed (congestion)., Disp: , Rfl:    Cholecalciferol (VITAMIN D-3) 125 MCG (5000 UT) TABS, Take 5,000 Units by mouth at bedtime., Disp: , Rfl:    ciprofloxacin (CILOXAN) 0.3 % ophthalmic solution, Place 2 drops into both eyes as needed (For Eye Injections)., Disp: , Rfl:    ferrous sulfate 325 (65 FE) MG EC tablet, 1 tablet with food every other day, Disp: 100 tablet, Rfl: 3   finasteride (PROSCAR) 5 MG tablet, Take 1 tablet (5 mg total) by mouth daily., Disp: 90 tablet, Rfl: 3   fluticasone (FLONASE) 50 MCG/ACT nasal spray, Place 1 spray into both nostrils daily as needed for allergies or rhinitis., Disp: , Rfl:    furosemide (LASIX) 40 MG tablet, Take 1 tablet (40 mg total) by mouth daily., Disp: 90 tablet, Rfl: 1   guaiFENesin (MUCINEX) 600 MG 12 hr tablet, Take 600 mg by mouth 2 (two) times daily as needed for  cough., Disp: , Rfl:    Lancets (ONETOUCH DELICA PLUS LANCET33G) MISC, , Disp: , Rfl:    levothyroxine (SYNTHROID) 175 MCG tablet, Take 1 tablet (175 mcg total) by mouth daily., Disp: 30 tablet, Rfl: 11   Melatonin 5 MG CAPS, Take 5 mg by mouth at bedtime., Disp: , Rfl:    Multiple Vitamins-Minerals (PRESERVISION AREDS 2 PO), Take 1 tablet by mouth in the morning and at bedtime., Disp: , Rfl:    nitroGLYCERIN (NITROSTAT) 0.4 MG SL tablet, Place 1 tablet (0.4 mg total) under the tongue every 5 (five) minutes as needed for chest pain., Disp: 25 tablet, Rfl: 3   omeprazole (PRILOSEC) 20 MG capsule, Take 1 capsule (20 mg total) by mouth daily., Disp: 90 capsule, Rfl: 3   Phenylephrine-DM-GG-APAP (TYLENOL COLD/FLU SEVERE PO), Take 2 tablets by mouth daily as needed (cold symptoms/congestion). For High Blood Pressure, Disp: , Rfl:    polyethylene glycol (MIRALAX / GLYCOLAX) 17 g packet, Take 17 g by mouth daily., Disp: , Rfl:    PRESCRIPTION MEDICATION, every 6 (six) months. 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 15 yrs ago x1   Hypertrophy of prostate without urinary obstruction and other lower urinary tract symptoms (LUTS)    Lipoprotein deficiencies    Other and unspecified hyperlipidemia    Other primary cardiomyopathies    Pain in joint, lower leg    Personal history of colonic polyps    Personal history of other diseases of digestive system    Pes anserinus tendinitis or bursitis    left shoulder remains an issue   Sinoatrial node dysfunction (HCC)    Dr. 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    Thoracic or lumbosacral neuritis or radiculitis, unspecified    Unspecified disorder of skin and subcutaneous tissue    Unspecified essential hypertension    Unspecified vitamin D deficiency     Tobacco Use: Social History   Tobacco Use  Smoking Status Former   Current packs/day: 0.00   Types: Cigarettes   Quit date: 07/03/1980   Years since quitting: 43.0  Smokeless Tobacco Never    Labs: Review Flowsheet  More data exists      Latest Ref Rng & Units 07/22/2021 10/22/2021 08/14/2022 04/14/2023 05/01/2023  Labs for ITP Cardiac and Pulmonary Rehab  Cholestrol 0 - 200 mg/dL - 086  - 578  -  LDL (calc) 0 - 99 mg/dL - 72  - 69  -  HDL-C >46.96 mg/dL - 29.52  - 84.13  -  Trlycerides 0.0 - 149.0 mg/dL - 24.4  - 01.0  -  Hemoglobin A1c 4.6 - 6.5 % 6.1  6.8  6.8  7.1  -  PH, Arterial 7.35 - 7.45 - - - - 7.383   PCO2 arterial 32 - 48 mmHg - - - - 37.2   Bicarbonate 20.0 - 28.0 mmol/L - - - - 24.1  21.4  22.1   TCO2 22 - 32 mmol/L - - - - 25  23  23    Acid-base deficit 0.0 - 2.0 mmol/L - - - - 1.0  5.0  3.0   O2 Saturation % - - - - 65  68  95      Details       Multiple values from one day are sorted in reverse-chronological order         Capillary Blood Glucose: Lab Results  Component Value Date   GLUCAP 109 (H) 05/01/2023   GLUCAP 85 03/09/2022   GLUCAP 120 (H) 03/08/2022   GLUCAP 136 (H)  03/08/2022   GLUCAP 89 03/08/2022    POCT Glucose     Row Name 06/10/23 1041             POCT Blood Glucose   Pre-Exercise 127 mg/dL                Pulmonary Assessment Scores:  Pulmonary Assessment Scores     Row Name 06/10/23 1226         ADL UCSD   ADL Phase Entry     SOB Score total 44       CAT Score   CAT Score 12       mMRC Score   mMRC Score 2             UCSD: Self-administered rating of dyspnea associated with activities of daily living (ADLs) 6-point scale (0 = "not at all" to 5 = "maximal or unable to do because of breathlessness")  Scoring Scores range from 0 to 120.  Minimally important difference is 5 units  CAT: CAT can identify the health impairment of COPD patients and is better correlated with disease progression.  CAT has a scoring range of zero to 40. The CAT score is classified into four groups of low (less than 10), medium (10 - 20), high (21-30) and very high (31-40) based on the impact level of disease on health status. A CAT score over 10 suggests significant symptoms.  A worsening CAT score could be explained by an exacerbation, poor medication adherence, poor inhaler technique, or progression of COPD or comorbid conditions.  CAT MCID is 2 points  mMRC: mMRC (Modified Medical Research Council) Dyspnea Scale is used to assess the degree of baseline functional disability in patients of respiratory disease due to dyspnea. No minimal important difference is established. A decrease in score of 1 point or greater is considered a positive change.   Pulmonary Function Assessment:  Pulmonary Function Assessment - 06/10/23 1226       Breath   Bilateral Breath Sounds  Wheezes;Expiratory;Inspiratory    Shortness of Breath Yes;Limiting activity;Fear of Shortness of Breath             Exercise Target Goals: Exercise Program Goal: Individual exercise prescription set using results from initial 6 min walk test and THRR while considering  patient's activity barriers and safety.   Exercise Prescription Goal: Initial exercise prescription builds to 30-45 minutes a day of aerobic activity, 2-3 days per week.  Home exercise guidelines will be given to patient during program as part of exercise prescription that the participant will acknowledge.  Activity Barriers & Risk Stratification:  Activity Barriers & Cardiac Risk Stratification - 06/10/23 1037       Activity Barriers & Cardiac Risk Stratification   Activity Barriers Muscular Weakness;Shortness of Breath;Deconditioning             6 Minute Walk:  6 Minute Walk     Row Name 06/10/23 1214         6 Minute Walk   Phase Initial     Distance 1140 feet     Walk Time 6 minutes     # of Rest Breaks 0     MPH 2.16     METS 1.59     RPE 9     Perceived Dyspnea  0     VO2 Peak 5.57     Symptoms No     Resting HR 67 bpm     Resting BP 106/66  Resting Oxygen Saturation  97 %     Exercise Oxygen Saturation  during 6 min walk 89 %     Max Ex. HR 74 bpm     Max Ex. BP 126/60     2 Minute Post BP 112/62       Interval HR   1 Minute HR 74     2 Minute HR 74     3 Minute HR 70     4 Minute HR 71     5 Minute HR 73     6 Minute HR 73     2 Minute Post HR 63     Interval Heart Rate? Yes       Interval Oxygen   Interval Oxygen? Yes     Baseline Oxygen Saturation % 97 %     1 Minute Oxygen Saturation % 93 %     1 Minute Liters of Oxygen 0 L     2 Minute Oxygen Saturation % 92 %     2 Minute Liters of Oxygen 0 L     3 Minute Oxygen Saturation % 91 %     3 Minute Liters of Oxygen 0 L     4 Minute Oxygen Saturation % 89 %     4 Minute Liters of Oxygen 0 L     5 Minute Oxygen  Saturation % 91 %     5 Minute Liters of Oxygen 0 L     6 Minute Oxygen Saturation % 90 %     6 Minute Liters of Oxygen 0 L     2 Minute Post Oxygen Saturation % 98 %     2 Minute Post Liters of Oxygen 0 L              Oxygen Initial Assessment:  Oxygen Initial Assessment - 06/10/23 1051       Home Oxygen   Home Oxygen Device None    Sleep Oxygen Prescription None    Home Exercise Oxygen Prescription None    Home Resting Oxygen Prescription None      Initial 6 min Walk   Oxygen Used None      Program Oxygen Prescription   Program Oxygen Prescription None      Intervention   Short Term Goals To learn and understand importance of maintaining oxygen saturations>88%;To learn and understand importance of monitoring SPO2 with pulse oximeter and demonstrate accurate use of the pulse oximeter.;To learn and demonstrate proper pursed lip breathing techniques or other breathing techniques.     Long  Term Goals Exhibits proper breathing techniques, such as pursed lip breathing or other method taught during program session;Verbalizes importance of monitoring SPO2 with pulse oximeter and return demonstration;Maintenance of O2 saturations>88%             Oxygen Re-Evaluation:  Oxygen Re-Evaluation     Row Name 07/03/23 0726             Program Oxygen Prescription   Program Oxygen Prescription None         Home Oxygen   Home Oxygen Device None       Sleep Oxygen Prescription None       Home Exercise Oxygen Prescription None       Home Resting Oxygen Prescription None         Goals/Expected Outcomes   Short Term Goals To learn and understand importance of maintaining oxygen saturations>88%;To learn and understand importance of monitoring SPO2 with pulse oximeter  and demonstrate accurate use of the pulse oximeter.;To learn and demonstrate proper pursed lip breathing techniques or other breathing techniques.        Long  Term Goals Exhibits proper breathing techniques, such as  pursed lip breathing or other method taught during program session;Verbalizes importance of monitoring SPO2 with pulse oximeter and return demonstration;Maintenance of O2 saturations>88%       Goals/Expected Outcomes Compliance and understanding of oxygen saturation monitoring and breath techniques to decrease shortness of breath.                Oxygen Discharge (Final Oxygen Re-Evaluation):  Oxygen Re-Evaluation - 07/03/23 0726       Program Oxygen Prescription   Program Oxygen Prescription None      Home Oxygen   Home Oxygen Device None    Sleep Oxygen Prescription None    Home Exercise Oxygen Prescription None    Home Resting Oxygen Prescription None      Goals/Expected Outcomes   Short Term Goals To learn and understand importance of maintaining oxygen saturations>88%;To learn and understand importance of monitoring SPO2 with pulse oximeter and demonstrate accurate use of the pulse oximeter.;To learn and demonstrate proper pursed lip breathing techniques or other breathing techniques.     Long  Term Goals Exhibits proper breathing techniques, such as pursed lip breathing or other method taught during program session;Verbalizes importance of monitoring SPO2 with pulse oximeter and return demonstration;Maintenance of O2 saturations>88%    Goals/Expected Outcomes Compliance and understanding of oxygen saturation monitoring and breath techniques to decrease shortness of breath.             Initial Exercise Prescription:  Initial Exercise Prescription - 06/10/23 1300       Date of Initial Exercise RX and Referring Provider   Date 06/10/23    Referring Provider Dr. Antoinette Batman    Expected Discharge Date 09/03/23      Recumbant Elliptical   Level 1    Minutes 15    METs 1.5      Track   Laps 5    Minutes 15    METs 1.77      Prescription Details   Frequency (times per week) 2    Duration Progress to 30 minutes of continuous aerobic without signs/symptoms of  physical distress      Intensity   THRR 40-80% of Max Heartrate 55-110    Ratings of Perceived Exertion 11-13    Perceived Dyspnea 0-4      Progression   Progression Continue to progress workloads to maintain intensity without signs/symptoms of physical distress.      Resistance Training   Training Prescription Yes    Weight blue bands    Reps 10-15             Perform Capillary Blood Glucose checks as needed.  Exercise Prescription Changes:   Exercise Prescription Changes     Row Name 06/23/23 1500 07/07/23 1500           Response to Exercise   Blood Pressure (Admit) 98/60 108/62      Blood Pressure (Exercise) 130/60 142/64      Blood Pressure (Exit) 96/62 101/58      Heart Rate (Admit) 65 bpm 63 bpm      Heart Rate (Exercise) 97 bpm 101 bpm      Heart Rate (Exit) 81 bpm 75 bpm      Oxygen Saturation (Admit) 93 % 96 %      Oxygen  Saturation (Exercise) 94 % 92 %      Oxygen Saturation (Exit) 92 % 92 %      Rating of Perceived Exertion (Exercise) 11 11      Perceived Dyspnea (Exercise) 1 1      Duration Continue with 30 min of aerobic exercise without signs/symptoms of physical distress. Continue with 30 min of aerobic exercise without signs/symptoms of physical distress.      Intensity THRR unchanged THRR unchanged        Progression   Progression Continue to progress workloads to maintain intensity without signs/symptoms of physical distress. Continue to progress workloads to maintain intensity without signs/symptoms of physical distress.        Resistance Training   Training Prescription Yes Yes      Weight blue bands blue bands      Reps 10-15 10-15      Time 10 Minutes 10 Minutes        Treadmill   MPH -- 2      Grade -- 0.5      Minutes -- 15      METs -- 2.4        Recumbant Elliptical   Level 3 4      RPM 74 --      Watts 103 --      Minutes 15 15      METs 3.1 3.2        Track   Laps 20  (Large Circle Track) --      Minutes 15 --       METs 3.55 --               Exercise Comments:   Exercise Comments     Row Name 06/16/23 1531           Exercise Comments Pt completed first day of group exercise. He exercised on the recumbent elliptical for 15 min, level 1, METs 2.7. He then walked on the track for 15 min, METs 2.54. Tolerated well. Needed demonstrative cues for warm up and cool down. Performed squats. Discussed METs briefly, will review.                Exercise Goals and Review:   Exercise Goals     Row Name 06/10/23 1050             Exercise Goals   Increase Physical Activity Yes       Intervention Provide advice, education, support and counseling about physical activity/exercise needs.;Develop an individualized exercise prescription for aerobic and resistive training based on initial evaluation findings, risk stratification, comorbidities and participant's personal goals.       Expected Outcomes Short Term: Attend rehab on a regular basis to increase amount of physical activity.;Long Term: Exercising regularly at least 3-5 days a week.;Long Term: Add in home exercise to make exercise part of routine and to increase amount of physical activity.       Increase Strength and Stamina Yes       Intervention Provide advice, education, support and counseling about physical activity/exercise needs.;Develop an individualized exercise prescription for aerobic and resistive training based on initial evaluation findings, risk stratification, comorbidities and participant's personal goals.       Expected Outcomes Short Term: Increase workloads from initial exercise prescription for resistance, speed, and METs.;Short Term: Perform resistance training exercises routinely during rehab and add in resistance training at home;Long Term: Improve cardiorespiratory fitness, muscular endurance and strength as measured by increased METs  and functional capacity ( )       Able to understand and use rate of perceived exertion  (RPE) scale Yes       Intervention Provide education and explanation on how to use RPE scale       Expected Outcomes Short Term: Able to use RPE daily in rehab to express subjective intensity level;Long Term:  Able to use RPE to guide intensity level when exercising independently       Able to understand and use Dyspnea scale Yes       Intervention Provide education and explanation on how to use Dyspnea scale       Expected Outcomes Short Term: Able to use Dyspnea scale daily in rehab to express subjective sense of shortness of breath during exertion;Long Term: Able to use Dyspnea scale to guide intensity level when exercising independently       Knowledge and understanding of Target Heart Rate Range (THRR) Yes       Intervention Provide education and explanation of THRR including how the numbers were predicted and where they are located for reference       Expected Outcomes Short Term: Able to state/look up THRR;Short Term: Able to use daily as guideline for intensity in rehab;Long Term: Able to use THRR to govern intensity when exercising independently       Understanding of Exercise Prescription Yes       Intervention Provide education, explanation, and written materials on patient's individual exercise prescription       Expected Outcomes Short Term: Able to explain program exercise prescription;Long Term: Able to explain home exercise prescription to exercise independently                Exercise Goals Re-Evaluation :  Exercise Goals Re-Evaluation     Row Name 07/03/23 0717             Exercise Goal Re-Evaluation   Exercise Goals Review Increase Physical Activity;Able to understand and use Dyspnea scale;Understanding of Exercise Prescription;Increase Strength and Stamina;Knowledge and understanding of Target Heart Rate Range (THRR);Able to understand and use rate of perceived exertion (RPE) scale       Comments Annette Stable has completed 6 exercise sessions. He is exercising on the  recumbent ellipitical for 15 min, level 3, METs 3.9. He recently has moved from the track to the treadmill, walking 15 min, speed 2.0 mph, 0 incline, METs 2.4. He became dizzy last 2 min therefore will need to evaluate again. He performs warm up and cool down with demonstrative cues at times due to being hard of hearing. His balance is poor. Using black bands, 7.3 lbs. Will progress as tolerated.       Expected Outcomes Through exercise at rehab and home, the patient will decrease shortness of breath with daily activities and feel confident in carrying out an exercise regimen at home.                Discharge Exercise Prescription (Final Exercise Prescription Changes):  Exercise Prescription Changes - 07/07/23 1500       Response to Exercise   Blood Pressure (Admit) 108/62    Blood Pressure (Exercise) 142/64    Blood Pressure (Exit) 101/58    Heart Rate (Admit) 63 bpm    Heart Rate (Exercise) 101 bpm    Heart Rate (Exit) 75 bpm    Oxygen Saturation (Admit) 96 %    Oxygen Saturation (Exercise) 92 %    Oxygen Saturation (Exit) 92 %  Rating of Perceived Exertion (Exercise) 11    Perceived Dyspnea (Exercise) 1    Duration Continue with 30 min of aerobic exercise without signs/symptoms of physical distress.    Intensity THRR unchanged      Progression   Progression Continue to progress workloads to maintain intensity without signs/symptoms of physical distress.      Resistance Training   Training Prescription Yes    Weight blue bands    Reps 10-15    Time 10 Minutes      Treadmill   MPH 2    Grade 0.5    Minutes 15    METs 2.4      Recumbant Elliptical   Level 4    Minutes 15    METs 3.2             Nutrition:  Target Goals: Understanding of nutrition guidelines, daily intake of sodium 1500mg , cholesterol 200mg , calories 30% from fat and 7% or less from saturated fats, daily to have 5 or more servings of fruits and vegetables.  Biometrics:  Pre Biometrics -  06/10/23 1225       Pre Biometrics   Grip Strength 30 kg              Nutrition Therapy Plan and Nutrition Goals:  Nutrition Therapy & Goals - 06/16/23 1434       Nutrition Therapy   Diet Heart Healthy Diet    Drug/Food Interactions Statins/Certain Fruits      Personal Nutrition Goals   Nutrition Goal Patient to improve diet quality by using the plate method as a guide for meal planning to include lean protein/plant protein, fruits, vegetables, whole grains, nonfat dairy as part of a well-balanced diet.    Personal Goal #2 Patient to identify strategies for blood sugar control with goal A1c <7%    Personal Goal #3 Patient to identify strategies for weight loss with goal 0.5-2.0# per week.    Comments Seanpatrick has medical history of CHF, HTN, CAD, hyperlipidemia. He is motivated to lose weight and improve blood sugar control. He reports 10-15# weight gain over the last year; DM2 has previously been controlled with diet. Patient will benefit from participation in pulmonary rehab for nutrition, exercise, and lifestyle modification.      Intervention Plan   Intervention Prescribe, educate and counsel regarding individualized specific dietary modifications aiming towards targeted core components such as weight, hypertension, lipid management, diabetes, heart failure and other comorbidities.;Nutrition handout(s) given to patient.    Expected Outcomes Short Term Goal: Understand basic principles of dietary content, such as calories, fat, sodium, cholesterol and nutrients.;Long Term Goal: Adherence to prescribed nutrition plan.             Nutrition Assessments:  MEDIFICTS Score Key: >=70 Need to make dietary changes  40-70 Heart Healthy Diet <= 40 Therapeutic Level Cholesterol Diet   Picture Your Plate Scores: <40 Unhealthy dietary pattern with much room for improvement. 41-50 Dietary pattern unlikely to meet recommendations for good health and room for improvement. 51-60 More  healthful dietary pattern, with some room for improvement.  >60 Healthy dietary pattern, although there may be some specific behaviors that could be improved.    Nutrition Goals Re-Evaluation:  Nutrition Goals Re-Evaluation     Row Name 06/16/23 1434             Goals   Current Weight 222 lb 0.1 oz (100.7 kg)       Comment A1c 7.1, lipds WNL, LDL 69  Expected Outcome Izaha has medical history of CHF, HTN, CAD, hyperlipidemia. He is motivated to lose weight and improve blood sugar control. He reports 10-15# weight gain over the last year; DM2 has previously been controlled with diet. Patient will benefit from participation in pulmonary rehab for nutrition, exercise, and lifestyle modification.                Nutrition Goals Discharge (Final Nutrition Goals Re-Evaluation):  Nutrition Goals Re-Evaluation - 06/16/23 1434       Goals   Current Weight 222 lb 0.1 oz (100.7 kg)    Comment A1c 7.1, lipds WNL, LDL 69    Expected Outcome Zebediah has medical history of CHF, HTN, CAD, hyperlipidemia. He is motivated to lose weight and improve blood sugar control. He reports 10-15# weight gain over the last year; DM2 has previously been controlled with diet. Patient will benefit from participation in pulmonary rehab for nutrition, exercise, and lifestyle modification.             Psychosocial: Target Goals: Acknowledge presence or absence of significant depression and/or stress, maximize coping skills, provide positive support system. Participant is able to verbalize types and ability to use techniques and skills needed for reducing stress and depression.  Initial Review & Psychosocial Screening:  Initial Psych Review & Screening - 06/10/23 1038       Initial Review   Current issues with Current Sleep Concerns   Pt has an upcoming appt for possible sleep apnea     Family Dynamics   Good Support System? Yes    Comments Good support from wife      Barriers   Psychosocial  barriers to participate in program Psychosocial barriers identified (see note)      Screening Interventions   Interventions Encouraged to exercise;Provide feedback about the scores to participant;To provide support and resources with identified psychosocial needs    Expected Outcomes Short Term goal: Utilizing psychosocial counselor, staff and physician to assist with identification of specific Stressors or current issues interfering with healing process. Setting desired goal for each stressor or current issue identified.;Long Term Goal: Stressors or current issues are controlled or eliminated.;Short Term goal: Identification and review with participant of any Quality of Life or Depression concerns found by scoring the questionnaire.;Long Term goal: The participant improves quality of Life and PHQ9 Scores as seen by post scores and/or verbalization of changes             Quality of Life Scores:  Scores of 19 and below usually indicate a poorer quality of life in these areas.  A difference of  2-3 points is a clinically meaningful difference.  A difference of 2-3 points in the total score of the Quality of Life Index has been associated with significant improvement in overall quality of life, self-image, physical symptoms, and general health in studies assessing change in quality of life.  PHQ-9: Review Flowsheet  More data exists      06/10/2023 04/29/2023 08/14/2022 04/28/2022 04/25/2021  Depression screen PHQ 2/9  Decreased Interest 0 0 0 0 0  Down, Depressed, Hopeless 0 0 0 0 0  PHQ - 2 Score 0 0 0 0 0  Altered sleeping 0 - - - -  Tired, decreased energy 1 - - - -  Change in appetite 0 - - - -  Feeling bad or failure about yourself  0 - - - -  Trouble concentrating 0 - - - -  Moving slowly or fidgety/restless 0 - - - -  Suicidal thoughts 0 - - - -  PHQ-9 Score 1 - - - -  Difficult doing work/chores Somewhat difficult - - - -   Interpretation of Total Score  Total Score Depression  Severity:  1-4 = Minimal depression, 5-9 = Mild depression, 10-14 = Moderate depression, 15-19 = Moderately severe depression, 20-27 = Severe depression   Psychosocial Evaluation and Intervention:  Psychosocial Evaluation - 06/10/23 1335       Psychosocial Evaluation & Interventions   Interventions Encouraged to exercise with the program and follow exercise prescription    Comments Daquon endorses sleep concerns. Gets up in the middle of the night to use restroom. Is scheduled to see MD about possible sleep apnea.    Expected Outcomes For Yohan's quality of sleep to improve. To see his MD about possible sleep apnea    Continue Psychosocial Services  No Follow up required             Psychosocial Re-Evaluation:  Psychosocial Re-Evaluation     Row Name 07/03/23 0932             Psychosocial Re-Evaluation   Current issues with Current Sleep Concerns       Comments Walid endorses poor sleep quality. He states that he gets up to use the restroom multiple times nightly. Educated pt to speak with MD about the issues and to cut fluids back before bed. Eufemio also stated that he is scheduled to have a sleep study next month. He is currently working with his medical team about his sleep concerns. Breccan denies any other psy/soc barriers or concerns.       Expected Outcomes For Geral to achieve better quality of sleep, to feel rested and less fatigued.       Interventions Encouraged to attend Pulmonary Rehabilitation for the exercise       Continue Psychosocial Services  Follow up required by staff                Psychosocial Discharge (Final Psychosocial Re-Evaluation):  Psychosocial Re-Evaluation - 07/03/23 0932       Psychosocial Re-Evaluation   Current issues with Current Sleep Concerns    Comments Resean endorses poor sleep quality. He states that he gets up to use the restroom multiple times nightly. Educated pt to speak with MD about the issues and to cut fluids  back before bed. Elijio also stated that he is scheduled to have a sleep study next month. He is currently working with his medical team about his sleep concerns. Jolan denies any other psy/soc barriers or concerns.    Expected Outcomes For Anthonymichael to achieve better quality of sleep, to feel rested and less fatigued.    Interventions Encouraged to attend Pulmonary Rehabilitation for the exercise    Continue Psychosocial Services  Follow up required by staff             Education: Education Goals: Education classes will be provided on a weekly basis, covering required topics. Participant will state understanding/return demonstration of topics presented.  Learning Barriers/Preferences:  Learning Barriers/Preferences - 06/10/23 1054       Learning Barriers/Preferences   Learning Barriers Hearing    Learning Preferences Group Instruction;Written Material             Education Topics: Know Your Numbers Group instruction that is supported by a PowerPoint presentation. Instructor discusses importance of knowing and understanding resting, exercise, and post-exercise oxygen saturation, heart rate, and blood pressure. Oxygen saturation, heart rate, blood pressure,  rating of perceived exertion, and dyspnea are reviewed along with a normal range for these values.  Flowsheet Row PULMONARY REHAB OTHER RESPIRATORY from 06/25/2023 in [facility] for Heart, Vascular, & Lung Health  Date 06/25/23  Educator EP  Instruction Review Code 1- Verbalizes Understanding       Exercise for the Pulmonary Patient Group instruction that is supported by a PowerPoint presentation. Instructor discusses benefits of exercise, core components of exercise, frequency, duration, and intensity of an exercise routine, importance of utilizing pulse oximetry during exercise, safety while exercising, and options of places to exercise outside of rehab.  Flowsheet Row PULMONARY REHAB OTHER  RESPIRATORY from 06/18/2023 in [facility] for Heart, Vascular, & Lung Health  Date 06/18/23  Educator EP  Instruction Review Code 1- Verbalizes Understanding       MET Level  Group instruction provided by PowerPoint, verbal discussion, and written material to support subject matter. Instructor reviews what METs are and how to increase METs.    Pulmonary Medications Verbally interactive group education provided by instructor with focus on inhaled medications and proper administration.   Anatomy and Physiology of the Respiratory System Group instruction provided by PowerPoint, verbal discussion, and written material to support subject matter. Instructor reviews respiratory cycle and anatomical components of the respiratory system and their functions. Instructor also reviews differences in obstructive and restrictive respiratory diseases with examples of each.    Oxygen Safety Group instruction provided by PowerPoint, verbal discussion, and written material to support subject matter. There is an overview of "What is Oxygen" and "Why do we need it".  Instructor also reviews how to create a safe environment for oxygen use, the importance of using oxygen as prescribed, and the risks of noncompliance. There is a brief discussion on traveling with oxygen and resources the patient may utilize. Flowsheet Row PULMONARY REHAB OTHER RESPIRATORY from 07/02/2023 in [facility] for Heart, Vascular, & Lung Health  Date 07/02/23  Educator EP  Instruction Review Code 1- Verbalizes Understanding       Oxygen Use Group instruction provided by PowerPoint, verbal discussion, and written material to discuss how supplemental oxygen is prescribed and different types of oxygen supply systems. Resources for more information are provided.    Breathing Techniques Group instruction that is supported by demonstration and informational handouts. Instructor discusses  the benefits of pursed lip and diaphragmatic breathing and detailed demonstration on how to perform both.     Risk Factor Reduction Group instruction that is supported by a PowerPoint presentation. Instructor discusses the definition of a risk factor, different risk factors for pulmonary disease, and how the heart and lungs work together.   Pulmonary Diseases Group instruction provided by PowerPoint, verbal discussion, and written material to support subject matter. Instructor gives an overview of the different type of pulmonary diseases. There is also a discussion on risk factors and symptoms as well as ways to manage the diseases.   Stress and Energy Conservation Group instruction provided by PowerPoint, verbal discussion, and written material to support subject matter. Instructor gives an overview of stress and the impact it can have on the body. Instructor also reviews ways to reduce stress. There is also a discussion on energy conservation and ways to conserve energy throughout the day.   Warning Signs and Symptoms Group instruction provided by PowerPoint, verbal discussion, and written material to support subject matter. Instructor reviews warning signs and symptoms of stroke, heart attack, cold and flu. Instructor  also reviews ways to prevent the spread of infection.   Other Education Group or individual verbal, written, or video instructions that support the educational goals of the pulmonary rehab program.    Knowledge Questionnaire Score:  Knowledge Questionnaire Score - 06/10/23 1338       Knowledge Questionnaire Score   Pre Score 12/18             Core Components/Risk Factors/Patient Goals at Admission:  Personal Goals and Risk Factors at Admission - 06/10/23 1054       Core Components/Risk Factors/Patient Goals on Admission    Weight Management Weight Loss    Improve shortness of breath with ADL's Yes    Intervention Provide education, individualized exercise  plan and daily activity instruction to help decrease symptoms of SOB with activities of daily living.    Expected Outcomes Short Term: Improve cardiorespiratory fitness to achieve a reduction of symptoms when performing ADLs;Long Term: Be able to perform more ADLs without symptoms or delay the onset of symptoms    Heart Failure Yes    Intervention Provide a combined exercise and nutrition program that is supplemented with education, support and counseling about heart failure. Directed toward relieving symptoms such as shortness of breath, decreased exercise tolerance, and extremity edema.    Expected Outcomes Improve functional capacity of life;Short term: Attendance in program 2-3 days a week with increased exercise capacity. Reported lower sodium intake. Reported increased fruit and vegetable intake. Reports medication compliance.;Short term: Daily weights obtained and reported for increase. Utilizing diuretic protocols set by physician.;Long term: Adoption of self-care skills and reduction of barriers for early signs and symptoms recognition and intervention leading to self-care maintenance.             Core Components/Risk Factors/Patient Goals Review:   Goals and Risk Factor Review     Row Name 07/03/23 0935             Core Components/Risk Factors/Patient Goals Review   Personal Goals Review Weight Management/Obesity;Heart Failure;Develop more efficient breathing techniques such as purse lipped breathing and diaphragmatic breathing and practicing self-pacing with activity.;Improve shortness of breath with ADL's       Review Monthly review of patient's Core Components/Risk Factors/Patient Goals are as follows: Goal progressing for improving shortness of breath with ADL's. Norm is currently able to maintain sats >88% on room air while exercising. He is exercising on the seated elliptical and treadmill. He is slowly making progress, he has completed 6 sessions so far. Linken has  increased his workload and METs on the elliptical and speed and incline on the treadmill. Deakon has met his goal of developing more efficient breathing techniques such as purse lipped breathing and diaphragmatic breathing; and practicing self-pacing with activity. Jaxsyn can initiate PLB independently. He knows how to slow down his pace or stop before becoming severely dyspneic. He is practicing diaphragmatic breathing at home, before bed. Goal progressing for weight loss. Lyonel is working with our dietician to obtain his weight goals. He has lost ~5.7# since starting the program. Goal progressing for continued reduced heart failure signs and symptoms, and reduced HF exacerbations. Keshaun is knowledgeable in sticking to a low salt, cardiac diet, weighing daily, and being compliant with his HF medications. We will continue to monitor Ayuub's progress throughout the program.       Expected Outcomes For Christifer to lose weight, improve shortness of breath with ADLs, and continued reduced heart failure signs and symptoms.  Core Components/Risk Factors/Patient Goals at Discharge (Final Review):   Goals and Risk Factor Review - 07/03/23 0935       Core Components/Risk Factors/Patient Goals Review   Personal Goals Review Weight Management/Obesity;Heart Failure;Develop more efficient breathing techniques such as purse lipped breathing and diaphragmatic breathing and practicing self-pacing with activity.;Improve shortness of breath with ADL's    Review Monthly review of patient's Core Components/Risk Factors/Patient Goals are as follows: Goal progressing for improving shortness of breath with ADL's. Autrey is currently able to maintain sats >88% on room air while exercising. He is exercising on the seated elliptical and treadmill. He is slowly making progress, he has completed 6 sessions so far. Raymont has increased his workload and METs on the elliptical and speed and incline on the  treadmill. Cordera has met his goal of developing more efficient breathing techniques such as purse lipped breathing and diaphragmatic breathing; and practicing self-pacing with activity. Tareek can initiate PLB independently. He knows how to slow down his pace or stop before becoming severely dyspneic. He is practicing diaphragmatic breathing at home, before bed. Goal progressing for weight loss. Cassell is working with our dietician to obtain his weight goals. He has lost ~5.7# since starting the program. Goal progressing for continued reduced heart failure signs and symptoms, and reduced HF exacerbations. Alrick is knowledgeable in sticking to a low salt, cardiac diet, weighing daily, and being compliant with his HF medications. We will continue to monitor Lynda's progress throughout the program.    Expected Outcomes For Davon to lose weight, improve shortness of breath with ADLs, and continued reduced heart failure signs and symptoms.             ITP Comments:Pt is making expected progress toward Pulmonary Rehab goals after completing 7 session(s). Recommend continued exercise, life style modification, education, and utilization of breathing techniques to increase stamina and strength, while also decreasing shortness of breath with exertion.  Dr. Genetta Kenning is Medical Director for Pulmonary Rehab at [facility].

## 2023-07-08 NOTE — Telephone Encounter (Signed)
 I sent an email to [place] to cancel the Snap HST. I sent a staff message to Michaelle Adolphus to authorize the patient's HST so we can have him scheduled. NFN.

## 2023-07-09 ENCOUNTER — Encounter (HOSPITAL_COMMUNITY)
Admission: RE | Admit: 2023-07-09 | Discharge: 2023-07-09 | Disposition: A | Discharge status: Home / Self Care | Source: Ambulatory Visit | Attending: Cardiovascular Disease | Admitting: Cardiovascular Disease

## 2023-07-09 DIAGNOSIS — I5042 Chronic combined systolic (congestive) and diastolic (congestive) heart failure: Secondary | ICD-10-CM | POA: Diagnosis not present

## 2023-07-09 NOTE — Progress Notes (Signed)
 Daily Session Note  Patient Details  Name: Dominic Kane MRN: [id number] Date of Birth: January 18, 1941 Referring Provider:   Gattis Kass Pulmonary Rehab Walk Test from 06/10/2023 in [facility] for Heart, Vascular, & Lung Health  Referring Provider Dr. Antoinette Batman       Encounter Date: 07/09/2023  Check In:  Session Check In - 07/09/23 1312       Check-In   Supervising physician immediately available to respond to emergencies [facility] MD immediately available    Physician(s) Charles Connor, NP    Location [facility]    Staff Present Willard Harman, RN, Shasta Deist, MS, ACSM-CEP, Exercise Physiologist;Maryellen Dowdle Mayme Spearman BS, ACSM-CEP, Exercise Physiologist;Johnny Alexia Angelucci, MS, Exercise Physiologist    Virtual Visit No    Medication changes reported     No    Fall or balance concerns reported    Yes    Comments 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 No    PAD/SET Patient? No      Pain Assessment   Currently in Pain? No/denies    Pain Score 0-No pain    Multiple Pain Sites No             Capillary Blood Glucose: No results found for this or any previous visit (from the past 24 hours).    Social History   Tobacco Use  Smoking Status Former   Current packs/day: 0.00   Types: Cigarettes   Quit date: 07/03/1980   Years since quitting: 43.0  Smokeless Tobacco Never    Goals Met:  Proper associated with RPD/PD & O2 Sat Independence with exercise equipment Exercise tolerated well No report of concerns or symptoms today Strength training completed today  Goals Unmet:  Not Applicable  Comments: Service time is from 1306 to 1437.    Dr. Genetta Kenning is Medical Director for Pulmonary Rehab at [facility].

## 2023-07-13 ENCOUNTER — Encounter (INDEPENDENT_AMBULATORY_CARE_PROVIDER_SITE_OTHER): Admitting: Ophthalmology

## 2023-07-13 DIAGNOSIS — H35372 Puckering of macula, left eye: Secondary | ICD-10-CM

## 2023-07-13 DIAGNOSIS — H34812 Central retinal vein occlusion, left eye, with macular edema: Secondary | ICD-10-CM

## 2023-07-13 DIAGNOSIS — H348112 Central retinal vein occlusion, right eye, stable: Secondary | ICD-10-CM

## 2023-07-13 DIAGNOSIS — H43813 Vitreous degeneration, bilateral: Secondary | ICD-10-CM

## 2023-07-13 DIAGNOSIS — I10 Essential (primary) hypertension: Secondary | ICD-10-CM

## 2023-07-13 DIAGNOSIS — H33303 Unspecified retinal break, bilateral: Secondary | ICD-10-CM | POA: Diagnosis not present

## 2023-07-13 DIAGNOSIS — H35033 Hypertensive retinopathy, bilateral: Secondary | ICD-10-CM | POA: Diagnosis not present

## 2023-07-14 ENCOUNTER — Encounter

## 2023-07-14 ENCOUNTER — Encounter (HOSPITAL_COMMUNITY)
Admission: RE | Admit: 2023-07-14 | Discharge: 2023-07-14 | Disposition: A | Discharge status: Home / Self Care | Source: Ambulatory Visit | Attending: Cardiovascular Disease | Admitting: Cardiovascular Disease

## 2023-07-14 DIAGNOSIS — R0683 Snoring: Secondary | ICD-10-CM

## 2023-07-14 DIAGNOSIS — R0681 Apnea, not elsewhere classified: Secondary | ICD-10-CM

## 2023-07-14 DIAGNOSIS — I5042 Chronic combined systolic (congestive) and diastolic (congestive) heart failure: Secondary | ICD-10-CM | POA: Diagnosis not present

## 2023-07-14 NOTE — Progress Notes (Signed)
 Daily Session Note  Patient Details  Name: Dominic Kane MRN: [id number] Date of Birth: [birth year] Referring Provider:   Gattis Kass Pulmonary Rehab Walk Test from 06/10/2023 in [facility] for Heart, Vascular, & Lung Health  Referring Provider Dr. Antoinette Batman       Encounter Date: 07/14/2023  Check In:  Session Check In - 07/14/23 1329       Check-In   Supervising physician immediately available to respond to emergencies [facility] MD immediately available    Physician(s) Marlana Silvan, NP    Location [facility]    Staff Present Atlas Lea, MS, ACSM-CEP, Exercise Physiologist;Latoshia Monrroy Mayme Spearman BS, ACSM-CEP, Exercise Physiologist;Johnny Alexia Angelucci, MS, Exercise Physiologist;Carlette Alaine Alken, RN, BSN    Virtual Visit No    Medication changes reported     No    Fall or balance concerns reported    Yes    Comments 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 No      Pain Assessment   Currently in Pain? No/denies    Pain Score 0-No pain    Multiple Pain Sites No             Capillary Blood Glucose: No results found for this or any previous visit (from the past 24 hours).    Social History   Tobacco Use  Smoking Status Former   Current packs/day: 0.00   Types: Cigarettes   Quit date: 07/03/1980   Years since quitting: 43.0  Smokeless Tobacco Never    Goals Met:  Proper associated with RPD/PD & O2 Sat Independence with exercise equipment Exercise tolerated well No report of concerns or symptoms today Strength training completed today  Goals Unmet:  Not Applicable  Comments: Service time is from 1306 to 1443.    Dr. Genetta Kenning is Medical Director for Pulmonary Rehab at [facility].

## 2023-07-16 ENCOUNTER — Encounter (HOSPITAL_COMMUNITY)
Admission: RE | Admit: 2023-07-16 | Discharge: 2023-07-16 | Disposition: A | Discharge status: Home / Self Care | Source: Ambulatory Visit | Attending: Cardiovascular Disease | Admitting: Cardiovascular Disease

## 2023-07-16 DIAGNOSIS — I5042 Chronic combined systolic (congestive) and diastolic (congestive) heart failure: Secondary | ICD-10-CM | POA: Diagnosis not present

## 2023-07-16 NOTE — Progress Notes (Signed)
 Daily Session Note  Patient Details  Name: Dominic Kane MRN: [id number] Date of Birth: [birth year] Referring Provider:   Gattis Kass Pulmonary Rehab Walk Test from 06/10/2023 in [facility] for Heart, Vascular, & Lung Health  Referring Provider Dr. Antoinette Batman       Encounter Date: 07/16/2023  Check In:  Session Check In - 07/16/23 1336       Check-In   Supervising physician immediately available to respond to emergencies [facility] MD immediately available    Physician(s) Lawana Pray, NP    [facility]    Staff Present Atlas Lea, MS, ACSM-CEP, Exercise Physiologist;Casey Mayme Spearman BS, ACSM-CEP, Exercise Physiologist;Kathey Simer Arlester Ladd, RN, BSN    Virtual Visit No    Medication changes reported     No    Fall or balance concerns reported    Yes    Comments 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 No    PAD/SET Patient? No      Pain Assessment   Currently in Pain? No/denies    Multiple Pain Sites No             Capillary Blood Glucose: No results found for this or any previous visit (from the past 24 hours).    Social History   Tobacco Use  Smoking Status Former   Current packs/day: 0.00   Types: Cigarettes   Quit date: 07/03/1980   Years since quitting: 43.0  Smokeless Tobacco Never    Goals Met:  Independence with exercise equipment Exercise tolerated well No report of concerns or symptoms today Strength training completed today  Goals Unmet:  Not Applicable  Comments: Service time is from 1314 to 1449    Dr. Genetta Kenning is Medical Director for Pulmonary Rehab at [facility].

## 2023-07-21 ENCOUNTER — Encounter (HOSPITAL_COMMUNITY)
Admission: RE | Admit: 2023-07-21 | Discharge: 2023-07-21 | Disposition: A | Discharge status: Home / Self Care | Source: Ambulatory Visit | Attending: Cardiovascular Disease | Admitting: Cardiovascular Disease

## 2023-07-21 VITALS — Wt 213.6 lb

## 2023-07-21 DIAGNOSIS — I5042 Chronic combined systolic (congestive) and diastolic (congestive) heart failure: Secondary | ICD-10-CM

## 2023-07-23 ENCOUNTER — Encounter (HOSPITAL_COMMUNITY)
Admission: RE | Admit: 2023-07-23 | Discharge: 2023-07-23 | Disposition: A | Discharge status: Home / Self Care | Source: Ambulatory Visit | Attending: Cardiovascular Disease | Admitting: Cardiovascular Disease

## 2023-07-23 DIAGNOSIS — I5042 Chronic combined systolic (congestive) and diastolic (congestive) heart failure: Secondary | ICD-10-CM | POA: Insufficient documentation

## 2023-07-23 NOTE — Progress Notes (Signed)
 Daily Session Note  Patient Details  Name: Dominic Kane MRN: [id number] Date of Birth: Aug 29, 1940 Referring Provider:   Gattis Kass Pulmonary Rehab Walk Test from 06/10/2023 in [facility] for Heart, Vascular, & Lung Health  Referring Provider Dr. Antoinette Batman       Encounter Date: 07/23/2023  Check In:  Session Check In - 07/23/23 1329       Check-In   Supervising physician immediately available to respond to emergencies [facility] MD immediately available    Physician(s) Koren Persons, NP    [facility]    Staff Present Atlas Lea, MS, ACSM-CEP, Exercise Physiologist;Randi Rochelle Chu, ACSM-CEP, Exercise Physiologist;Anielle Headrick Arlester Ladd, RN, Angie Kenner, RT    Virtual Visit No    Medication changes reported     No    Fall or balance concerns reported    Yes    Comments 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 No    PAD/SET Patient? No      Pain Assessment   Currently in Pain? No/denies    Pain Score 0-No pain    Multiple Pain Sites No             Capillary Blood Glucose: No results found for this or any previous visit (from the past 24 hours).    Social History   Tobacco Use  Smoking Status Former   Current packs/day: 0.00   Types: Cigarettes   Quit date: 07/03/1980   Years since quitting: 43.0  Smokeless Tobacco Never    Goals Met:  Exercise tolerated well No report of concerns or symptoms today Strength training completed today  Goals Unmet:  Not Applicable  Comments: Service time is from 1320 to 1450    Dr. Genetta Kenning is Medical Director for Pulmonary Rehab at [facility].

## 2023-07-28 ENCOUNTER — Encounter (HOSPITAL_COMMUNITY)
Admission: RE | Admit: 2023-07-28 | Discharge: 2023-07-28 | Disposition: A | Discharge status: Home / Self Care | Source: Ambulatory Visit | Attending: Cardiovascular Disease | Admitting: Cardiovascular Disease

## 2023-07-28 DIAGNOSIS — I5042 Chronic combined systolic (congestive) and diastolic (congestive) heart failure: Secondary | ICD-10-CM

## 2023-07-28 NOTE — Progress Notes (Signed)
 Daily Session Note  Patient Details  Name: Dominic Kane MRN: [id number] Date of Birth: 06/26/1940 Referring Provider:   Gattis Kass Pulmonary Rehab Walk Test from 06/10/2023 in [facility]  Referring Provider Dr. 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 No    PAD/SET Patient? No      Pain Assessment   Currently in Pain? No/denies    Multiple Pain Sites No             Capillary Blood Glucose: No results found for this or any previous visit (from the past 24 hours).    Social History   Tobacco Use  Smoking Status Former   Current packs/day: 0.00   Types: Cigarettes   Quit date: 07/03/1980   Years since quitting: 43.0  Smokeless Tobacco Never    Goals Met:  Proper associated with RPD/PD & O2 Sat Independence with exercise equipment Exercise tolerated well No report of concerns or symptoms today Strength training completed today  Goals Unmet:  Not Applicable  Comments: Service time is from 1310 to 1435.    Dr. Genetta Kenning is Medical Director for Pulmonary Rehab at [facility].

## 2023-07-30 ENCOUNTER — Encounter (HOSPITAL_COMMUNITY)
Admission: RE | Admit: 2023-07-30 | Discharge: 2023-07-30 | Disposition: A | Discharge status: Home / Self Care | Source: Ambulatory Visit | Attending: Cardiovascular Disease | Admitting: Cardiovascular Disease

## 2023-07-30 VITALS — Wt 214.3 lb

## 2023-07-30 DIAGNOSIS — I5042 Chronic combined systolic (congestive) and diastolic (congestive) heart failure: Secondary | ICD-10-CM

## 2023-07-30 DIAGNOSIS — G4733 Obstructive sleep apnea (adult) (pediatric): Secondary | ICD-10-CM | POA: Diagnosis not present

## 2023-07-30 NOTE — Progress Notes (Signed)
 Daily Session Note  Patient Details  Name: Dominic Kane MRN: [id number] Date of Birth: [birth year] Referring Provider:   Gattis Kass Pulmonary Rehab Walk Test from 06/10/2023 in [facility]  Referring Provider Dr. 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 No    PAD/SET Patient? No      Pain Assessment   Currently in Pain? No/denies    Multiple Pain Sites No             Capillary Blood Glucose: No results found for this or any previous visit (from the past 24 hours).    Social History   Tobacco Use  Smoking Status Former   Current packs/day: 0.00   Types: Cigarettes   Quit date: 07/03/1980   Years since quitting: 43.1  Smokeless Tobacco Never    Goals Met:  Exercise tolerated well No report of concerns or symptoms today Strength training completed today  Goals Unmet:  Not Applicable  Comments: Service time is from 1308 to 1446.    Dr. Genetta Kenning is Medical Director for Pulmonary Rehab at [facility].

## 2023-08-04 ENCOUNTER — Telehealth (HOSPITAL_COMMUNITY): Payer: Self-pay

## 2023-08-04 ENCOUNTER — Encounter (HOSPITAL_COMMUNITY): Admission: RE | Admit: 2023-08-04 | Source: Ambulatory Visit

## 2023-08-04 NOTE — Telephone Encounter (Addendum)
 Pt called out of PR for today. No reason given.

## 2023-08-05 NOTE — Progress Notes (Signed)
 Pulmonary Individual Treatment Plan  Patient Details  Name: Dominic Kane MRN: [id number] Date of Birth: 09-17-1940 Referring Provider:   Gattis Kass Pulmonary Rehab Walk Test from 06/10/2023 in [facility]  Referring Provider Dr. Antoinette Batman       Initial Encounter Date:  Flowsheet Row Pulmonary Rehab Walk Test from 06/10/2023 in [facility]  Date 06/10/23       Visit Diagnosis: Heart failure, systolic and diastolic, chronic (HCC)  Patient's Home Medications on Admission:   Current Outpatient Medications:    Accu-Chek Softclix Lancets lancets, Use to check blood sugar once daily, Disp: 100 each, Rfl: 3   acetaminophen  (TYLENOL ) 500 MG tablet, Take 1,000 mg by mouth every 8 (eight) hours as needed for moderate pain (pain score 4-6)., Disp: , Rfl:    aspirin  EC 81 MG tablet, Take 1 tablet (81 mg total) by mouth daily. Swallow whole., Disp: 30 tablet, Rfl: 12   Chlorpheniramine-DM (CORICIDIN HBP COUGH/COLD PO), Take 1 tablet by mouth daily as needed (congestion)., Disp: , Rfl:    Cholecalciferol (VITAMIN D -3) 125 MCG (5000 UT) TABS, Take 5,000 Units by mouth at bedtime., Disp: , Rfl:    ciprofloxacin (CILOXAN) 0.3 % ophthalmic solution, Place 2 drops into both eyes as needed (For Eye Injections)., Disp: , Rfl:    ferrous sulfate  325 (65 FE) MG EC tablet, 1 tablet with food every other day, Disp: 100 tablet, Rfl: 3   finasteride  (PROSCAR ) 5 MG tablet, Take 1 tablet (5 mg total) by mouth daily., Disp: 90 tablet, Rfl: 3   fluticasone (FLONASE) 50 MCG/ACT nasal spray, Place 1 spray into both nostrils daily as needed for allergies or rhinitis., Disp: , Rfl:    furosemide  (LASIX ) 40 MG tablet, Take 1 tablet (40 mg total) by mouth daily., Disp: 90 tablet, Rfl: 1   guaiFENesin  (MUCINEX ) 600 MG 12 hr tablet, Take 600 mg by mouth 2 (two) times daily as needed for  cough., Disp: , Rfl:    Lancets (ONETOUCH DELICA PLUS LANCET33G) MISC, , Disp: , Rfl:    levothyroxine  (SYNTHROID ) 175 MCG tablet, Take 1 tablet (175 mcg total) by mouth daily., Disp: 30 tablet, Rfl: 11   Melatonin 5 MG CAPS, Take 5 mg by mouth at bedtime., Disp: , Rfl:    Multiple Vitamins-Minerals (PRESERVISION AREDS 2 PO), Take 1 tablet by mouth in the morning and at bedtime., Disp: , Rfl:    nitroGLYCERIN  (NITROSTAT ) 0.4 MG SL tablet, Place 1 tablet (0.4 mg total) under the tongue every 5 (five) minutes as needed for chest pain., Disp: 25 tablet, Rfl: 3   omeprazole  (PRILOSEC) 20 MG capsule, Take 1 capsule (20 mg total) by mouth daily., Disp: 90 capsule, Rfl: 3   Phenylephrine -DM-GG-APAP (TYLENOL  COLD/FLU SEVERE PO), Take 2 tablets by mouth daily as needed (cold symptoms/congestion). For High Blood Pressure, Disp: , Rfl:    polyethylene glycol (MIRALAX  / GLYCOLAX ) 17 g packet, Take 17 g by mouth daily., Disp: , Rfl:    PRESCRIPTION MEDICATION, every 6 (six) months. 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 15 yrs ago x1   Hypertrophy of prostate without urinary obstruction and other lower urinary tract symptoms (LUTS)    Lipoprotein deficiencies    Other and unspecified hyperlipidemia    Other primary cardiomyopathies    Pain in joint, lower leg    Personal history of colonic polyps    Personal history of other diseases of digestive system    Pes anserinus tendinitis or bursitis    left shoulder remains an issue   Sinoatrial node dysfunction (HCC)    Dr. 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    Thoracic or lumbosacral neuritis or radiculitis, unspecified    Unspecified disorder of skin and subcutaneous tissue    Unspecified essential hypertension    Unspecified vitamin D  deficiency     Tobacco Use: Social History   Tobacco Use  Smoking Status Former   Current packs/day: 0.00   Types: Cigarettes   Quit date: 07/03/1980   Years since quitting: 43.1  Smokeless Tobacco Never    Labs: Review Flowsheet  More data exists      Latest Ref Rng & Units 07/22/2021 10/22/2021 08/14/2022 04/14/2023 05/01/2023  Labs for ITP Cardiac and Pulmonary Rehab  Cholestrol 0 - 200 mg/dL - 098  - 119  -  LDL (calc) 0 - 99 mg/dL - 72  - 69  -  HDL-C >14.78 mg/dL - 29.56  - 21.30  -  Trlycerides 0.0 - 149.0 mg/dL - 86.5  - 78.4  -  Hemoglobin A1c 4.6 - 6.5 % 6.1  6.8  6.8  7.1  -  PH, Arterial 7.35 - 7.45 - - - - 7.383   PCO2 arterial 32 - 48 mmHg - - - - 37.2   Bicarbonate 20.0 - 28.0 mmol/L - - - - 24.1  21.4  22.1   TCO2 22 - 32 mmol/L - - - - 25  23  23    Acid-base deficit 0.0 - 2.0 mmol/L - - - - 1.0  5.0  3.0   O2 Saturation % - - - - 65  68  95      Details       Multiple values from one day are sorted in reverse-chronological order         Capillary Blood Glucose: Lab Results  Component Value Date   GLUCAP 109 (H) 05/01/2023   GLUCAP 85 03/09/2022   GLUCAP 120 (H) 03/08/2022   GLUCAP 136 (H)  03/08/2022   GLUCAP 89 03/08/2022    POCT Glucose     Row Name 06/10/23 1041             POCT Blood Glucose   Pre-Exercise 127 mg/dL                Pulmonary Assessment Scores:  Pulmonary Assessment Scores     Row Name 06/10/23 1226         ADL UCSD   ADL Phase Entry     SOB Score total 44       CAT Score   CAT Score 12       mMRC Score   mMRC Score 2             UCSD: Self-administered rating of dyspnea associated with activities of daily living (ADLs) 6-point scale (0 = "not at all" to 5 = "maximal or unable to do because of breathlessness")  Scoring Scores range from 0 to 120.  Minimally important difference is 5 units  CAT: CAT can identify the health impairment of COPD patients and is better correlated with disease progression.  CAT has a scoring range of zero to 40. The CAT score is classified into four groups of low (less than 10), medium (10 - 20), high (21-30) and very high (31-40) based on the impact level of disease on health status. A CAT score over 10 suggests significant symptoms.  A worsening CAT score could be explained by an exacerbation, poor medication adherence, poor inhaler technique, or progression of COPD or comorbid conditions.  CAT MCID is 2 points  mMRC: mMRC (Modified Medical Research Council) Dyspnea Scale is used to assess the degree of baseline functional disability in patients of respiratory disease due to dyspnea. No minimal important difference is established. A decrease in score of 1 point or greater is considered a positive change.   Pulmonary Function Assessment:  Pulmonary Function Assessment - 06/10/23 1226       Breath   Bilateral Breath Sounds  Wheezes;Expiratory;Inspiratory    Shortness of Breath Yes;Limiting activity;Fear of Shortness of Breath             Exercise Target Goals: Exercise Program Goal: Individual exercise prescription set using results from initial 6 min walk test and THRR while considering  patient's activity barriers and safety.   Exercise Prescription Goal: Initial exercise prescription builds to 30-45 minutes a day of aerobic activity, 2-3 days per week.  Home exercise guidelines will be given to patient during program as part of exercise prescription that the participant will acknowledge.  Activity Barriers & Risk Stratification:  Activity Barriers & Cardiac Risk Stratification - 06/10/23 1037       Activity Barriers & Cardiac Risk Stratification   Activity Barriers Muscular Weakness;Shortness of Breath;Deconditioning             6 Minute Walk:  6 Minute Walk     Row Name 06/10/23 1214         6 Minute Walk   Phase Initial     Distance 1140 feet     Walk Time 6 minutes     # of Rest Breaks 0     MPH 2.16     METS 1.59     RPE 9     Perceived Dyspnea  0     VO2 Peak 5.57     Symptoms No     Resting HR 67 bpm     Resting BP 106/66  Resting Oxygen Saturation  97 %     Exercise Oxygen Saturation  during 6 min walk 89 %     Max Ex. HR 74 bpm     Max Ex. BP 126/60     2 Minute Post BP 112/62       Interval HR   1 Minute HR 74     2 Minute HR 74     3 Minute HR 70     4 Minute HR 71     5 Minute HR 73     6 Minute HR 73     2 Minute Post HR 63     Interval Heart Rate? Yes       Interval Oxygen   Interval Oxygen? Yes     Baseline Oxygen Saturation % 97 %     1 Minute Oxygen Saturation % 93 %     1 Minute Liters of Oxygen 0 L     2 Minute Oxygen Saturation % 92 %     2 Minute Liters of Oxygen 0 L     3 Minute Oxygen Saturation % 91 %     3 Minute Liters of Oxygen 0 L     4 Minute Oxygen Saturation % 89 %     4 Minute Liters of Oxygen 0 L     5 Minute Oxygen  Saturation % 91 %     5 Minute Liters of Oxygen 0 L     6 Minute Oxygen Saturation % 90 %     6 Minute Liters of Oxygen 0 L     2 Minute Post Oxygen Saturation % 98 %     2 Minute Post Liters of Oxygen 0 L              Oxygen Initial Assessment:  Oxygen Initial Assessment - 06/10/23 1051       Home Oxygen   Home Oxygen Device None    Sleep Oxygen Prescription None    Home Exercise Oxygen Prescription None    Home Resting Oxygen Prescription None      Initial 6 min Walk   Oxygen Used None      Program Oxygen Prescription   Program Oxygen Prescription None      Intervention   Short Term Goals To learn and understand importance of maintaining oxygen saturations>88%;To learn and understand importance of monitoring SPO2 with pulse oximeter and demonstrate accurate use of the pulse oximeter.;To learn and demonstrate proper pursed lip breathing techniques or other breathing techniques.     Long  Term Goals Exhibits proper breathing techniques, such as pursed lip breathing or other method taught during program session;Verbalizes importance of monitoring SPO2 with pulse oximeter and return demonstration;Maintenance of O2 saturations>88%             Oxygen Re-Evaluation:  Oxygen Re-Evaluation     Row Name 07/03/23 0726 07/30/23 0914           Program Oxygen Prescription   Program Oxygen Prescription None None        Home Oxygen   Home Oxygen Device None None      Sleep Oxygen Prescription None None      Home Exercise Oxygen Prescription None None      Home Resting Oxygen Prescription None None        Goals/Expected Outcomes   Short Term Goals To learn and understand importance of maintaining oxygen saturations>88%;To learn and understand importance of monitoring SPO2 with pulse oximeter  and demonstrate accurate use of the pulse oximeter.;To learn and demonstrate proper pursed lip breathing techniques or other breathing techniques.  To learn and understand importance of  maintaining oxygen saturations>88%;To learn and understand importance of monitoring SPO2 with pulse oximeter and demonstrate accurate use of the pulse oximeter.;To learn and demonstrate proper pursed lip breathing techniques or other breathing techniques.       Long  Term Goals Exhibits proper breathing techniques, such as pursed lip breathing or other method taught during program session;Verbalizes importance of monitoring SPO2 with pulse oximeter and return demonstration;Maintenance of O2 saturations>88% Exhibits proper breathing techniques, such as pursed lip breathing or other method taught during program session;Verbalizes importance of monitoring SPO2 with pulse oximeter and return demonstration;Maintenance of O2 saturations>88%      Goals/Expected Outcomes Compliance and understanding of oxygen saturation monitoring and breath techniques to decrease shortness of breath. Compliance and understanding of oxygen saturation monitoring and breath techniques to decrease shortness of breath.               Oxygen Discharge (Final Oxygen Re-Evaluation):  Oxygen Re-Evaluation - 07/30/23 0914       Program Oxygen Prescription   Program Oxygen Prescription None      Home Oxygen   Home Oxygen Device None    Sleep Oxygen Prescription None    Home Exercise Oxygen Prescription None    Home Resting Oxygen Prescription None      Goals/Expected Outcomes   Short Term Goals To learn and understand importance of maintaining oxygen saturations>88%;To learn and understand importance of monitoring SPO2 with pulse oximeter and demonstrate accurate use of the pulse oximeter.;To learn and demonstrate proper pursed lip breathing techniques or other breathing techniques.     Long  Term Goals Exhibits proper breathing techniques, such as pursed lip breathing or other method taught during program session;Verbalizes importance of monitoring SPO2 with pulse oximeter and return demonstration;Maintenance of O2  saturations>88%    Goals/Expected Outcomes Compliance and understanding of oxygen saturation monitoring and breath techniques to decrease shortness of breath.             Initial Exercise Prescription:  Initial Exercise Prescription - 06/10/23 1300       Date of Initial Exercise RX and Referring Provider   Date 06/10/23    Referring Provider Dr. Antoinette Batman    Expected Discharge Date 09/03/23      Recumbant Elliptical   Level 1    Minutes 15    METs 1.5      Track   Laps 5    Minutes 15    METs 1.77      Prescription Details   Frequency (times per week) 2    Duration Progress to 30 minutes of continuous aerobic without signs/symptoms of physical distress      Intensity   THRR 40-80% of Max Heartrate 55-110    Ratings of Perceived Exertion 11-13    Perceived Dyspnea 0-4      Progression   Progression Continue to progress workloads to maintain intensity without signs/symptoms of physical distress.      Resistance Training   Training Prescription Yes    Weight blue bands    Reps 10-15             Perform Capillary Blood Glucose checks as needed.  Exercise Prescription Changes:   Exercise Prescription Changes     Row Name 06/23/23 1500 07/07/23 1500 07/21/23 1500 07/30/23 1149       Response to Exercise  Blood Pressure (Admit) 98/60 108/62 106/50 92/58    Blood Pressure (Exercise) 130/60 142/64 130/70 --    Blood Pressure (Exit) 96/62 101/58 100/60 98/60    Heart Rate (Admit) 65 bpm 63 bpm 70 bpm 76 bpm    Heart Rate (Exercise) 97 bpm 101 bpm 102 bpm 115 bpm    Heart Rate (Exit) 81 bpm 75 bpm 79 bpm 91 bpm    Oxygen Saturation (Admit) 93 % 96 % 92 % 93 %    Oxygen Saturation (Exercise) 94 % 92 % 92 % 93 %    Oxygen Saturation (Exit) 92 % 92 % 92 % 91 %    Rating of Perceived Exertion (Exercise) 11 11 11 11     Perceived Dyspnea (Exercise) 1 1 1 1     Duration Continue with 30 min of aerobic exercise without signs/symptoms of physical  distress. Continue with 30 min of aerobic exercise without signs/symptoms of physical distress. Continue with 30 min of aerobic exercise without signs/symptoms of physical distress. Continue with 30 min of aerobic exercise without signs/symptoms of physical distress.    Intensity THRR unchanged THRR unchanged THRR unchanged THRR unchanged      Progression   Progression Continue to progress workloads to maintain intensity without signs/symptoms of physical distress. Continue to progress workloads to maintain intensity without signs/symptoms of physical distress. Continue to progress workloads to maintain intensity without signs/symptoms of physical distress. Continue to progress workloads to maintain intensity without signs/symptoms of physical distress.    Average METs -- -- -- 3.8      Resistance Training   Training Prescription Yes Yes Yes Yes    Weight blue bands blue bands blue bands black bands    Reps 10-15 10-15 10-15 10-15    Time 10 Minutes 10 Minutes 10 Minutes 10 Minutes      Treadmill   MPH -- 2 2.2 2.5    Grade -- 0.5 1.5 2    Minutes -- 15 15 15     METs -- 2.4 2.8 3.4      Recumbant Elliptical   Level 3 4 5 4     RPM 74 -- 61 60    Watts 103 -- 87 --    Minutes 15 15 15 15     METs 3.1 3.2 3.4 3.8      Track   Laps 20  Magazine features editor Track) -- -- --    Minutes 15 -- -- --    METs 3.55 -- -- --             Exercise Comments:   Exercise Comments     Row Name 06/16/23 1531           Exercise Comments Pt completed first day of group exercise. He exercised on the recumbent elliptical for 15 min, level 1, METs 2.7. He then walked on the track for 15 min, METs 2.54. Tolerated well. Needed demonstrative cues for warm up and cool down. Performed squats. Discussed METs briefly, will review.                Exercise Goals and Review:   Exercise Goals     Row Name 06/10/23 1050             Exercise Goals   Increase Physical Activity Yes        Intervention Provide advice, education, support and counseling about physical activity/exercise needs.;Develop an individualized exercise prescription for aerobic and resistive training based on initial evaluation findings, risk stratification, comorbidities  and participant's personal goals.       Expected Outcomes Short Term: Attend rehab on a regular basis to increase amount of physical activity.;Long Term: Exercising regularly at least 3-5 days a week.;Long Term: Add in home exercise to make exercise part of routine and to increase amount of physical activity.       Increase Strength and Stamina Yes       Intervention Provide advice, education, support and counseling about physical activity/exercise needs.;Develop an individualized exercise prescription for aerobic and resistive training based on initial evaluation findings, risk stratification, comorbidities and participant's personal goals.       Expected Outcomes Short Term: Increase workloads from initial exercise prescription for resistance, speed, and METs.;Short Term: Perform resistance training exercises routinely during rehab and add in resistance training at home;Long Term: Improve cardiorespiratory fitness, muscular endurance and strength as measured by increased METs and functional capacity ( )       Able to understand and use rate of perceived exertion (RPE) scale Yes       Intervention Provide education and explanation on how to use RPE scale       Expected Outcomes Short Term: Able to use RPE daily in rehab to express subjective intensity level;Long Term:  Able to use RPE to guide intensity level when exercising independently       Able to understand and use Dyspnea scale Yes       Intervention Provide education and explanation on how to use Dyspnea scale       Expected Outcomes Short Term: Able to use Dyspnea scale daily in rehab to express subjective sense of shortness of breath during exertion;Long Term: Able to use Dyspnea scale to  guide intensity level when exercising independently       Knowledge and understanding of Target Heart Rate Range (THRR) Yes       Intervention Provide education and explanation of THRR including how the numbers were predicted and where they are located for reference       Expected Outcomes Short Term: Able to state/look up THRR;Short Term: Able to use daily as guideline for intensity in rehab;Long Term: Able to use THRR to govern intensity when exercising independently       Understanding of Exercise Prescription Yes       Intervention Provide education, explanation, and written materials on patient's individual exercise prescription       Expected Outcomes Short Term: Able to explain program exercise prescription;Long Term: Able to explain home exercise prescription to exercise independently                Exercise Goals Re-Evaluation :  Exercise Goals Re-Evaluation     Row Name 07/03/23 0717 07/30/23 0914           Exercise Goal Re-Evaluation   Exercise Goals Review Increase Physical Activity;Able to understand and use Dyspnea scale;Understanding of Exercise Prescription;Increase Strength and Stamina;Knowledge and understanding of Target Heart Rate Range (THRR);Able to understand and use rate of perceived exertion (RPE) scale Increase Physical Activity;Able to understand and use Dyspnea scale;Understanding of Exercise Prescription;Increase Strength and Stamina;Knowledge and understanding of Target Heart Rate Range (THRR);Able to understand and use rate of perceived exertion (RPE) scale      Comments Dominic Kane has completed 6 exercise sessions. He is exercising on the recumbent ellipitical for 15 min, level 3, METs 3.9. He recently has moved from the track to the treadmill, walking 15 min, speed 2.0 mph, 0 incline, METs 2.4. He became dizzy last 2  min therefore will need to evaluate again. He performs warm up and cool down with demonstrative cues at times due to being hard of hearing. His balance  is poor. Using black bands, 7.3 lbs. Will progress as tolerated. Dominic Kane has completed 13 exercise sessions. He is exercising on the recumbent ellipitical for 15 min, level 4, METs 4.9. He is also walking on the treadmill 15 min, speed 2.5 mph, 2 incline, METs 3.4. He has progressed substantially in METs recently. His balance has improved. Using black bands, 7.3 lbs. Will progress as tolerated.      Expected Outcomes Through exercise at rehab and home, the patient will decrease shortness of breath with daily activities and feel confident in carrying out an exercise regimen at home. Through exercise at rehab and home, the patient will decrease shortness of breath with daily activities and feel confident in carrying out an exercise regimen at home.               Discharge Exercise Prescription (Final Exercise Prescription Changes):  Exercise Prescription Changes - 07/30/23 1149       Response to Exercise   Blood Pressure (Admit) 92/58    Blood Pressure (Exit) 98/60    Heart Rate (Admit) 76 bpm    Heart Rate (Exercise) 115 bpm    Heart Rate (Exit) 91 bpm    Oxygen Saturation (Admit) 93 %    Oxygen Saturation (Exercise) 93 %    Oxygen Saturation (Exit) 91 %    Rating of Perceived Exertion (Exercise) 11    Perceived Dyspnea (Exercise) 1    Duration Continue with 30 min of aerobic exercise without signs/symptoms of physical distress.    Intensity THRR unchanged      Progression   Progression Continue to progress workloads to maintain intensity without signs/symptoms of physical distress.    Average METs 3.8      Resistance Training   Training Prescription Yes    Weight black bands    Reps 10-15    Time 10 Minutes      Treadmill   MPH 2.5    Grade 2    Minutes 15    METs 3.4      Recumbant Elliptical   Level 4    RPM 60    Minutes 15    METs 3.8             Nutrition:  Target Goals: Understanding of nutrition guidelines, daily intake of sodium 1500mg , cholesterol  200mg , calories 30% from fat and 7% or less from saturated fats, daily to have 5 or more servings of fruits and vegetables.  Biometrics:  Pre Biometrics - 06/10/23 1225       Pre Biometrics   Grip Strength 30 kg              Nutrition Therapy Plan and Nutrition Goals:  Nutrition Therapy & Goals - 07/23/23 1344       Nutrition Therapy   Diet Heart Healthy Diet    Drug/Food Interactions Statins/Certain Fruits      Personal Nutrition Goals   Nutrition Goal Patient to improve diet quality by using the plate method as a guide for meal planning to include lean protein/plant protein, fruits, vegetables, whole grains, nonfat dairy as part of a well-balanced diet.   Goal in progress.   Personal Goal #2 Patient to identify strategies for blood sugar control with goal A1c <7%   goal in progress.   Personal Goal #3 Patient to identify strategies  for weight loss with goal 0.5-2.0# per week.   goal in progress.   Comments Goal in progress. Dominic Kane has medical history of CHF, HTN, CAD, hyperlipidemia. He is motivated to lose weight and improve blood sugar control. He reports 10-15# weight gain over the last year; DM2 has previously been controlled with diet. He is down 7.5# since starting with our program. Patient will benefit from participation in pulmonary rehab for nutrition, exercise, and lifestyle modification.      Intervention Plan   Intervention Prescribe, educate and counsel regarding individualized specific dietary modifications aiming towards targeted core components such as weight, hypertension, lipid management, diabetes, heart failure and other comorbidities.;Nutrition handout(s) given to patient.    Expected Outcomes Short Term Goal: Understand basic principles of dietary content, such as calories, fat, sodium, cholesterol and nutrients.;Long Term Goal: Adherence to prescribed nutrition plan.             Nutrition Assessments:  MEDIFICTS Score Key: >=70 Need to make  dietary changes  40-70 Heart Healthy Diet <= 40 Therapeutic Level Cholesterol Diet   Picture Your Plate Scores: <29 Unhealthy dietary pattern with much room for improvement. 41-50 Dietary pattern unlikely to meet recommendations for good health and room for improvement. 51-60 More healthful dietary pattern, with some room for improvement.  >60 Healthy dietary pattern, although there may be some specific behaviors that could be improved.    Nutrition Goals Re-Evaluation:  Nutrition Goals Re-Evaluation     Row Name 06/16/23 1434 07/23/23 1344           Goals   Current Weight 222 lb 0.1 oz (100.7 kg) 214 lb 8.1 oz (97.3 kg)      Comment A1c 7.1, lipds WNL, LDL 69 no new labs at this time; most recent labs A1c 7.1, lipds WNL, LDL 69      Expected Outcome Dominic Kane has medical history of CHF, HTN, CAD, hyperlipidemia. He is motivated to lose weight and improve blood sugar control. He reports 10-15# weight gain over the last year; DM2 has previously been controlled with diet. Patient will benefit from participation in pulmonary rehab for nutrition, exercise, and lifestyle modification. Goal in progress. Macy has medical history of CHF, HTN, CAD, hyperlipidemia. He is motivated to lose weight and improve blood sugar control. He reports 10-15# weight gain over the last year; DM2 has previously been controlled with diet. He is down 7.5# since starting with our program. Patient will benefit from participation in pulmonary rehab for nutrition, exercise, and lifestyle modification.               Nutrition Goals Discharge (Final Nutrition Goals Re-Evaluation):  Nutrition Goals Re-Evaluation - 07/23/23 1344       Goals   Current Weight 214 lb 8.1 oz (97.3 kg)    Comment no new labs at this time; most recent labs A1c 7.1, lipds WNL, LDL 69    Expected Outcome Goal in progress. Dominic Kane has medical history of CHF, HTN, CAD, hyperlipidemia. He is motivated to lose weight and improve blood sugar  control. He reports 10-15# weight gain over the last year; DM2 has previously been controlled with diet. He is down 7.5# since starting with our program. Patient will benefit from participation in pulmonary rehab for nutrition, exercise, and lifestyle modification.             Psychosocial: Target Goals: Acknowledge presence or absence of significant depression and/or stress, maximize coping skills, provide positive support system. Participant is able to verbalize types  and ability to use techniques and skills needed for reducing stress and depression.  Initial Review & Psychosocial Screening:  Initial Psych Review & Screening - 06/10/23 1038       Initial Review   Current issues with Current Sleep Concerns   Pt has an upcoming appt for possible sleep apnea     Family Dynamics   Good Support System? Yes    Comments Good support from wife      Barriers   Psychosocial barriers to participate in program Psychosocial barriers identified (see note)      Screening Interventions   Interventions Encouraged to exercise;Provide feedback about the scores to participant;To provide support and resources with identified psychosocial needs    Expected Outcomes Short Term goal: Utilizing psychosocial counselor, staff and physician to assist with identification of specific Stressors or current issues interfering with healing process. Setting desired goal for each stressor or current issue identified.;Long Term Goal: Stressors or current issues are controlled or eliminated.;Short Term goal: Identification and review with participant of any Quality of Life or Depression concerns found by scoring the questionnaire.;Long Term goal: The participant improves quality of Life and PHQ9 Scores as seen by post scores and/or verbalization of changes             Quality of Life Scores:  Scores of 19 and below usually indicate a poorer quality of life in these areas.  A difference of  2-3 points is a  clinically meaningful difference.  A difference of 2-3 points in the total score of the Quality of Life Index has been associated with significant improvement in overall quality of life, self-image, physical symptoms, and general health in studies assessing change in quality of life.  PHQ-9: Review Flowsheet  More data exists      06/10/2023 04/29/2023 08/14/2022 04/28/2022 04/25/2021  Depression screen PHQ 2/9  Decreased Interest 0 0 0 0 0  Down, Depressed, Hopeless 0 0 0 0 0  PHQ - 2 Score 0 0 0 0 0  Altered sleeping 0 - - - -  Tired, decreased energy 1 - - - -  Change in appetite 0 - - - -  Feeling bad or failure about yourself  0 - - - -  Trouble concentrating 0 - - - -  Moving slowly or fidgety/restless 0 - - - -  Suicidal thoughts 0 - - - -  PHQ-9 Score 1 - - - -  Difficult doing work/chores Somewhat difficult - - - -   Interpretation of Total Score  Total Score Depression Severity:  1-4 = Minimal depression, 5-9 = Mild depression, 10-14 = Moderate depression, 15-19 = Moderately severe depression, 20-27 = Severe depression   Psychosocial Evaluation and Intervention:  Psychosocial Evaluation - 06/10/23 1335       Psychosocial Evaluation & Interventions   Interventions Encouraged to exercise with the program and follow exercise prescription    Comments Dominic Kane endorses sleep concerns. Gets up in the middle of the night to use restroom. Is scheduled to see MD about possible sleep apnea.    Expected Outcomes For Dominic Kane quality of sleep to improve. To see his MD about possible sleep apnea    Continue Psychosocial Services  No Follow up required             Psychosocial Re-Evaluation:  Psychosocial Re-Evaluation     Row Name 07/03/23 0932 07/31/23 1040           Psychosocial Re-Evaluation   Current issues with Current  Sleep Concerns Current Sleep Concerns      Comments Dominic Kane endorses poor sleep quality. He states that he gets up to use the restroom multiple times  nightly. Educated pt to speak with MD about the issues and to cut fluids back before bed. Dominic Kane also stated that he is scheduled to have a sleep study next month. He is currently working with his medical team about his sleep concerns. Dominic Kane denies any other psy/soc barriers or concerns. Psychosocial monthly re-evaluation is as follows: Dominic Kane endorses poor sleep quality. He states that he gets up to use the restroom multiple times nightly. He has spoken to his PCP about it and he declined to start any medication at this time. He stated it is his usual routine and he is accustomed to it now. Dominic Kane is still waiting to complete his sleep study. His MD had to re-order the test. Dominic Kane denies any other psy/soc barriers or concerns and states he has great support at home.      Expected Outcomes For Dominic Kane to achieve better quality of sleep, to feel rested and less fatigued. For Dominic Kane to achieve better quality of sleep, to feel rested and less fatigued.      Interventions Encouraged to attend Pulmonary Rehabilitation for the exercise Encouraged to attend Pulmonary Rehabilitation for the exercise      Continue Psychosocial Services  Follow up required by staff Follow up required by staff               Psychosocial Discharge (Final Psychosocial Re-Evaluation):  Psychosocial Re-Evaluation - 07/31/23 1040       Psychosocial Re-Evaluation   Current issues with Current Sleep Concerns    Comments Psychosocial monthly re-evaluation is as follows: Dominic Kane endorses poor sleep quality. He states that he gets up to use the restroom multiple times nightly. He has spoken to his PCP about it and he declined to start any medication at this time. He stated it is his usual routine and he is accustomed to it now. Dominic Kane is still waiting to complete his sleep study. His MD had to re-order the test. Dominic Kane denies any other psy/soc barriers or concerns and states he has great support at home.    Expected Outcomes  For Dominic Kane to achieve better quality of sleep, to feel rested and less fatigued.    Interventions Encouraged to attend Pulmonary Rehabilitation for the exercise    Continue Psychosocial Services  Follow up required by staff             Education: Education Goals: Education classes will be provided on a weekly basis, covering required topics. Participant will state understanding/return demonstration of topics presented.  Learning Barriers/Preferences:  Learning Barriers/Preferences - 06/10/23 1054       Learning Barriers/Preferences   Learning Barriers Hearing    Learning Preferences Group Instruction;Written Material             Education Topics: Know Your Numbers Group instruction that is supported by a PowerPoint presentation. Instructor discusses importance of knowing and understanding resting, exercise, and post-exercise oxygen saturation, heart rate, and blood pressure. Oxygen saturation, heart rate, blood pressure, rating of perceived exertion, and dyspnea are reviewed along with a normal range for these values.  Flowsheet Row PULMONARY REHAB OTHER RESPIRATORY from 06/25/2023 in [facility]  Date 06/25/23  Educator EP  Instruction Review Code 1- Verbalizes Understanding       Exercise for the Pulmonary Patient Group instruction that  is supported by a PowerPoint presentation. Instructor discusses benefits of exercise, core components of exercise, frequency, duration, and intensity of an exercise routine, importance of utilizing pulse oximetry during exercise, safety while exercising, and options of places to exercise outside of rehab.  Flowsheet Row PULMONARY REHAB OTHER RESPIRATORY from 06/18/2023 in [facility]  Date 06/18/23  Educator EP  Instruction Review Code 1- Verbalizes Understanding       MET Level  Group instruction provided by  PowerPoint, verbal discussion, and written material to support subject matter. Instructor reviews what METs are and how to increase METs.    Pulmonary Medications Verbally interactive group education provided by instructor with focus on inhaled medications and proper administration.   Anatomy and Physiology of the Respiratory System Group instruction provided by PowerPoint, verbal discussion, and written material to support subject matter. Instructor reviews respiratory cycle and anatomical components of the respiratory system and their functions. Instructor also reviews differences in obstructive and restrictive respiratory diseases with examples of each.    Oxygen Safety Group instruction provided by PowerPoint, verbal discussion, and written material to support subject matter. There is an overview of "What is Oxygen" and "Why do we need it".  Instructor also reviews how to create a safe environment for oxygen use, the importance of using oxygen as prescribed, and the risks of noncompliance. There is a brief discussion on traveling with oxygen and resources the patient may utilize. Flowsheet Row PULMONARY REHAB OTHER RESPIRATORY from 07/02/2023 in [facility], [facility]  Date 07/02/23  Educator EP  Instruction Review Code 1- Verbalizes Understanding       Oxygen Use Group instruction provided by PowerPoint, verbal discussion, and written material to discuss how supplemental oxygen is prescribed and different types of oxygen supply systems. Resources for more information are provided.  Flowsheet Row PULMONARY REHAB OTHER RESPIRATORY from 07/09/2023 in [facility], [facility]  Date 07/09/23  Educator RT  Instruction Review Code 1- Verbalizes Understanding       Breathing Techniques Group instruction that is supported by demonstration and informational handouts. Instructor discusses the  benefits of pursed lip and diaphragmatic breathing and detailed demonstration on how to perform both.  Flowsheet Row PULMONARY REHAB OTHER RESPIRATORY from 07/16/2023 in [facility], [facility]  Date 07/16/23  Educator RN  Instruction Review Code 1- Verbalizes Understanding        Risk Factor Reduction Group instruction that is supported by a PowerPoint presentation. Instructor discusses the definition of a risk factor, different risk factors for pulmonary disease, and how the heart and lungs work together.   Pulmonary Diseases Group instruction provided by PowerPoint, verbal discussion, and written material to support subject matter. Instructor gives an overview of the different type of pulmonary diseases. There is also a discussion on risk factors and symptoms as well as ways to manage the diseases.   Stress and Energy Conservation Group instruction provided by PowerPoint, verbal discussion, and written material to support subject matter. Instructor gives an overview of stress and the impact it can have on the body. Instructor also reviews ways to reduce stress. There is also a discussion on energy conservation and ways to conserve energy throughout the day. Flowsheet Row PULMONARY REHAB OTHER RESPIRATORY from 07/23/2023 in [facility]  Date 07/23/23  Educator RN  Instruction Review Code 1- Verbalizes Understanding       Warning Signs and Symptoms Group instruction provided by PowerPoint, verbal discussion, and written material to support subject matter. Instructor reviews warning signs and symptoms of stroke, heart attack, cold and flu. Instructor also reviews ways to prevent the spread of infection. Flowsheet Row PULMONARY REHAB OTHER RESPIRATORY from 07/30/2023 in [facility] for [facility], [facility]  Date 07/30/23  Educator RN  Instruction Review  Code 1- Verbalizes Understanding       Other Education Group or individual verbal, written, or video instructions that support the educational goals of the pulmonary rehab program.    Knowledge Questionnaire Score:  Knowledge Questionnaire Score - 06/10/23 1338       Knowledge Questionnaire Score   Pre Score 12/18             Core Components/Risk Factors/Patient Goals at Admission:  Personal Goals and Risk Factors at Admission - 06/10/23 1054       Core Components/Risk Factors/Patient Goals on Admission    Weight Management Weight Loss    Improve shortness of breath with ADL's Yes    Intervention Provide education, individualized exercise plan and daily activity instruction to help decrease symptoms of SOB with activities of daily living.    Expected Outcomes Short Term: Improve cardiorespiratory fitness to achieve a reduction of symptoms when performing ADLs;Long Term: Be able to perform more ADLs without symptoms or delay the onset of symptoms    Heart Failure Yes    Intervention Provide a combined exercise and nutrition program that is supplemented with education, support and counseling about heart failure. Directed toward relieving symptoms such as shortness of breath, decreased exercise tolerance, and extremity edema.    Expected Outcomes Improve functional capacity of life;Short term: Attendance in program 2-3 days a week with increased exercise capacity. Reported lower sodium intake. Reported increased fruit and vegetable intake. Reports medication compliance.;Short term: Daily weights obtained and reported for increase. Utilizing diuretic protocols set by physician.;Long term: Adoption of self-care skills and reduction of barriers for Dominic Kane signs and symptoms recognition and intervention leading to self-care maintenance.             Core Components/Risk Factors/Patient Goals Review:   Goals and Risk Factor Review     Row Name 07/03/23 0935 07/31/23 1044            Core Components/Risk Factors/Patient Goals Review   Personal Goals Review Weight Management/Obesity;Heart Failure;Develop more efficient breathing techniques such as purse lipped breathing and diaphragmatic breathing and practicing self-pacing with activity.;Improve shortness of breath with ADL's Weight Management/Obesity;Heart Failure;Improve shortness of breath with ADL's      Review Monthly review of patient's Core Components/Risk Factors/Patient Goals are as follows: Goal progressing for improving shortness of breath with ADL's. Tylon is currently able to maintain sats >88% on room air while exercising. He is exercising on the seated elliptical and treadmill. He is slowly making progress, he has completed 6 sessions so far. Dominic Kane has increased his workload and METs on the elliptical and speed and incline on the treadmill. Dominic Kane has met his goal of developing more efficient breathing techniques such as purse lipped breathing and diaphragmatic breathing; and practicing self-pacing with activity. Dominic Kane can initiate PLB independently. He knows how to slow down his pace or stop before becoming severely dyspneic. He is practicing diaphragmatic breathing at home, before bed. Goal progressing for weight loss. Dominic Kane is working with our dietician  to obtain his weight goals. He has lost ~5.7# since starting the program. Goal progressing for continued reduced heart failure signs and symptoms, and reduced HF exacerbations. Dominic Kane is knowledgeable in sticking to a low salt, cardiac diet, weighing daily, and being compliant with his HF medications. We will continue to monitor Dominic Kane's progress throughout the program. Monthly review of patient's Core Components/Risk Factors/Patient Goals are as follows: Goal progressing for improving shortness of breath with ADL's. Dominic Kane is currently able to maintain sats >88% on room air while exercising. He is exercising on the seated elliptical and treadmill. He has made  great progress. Dominic Kane has increased his workload and METs on the elliptical and speed and incline on the treadmill. Goal progressing for weight loss. Dominic Kane is working with our dietician to obtain his weight goals. He has lost ~3.5# since starting the program. Goal progressing for continued reduced heart failure signs and symptoms, and reduced HF exacerbations. Camren is knowledgeable about eating a low salt, cardiac diet, weighing daily, and being compliant with his HF medications. He has had no CHF exacerbations since starting the program. We will continue to monitor Jacob's progress throughout the program.      Expected Outcomes For Cayl to lose weight, improve shortness of breath with ADLs, and continued reduced heart failure signs and symptoms. Pt will show progress toward meeting expected goals and outcomes.               Core Components/Risk Factors/Patient Goals at Discharge (Final Review):   Goals and Risk Factor Review - 07/31/23 1044       Core Components/Risk Factors/Patient Goals Review   Personal Goals Review Weight Management/Obesity;Heart Failure;Improve shortness of breath with ADL's    Review Monthly review of patient's Core Components/Risk Factors/Patient Goals are as follows: Goal progressing for improving shortness of breath with ADL's. Brelan is currently able to maintain sats >88% on room air while exercising. He is exercising on the seated elliptical and treadmill. He has made great progress. Koleton has increased his workload and METs on the elliptical and speed and incline on the treadmill. Goal progressing for weight loss. Rayburn is working with our dietician to obtain his weight goals. He has lost ~3.5# since starting the program. Goal progressing for continued reduced heart failure signs and symptoms, and reduced HF exacerbations. Holston is knowledgeable about eating a low salt, cardiac diet, weighing daily, and being compliant with his HF medications. He has  had no CHF exacerbations since starting the program. We will continue to monitor Indiana's progress throughout the program.    Expected Outcomes Pt will show progress toward meeting expected goals and outcomes.             ITP Comments: Pt is making expected progress toward Pulmonary Rehab goals after completing 14 session(s). Recommend continued exercise, life style modification, education, and utilization of breathing techniques to increase stamina and strength, while also decreasing shortness of breath with exertion.     Comments: Dr. Genetta Kenning is Medical Director for Pulmonary Rehab at [facility].

## 2023-08-06 ENCOUNTER — Encounter (HOSPITAL_COMMUNITY)
Admission: RE | Admit: 2023-08-06 | Discharge: 2023-08-06 | Disposition: A | Discharge status: Home / Self Care | Source: Ambulatory Visit | Attending: Cardiovascular Disease | Admitting: Cardiovascular Disease

## 2023-08-06 DIAGNOSIS — I5042 Chronic combined systolic (congestive) and diastolic (congestive) heart failure: Secondary | ICD-10-CM | POA: Diagnosis not present

## 2023-08-06 NOTE — Progress Notes (Signed)
 Daily Session Note  Patient Details  Name: Dominic Kane MRN: [id number] Date of Birth: [birth year] Referring Provider:   Gattis Kass Pulmonary Rehab Walk Test from 06/10/2023 in [facility]  Referring Provider Dr. Antoinette Batman       Encounter Date: 08/06/2023  Check In:  Session Check In - 08/06/23 1328       Check-In   Supervising physician immediately available to respond to emergencies [facility] MD immediately available    Physician(s) Palmer Bobo, NP    Location [facility]    Staff Present Sueellen Emery BS, ACSM-CEP, Exercise Physiologist;Mansur Patti Arlester Ladd, RN, BSN;Casey Smith, RT;Johnny Alexia Angelucci, MS, Exercise Physiologist    Virtual Visit No    Medication changes reported     No    Fall or balance concerns reported    Yes    Comments 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 No    PAD/SET Patient? No      Pain Assessment   Currently in Pain? No/denies    Pain Score 0-No pain    Multiple Pain Sites No             Capillary Blood Glucose: No results found for this or any previous visit (from the past 24 hours).    Social History   Tobacco Use  Smoking Status Former   Current packs/day: 0.00   Types: Cigarettes   Quit date: 07/03/1980   Years since quitting: 43.1  Smokeless Tobacco Never    Goals Met:  Independence with exercise equipment Exercise tolerated well No report of concerns or symptoms today Strength training completed today  Goals Unmet:  Not Applicable  Comments: Service time is from 1310 to 1440    Dr. Genetta Kenning is Medical Director for Pulmonary Rehab at [facility].

## 2023-08-10 ENCOUNTER — Encounter (INDEPENDENT_AMBULATORY_CARE_PROVIDER_SITE_OTHER): Admitting: Ophthalmology

## 2023-08-10 DIAGNOSIS — H35033 Hypertensive retinopathy, bilateral: Secondary | ICD-10-CM | POA: Diagnosis not present

## 2023-08-10 DIAGNOSIS — I10 Essential (primary) hypertension: Secondary | ICD-10-CM | POA: Diagnosis not present

## 2023-08-10 DIAGNOSIS — H34812 Central retinal vein occlusion, left eye, with macular edema: Secondary | ICD-10-CM

## 2023-08-10 DIAGNOSIS — H33303 Unspecified retinal break, bilateral: Secondary | ICD-10-CM

## 2023-08-10 DIAGNOSIS — H35372 Puckering of macula, left eye: Secondary | ICD-10-CM | POA: Diagnosis not present

## 2023-08-10 DIAGNOSIS — H353112 Nonexudative age-related macular degeneration, right eye, intermediate dry stage: Secondary | ICD-10-CM | POA: Diagnosis not present

## 2023-08-10 DIAGNOSIS — H43813 Vitreous degeneration, bilateral: Secondary | ICD-10-CM | POA: Diagnosis not present

## 2023-08-10 DIAGNOSIS — H353121 Nonexudative age-related macular degeneration, left eye, early dry stage: Secondary | ICD-10-CM

## 2023-08-10 DIAGNOSIS — H348112 Central retinal vein occlusion, right eye, stable: Secondary | ICD-10-CM

## 2023-08-11 ENCOUNTER — Telehealth (HOSPITAL_COMMUNITY): Payer: Self-pay | Admitting: *Deleted

## 2023-08-11 ENCOUNTER — Encounter (HOSPITAL_COMMUNITY)
Admission: RE | Admit: 2023-08-11 | Discharge: 2023-08-11 | Disposition: A | Discharge status: Home / Self Care | Source: Ambulatory Visit | Attending: Cardiovascular Disease | Admitting: Cardiovascular Disease

## 2023-08-11 DIAGNOSIS — I5042 Chronic combined systolic (congestive) and diastolic (congestive) heart failure: Secondary | ICD-10-CM

## 2023-08-11 NOTE — Telephone Encounter (Signed)
 Informed pt of elevator being worked on. He sts the stairs will not be a problem. German Koller BS, ACSM-CEP 08/11/2023 9:48 AM

## 2023-08-11 NOTE — Progress Notes (Signed)
 Daily Session Note  Patient Details  Name: Dominic Kane MRN: [id number] Date of Birth: Jan 13, 1941 Referring Provider:   Gattis Kass Pulmonary Rehab Walk Test from 06/10/2023 in [facility] for Heart, Vascular, & Lung Health  Referring Provider Dr. Antoinette Batman       Encounter Date: 08/11/2023  Check In:  Session Check In - 08/11/23 1329       Check-In   Supervising physician immediately available to respond to emergencies [facility] MD immediately available    Physician(s) Palmer Bobo, NP    Location [facility]    Staff Present Sueellen Emery BS, ACSM-CEP, Exercise Physiologist;Mary Arlester Ladd, RN, BSN;Casey Smith, Emilio Harder, MS, ACSM-CEP, Exercise Physiologist    Virtual Visit No    Medication changes reported     No    Fall or balance concerns reported    Yes    Comments 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 No    PAD/SET Patient? No      Pain Assessment   Currently in Pain? No/denies    Multiple Pain Sites No             Capillary Blood Glucose: No results found for this or any previous visit (from the past 24 hours).    Social History   Tobacco Use  Smoking Status Former   Current packs/day: 0.00   Types: Cigarettes   Quit date: 07/03/1980   Years since quitting: 43.1  Smokeless Tobacco Never    Goals Met:  Proper associated with RPD/PD & O2 Sat Exercise tolerated well No report of concerns or symptoms today Strength training completed today  Goals Unmet:  Not Applicable  Comments: Service time is from 1312 to 1440.    Dr. Genetta Kenning is Medical Director for Pulmonary Rehab at [facility].

## 2023-08-13 ENCOUNTER — Encounter (HOSPITAL_COMMUNITY)
Admission: RE | Admit: 2023-08-13 | Discharge: 2023-08-13 | Disposition: A | Discharge status: Home / Self Care | Source: Ambulatory Visit | Attending: Cardiovascular Disease | Admitting: Cardiovascular Disease

## 2023-08-13 DIAGNOSIS — I5042 Chronic combined systolic (congestive) and diastolic (congestive) heart failure: Secondary | ICD-10-CM | POA: Diagnosis not present

## 2023-08-13 NOTE — Progress Notes (Signed)
 Daily Session Note  Patient Details  Name: Dominic Kane MRN: [id number] Date of Birth: 08-24-1940 Referring Provider:   Gattis Kass Pulmonary Rehab Walk Test from 06/10/2023 in [facility] for Heart, Vascular, & Lung Health  Referring Provider Dr. 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 No    PAD/SET Patient? No      Pain Assessment   Currently in Pain? No/denies    Multiple Pain Sites No             Capillary Blood Glucose: No results found for this or any previous visit (from the past 24 hours).    Social History   Tobacco Use  Smoking Status Former   Current packs/day: 0.00   Types: Cigarettes   Quit date: 07/03/1980   Years since quitting: 43.1  Smokeless Tobacco Never    Goals Met:  Proper associated with RPD/PD & O2 Sat Independence with exercise equipment Exercise tolerated well No report of concerns or symptoms today Strength training completed today  Goals Unmet:  Not Applicable  Comments: Service time is from 1305 to 1432.    Dr. Genetta Kenning is Medical Director for Pulmonary Rehab at [facility]

## 2023-08-18 ENCOUNTER — Encounter (HOSPITAL_COMMUNITY)
Admission: RE | Admit: 2023-08-18 | Discharge: 2023-08-18 | Disposition: A | Discharge status: Home / Self Care | Source: Ambulatory Visit | Attending: Cardiovascular Disease | Admitting: Cardiovascular Disease

## 2023-08-18 VITALS — Wt 214.1 lb

## 2023-08-18 DIAGNOSIS — I5042 Chronic combined systolic (congestive) and diastolic (congestive) heart failure: Secondary | ICD-10-CM

## 2023-08-18 NOTE — Progress Notes (Signed)
 Daily Session Note  Patient Details  Name: Dominic Kane MRN: [id number] Date of Birth: May 05, 1940 Referring Provider:   Gattis Kass Pulmonary Rehab Walk Test from 06/10/2023 in [facility] for Heart, Vascular, & Lung Health  Referring Provider Dr. Antoinette Batman       Encounter Date: 08/18/2023  Check In:  Session Check In - 08/18/23 1319       Check-In   Supervising physician immediately available to respond to emergencies [facility] immediately available    Physician(s) Charles Connor, NP    [facility]    Staff Present Sueellen Emery BS, ACSM-CEP, Exercise Physiologist;Raif Chachere Arlester Ladd, RN, Shasta Deist, MS, ACSM-CEP, Exercise Physiologist;Johnny Alexia Angelucci, MS, Exercise Physiologist    Virtual Visit No    Medication changes reported     No    Fall or balance concerns reported    Yes    Comments 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 No    PAD/SET Patient? No      Pain Assessment   Currently in Pain? No/denies    Pain Score 0-No pain    Multiple Pain Sites No             Capillary Blood Glucose: No results found for this or any previous visit (from the past 24 hours).   Exercise Prescription Changes - 08/18/23 1500       Response to Exercise   Blood Pressure (Admit) 100/62    Blood Pressure (Exercise) 126/64    Blood Pressure (Exit) 112/60    Heart Rate (Admit) 61 bpm    Heart Rate (Exercise) 117 bpm    Heart Rate (Exit) 89 bpm    Oxygen Saturation (Admit) 96 %    Oxygen Saturation (Exercise) 93 %    Oxygen Saturation (Exit) 91 %    Rating of Perceived Exertion (Exercise) 11    Perceived Dyspnea (Exercise) 1    Duration Continue with 30 min of aerobic exercise without signs/symptoms of physical distress.    Intensity THRR  unchanged      Resistance Training   Training Prescription Yes    Weight black bands    Reps 10-15    Time 10 Minutes      Treadmill   MPH 2.7    Grade 3    Minutes 15    METs 4      Recumbant Elliptical   Level 4    RPM 70    Watts 110    Minutes 15    METs 4.4             Social History   Tobacco Use  Smoking Status Former   Current packs/day: 0.00   Types: Cigarettes   Quit date: 07/03/1980   Years since quitting: 43.1  Smokeless Tobacco Never    Goals Met:  Independence with exercise equipment Exercise tolerated well No report of concerns or symptoms today Strength training completed today  Goals Unmet:  Not Applicable  Comments: Service time is from 1306 to 1439    Dr. Genetta Kenning is Medical Director for Pulmonary Rehab at [facility].

## 2023-08-19 ENCOUNTER — Ambulatory Visit (HOSPITAL_COMMUNITY)
Admission: RE | Admit: 2023-08-19 | Discharge: 2023-08-19 | Disposition: A | Discharge status: Home / Self Care | Source: Ambulatory Visit | Attending: Cardiovascular Disease | Admitting: Cardiovascular Disease

## 2023-08-19 DIAGNOSIS — I5022 Chronic systolic (congestive) heart failure: Secondary | ICD-10-CM | POA: Diagnosis not present

## 2023-08-19 LAB — ECHOCARDIOGRAM COMPLETE
P 1/2 time: 641 ms
S' Lateral: 4.1 cm

## 2023-08-19 MED ORDER — PERFLUTREN LIPID MICROSPHERE
1.0000 mL | INTRAVENOUS | Status: AC | PRN
  Administered 2023-08-19: 2 mL via INTRAVENOUS

## 2023-08-20 ENCOUNTER — Ambulatory Visit: Payer: Self-pay | Admitting: Physician Assistant

## 2023-08-20 ENCOUNTER — Encounter (HOSPITAL_COMMUNITY)
Admission: RE | Admit: 2023-08-20 | Discharge: 2023-08-20 | Disposition: A | Discharge status: Home / Self Care | Source: Ambulatory Visit | Attending: Cardiovascular Disease | Admitting: Cardiovascular Disease

## 2023-08-20 DIAGNOSIS — I5022 Chronic systolic (congestive) heart failure: Secondary | ICD-10-CM

## 2023-08-20 DIAGNOSIS — I712 Thoracic aortic aneurysm, without rupture, unspecified: Secondary | ICD-10-CM

## 2023-08-20 DIAGNOSIS — I7121 Aneurysm of the ascending aorta, without rupture: Secondary | ICD-10-CM

## 2023-08-20 DIAGNOSIS — I5042 Chronic combined systolic (congestive) and diastolic (congestive) heart failure: Secondary | ICD-10-CM | POA: Diagnosis not present

## 2023-08-20 NOTE — Telephone Encounter (Signed)
 The patient has been notified of the result and verbalized understanding.  All questions (if any) were answered.  Pt aware we will order for him to get a CT ANGIO CHEST AORTA done, for he is due for this now to assess his aorta.   Pt aware I will place the order in the system and send a message to our scheduling dept to call him back to arrange this appt.   Pt verbalized understanding and agrees with this plan.  Will forward a copy of this report to the pts PCP on file.  Order for BMET placed as well and released in the system, incase CT Dept is requiring an updated BMET on this pt prior to scheduled CT ANGIO AORTA.

## 2023-08-20 NOTE — Telephone Encounter (Signed)
 Pt returning call

## 2023-08-20 NOTE — Progress Notes (Signed)
 Daily Session Note  Patient Details  Name: Dominic Kane MRN: [id number] Date of Birth: 01-22-1941 Referring Provider:   Gattis Kass Pulmonary Rehab Walk Test from 06/10/2023 in [facility] for Heart, Vascular, & Lung Health  Referring Provider Dr. 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 No    PAD/SET Patient? No      Pain Assessment   Currently in Pain? No/denies    Pain Score 0-No pain    Multiple Pain Sites No             Capillary Blood Glucose: No results found for this or any previous visit (from the past 24 hours).    Social History   Tobacco Use  Smoking Status Former   Current packs/day: 0.00   Types: Cigarettes   Quit date: 07/03/1980   Years since quitting: 43.1  Smokeless Tobacco Never    Goals Met:  Proper associated with RPD/PD & O2 Sat Exercise tolerated well No report of concerns or symptoms today Strength training completed today  Goals Unmet:  Not Applicable  Comments: Service time is from 1255 to 1444.    Dr. Genetta Kenning is Medical Director for Pulmonary Rehab at [facility]

## 2023-08-20 NOTE — Telephone Encounter (Signed)
-----   Message from [place] sent at 08/20/2023  9:03 AM EDT ----- EF unchanged, 35-40. Mod PI. Asc Aorta 45 mm. Results sent to Lavelle Posey Sabey via [employer].   PLAN: - Patient is past due for follow-up chest aorta CTA (see result note from 07/2022).  Please arrange and order under Tessa's name. Dx ascending aortic aneurysm. - Copy sent to PCP and Dr. Abel Hoe as Arlie Lain.   I have reviewed your results for Tessa while she is out of the office. Your echocardiogram continues to demonstrate reduced ejection fraction (heart function).  There has been no change since your last echocardiogram in 2024.  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  The size is somewhat larger compared to the CT done last year.  You are due for a follow-up CT.  We will make sure that this is arranged.  Continue current medications and keep follow-up with Dr. Abel Hoe next month as planned. Marlyse Single, PA-C    08/20/2023 8:39 AM

## 2023-08-25 ENCOUNTER — Encounter (HOSPITAL_COMMUNITY)
Admission: RE | Admit: 2023-08-25 | Discharge: 2023-08-25 | Disposition: A | Discharge status: Home / Self Care | Source: Ambulatory Visit | Attending: Cardiovascular Disease | Admitting: Cardiovascular Disease

## 2023-08-25 DIAGNOSIS — I5042 Chronic combined systolic (congestive) and diastolic (congestive) heart failure: Secondary | ICD-10-CM | POA: Diagnosis not present

## 2023-08-25 NOTE — Progress Notes (Signed)
 Daily Session Note  Patient Details  Name: Dominic Kane MRN: [id number] Date of Birth: [birth year] Referring Provider:   Gattis Kass Pulmonary Rehab Walk Test from 06/10/2023 in [facility] for Heart, Vascular, & Lung Health  Referring Provider Dr. Antoinette Batman       Encounter Date: 08/25/2023  Check In:  Session Check In - 08/25/23 1331       Check-In   Supervising physician immediately available to respond to emergencies [facility] immediately available    Physician(s) Koren Persons, NP    [facility]    Staff Present Sueellen Emery BS, ACSM-CEP, Exercise Physiologist;Kaylee Nolon Baxter, MS, ACSM-CEP, Exercise Physiologist;Ernan Runkles Dickson Founds, RN, BSN;Johnny Porter, MS, Exercise Physiologist    Virtual Visit No    Medication changes reported     No    Fall or balance concerns reported    Yes    Comments 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 No    PAD/SET Patient? No      Pain Assessment   Currently in Pain? No/denies    Pain Score 0-No pain    Multiple Pain Sites No             Capillary Blood Glucose: No results found for this or any previous visit (from the past 24 hours).    Social History   Tobacco Use  Smoking Status Former   Current packs/day: 0.00   Types: Cigarettes   Quit date: 07/03/1980   Years since quitting: 43.1  Smokeless Tobacco Never    Goals Met:  Proper associated with RPD/PD & O2 Sat Independence with exercise equipment Exercise tolerated well No report of concerns or symptoms today Strength training completed today  Goals Unmet:  Not Applicable  Comments: Service time is from 1307 to 1441.    Dr. Genetta Kenning is Medical Director for Pulmonary Rehab at [facility].

## 2023-08-27 ENCOUNTER — Encounter (HOSPITAL_COMMUNITY)
Admission: RE | Admit: 2023-08-27 | Discharge: 2023-08-27 | Disposition: A | Discharge status: Home / Self Care | Source: Ambulatory Visit | Attending: Cardiovascular Disease | Admitting: Cardiovascular Disease

## 2023-08-27 DIAGNOSIS — I5042 Chronic combined systolic (congestive) and diastolic (congestive) heart failure: Secondary | ICD-10-CM

## 2023-08-27 NOTE — Progress Notes (Signed)
 Daily Session Note  Patient Details  Name: Dominic Kane MRN: [id number] Date of Birth: 27-Jan-1941 Referring Provider:   Gattis Kass Pulmonary Rehab Walk Test from 06/10/2023 in [facility] for Heart, Vascular, & Lung Health  Referring Provider Dr. 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 No    PAD/SET Patient? No      Pain Assessment   Currently in Pain? No/denies    Multiple Pain Sites No             Capillary Blood Glucose: No results found for this or any previous visit (from the past 24 hours).    Social History   Tobacco Use  Smoking Status Former   Current packs/day: 0.00   Types: Cigarettes   Quit date: 07/03/1980   Years since quitting: 43.1  Smokeless Tobacco Never    Goals Met:  Proper associated with RPD/PD & O2 Sat Independence with exercise equipment Exercise tolerated well No report of concerns or symptoms today Strength training completed today  Goals Unmet:  Not Applicable  Comments: Service time is from 1301 to 1440.    Dr. Genetta Kenning is Medical Director for Pulmonary Rehab at [facility].

## 2023-08-31 DIAGNOSIS — I5022 Chronic systolic (congestive) heart failure: Secondary | ICD-10-CM | POA: Diagnosis not present

## 2023-08-31 DIAGNOSIS — I7121 Aneurysm of the ascending aorta, without rupture: Secondary | ICD-10-CM | POA: Diagnosis not present

## 2023-08-31 DIAGNOSIS — I712 Thoracic aortic aneurysm, without rupture, unspecified: Secondary | ICD-10-CM | POA: Diagnosis not present

## 2023-09-01 ENCOUNTER — Encounter (HOSPITAL_COMMUNITY)
Admission: RE | Admit: 2023-09-01 | Discharge: 2023-09-01 | Disposition: A | Discharge status: Home / Self Care | Source: Ambulatory Visit | Attending: Cardiovascular Disease | Admitting: Cardiovascular Disease

## 2023-09-01 VITALS — Wt 213.0 lb

## 2023-09-01 DIAGNOSIS — I5042 Chronic combined systolic (congestive) and diastolic (congestive) heart failure: Secondary | ICD-10-CM

## 2023-09-01 LAB — BASIC METABOLIC PANEL WITH GFR
BUN/Creatinine Ratio: 21 (ref 10–24)
BUN: 26 mg/dL (ref 8–27)
CO2: 20 mmol/L (ref 20–29)
Calcium: 9.5 mg/dL (ref 8.6–10.2)
Chloride: 103 mmol/L (ref 96–106)
Creatinine, Ser: 1.23 mg/dL (ref 0.76–1.27)
Glucose: 101 mg/dL — ABNORMAL HIGH (ref 70–99)
Potassium: 5.1 mmol/L (ref 3.5–5.2)
Sodium: 140 mmol/L (ref 134–144)
eGFR: 59 mL/min/{1.73_m2} — ABNORMAL LOW (ref >59)

## 2023-09-01 NOTE — Progress Notes (Signed)
 Home Exercise Prescription I have reviewed a Home Exercise Prescription with Lavelle Posey Simms. He is currently exercising on the bike and treadmill, 15 min each, x2 nonrehab days a week. Congratulated pt on meeting goals and encouraged him to continue. Gave goals to increase workload as able with time. The patient stated that their goals were to maintain/increase functional ability, which he is doing. We reviewed exercise guidelines, target heart rate during exercise, RPE Scale, weather conditions, endpoints for exercise, warmup and cool down. The patient is encouraged to come to me with any questions. I will continue to follow up with the patient to assist them with progression and safety.  Spent 15 min discussing home exercise plan and goals.  Michaelangelo Mittelman [place], [place], ACSM-CEP 09/01/2023 3:25 PM

## 2023-09-01 NOTE — Progress Notes (Signed)
 Daily Session Note  Patient Details  Name: Dominic Kane MRN: [id number] Date of Birth: November 20, 1940 Referring Provider:   Gattis Kass Pulmonary Rehab Walk Test from 06/10/2023 in [facility] for Heart, Vascular, & Lung Health  Referring Provider Dr. Antoinette Batman       Encounter Date: 09/01/2023  Check In:  Session Check In - 09/01/23 1520       Check-In   Supervising physician immediately available to respond to emergencies [facility] MD immediately available    Physician(s) Palmer Bobo, NP    [facility]    Staff Present Sueellen Emery BS, ACSM-CEP, Exercise Physiologist;Kaylee Nolon Baxter, MS, ACSM-CEP, Exercise Physiologist;Casey Felipe Horton, [employer], RN, BSN;Johnny Porter, MS, Exercise Physiologist    Virtual Visit No    Medication changes reported     No    Comments Reviewed med list    Fall or balance concerns reported    Yes    Comments 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 No    PAD/SET Patient? No      Pain Assessment   Currently in Pain? No/denies    Pain Score 0-No pain    Multiple Pain Sites No             Capillary Blood Glucose: Results for orders placed or performed in visit on 08/20/23 (from the past 24 hours)  Basic metabolic panel with GFR     Status: Abnormal   Collection Time: 08/31/23  3:38 PM  Result Value Ref Range   Glucose 101 (H) 70 - 99 mg/dL   BUN 26 8 - 27 mg/dL   Creatinine, Ser 4.09 0.76 - 1.27 mg/dL   eGFR 59 (L) >81 XB/JYN/8.29   BUN/Creatinine Ratio 21 10 - 24   Sodium 140 134 - 144 mmol/L   Potassium 5.1 3.5 - 5.2 mmol/L   Chloride 103 96 - 106 mmol/L   CO2 20 20 - 29 mmol/L   Calcium  9.5 8.6 - 10.2 mg/dL   Narrative   Performed at:  [id number] [facility]  [address] [place], [place]   [id number] Lab Director: Pearlean Botts MD, Phone:  [phone]     Exercise Prescription Changes - 09/01/23 1500       Response to Exercise   Blood Pressure (Admit) 124/70    Blood Pressure (Exercise) 120/60    Blood Pressure (Exit) 104/60    Heart Rate (Admit) 73 bpm    Heart Rate (Exercise) 116 bpm    Heart Rate (Exit) 93 bpm    Oxygen Saturation (Admit) 92 %    Oxygen Saturation (Exercise) 92 %    Oxygen Saturation (Exit) 91 %    Rating of Perceived Exertion (Exercise) 9    Perceived Dyspnea (Exercise) 0    Duration Continue with 30 min of aerobic exercise without signs/symptoms of physical distress.    Intensity THRR unchanged      Resistance Training   Training Prescription Yes    Weight black bands    Reps 10-15    Time 10 Minutes             Social History   Tobacco Use  Smoking Status Former   Current packs/day: 0.00   Types: Cigarettes   Quit date: 07/03/1980   Years  since quitting: 43.1  Smokeless Tobacco Never    Goals Met:  Personal goals reviewed No report of concerns or symptoms today Strength training completed today  Goals Unmet:  Not Applicable  Comments: Service time is from 1400 to 1442    Dr. Genetta Kenning is Medical Director for Pulmonary Rehab at [facility].

## 2023-09-02 ENCOUNTER — Ambulatory Visit (HOSPITAL_COMMUNITY)
Admission: RE | Admit: 2023-09-02 | Discharge: 2023-09-02 | Disposition: A | Discharge status: Home / Self Care | Source: Ambulatory Visit | Attending: Physician Assistant | Admitting: Physician Assistant

## 2023-09-02 DIAGNOSIS — I712 Thoracic aortic aneurysm, without rupture, unspecified: Secondary | ICD-10-CM

## 2023-09-02 DIAGNOSIS — I2510 Atherosclerotic heart disease of native coronary artery without angina pectoris: Secondary | ICD-10-CM | POA: Diagnosis not present

## 2023-09-02 DIAGNOSIS — I7121 Aneurysm of the ascending aorta, without rupture: Secondary | ICD-10-CM | POA: Diagnosis not present

## 2023-09-02 DIAGNOSIS — I700 Atherosclerosis of aorta: Secondary | ICD-10-CM | POA: Diagnosis not present

## 2023-09-02 DIAGNOSIS — K5730 Diverticulosis of large intestine without perforation or abscess without bleeding: Secondary | ICD-10-CM | POA: Diagnosis not present

## 2023-09-02 MED ORDER — IOHEXOL 350 MG/ML SOLN
75.0000 mL | Freq: Once | INTRAVENOUS | Status: AC | PRN
  Administered 2023-09-02: 75 mL via INTRAVENOUS

## 2023-09-02 NOTE — Progress Notes (Signed)
 Pulmonary Individual Treatment Plan  Patient Details  Name: Dominic Kane MRN: [id number] Date of Birth: 10-May-1940 Referring Provider:   Gattis Kass Pulmonary Rehab Walk Test from 06/10/2023 in [facility]  Referring Provider Dr. Antoinette Batman       Initial Encounter Date:  Flowsheet Row Pulmonary Rehab Walk Test from 06/10/2023 in [facility], [facility]  Date 06/10/23       Visit Diagnosis: Heart failure, systolic and diastolic, chronic (HCC)  Patient's Home Medications on Admission:   Current Outpatient Medications:    Accu-Chek Softclix Lancets lancets, Use to check blood sugar once daily, Disp: 100 each, Rfl: 3   acetaminophen  (TYLENOL ) 500 MG tablet, Take 1,000 mg by mouth every 8 (eight) hours as needed for moderate pain (pain score 4-6)., Disp: , Rfl:    aspirin  EC 81 MG tablet, Take 1 tablet (81 mg total) by mouth daily. Swallow whole., Disp: 30 tablet, Rfl: 12   Chlorpheniramine-DM (CORICIDIN HBP COUGH/COLD PO), Take 1 tablet by mouth daily as needed (congestion)., Disp: , Rfl:    Cholecalciferol (VITAMIN D -3) 125 MCG (5000 UT) TABS, Take 5,000 Units by mouth at bedtime., Disp: , Rfl:    ciprofloxacin (CILOXAN) 0.3 % ophthalmic solution, Place 2 drops into both eyes as needed (For Eye Injections)., Disp: , Rfl:    ferrous sulfate  325 (65 FE) MG EC tablet, 1 tablet with food every other day, Disp: 100 tablet, Rfl: 3   finasteride  (PROSCAR ) 5 MG tablet, Take 1 tablet (5 mg total) by mouth daily., Disp: 90 tablet, Rfl: 3   fluticasone (FLONASE) 50 MCG/ACT nasal spray, Place 1 spray into both nostrils daily as needed for allergies or rhinitis., Disp: , Rfl:    furosemide  (LASIX ) 40 MG tablet, Take 1 tablet (40 mg total) by mouth daily., Disp: 90 tablet, Rfl: 1   guaiFENesin  (MUCINEX ) 600 MG 12 hr tablet, Take 600 mg by mouth 2 (two) times daily as needed for  cough., Disp: , Rfl:    Lancets (ONETOUCH DELICA PLUS LANCET33G) MISC, , Disp: , Rfl:    levothyroxine  (SYNTHROID ) 175 MCG tablet, Take 1 tablet (175 mcg total) by mouth daily., Disp: 30 tablet, Rfl: 11   Melatonin 5 MG CAPS, Take 5 mg by mouth at bedtime., Disp: , Rfl:    Multiple Vitamins-Minerals (PRESERVISION AREDS 2 PO), Take 1 tablet by mouth in the morning and at bedtime., Disp: , Rfl:    nitroGLYCERIN  (NITROSTAT ) 0.4 MG SL tablet, Place 1 tablet (0.4 mg total) under the tongue every 5 (five) minutes as needed for chest pain., Disp: 25 tablet, Rfl: 3   omeprazole  (PRILOSEC) 20 MG capsule, Take 1 capsule (20 mg total) by mouth daily., Disp: 90 capsule, Rfl: 3   Phenylephrine -DM-GG-APAP (TYLENOL  COLD/FLU SEVERE PO), Take 2 tablets by mouth daily as needed (cold symptoms/congestion). For High Blood Pressure, Disp: , Rfl:    polyethylene glycol (MIRALAX  / GLYCOLAX ) 17 g packet, Take 17 g by mouth daily., Disp: , Rfl:    PRESCRIPTION MEDICATION, every 6 (six) months. 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    Displacement of lumbar intervertebral disc without myelopathy    Diverticulosis of colon (without mention of hemorrhage)    Dysfunction of eustachian tube    Hard of hearingbilateral hearing aids   Enlarged prostate    GERD (gastroesophageal reflux disease)    Headache(784.0)    History of kidney stones    past hx. 15 yrs ago x1   Hypertrophy of prostate without urinary obstruction and other lower urinary tract symptoms (LUTS)    Lipoprotein deficiencies    Other and unspecified hyperlipidemia    Other primary cardiomyopathies    Pain in joint, lower leg    Personal history of colonic polyps    Personal history of other diseases of digestive system    Pes anserinus tendinitis or bursitis    left shoulder remains an issue   Sinoatrial node dysfunction (HCC)    Dr. 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    Thoracic or lumbosacral neuritis or radiculitis, unspecified    Unspecified disorder of skin and subcutaneous tissue    Unspecified essential hypertension    Unspecified vitamin D  deficiency     Tobacco Use: Social History   Tobacco Use  Smoking Status Former   Current packs/day: 0.00   Types: Cigarettes   Quit date: 07/03/1980   Years since quitting: 43.1  Smokeless Tobacco Never    Labs: Review Flowsheet  More data exists      Latest Ref Rng & Units 07/22/2021 10/22/2021 08/14/2022 04/14/2023 05/01/2023  Labs for ITP Cardiac and Pulmonary Rehab  Cholestrol 0 - 200 mg/dL - 161  - 096  -  LDL (calc) 0 - 99 mg/dL - 72  - 69  -  HDL-C >04.54 mg/dL - 09.81  - 19.14  -  Trlycerides 0.0 - 149.0 mg/dL - 78.2  - 95.6  -  Hemoglobin A1c 4.6 - 6.5 % 6.1  6.8  6.8  7.1  -  PH, Arterial 7.35 - 7.45 - - - - 7.383   PCO2 arterial 32 - 48 mmHg - - - - 37.2   Bicarbonate 20.0 - 28.0 mmol/L - - - - 24.1  21.4  22.1   TCO2 22 - 32 mmol/L - - - - 25  23  23    Acid-base deficit 0.0 - 2.0 mmol/L - - - - 1.0  5.0  3.0   O2 Saturation % - - - - 65  68  95      Details       Multiple values from one day are sorted in reverse-chronological order         Capillary Blood Glucose: Lab Results  Component Value Date   GLUCAP 109 (H) 05/01/2023   GLUCAP 85 03/09/2022   GLUCAP 120 (H) 03/08/2022   GLUCAP 136 (H)  03/08/2022   GLUCAP 89 03/08/2022    POCT Glucose     Row Name 06/10/23 1041             POCT Blood Glucose   Pre-Exercise 127 mg/dL                Pulmonary Assessment Scores:  Pulmonary Assessment Scores     Row Name 06/10/23 1226 08/28/23 0709 09/01/23 1530     ADL UCSD   ADL Phase Entry Exit Exit   SOB Score total 44 7 --     CAT Score   CAT Score 12 9 --     mMRC Score   mMRC Score 2 -- 0           UCSD: Self-administered rating of dyspnea associated with activities of daily living (ADLs) 6-point scale (0 = not at all to 5 = maximal or unable to do because of breathlessness)  Scoring Scores range from 0 to 120.  Minimally important difference is 5 units  CAT: CAT can identify the health impairment of COPD patients and is better correlated with disease progression.  CAT has a scoring range of zero to 40. The CAT score is classified into four groups of low (less than 10), medium (10 - 20), high (21-30) and very high (31-40) based on the impact level of disease on health status. A CAT score over 10 suggests significant symptoms.  A worsening CAT score could be explained by an exacerbation, poor medication adherence, poor inhaler technique, or progression of COPD or comorbid conditions.  CAT MCID is 2 points  mMRC: mMRC (Modified Medical Research Council) Dyspnea Scale is used to assess the degree of baseline functional disability in patients of respiratory disease due to dyspnea. No minimal important difference is established. A decrease in score of 1 point or greater is considered a positive change.   Pulmonary Function Assessment:  Pulmonary Function Assessment - 06/10/23 1226       Breath    Bilateral Breath Sounds Wheezes;Expiratory;Inspiratory    Shortness of Breath Yes;Limiting activity;Fear of Shortness of Breath             Exercise Target Goals: Exercise Program Goal: Individual exercise prescription set using results from initial 6 min walk test and THRR while considering  patient's activity barriers and safety.   Exercise Prescription Goal: Initial exercise prescription builds to 30-45 minutes a day of aerobic activity, 2-3 days per week.  Home exercise guidelines will be given to patient during program as part of exercise prescription that the participant will acknowledge.  Activity Barriers & Risk Stratification:  Activity Barriers & Cardiac Risk Stratification - 06/10/23 1037       Activity Barriers & Cardiac Risk Stratification   Activity Barriers Muscular Weakness;Shortness of Breath;Deconditioning             6 Minute Walk:  6 Minute Walk     Row Name 06/10/23 1214 09/01/23 1526       6 Minute Walk   Phase Initial Discharge    Distance 1140 feet 1510 feet    Distance % Change -- 32.46 %    Distance Feet Change -- 370 ft    Walk Time 6 minutes 6 minutes    # of Rest Breaks 0 0    MPH 2.16 2.86    METS 1.59 2.66    RPE 9 9    Perceived Dyspnea  0 0    VO2 Peak 5.57 9.32  Symptoms No No    Resting HR 67 bpm 73 bpm    Resting BP 106/66 126/70    Resting Oxygen Saturation  97 % 92 %    Exercise Oxygen Saturation  during 6 min walk 89 % 95 %    Max Ex. HR 74 bpm 116 bpm    Max Ex. BP 126/60 120/60    2 Minute Post BP 112/62 102/60      Interval HR   1 Minute HR 74 86    2 Minute HR 74 100    3 Minute HR 70 116    4 Minute HR 71 108    5 Minute HR 73 112    6 Minute HR 73 112    2 Minute Post HR 63 90    Interval Heart Rate? Yes --      Interval Oxygen   Interval Oxygen? Yes --    Baseline Oxygen Saturation % 97 % 92 %    1 Minute Oxygen Saturation % 93 % 94 %    1 Minute Liters of Oxygen 0 L 0 L    2 Minute Oxygen  Saturation % 92 % 94 %    2 Minute Liters of Oxygen 0 L 0 L    3 Minute Oxygen Saturation % 91 % 95 %    3 Minute Liters of Oxygen 0 L 0 L    4 Minute Oxygen Saturation % 89 % 94 %    4 Minute Liters of Oxygen 0 L 0 L    5 Minute Oxygen Saturation % 91 % 94 %    5 Minute Liters of Oxygen 0 L 0 L    6 Minute Oxygen Saturation % 90 % 95 %    6 Minute Liters of Oxygen 0 L 0 L    2 Minute Post Oxygen Saturation % 98 % 95 %    2 Minute Post Liters of Oxygen 0 L 0 L             Oxygen Initial Assessment:  Oxygen Initial Assessment - 06/10/23 1051       Home Oxygen   Home Oxygen Device None    Sleep Oxygen Prescription None    Home Exercise Oxygen Prescription None    Home Resting Oxygen Prescription None      Initial 6 min Walk   Oxygen Used None      Program Oxygen Prescription   Program Oxygen Prescription None      Intervention   Short Term Goals To learn and understand importance of maintaining oxygen saturations>88%;To learn and understand importance of monitoring SPO2 with pulse oximeter and demonstrate accurate use of the pulse oximeter.;To learn and demonstrate proper pursed lip breathing techniques or other breathing techniques.     Long  Term Goals Exhibits proper breathing techniques, such as pursed lip breathing or other method taught during program session;Verbalizes importance of monitoring SPO2 with pulse oximeter and return demonstration;Maintenance of O2 saturations>88%             Oxygen Re-Evaluation:  Oxygen Re-Evaluation     Row Name 07/03/23 0726 07/30/23 0914 08/31/23 0738         Program Oxygen Prescription   Program Oxygen Prescription None None None       Home Oxygen   Home Oxygen Device None None None     Sleep Oxygen Prescription None None None     Home Exercise Oxygen Prescription None None None  Home Resting Oxygen Prescription None None None       Goals/Expected Outcomes   Short Term Goals To learn and understand importance of  maintaining oxygen saturations>88%;To learn and understand importance of monitoring SPO2 with pulse oximeter and demonstrate accurate use of the pulse oximeter.;To learn and demonstrate proper pursed lip breathing techniques or other breathing techniques.  To learn and understand importance of maintaining oxygen saturations>88%;To learn and understand importance of monitoring SPO2 with pulse oximeter and demonstrate accurate use of the pulse oximeter.;To learn and demonstrate proper pursed lip breathing techniques or other breathing techniques.  To learn and understand importance of maintaining oxygen saturations>88%;To learn and understand importance of monitoring SPO2 with pulse oximeter and demonstrate accurate use of the pulse oximeter.;To learn and demonstrate proper pursed lip breathing techniques or other breathing techniques.      Long  Term Goals Exhibits proper breathing techniques, such as pursed lip breathing or other method taught during program session;Verbalizes importance of monitoring SPO2 with pulse oximeter and return demonstration;Maintenance of O2 saturations>88% Exhibits proper breathing techniques, such as pursed lip breathing or other method taught during program session;Verbalizes importance of monitoring SPO2 with pulse oximeter and return demonstration;Maintenance of O2 saturations>88% Exhibits proper breathing techniques, such as pursed lip breathing or other method taught during program session;Verbalizes importance of monitoring SPO2 with pulse oximeter and return demonstration;Maintenance of O2 saturations>88%     Goals/Expected Outcomes Compliance and understanding of oxygen saturation monitoring and breath techniques to decrease shortness of breath. Compliance and understanding of oxygen saturation monitoring and breath techniques to decrease shortness of breath. Compliance and understanding of oxygen saturation monitoring and breath techniques to decrease shortness of breath.               Oxygen Discharge (Final Oxygen Re-Evaluation):  Oxygen Re-Evaluation - 08/31/23 0738       Program Oxygen Prescription   Program Oxygen Prescription None      Home Oxygen   Home Oxygen Device None    Sleep Oxygen Prescription None    Home Exercise Oxygen Prescription None    Home Resting Oxygen Prescription None      Goals/Expected Outcomes   Short Term Goals To learn and understand importance of maintaining oxygen saturations>88%;To learn and understand importance of monitoring SPO2 with pulse oximeter and demonstrate accurate use of the pulse oximeter.;To learn and demonstrate proper pursed lip breathing techniques or other breathing techniques.     Long  Term Goals Exhibits proper breathing techniques, such as pursed lip breathing or other method taught during program session;Verbalizes importance of monitoring SPO2 with pulse oximeter and return demonstration;Maintenance of O2 saturations>88%    Goals/Expected Outcomes Compliance and understanding of oxygen saturation monitoring and breath techniques to decrease shortness of breath.             Initial Exercise Prescription:  Initial Exercise Prescription - 06/10/23 1300       Date of Initial Exercise RX and Referring Provider   Date 06/10/23    Referring Provider Dr. Antoinette Batman    Expected Discharge Date 09/03/23      Recumbant Elliptical   Level 1    Minutes 15    METs 1.5      Track   Laps 5    Minutes 15    METs 1.77      Prescription Details   Frequency (times per week) 2    Duration Progress to 30 minutes of continuous aerobic without signs/symptoms of physical distress  Intensity   THRR 40-80% of Max Heartrate 55-110    Ratings of Perceived Exertion 11-13    Perceived Dyspnea 0-4      Progression   Progression Continue to progress workloads to maintain intensity without signs/symptoms of physical distress.      Resistance Training   Training Prescription Yes     Weight blue bands    Reps 10-15             Perform Capillary Blood Glucose checks as needed.  Exercise Prescription Changes:   Exercise Prescription Changes     Row Name 06/23/23 1500 07/07/23 1500 07/21/23 1500 07/30/23 1149 08/18/23 1500     Response to Exercise   Blood Pressure (Admit) 98/60 108/62 106/50 92/58 100/62   Blood Pressure (Exercise) 130/60 142/64 130/70 -- 126/64   Blood Pressure (Exit) 96/62 101/58 100/60 98/60 112/60   Heart Rate (Admit) 65 bpm 63 bpm 70 bpm 76 bpm 61 bpm   Heart Rate (Exercise) 97 bpm 101 bpm 102 bpm 115 bpm 117 bpm   Heart Rate (Exit) 81 bpm 75 bpm 79 bpm 91 bpm 89 bpm   Oxygen Saturation (Admit) 93 % 96 % 92 % 93 % 96 %   Oxygen Saturation (Exercise) 94 % 92 % 92 % 93 % 93 %   Oxygen Saturation (Exit) 92 % 92 % 92 % 91 % 91 %   Rating of Perceived Exertion (Exercise) 11 11 11 11 11    Perceived Dyspnea (Exercise) 1 1 1 1 1    Duration Continue with 30 min of aerobic exercise without signs/symptoms of physical distress. Continue with 30 min of aerobic exercise without signs/symptoms of physical distress. Continue with 30 min of aerobic exercise without signs/symptoms of physical distress. Continue with 30 min of aerobic exercise without signs/symptoms of physical distress. Continue with 30 min of aerobic exercise without signs/symptoms of physical distress.   Intensity THRR unchanged THRR unchanged THRR unchanged THRR unchanged THRR unchanged     Progression   Progression Continue to progress workloads to maintain intensity without signs/symptoms of physical distress. Continue to progress workloads to maintain intensity without signs/symptoms of physical distress. Continue to progress workloads to maintain intensity without signs/symptoms of physical distress. Continue to progress workloads to maintain intensity without signs/symptoms of physical distress. --   Average METs -- -- -- 3.8 --     Resistance Training   Training Prescription Yes Yes  Yes Yes Yes   Weight blue bands blue bands blue bands black bands black bands   Reps 10-15 10-15 10-15 10-15 10-15   Time 10 Minutes 10 Minutes 10 Minutes 10 Minutes 10 Minutes     Treadmill   MPH -- 2 2.2 2.5 2.7   Grade -- 0.5 1.5 2 3    Minutes -- 15 15 15 15    METs -- 2.4 2.8 3.4 4     Recumbant Elliptical   Level 3 4 5 4 4    RPM 74 -- 61 60 70   Watts 103 -- 87 -- 110   Minutes 15 15 15 15 15    METs 3.1 3.2 3.4 3.8 4.4     Track   Laps 20  Magazine features editor Track) -- -- -- --   Minutes 15 -- -- -- --   METs 3.55 -- -- -- --    Row Name 09/01/23 1500             Response to Exercise   Blood Pressure (Admit) 124/70  Blood Pressure (Exercise) 120/60       Blood Pressure (Exit) 104/60       Heart Rate (Admit) 73 bpm       Heart Rate (Exercise) 116 bpm       Heart Rate (Exit) 93 bpm       Oxygen Saturation (Admit) 92 %       Oxygen Saturation (Exercise) 92 %       Oxygen Saturation (Exit) 91 %       Rating of Perceived Exertion (Exercise) 9       Perceived Dyspnea (Exercise) 0       Duration Continue with 30 min of aerobic exercise without signs/symptoms of physical distress.       Intensity THRR unchanged         Resistance Training   Training Prescription Yes       Weight black bands       Reps 10-15       Time 10 Minutes         Home Exercise Plan   Plans to continue exercise at [facility] (comment)       Frequency --  n/a, goal met       Initial Home Exercises Provided 09/01/23                Exercise Comments:   Exercise Comments     Row Name 06/16/23 1531 09/01/23 1521         Exercise Comments Pt completed first day of group exercise. He exercised on the recumbent elliptical for 15 min, level 1, METs 2.7. He then walked on the track for 15 min, METs 2.54. Tolerated well. Needed demonstrative cues for warm up and cool down. Performed squats. Discussed METs briefly, will review. Discussed with pt home exercise plan. He is currently  exercising on the bike and treadmill, 15 min each, x2 nonrehab days a week. Congratulated pt on meeting goals and encouraged him to continue. Gave goals to increase workload as able with time.               Exercise Goals and Review:   Exercise Goals     Row Name 06/10/23 1050             Exercise Goals   Increase Physical Activity Yes       Intervention Provide advice, education, support and counseling about physical activity/exercise needs.;Develop an individualized exercise prescription for aerobic and resistive training based on initial evaluation findings, risk stratification, comorbidities and participant's personal goals.       Expected Outcomes Short Term: Attend rehab on a regular basis to increase amount of physical activity.;Long Term: Exercising regularly at least 3-5 days a week.;Long Term: Add in home exercise to make exercise part of routine and to increase amount of physical activity.       Increase Strength and Stamina Yes       Intervention Provide advice, education, support and counseling about physical activity/exercise needs.;Develop an individualized exercise prescription for aerobic and resistive training based on initial evaluation findings, risk stratification, comorbidities and participant's personal goals.       Expected Outcomes Short Term: Increase workloads from initial exercise prescription for resistance, speed, and METs.;Short Term: Perform resistance training exercises routinely during rehab and add in resistance training at home;Long Term: Improve cardiorespiratory fitness, muscular endurance and strength as measured by increased METs and functional capacity ( )       Able to understand and use rate of  perceived exertion (RPE) scale Yes       Intervention Provide education and explanation on how to use RPE scale       Expected Outcomes Short Term: Able to use RPE daily in rehab to express subjective intensity level;Long Term:  Able to use RPE to guide  intensity level when exercising independently       Able to understand and use Dyspnea scale Yes       Intervention Provide education and explanation on how to use Dyspnea scale       Expected Outcomes Short Term: Able to use Dyspnea scale daily in rehab to express subjective sense of shortness of breath during exertion;Long Term: Able to use Dyspnea scale to guide intensity level when exercising independently       Knowledge and understanding of Target Heart Rate Range (THRR) Yes       Intervention Provide education and explanation of THRR including how the numbers were predicted and where they are located for reference       Expected Outcomes Short Term: Able to state/look up THRR;Short Term: Able to use daily as guideline for intensity in rehab;Long Term: Able to use THRR to govern intensity when exercising independently       Understanding of Exercise Prescription Yes       Intervention Provide education, explanation, and written materials on patient's individual exercise prescription       Expected Outcomes Short Term: Able to explain program exercise prescription;Long Term: Able to explain home exercise prescription to exercise independently                Exercise Goals Re-Evaluation :  Exercise Goals Re-Evaluation     Row Name 07/03/23 0717 07/30/23 0914 08/31/23 0738         Exercise Goal Re-Evaluation   Exercise Goals Review Increase Physical Activity;Able to understand and use Dyspnea scale;Understanding of Exercise Prescription;Increase Strength and Stamina;Knowledge and understanding of Target Heart Rate Range (THRR);Able to understand and use rate of perceived exertion (RPE) scale Increase Physical Activity;Able to understand and use Dyspnea scale;Understanding of Exercise Prescription;Increase Strength and Stamina;Knowledge and understanding of Target Heart Rate Range (THRR);Able to understand and use rate of perceived exertion (RPE) scale Increase Physical Activity;Able to  understand and use Dyspnea scale;Understanding of Exercise Prescription;Increase Strength and Stamina;Knowledge and understanding of Target Heart Rate Range (THRR);Able to understand and use rate of perceived exertion (RPE) scale     Comments Dominic Kane has completed 6 exercise sessions. He is exercising on the recumbent ellipitical for 15 min, level 3, METs 3.9. He recently has moved from the track to the treadmill, walking 15 min, speed 2.0 mph, 0 incline, METs 2.4. He became dizzy last 2 min therefore will need to evaluate again. He performs warm up and cool down with demonstrative cues at times due to being hard of hearing. His balance is poor. Using black bands, 7.3 lbs. Will progress as tolerated. Dominic Kane has completed 13 exercise sessions. He is exercising on the recumbent ellipitical for 15 min, level 4, METs 4.9. He is also walking on the treadmill 15 min, speed 2.5 mph, 2 incline, METs 3.4. He has progressed substantially in METs recently. His balance has improved. Using black bands, 7.3 lbs. Will progress as tolerated. Dominic Kane has completed 21 exercise sessions. He is exercising on the recumbent ellipitical for 15 min, level 5, METs 5.1. He is also walking on the treadmill 15 min, speed 2.6 mph, 3 incline, METs 4. He attempted the airdyne bike  a couple sessions but did not like the seat. He has made significant progress and is scheduled to graduate this week. Using black bands, 7.3 lbs.     Expected Outcomes Through exercise at rehab and home, the patient will decrease shortness of breath with daily activities and feel confident in carrying out an exercise regimen at home. Through exercise at rehab and home, the patient will decrease shortness of breath with daily activities and feel confident in carrying out an exercise regimen at home. Through exercise at rehab and home, the patient will decrease shortness of breath with daily activities and feel confident in carrying out an exercise regimen at home.               Discharge Exercise Prescription (Final Exercise Prescription Changes):  Exercise Prescription Changes - 09/01/23 1500       Response to Exercise   Blood Pressure (Admit) 124/70    Blood Pressure (Exercise) 120/60    Blood Pressure (Exit) 104/60    Heart Rate (Admit) 73 bpm    Heart Rate (Exercise) 116 bpm    Heart Rate (Exit) 93 bpm    Oxygen Saturation (Admit) 92 %    Oxygen Saturation (Exercise) 92 %    Oxygen Saturation (Exit) 91 %    Rating of Perceived Exertion (Exercise) 9    Perceived Dyspnea (Exercise) 0    Duration Continue with 30 min of aerobic exercise without signs/symptoms of physical distress.    Intensity THRR unchanged      Resistance Training   Training Prescription Yes    Weight black bands    Reps 10-15    Time 10 Minutes      Home Exercise Plan   Plans to continue exercise at [facility] (comment)    Frequency --   n/a, goal met   Initial Home Exercises Provided 09/01/23             Nutrition:  Target Goals: Understanding of nutrition guidelines, daily intake of sodium 1500mg , cholesterol 200mg , calories 30% from fat and 7% or less from saturated fats, daily to have 5 or more servings of fruits and vegetables.  Biometrics:  Pre Biometrics - 06/10/23 1225       Pre Biometrics   Grip Strength 30 kg             Post Biometrics - 09/01/23 1530        Post  Biometrics   Grip Strength 32 kg             Nutrition Therapy Plan and Nutrition Goals:  Nutrition Therapy & Goals - 08/27/23 1429       Nutrition Therapy   Diet Heart Healthy Diet    Drug/Food Interactions Statins/Certain Fruits      Personal Nutrition Goals   Nutrition Goal Patient to improve diet quality by using the plate method as a guide for meal planning to include lean protein/plant protein, fruits, vegetables, whole grains, nonfat dairy as part of a well-balanced diet.   Goal in progress.   Personal Goal #2 Patient to identify strategies for  blood sugar control with goal A1c <7%   goal in progress.   Personal Goal #3 Patient to identify strategies for weight loss with goal 0.5-2.0# per week.   goal in progress.   Comments Goal in progress. Dominic Kane has medical history of CHF, HTN, CAD, hyperlipidemia. He is motivated to lose weight and improve blood sugar control. He reports 10-15# weight gain over the last  year; DM2 has previously been controlled with diet. He is down 9# since starting with our program. He continues to be mindful of food choices and sodium intake. Patient will benefit from participation in pulmonary rehab for nutrition, exercise, and lifestyle modification.      Intervention Plan   Intervention Prescribe, educate and counsel regarding individualized specific dietary modifications aiming towards targeted core components such as weight, hypertension, lipid management, diabetes, heart failure and other comorbidities.;Nutrition handout(s) given to patient.    Expected Outcomes Short Term Goal: Understand basic principles of dietary content, such as calories, fat, sodium, cholesterol and nutrients.;Long Term Goal: Adherence to prescribed nutrition plan.             Nutrition Assessments:  MEDIFICTS Score Key: >=70 Need to make dietary changes  40-70 Heart Healthy Diet <= 40 Therapeutic Level Cholesterol Diet   Picture Your Plate Scores: <95 Unhealthy dietary pattern with much room for improvement. 41-50 Dietary pattern unlikely to meet recommendations for good health and room for improvement. 51-60 More healthful dietary pattern, with some room for improvement.  >60 Healthy dietary pattern, although there may be some specific behaviors that could be improved.    Nutrition Goals Re-Evaluation:  Nutrition Goals Re-Evaluation     Row Name 06/16/23 1434 07/23/23 1344 08/27/23 1429         Goals   Current Weight 222 lb 0.1 oz (100.7 kg) 214 lb 8.1 oz (97.3 kg) 212 lb 15.4 oz (96.6 kg)     Comment A1c 7.1,  lipds WNL, LDL 69 no new labs at this time; most recent labs A1c 7.1, lipds WNL, LDL 69 no new labs at this time; most recent labs A1c 7.1, lipds WNL, LDL 69     Expected Outcome Tegh has medical history of CHF, HTN, CAD, hyperlipidemia. He is motivated to lose weight and improve blood sugar control. He reports 10-15# weight gain over the last year; DM2 has previously been controlled with diet. Patient will benefit from participation in pulmonary rehab for nutrition, exercise, and lifestyle modification. Goal in progress. Dominic Kane has medical history of CHF, HTN, CAD, hyperlipidemia. He is motivated to lose weight and improve blood sugar control. He reports 10-15# weight gain over the last year; DM2 has previously been controlled with diet. He is down 7.5# since starting with our program. Patient will benefit from participation in pulmonary rehab for nutrition, exercise, and lifestyle modification. Goal in progress. Dominic Kane has medical history of CHF, HTN, CAD, hyperlipidemia. He is motivated to lose weight and improve blood sugar control. He reports 10-15# weight gain over the last year; DM2 has previously been controlled with diet. He is down 9# since starting with our program. He continues to be mindful of food choices and sodium intake. Patient will benefit from participation in pulmonary rehab for nutrition, exercise, and lifestyle modification.              Nutrition Goals Discharge (Final Nutrition Goals Re-Evaluation):  Nutrition Goals Re-Evaluation - 08/27/23 1429       Goals   Current Weight 212 lb 15.4 oz (96.6 kg)    Comment no new labs at this time; most recent labs A1c 7.1, lipds WNL, LDL 69    Expected Outcome Goal in progress. Dominic Kane has medical history of CHF, HTN, CAD, hyperlipidemia. He is motivated to lose weight and improve blood sugar control. He reports 10-15# weight gain over the last year; DM2 has previously been controlled with diet. He is down 9# since starting  with our  program. He continues to be mindful of food choices and sodium intake. Patient will benefit from participation in pulmonary rehab for nutrition, exercise, and lifestyle modification.             Psychosocial: Target Goals: Acknowledge presence or absence of significant depression and/or stress, maximize coping skills, provide positive support system. Participant is able to verbalize types and ability to use techniques and skills needed for reducing stress and depression.  Initial Review & Psychosocial Screening:  Initial Psych Review & Screening - 06/10/23 1038       Initial Review   Current issues with Current Sleep Concerns   Pt has an upcoming appt for possible sleep apnea     Family Dynamics   Good Support System? Yes    Comments Good support from wife      Barriers   Psychosocial barriers to participate in program Psychosocial barriers identified (see note)      Screening Interventions   Interventions Encouraged to exercise;Provide feedback about the scores to participant;To provide support and resources with identified psychosocial needs    Expected Outcomes Short Term goal: Utilizing psychosocial counselor, staff and physician to assist with identification of specific Stressors or current issues interfering with healing process. Setting desired goal for each stressor or current issue identified.;Long Term Goal: Stressors or current issues are controlled or eliminated.;Short Term goal: Identification and review with participant of any Quality of Life or Depression concerns found by scoring the questionnaire.;Long Term goal: The participant improves quality of Life and PHQ9 Scores as seen by post scores and/or verbalization of changes             Quality of Life Scores:  Scores of 19 and below usually indicate a poorer quality of life in these areas.  A difference of  2-3 points is a clinically meaningful difference.  A difference of 2-3 points in the total score of the  Quality of Life Index has been associated with significant improvement in overall quality of life, self-image, physical symptoms, and general health in studies assessing change in quality of life.  PHQ-9: Review Flowsheet  More data exists      08/28/2023 06/10/2023 04/29/2023 08/14/2022 04/28/2022  Depression screen PHQ 2/9  Decreased Interest 0 0 0 0 0  Down, Depressed, Hopeless 0 0 0 0 0  PHQ - 2 Score 0 0 0 0 0  Altered sleeping 0 0 - - -  Tired, decreased energy 1 1 - - -  Change in appetite 0 0 - - -  Feeling bad or failure about yourself  0 0 - - -  Trouble concentrating 0 0 - - -  Moving slowly or fidgety/restless 0 0 - - -  Suicidal thoughts 0 0 - - -  PHQ-9 Score 1 1 - - -  Difficult doing work/chores Not difficult at all Somewhat difficult - - -   Interpretation of Total Score  Total Score Depression Severity:  1-4 = Minimal depression, 5-9 = Mild depression, 10-14 = Moderate depression, 15-19 = Moderately severe depression, 20-27 = Severe depression   Psychosocial Evaluation and Intervention:  Psychosocial Evaluation - 06/10/23 1335       Psychosocial Evaluation & Interventions   Interventions Encouraged to exercise with the program and follow exercise prescription    Comments Kashon endorses sleep concerns. Gets up in the middle of the night to use restroom. Is scheduled to see MD about possible sleep apnea.    Expected Outcomes For Tre's  quality of sleep to improve. To see his MD about possible sleep apnea    Continue Psychosocial Services  No Follow up required             Psychosocial Re-Evaluation:  Psychosocial Re-Evaluation     Row Name 07/03/23 0932 07/31/23 1040 08/31/23 1338         Psychosocial Re-Evaluation   Current issues with Current Sleep Concerns Current Sleep Concerns Current Sleep Concerns     Comments Baylin endorses poor sleep quality. He states that he gets up to use the restroom multiple times nightly. Educated pt to speak with MD  about the issues and to cut fluids back before bed. Hansen also stated that he is scheduled to have a sleep study next month. He is currently working with his medical team about his sleep concerns. Isauro denies any other psy/soc barriers or concerns. Psychosocial monthly re-evaluation is as follows: Ike endorses poor sleep quality. He states that he gets up to use the restroom multiple times nightly. He has spoken to his PCP about it and he declined to start any medication at this time. He stated it is his usual routine and he is accustomed to it now. Zavien is still waiting to complete his sleep study. His MD had to re-order the test. Jerrad denies any other psy/soc barriers or concerns and states he has great support at home. Psychosocial monthly re-evaluation is as follows: Jadyn stated he failed his sleep study. He is in the process of getting home oxygen and a Cpap. He is hopeful this will provide more adequate sleep. Woodie denies any other psy/soc barriers or concerns and states he has great support at home.     Expected Outcomes For Colvin to achieve better quality of sleep, to feel rested and less fatigued. For Sammie Crigler to achieve better quality of sleep, to feel rested and less fatigued. For Sammie Crigler to achieve better quality of sleep, to feel rested and less fatigued. To get home oxygen and a cpap set up.     Interventions Encouraged to attend Pulmonary Rehabilitation for the exercise Encouraged to attend Pulmonary Rehabilitation for the exercise Encouraged to attend Pulmonary Rehabilitation for the exercise     Continue Psychosocial Services  Follow up required by staff Follow up required by staff Follow up required by staff              Psychosocial Discharge (Final Psychosocial Re-Evaluation):  Psychosocial Re-Evaluation - 08/31/23 1338       Psychosocial Re-Evaluation   Current issues with Current Sleep Concerns    Comments Psychosocial monthly re-evaluation is as follows:  Dominic Kane stated he failed his sleep study. He is in the process of getting home oxygen and a Cpap. He is hopeful this will provide more adequate sleep. Dominic Kane denies any other psy/soc barriers or concerns and states he has great support at home.    Expected Outcomes For [place] to achieve better quality of sleep, to feel rested and less fatigued. To get home oxygen and a cpap set up.    Interventions Encouraged to attend Pulmonary Rehabilitation for the exercise    Continue Psychosocial Services  Follow up required by staff             Education: Education Goals: Education classes will be provided on a weekly basis, covering required topics. Participant will state understanding/return demonstration of topics presented.  Learning Barriers/Preferences:  Learning Barriers/Preferences - 06/10/23 1054       Learning Barriers/Preferences   Learning  Barriers Hearing    Learning Preferences Group Instruction;Written Material             Education Topics: Know Your Numbers Group instruction that is supported by a PowerPoint presentation. Instructor discusses importance of knowing and understanding resting, exercise, and post-exercise oxygen saturation, heart rate, and blood pressure. Oxygen saturation, heart rate, blood pressure, rating of perceived exertion, and dyspnea are reviewed along with a normal range for these values.  Flowsheet Row PULMONARY REHAB OTHER RESPIRATORY from 06/25/2023 in [facility] for Heart, Vascular, & Lung [facility]  Date 06/25/23  Educator EP  Instruction Review Code 1- Verbalizes Understanding       Exercise for the Pulmonary Patient Group instruction that is supported by a PowerPoint presentation. Instructor discusses benefits of exercise, core components of exercise, frequency, duration, and intensity of an exercise routine, importance of utilizing pulse oximetry during exercise, safety while exercising, and options of places to  exercise outside of rehab.  Flowsheet Row PULMONARY REHAB OTHER RESPIRATORY from 06/18/2023 in [facility] for Heart, Vascular, & Lung Health  Date 06/18/23  Educator EP  Instruction Review Code 1- Verbalizes Understanding       MET Level  Group instruction provided by PowerPoint, verbal discussion, and written material to support subject matter. Instructor reviews what METs are and how to increase METs.  Flowsheet Row PULMONARY REHAB OTHER RESPIRATORY from 08/13/2023 in [facility] for Heart, Vascular, & Lung Health  Date 08/13/23  Educator EP  Instruction Review Code 1- Verbalizes Understanding       Pulmonary Medications Verbally interactive group education provided by instructor with focus on inhaled medications and proper administration.   Anatomy and Physiology of the Respiratory System Group instruction provided by PowerPoint, verbal discussion, and written material to support subject matter. Instructor reviews respiratory cycle and anatomical components of the respiratory system and their functions. Instructor also reviews differences in obstructive and restrictive respiratory diseases with examples of each.  Flowsheet Row PULMONARY REHAB OTHER RESPIRATORY from 08/27/2023 in [facility] for Heart, Vascular, & Lung Health  Date 08/27/23  Educator RT  Instruction Review Code 1- Verbalizes Understanding       Oxygen Safety Group instruction provided by PowerPoint, verbal discussion, and written material to support subject matter. There is an overview of "What is Oxygen" and "Why do we need it".  Instructor also reviews how to create a safe environment for oxygen use, the importance of using oxygen as prescribed, and the risks of noncompliance. There is a brief discussion on traveling with oxygen and resources the patient may utilize. Flowsheet Row PULMONARY REHAB OTHER RESPIRATORY from 07/02/2023 in [facility] for Heart, Vascular, & Lung Health  Date 07/02/23  Educator EP  Instruction Review Code 1- Verbalizes Understanding       Oxygen Use Group instruction provided by PowerPoint, verbal discussion, and written material to discuss how supplemental oxygen is prescribed and different types of oxygen supply systems. Resources for more information are provided.  Flowsheet Row PULMONARY REHAB OTHER RESPIRATORY from 07/09/2023 in [facility] for Heart, Vascular, & Lung Health  Date 07/09/23  Educator RT  Instruction Review Code 1- Verbalizes Understanding       Breathing Techniques Group instruction that is supported by demonstration and informational handouts. Instructor discusses the benefits of pursed lip and diaphragmatic breathing and detailed demonstration on how to perform both.  Flowsheet Row PULMONARY REHAB OTHER  RESPIRATORY from 07/16/2023 in [facility] for Heart, Vascular, & Lung Health  Date 07/16/23  Educator RN  Instruction Review Code 1- Verbalizes Understanding        Risk Factor Reduction Group instruction that is supported by a PowerPoint presentation. Instructor discusses the definition of a risk factor, different risk factors for pulmonary disease, and how the heart and lungs work together. Flowsheet Row PULMONARY REHAB OTHER RESPIRATORY from 08/06/2023 in [facility] for Heart, Vascular, & Lung Health  Date 08/06/23  Educator EP  Instruction Review Code 1- Verbalizes Understanding       Pulmonary Diseases Group instruction provided by PowerPoint, verbal discussion, and written material to support subject matter. Instructor gives an overview of the different type of pulmonary diseases. There is also a discussion on risk factors and symptoms as well as ways to manage the diseases. Flowsheet Row PULMONARY REHAB OTHER RESPIRATORY from 08/20/2023 in [facility] for Heart, Vascular, & Lung Health  Date 08/20/23  Educator RT  Instruction Review Code 1- Verbalizes Understanding       Stress and Energy Conservation Group instruction provided by PowerPoint, verbal discussion, and written material to support subject matter. Instructor gives an overview of stress and the impact it can have on the body. Instructor also reviews ways to reduce stress. There is also a discussion on energy conservation and ways to conserve energy throughout the day. Flowsheet Row PULMONARY REHAB OTHER RESPIRATORY from 07/23/2023 in [facility] for Heart, Vascular, & Lung Health  Date 07/23/23  Educator RN  Instruction Review Code 1- Verbalizes Understanding       Warning Signs and Symptoms Group instruction provided by PowerPoint, verbal discussion, and written material to support subject matter. Instructor reviews warning signs and symptoms of stroke, heart attack, cold and flu. Instructor also reviews ways to prevent the spread of infection. Flowsheet Row PULMONARY REHAB OTHER RESPIRATORY from 07/30/2023 in [facility] for Heart, Vascular, & Lung Health  Date 07/30/23  Educator RN  Instruction Review Code 1- Verbalizes Understanding       Other Education Group or individual verbal, written, or video instructions that support the educational goals of the pulmonary rehab program.    Knowledge Questionnaire Score:  Knowledge Questionnaire Score - 08/28/23 0708       Knowledge Questionnaire Score   Post Score 16/18             Core Components/Risk Factors/Patient Goals at Admission:  Personal Goals and Risk Factors at Admission - 06/10/23 1054       Core Components/Risk Factors/Patient Goals on Admission    Weight Management Weight Loss    Improve shortness of breath with ADL's Yes    Intervention Provide education, individualized exercise plan and daily activity instruction to help decrease symptoms  of SOB with activities of daily living.    Expected Outcomes Short Term: Improve cardiorespiratory fitness to achieve a reduction of symptoms when performing ADLs;Long Term: Be able to perform more ADLs without symptoms or delay the onset of symptoms    Heart Failure Yes    Intervention Provide a combined exercise and nutrition program that is supplemented with education, support and counseling about heart failure. Directed toward relieving symptoms such as shortness of breath, decreased exercise tolerance, and extremity edema.    Expected Outcomes Improve functional capacity of life;Short term: Attendance in program 2-3 days a week with increased exercise capacity. Reported lower sodium  intake. Reported increased fruit and vegetable intake. Reports medication compliance.;Short term: Daily weights obtained and reported for increase. Utilizing diuretic protocols set by physician.;Long term: Adoption of self-care skills and reduction of barriers for early signs and symptoms recognition and intervention leading to self-care maintenance.             Core Components/Risk Factors/Patient Goals Review:   Goals and Risk Factor Review     Row Name 07/03/23 0935 07/31/23 1044 08/31/23 1350         Core Components/Risk Factors/Patient Goals Review   Personal Goals Review Weight Management/Obesity;Heart Failure;Develop more efficient breathing techniques such as purse lipped breathing and diaphragmatic breathing and practicing self-pacing with activity.;Improve shortness of breath with ADL's Weight Management/Obesity;Heart Failure;Improve shortness of breath with ADL's Weight Management/Obesity;Heart Failure;Improve shortness of breath with ADL's     Review Monthly review of patient's Core Components/Risk Factors/Patient Goals are as follows: Goal progressing for improving shortness of breath with ADL's. Dominic Kane is currently able to maintain sats >88% on room air while exercising. He is exercising on the  seated elliptical and treadmill. He is slowly making progress, he has completed 6 sessions so far. Dominic Kane has increased his workload and METs on the elliptical and speed and incline on the treadmill. Dominic Kane has met his goal of developing more efficient breathing techniques such as purse lipped breathing and diaphragmatic breathing; and practicing self-pacing with activity. Dominic Kane can initiate PLB independently. He knows how to slow down his pace or stop before becoming severely dyspneic. He is practicing diaphragmatic breathing at home, before bed. Goal progressing for weight loss. Dominic Kane is working with our dietician to obtain his weight goals. He has lost ~5.7# since starting the program. Goal progressing for continued reduced heart failure signs and symptoms, and reduced HF exacerbations. Dominic Kane is knowledgeable in sticking to a low salt, cardiac diet, weighing daily, and being compliant with his HF medications. We will continue to monitor Dominic Kane's progress throughout the program. Monthly review of patient's Core Components/Risk Factors/Patient Goals are as follows: Goal progressing for improving shortness of breath with ADL's. Dominic Kane is currently able to maintain sats >88% on room air while exercising. He is exercising on the seated elliptical and treadmill. He has made great progress. Dominic Kane has increased his workload and METs on the elliptical and speed and incline on the treadmill. Goal progressing for weight loss. Dominic Kane is working with our dietician to obtain his weight goals. He has lost ~3.5# since starting the program. Goal progressing for continued reduced heart failure signs and symptoms, and reduced HF exacerbations. Dominic Kane is knowledgeable about eating a low salt, cardiac diet, weighing daily, and being compliant with his HF medications. He has had no CHF exacerbations since starting the program. We will continue to monitor Dominic Kane's progress throughout the program. Monthly review of  patient's Core Components/Risk Factors/Patient Goals are as follows: Goal progressing for improving shortness of breath with ADL's. Dominic Kane is currently able to maintain sats >88% on room air while exercising. He is exercising on the seated elliptical and treadmill. He has made great progress. Dominic Kane has increased his workload and METs on the elliptical and speed and incline on the treadmill. Goal progressing for weight loss. Dominic Kane is working with our dietician to obtain his weight goals. He has lost ~9# since starting the program. Goal progressing for continued reduced heart failure signs and symptoms, and reduced HF exacerbations. Dominic Kane is knowledgeable about eating a low salt, cardiac diet, weighing daily, and being compliant with his HF medications.  He has had no CHF exacerbations since starting the program. We will continue to monitor Dominic Kane's progress throughout the program. He will continue to benefit from PR for nutrition, education, exercise, and lifestyle modification.     Expected Outcomes For Dominic Kane to lose weight, improve shortness of breath with ADLs, and continued reduced heart failure signs and symptoms. Pt will show progress toward meeting expected goals and outcomes. Pt will show progress toward meeting expected goals and outcomes.              Core Components/Risk Factors/Patient Goals at Discharge (Final Review):   Goals and Risk Factor Review - 08/31/23 1350       Core Components/Risk Factors/Patient Goals Review   Personal Goals Review Weight Management/Obesity;Heart Failure;Improve shortness of breath with ADL's    Review Monthly review of patient's Core Components/Risk Factors/Patient Goals are as follows: Goal progressing for improving shortness of breath with ADL's. Dominic Kane is currently able to maintain sats >88% on room air while exercising. He is exercising on the seated elliptical and treadmill. He has made great progress. Dominic Kane has increased his workload and METs  on the elliptical and speed and incline on the treadmill. Goal progressing for weight loss. Dominic Kane is working with our dietician to obtain his weight goals. He has lost ~9# since starting the program. Goal progressing for continued reduced heart failure signs and symptoms, and reduced HF exacerbations. Tavien is knowledgeable about eating a low salt, cardiac diet, weighing daily, and being compliant with his HF medications. He has had no CHF exacerbations since starting the program. We will continue to monitor Baraka's progress throughout the program. He will continue to benefit from PR for nutrition, education, exercise, and lifestyle modification.    Expected Outcomes Pt will show progress toward meeting expected goals and outcomes.             ITP Comments: Pt is making expected progress toward Pulmonary Rehab goals after completing 22 session(s). Recommend continued exercise, life style modification, education, and utilization of breathing techniques to increase stamina and strength, while also decreasing shortness of breath with exertion.     Comments: Dr. Genetta Kenning is Medical Director for Pulmonary Rehab at [facility].

## 2023-09-03 ENCOUNTER — Encounter: Payer: Self-pay | Admitting: Cardiovascular Disease

## 2023-09-03 ENCOUNTER — Ambulatory Visit: Attending: Cardiovascular Disease | Admitting: Cardiovascular Disease

## 2023-09-03 ENCOUNTER — Encounter (HOSPITAL_COMMUNITY)
Admission: RE | Admit: 2023-09-03 | Discharge: 2023-09-03 | Disposition: A | Discharge status: Home / Self Care | Source: Ambulatory Visit | Attending: Cardiovascular Disease | Admitting: Cardiovascular Disease

## 2023-09-03 VITALS — BP 80/52 | HR 89 | Ht 72.0 in | Wt 214.4 lb

## 2023-09-03 DIAGNOSIS — I712 Thoracic aortic aneurysm, without rupture, unspecified: Secondary | ICD-10-CM

## 2023-09-03 DIAGNOSIS — R42 Dizziness and giddiness: Secondary | ICD-10-CM | POA: Diagnosis not present

## 2023-09-03 DIAGNOSIS — E7800 Pure hypercholesterolemia, unspecified: Secondary | ICD-10-CM

## 2023-09-03 DIAGNOSIS — I2510 Atherosclerotic heart disease of native coronary artery without angina pectoris: Secondary | ICD-10-CM

## 2023-09-03 DIAGNOSIS — I5022 Chronic systolic (congestive) heart failure: Secondary | ICD-10-CM | POA: Diagnosis not present

## 2023-09-03 DIAGNOSIS — I5042 Chronic combined systolic (congestive) and diastolic (congestive) heart failure: Secondary | ICD-10-CM

## 2023-09-03 DIAGNOSIS — I779 Disorder of arteries and arterioles, unspecified: Secondary | ICD-10-CM | POA: Diagnosis not present

## 2023-09-03 MED ORDER — RAMIPRIL 2.5 MG PO CAPS: 2.5000 mg | ORAL_CAPSULE | Freq: Every day | ORAL | 3 refills | Status: AC

## 2023-09-03 NOTE — Progress Notes (Signed)
 Daily Session Note  Patient Details  Name: Dominic Kane MRN: [id number] Date of Birth: [birth year] Referring Provider:   Gattis Kass Pulmonary Rehab Walk Test from 06/10/2023 in [facility]  Referring Provider Dr. Antoinette Batman    Encounter Date: 09/03/2023  Check In:  Session Check In - 09/03/23 1327       Check-In   Supervising physician immediately available to respond to emergencies [facility] MD immediately available    Physician(s) Slater Duncan, NP    Location [facility]    Staff Present Sueellen Emery BS, ACSM-CEP, Exercise Physiologist;Kaylee Nolon Baxter, MS, ACSM-CEP, Exercise Physiologist;Casey Carmen Chol, RN, BSN    Virtual Visit No    Medication changes reported     No    Comments Reviewed med list    Fall or balance concerns reported    Yes    Comments 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 No    PAD/SET Patient? No      Pain Assessment   Currently in Pain? No/denies    Pain Score 0-No pain    Multiple Pain Sites No          Capillary Blood Glucose: No results found for this or any previous visit (from the past 24 hours).    Social History   Tobacco Use  Smoking Status Former   Current packs/day: 0.00   Types: Cigarettes   Quit date: 07/03/1980   Years since quitting: 43.1  Smokeless Tobacco Never    Goals Met:  Independence with exercise equipment Exercise tolerated well No report of concerns or symptoms today Strength training completed today  Goals Unmet:  Not Applicable  Comments: Pt graduated today. Service time is from 1305 to 1419.    Dr. Genetta Kenning is Medical Director for Pulmonary Rehab at [facility].

## 2023-09-03 NOTE — Progress Notes (Signed)
 Home Exercise Prescription I have reviewed a Home Exercise Prescription with Dominic Kane. He is currently exercising on the bike and treadmill, 15 min each, x2 nonrehab days a week. Congratulated pt on meeting goals and encouraged him to continue. Gave goals to increase workload as able with time. The patient stated that their goals were to keep maintaining and continue increasing his fitness. We reviewed exercise guidelines, target heart rate during exercise, RPE Scale, weather conditions, endpoints for exercise, warmup and cool down. The patient is encouraged to come to me with any questions. I will continue to follow up with the patient to assist them with progression and safety.  Spent 15 min discussing home exercise plan and goals.  Alixandria Friedt [place], [place], ACSM-CEP 09/03/2023 4:06 PM

## 2023-09-03 NOTE — Progress Notes (Signed)
 i  Chief Complaint  Patient presents with   Follow-up    CAD   History of Present Illness: 83 yo male with a past medical history significant for diastolic and systolic CHF, hypertension, nonobstructive coronary artery disease, thoracic aortic aneurysm, non-ischemic cardiomyopathy, benign prostatic hypertrophy and hyperlipidemia who is here today for cardiac follow up. Prior to 2005, he was known to have LVEF around 35%. He had an episode of chest pain in 2005 leading to a cardiac cath May 2005 which showed mild non-obstructive disease. Ejection fraction was noted to be 65%. An echocardiogram during that admission in May 2005 in [place] showed an ejection fraction of 55-60% with the ascending aorta at the sinotubular junction measuring 4.1 cm. LVEF has been noted to be around 45% on studies in 2010, 2011. Echo September 2013 suggested LVEF was around 30%. Cardiac MRI December 2014 showed mild enlargement of the aortic root (4.2 cm) and and EF of 44%. Stress myoview  in December 2024 with anterior scar and possible ischemia. Cardiac cath February 2025 with mild CAD and no evidence of microvascular dysfunction. Normal right heart pressures. Echo 08/19/23 with LVEF=35-40% with global hypokinesis. Chest CTA on 09/02/23 with results pending. Ramipril  changed to Entresto  at visit here in March 2025.   He is here today for follow up. The patient denies any chest pain, dyspnea, palpitations, lower extremity edema, orthopnea, PND, dizziness, near syncope or syncope. Near syncope yesterday when working in the yard in the heat. His blood pressure has been in the 80s to 90s since starting Entresto .    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   Displacement of lumbar intervertebral disc without myelopathy    Diverticulosis of colon (without mention of hemorrhage)    Dysfunction of eustachian tube    Hard of hearingbilateral hearing aids   Enlarged prostate    GERD (gastroesophageal reflux disease)    Headache(784.0)    History of kidney stones    past hx. 15 yrs ago x1   Hypertrophy of prostate without urinary obstruction and other lower urinary tract symptoms (LUTS)    Lipoprotein deficiencies    Other and unspecified hyperlipidemia    Other primary cardiomyopathies    Pain in joint, lower leg    Personal history of colonic polyps    Personal history of other diseases of digestive system    Pes anserinus tendinitis or bursitis    left shoulder remains an issue   Sinoatrial node dysfunction (HCC)    Dr. 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    Thoracic or lumbosacral neuritis or radiculitis, unspecified    Unspecified disorder of skin and subcutaneous tissue    Unspecified essential hypertension    Unspecified vitamin D  deficiency     Past Surgical History:  Procedure Laterality Date   BASAL CELL CARCINOMA EXCISION  11/2016   Dr.  Hall   CATARACT EXTRACTION, BILATERAL     COLONOSCOPY W/ POLYPECTOMY     COLONOSCOPY WITH PROPOFOL  N/A 03/03/2022   Procedure: COLONOSCOPY WITH PROPOFOL ;  Surgeon: Ace Holder, MD;  Location: [facility] ENDOSCOPY;  Service: Gastroenterology;  Laterality: N/A;   COLONOSCOPY WITH PROPOFOL  N/A 03/04/2022   Procedure: COLONOSCOPY WITH PROPOFOL ;  Surgeon: Ace Holder, MD;  Location: [facility] ENDOSCOPY;  Service: Gastroenterology;  Laterality: N/A;   COLONOSCOPY WITH PROPOFOL  N/A 03/08/2022    Procedure: COLONOSCOPY WITH PROPOFOL ;  Surgeon: Ace Holder, MD;  Location: [facility] ENDOSCOPY;  Service: Gastroenterology;  Laterality: N/A;   CORONARY PRESSURE/FFR STUDY N/A 05/01/2023   Procedure: CORONARY PRESSURE/FFR STUDY;  Surgeon: Kyra Phy, MD;  Location: [facility] INVASIVE CV LAB;  Service: Cardiovascular;  Laterality: N/A;   DOBUTAMINE  STRESS ECHO  1/07   Lateral hypokinesis but no ischemia   ESOPHAGOGASTRODUODENOSCOPY  11/07   Schatzki's ring, non bleeding erosive gastropathy Charlotte Anderson   ESOPHAGOGASTRODUODENOSCOPY (EGD) WITH PROPOFOL  N/A 03/02/2022   Procedure: ESOPHAGOGASTRODUODENOSCOPY (EGD) WITH PROPOFOL ;  Surgeon: Sergio Dandy, MD;  Location: [facility] ENDOSCOPY;  Service: Gastroenterology;  Laterality: N/A;   FLEXIBLE SIGMOIDOSCOPY N/A 03/07/2022   Procedure: FLEXIBLE SIGMOIDOSCOPY;  Surgeon: Ace Holder, MD;  Location: [facility] ENDOSCOPY;  Service: Gastroenterology;  Laterality: N/A;   GIVENS CAPSULE STUDY N/A 03/05/2022   Procedure: GIVENS CAPSULE STUDY;  Surgeon: Ace Holder, MD;  Location: [facility] ENDOSCOPY;  Service: Gastroenterology;  Laterality: N/A;   KNEE SURGERY Left 1947   LYMPH NODE DISSECTION N/A 08/05/2016   Procedure: LIMITED LYMPH NODE DISSECTION;  Surgeon: Oralee Billow, MD;  Location: [facility] ORS;  Service: General;  Laterality: N/A;   Pulmonary functioning tests  1. 2003  2. 2005   1. Diminished lung capacity  2. Stable   RIGHT HEART CATH AND CORONARY ANGIOGRAPHY N/A 05/01/2023   Procedure: RIGHT HEART CATH AND CORONARY ANGIOGRAPHY;  Surgeon: Kyra Phy, MD;  Location: [facility] LAB;  Service: Cardiovascular;  Laterality: N/A;   THYROIDECTOMY N/A 08/05/2016   Procedure: TOTAL THYROIDECTOMY;  Surgeon: Oralee Billow, MD;  Location: [facility] ORS;  Service: General;  Laterality: N/A;   TONSILLECTOMY     TOTAL KNEE ARTHROPLASTY Left 03/18/2016   Procedure: LEFT TOTAL KNEE ARTHROPLASTY;  Surgeon: Claiborne Crew, MD;  Location: [facility] ORS;  Service: Orthopedics;   Laterality: Left;    Current Outpatient Medications  Medication Sig Dispense Refill   Accu-Chek Softclix Lancets lancets Use to check blood sugar once daily 100 each 3   acetaminophen  (TYLENOL ) 500 MG tablet Take 1,000 mg by mouth every 8 (eight) hours as needed for moderate pain (pain score 4-6).     aspirin  EC 81 MG tablet Take 1 tablet (81 mg total) by mouth daily. Swallow whole. 30 tablet 12   Chlorpheniramine-DM (CORICIDIN HBP COUGH/COLD PO) Take 1 tablet by mouth daily as needed (congestion).     Cholecalciferol (VITAMIN D -3) 125 MCG (5000 UT) TABS Take 5,000 Units by mouth at bedtime.     ciprofloxacin (CILOXAN) 0.3 % ophthalmic solution Place 2 drops into both eyes as needed (For Eye Injections).     ferrous sulfate  325 (65 FE) MG EC tablet 1 tablet with food every other day 100 tablet 3   finasteride  (PROSCAR ) 5 MG tablet Take 1 tablet (5 mg total) by mouth daily. 90 tablet 3   fluticasone (FLONASE) 50 MCG/ACT nasal spray Place 1 spray into both nostrils daily as needed for allergies or rhinitis.     furosemide  (LASIX )  40 MG tablet Take 1 tablet (40 mg total) by mouth daily. 90 tablet 1   guaiFENesin  (MUCINEX ) 600 MG 12 hr tablet Take 600 mg by mouth 2 (two) times daily as needed for cough.     Lancets (ONETOUCH DELICA PLUS LANCET33G) MISC      levothyroxine  (SYNTHROID ) 175 MCG tablet Take 1 tablet (175 mcg total) by mouth daily. 30 tablet 11   Melatonin 5 MG CAPS Take 5 mg by mouth at bedtime.     Multiple Vitamins-Minerals (PRESERVISION AREDS 2 PO) Take 1 tablet by mouth in the morning and at bedtime.     nitroGLYCERIN  (NITROSTAT ) 0.4 MG SL tablet Place 1 tablet (0.4 mg total) under the tongue every 5 (five) minutes as needed for chest pain. 25 tablet 3   omeprazole  (PRILOSEC) 20 MG capsule Take 1 capsule (20 mg total) by mouth daily. 90 capsule 3   Phenylephrine -DM-GG-APAP (TYLENOL  COLD/FLU SEVERE PO) Take 2 tablets by mouth daily as needed (cold symptoms/congestion). For High Blood  Pressure     polyethylene glycol (MIRALAX  / GLYCOLAX ) 17 g packet Take 17 g by mouth daily.     PRESCRIPTION MEDICATION every 6 (six) months. *Antibiotic Injection in Eye*     ramipril  (ALTACE ) 2.5 MG capsule Take 1 capsule (2.5 mg total) by mouth daily. 90 capsule 3   rosuvastatin  (CRESTOR ) 10 MG tablet Take 1 tablet (10 mg total) by mouth daily. 90 tablet 3   terazosin  (HYTRIN ) 10 MG capsule Take 1 capsule (10 mg total) by mouth at bedtime. 90 capsule 3   No current facility-administered medications for this visit.    Allergies  Allergen Reactions   Sulfonamide Derivatives Hives and Nausea And Vomiting   Codeine  Nausea Only    Social History   Socioeconomic History   Marital status: Married    Spouse name: Not on file   Number of children: 1   Years of education: Not on file   Highest education level: Associate degree: academic program  Occupational History   Occupation: retired Cabin crew: RETIRED  Tobacco Use   Smoking status: Former    Current packs/day: 0.00    Types: Cigarettes    Quit date: 07/03/1980    Years since quitting: 43.1   Smokeless tobacco: Never  Vaping Use   Vaping status: Never Used  Substance and Sexual Activity   Alcohol use: Yes    Alcohol/week: 0.0 standard drinks of alcohol    Comment: occassionally   Drug use: No   Sexual activity: Not on file  Other Topics Concern   Not on file  Social History Narrative   Married 1 daughter   Retired from 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   1 caffeinated drinks a day no alcohol tobacco or drug use    former smoker   Social Drivers of Corporate investment banker Strain: Low Risk  (04/29/2023)   Overall Financial Resource Strain (CARDIA)    Difficulty of Paying Living Expenses: Not hard at all  Food Insecurity: No Food Insecurity (04/29/2023)   Hunger Vital Sign    Worried About Running Out of Food in the Last Year: Never true    Ran Out of Food in the Last  Year: Never true  Transportation Needs: No Transportation Needs (04/29/2023)   PRAPARE - Administrator, Civil Service (Medical): No    Lack of Transportation (Non-Medical): No  Physical Activity: Sufficiently Active (04/29/2023)  Exercise Vital Sign    Days of Exercise per Week: 5 days    Minutes of Exercise per Session: 30 min  Stress: No Stress Concern Present (04/29/2023)   [employer] of Occupational Health - Occupational Stress Questionnaire    Feeling of Stress : Only a little  Social Connections: Moderately Integrated (04/29/2023)   Social Connection and Isolation Panel    Frequency of Communication with Friends and Family: More than three times a week    Frequency of Social Gatherings with Friends and Family: More than three times a week    Attends Religious Services: More than 4 times per year    Active Member of [employer] or Organizations: No    Attends Banker Meetings: Never    Marital Status: Married  Catering manager Violence: Not At Risk (04/29/2023)   Humiliation, Afraid, Rape, and Kick questionnaire    Fear of Current or Ex-Partner: No    Emotionally Abused: No    Physically Abused: No    Sexually Abused: No    Family History  Problem Relation Age of Onset   Heart failure Mother    Coronary artery disease Mother    Stroke Mother    Hypothyroidism Mother    Cancer Father        LUNG   Cancer Sister        BREAST   Heart failure Maternal Grandmother    Hypothyroidism Daughter    Colon cancer Neg Hx     Review of Systems:  As stated in the HPI and otherwise negative.   BP (!) 80/52   Pulse 89   Ht 6' (1.829 m)   Wt 214 lb 6.4 oz (97.3 kg)   SpO2 93%   BMI 29.08 kg/m   Physical Examination: General: Well developed, well nourished, NAD  HEENT: OP clear, mucus membranes moist  SKIN: warm, dry. No rashes. Neuro: No focal deficits  Musculoskeletal: Muscle strength 5/5 all ext  Psychiatric: Mood and affect normal  Neck: No JVD, no  carotid bruits, no thyromegaly, no lymphadenopathy.  Lungs:Clear bilaterally, no wheezes, rhonci, crackles Cardiovascular: Regular rate and rhythm. No murmurs, gallops or rubs. Abdomen:Soft. Bowel sounds present. Non-tender.  Extremities: No lower extremity edema. Pulses are 2 + in the bilateral DP/PT.  EKG:  EKG is not ordered today. The ekg ordered today demonstrates:   Recent Labs: 04/14/2023: ALT 12 04/28/2023: Platelets 165 05/01/2023: Hemoglobin 12.6 05/29/2023: TSH 6.440 08/31/2023: BUN 26; Creatinine, Ser 1.23; Potassium 5.1; Sodium 140   Lipid Panel    Component Value Date/Time   CHOL 128 04/14/2023 0803   TRIG 84.0 04/14/2023 0803   HDL 42.30 04/14/2023 0803   CHOLHDL 3 04/14/2023 0803   VLDL 16.8 04/14/2023 0803   LDLCALC 69 04/14/2023 0803     Wt Readings from Last 3 Encounters:  09/03/23 214 lb 6.4 oz (97.3 kg)  09/01/23 212 lb 15.4 oz (96.6 kg)  08/18/23 214 lb 1.1 oz (97.1 kg)    Assessment and Plan:   1. CAD without angina:  Mild non-obstructive CAD by cardiac cath in February 2025. Continue ASA and statin.   2. Non-ischemic Cardiomyopathy: LVEF around 35-40% on echo May 2025. No beta blocker secondary to bradycardia. Soft BP and dizziness on Entresto . Will stop Entresto  and restart Ramipril  2.5 once daily.   3. Thoracic aortic aneurysm:  4.0 cm by CTA May 2024. Study pending from 09/02/23.   4. HLD: LDL 69 in January 2025. Continue statin   5. Chronic  systolic CHF: Wt is stable. No volume overload on exam. Continue Lasix .   6. Carotid bruit, right: Carotid dopplers February 2025 with mild bilateral carotid plaque.    7. Dizziness: His BP has been soft. Will stop Entresto  and restart low dose Ramipril   Labs/ tests ordered today include:  No orders of the defined types were placed in this encounter.  Disposition:   F/U with me in 12  months  Signed, Antoinette Batman, MD 09/03/2023 3:41 PM    [facility] [address] [place],  [place], [place]  [address] Phone: [phone]; Fax: [phone]

## 2023-09-03 NOTE — Progress Notes (Signed)
 Discharge Progress Report  Patient Details  Name: Dominic Kane MRN: [id number] Date of Birth: 1940-06-21 Referring Provider:   Gattis Kass Pulmonary Rehab Walk Test from 06/10/2023 in [facility]  Referring Provider Dr. Antoinette Batman     Number of Visits: 23  Reason for Discharge:  Patient reached a stable level of exercise. Patient independent in their exercise. Patient has met program and personal goals.  Smoking History:  Social History   Tobacco Use  Smoking Status Former   Current packs/day: 0.00   Types: Cigarettes   Quit date: 07/03/1980   Years since quitting: 43.1  Smokeless Tobacco Never    Diagnosis:  Heart failure, systolic and diastolic, chronic (HCC)  ADL UCSD:  Pulmonary Assessment Scores     Row Name 06/10/23 1226 08/28/23 0709 09/01/23 1530     ADL UCSD   ADL Phase Entry Exit Exit   SOB Score total 44 7 --     CAT Score   CAT Score 12 9 --     mMRC Score   mMRC Score 2 -- 0      Initial Exercise Prescription:  Initial Exercise Prescription - 06/10/23 1300       Date of Initial Exercise RX and Referring Provider   Date 06/10/23    Referring Provider Dr. Antoinette Batman    Expected Discharge Date 09/03/23      Recumbant Elliptical   Level 1    Minutes 15    METs 1.5      Track   Laps 5    Minutes 15    METs 1.77      Prescription Details   Frequency (times per week) 2    Duration Progress to 30 minutes of continuous aerobic without signs/symptoms of physical distress      Intensity   THRR 40-80% of Max Heartrate 55-110    Ratings of Perceived Exertion 11-13    Perceived Dyspnea 0-4      Progression   Progression Continue to progress workloads to maintain intensity without signs/symptoms of physical distress.      Resistance Training   Training Prescription Yes    Weight blue bands    Reps 10-15          Discharge Exercise Prescription  (Final Exercise Prescription Changes):  Exercise Prescription Changes - 09/01/23 1500       Response to Exercise   Blood Pressure (Admit) 124/70    Blood Pressure (Exercise) 120/60    Blood Pressure (Exit) 104/60    Heart Rate (Admit) 73 bpm    Heart Rate (Exercise) 116 bpm    Heart Rate (Exit) 93 bpm    Oxygen Saturation (Admit) 92 %    Oxygen Saturation (Exercise) 92 %    Oxygen Saturation (Exit) 91 %    Rating of Perceived Exertion (Exercise) 9    Perceived Dyspnea (Exercise) 0    Duration Continue with 30 min of aerobic exercise without signs/symptoms of physical distress.    Intensity THRR unchanged      Resistance Training   Training Prescription Yes    Weight black bands    Reps 10-15    Time 10 Minutes      Home Exercise Plan   Plans to continue exercise at [facility] (comment)    Frequency --   n/a, goal met   Initial Home Exercises Provided 09/01/23          Functional Capacity:  6 Minute Walk     Row Name 06/10/23 1214 09/01/23 1526       6 Minute Walk   Phase Initial Discharge    Distance 1140 feet 1510 feet    Distance % Change -- 32.46 %    Distance Feet Change -- 370 ft    Walk Time 6 minutes 6 minutes    # of Rest Breaks 0 0    MPH 2.16 2.86    METS 1.59 2.66    RPE 9 9    Perceived Dyspnea  0 0    VO2 Peak 5.57 9.32    Symptoms No No    Resting HR 67 bpm 73 bpm    Resting BP 106/66 126/70    Resting Oxygen Saturation  97 % 92 %    Exercise Oxygen Saturation  during 6 min walk 89 % 95 %    Max Ex. HR 74 bpm 116 bpm    Max Ex. BP 126/60 120/60    2 Minute Post BP 112/62 102/60      Interval HR   1 Minute HR 74 86    2 Minute HR 74 100    3 Minute HR 70 116    4 Minute HR 71 108    5 Minute HR 73 112    6 Minute HR 73 112    2 Minute Post HR 63 90    Interval Heart Rate? Yes --      Interval Oxygen   Interval Oxygen? Yes --    Baseline Oxygen Saturation % 97 % 92 %    1 Minute Oxygen Saturation % 93 % 94 %    1 Minute  Liters of Oxygen 0 L 0 L    2 Minute Oxygen Saturation % 92 % 94 %    2 Minute Liters of Oxygen 0 L 0 L    3 Minute Oxygen Saturation % 91 % 95 %    3 Minute Liters of Oxygen 0 L 0 L    4 Minute Oxygen Saturation % 89 % 94 %    4 Minute Liters of Oxygen 0 L 0 L    5 Minute Oxygen Saturation % 91 % 94 %    5 Minute Liters of Oxygen 0 L 0 L    6 Minute Oxygen Saturation % 90 % 95 %    6 Minute Liters of Oxygen 0 L 0 L    2 Minute Post Oxygen Saturation % 98 % 95 %    2 Minute Post Liters of Oxygen 0 L 0 L       Psychological, QOL, Others - Outcomes: PHQ 2/9:    08/28/2023    7:08 AM 06/10/2023    1:26 PM 04/29/2023    1:20 PM 08/14/2022   10:59 AM 04/28/2022    2:12 PM  Depression screen PHQ 2/9  Decreased Interest 0 0 0 0 0  Down, Depressed, Hopeless 0 0 0 0 0  PHQ - 2 Score 0 0 0 0 0  Altered sleeping 0 0     Tired, decreased energy 1 1     Change in appetite 0 0     Feeling bad or failure about yourself  0 0     Trouble concentrating 0 0     Moving slowly or fidgety/restless 0 0     Suicidal thoughts 0 0     PHQ-9 Score 1 1     Difficult doing work/chores Not  difficult at all Somewhat difficult       Quality of Life:   Personal Goals: Goals established at orientation with interventions provided to work toward goal.  Personal Goals and Risk Factors at Admission - 06/10/23 1054       Core Components/Risk Factors/Patient Goals on Admission    Weight Management Weight Loss    Improve shortness of breath with ADL's Yes    Intervention Provide education, individualized exercise plan and daily activity instruction to help decrease symptoms of SOB with activities of daily living.    Expected Outcomes Short Term: Improve cardiorespiratory fitness to achieve a reduction of symptoms when performing ADLs;Long Term: Be able to perform more ADLs without symptoms or delay the onset of symptoms    Heart Failure Yes    Intervention Provide a combined exercise and nutrition program that  is supplemented with education, support and counseling about heart failure. Directed toward relieving symptoms such as shortness of breath, decreased exercise tolerance, and extremity edema.    Expected Outcomes Improve functional capacity of life;Short term: Attendance in program 2-3 days a week with increased exercise capacity. Reported lower sodium intake. Reported increased fruit and vegetable intake. Reports medication compliance.;Short term: Daily weights obtained and reported for increase. Utilizing diuretic protocols set by physician.;Long term: Adoption of self-care skills and reduction of barriers for early signs and symptoms recognition and intervention leading to self-care maintenance.           Personal Goals Discharge:  Goals and Risk Factor Review     Row Name 07/03/23 0935 07/31/23 1044 08/31/23 1350         Core Components/Risk Factors/Patient Goals Review   Personal Goals Review Weight Management/Obesity;Heart Failure;Develop more efficient breathing techniques such as purse lipped breathing and diaphragmatic breathing and practicing self-pacing with activity.;Improve shortness of breath with ADL's Weight Management/Obesity;Heart Failure;Improve shortness of breath with ADL's Weight Management/Obesity;Heart Failure;Improve shortness of breath with ADL's     Review Monthly review of patient's Core Components/Risk Factors/Patient Goals are as follows: Goal progressing for improving shortness of breath with ADL's. Konstantinos is currently able to maintain sats >88% on room air while exercising. He is exercising on the seated elliptical and treadmill. He is slowly making progress, he has completed 6 sessions so far. Ovila has increased his workload and METs on the elliptical and speed and incline on the treadmill. Lavon has met his goal of developing more efficient breathing techniques such as purse lipped breathing and diaphragmatic breathing; and practicing self-pacing with activity.  Newman can initiate PLB independently. He knows how to slow down his pace or stop before becoming severely dyspneic. He is practicing diaphragmatic breathing at home, before bed. Goal progressing for weight loss. Michaelangelo is working with our dietician to obtain his weight goals. He has lost ~5.7# since starting the program. Goal progressing for continued reduced heart failure signs and symptoms, and reduced HF exacerbations. Rashard is knowledgeable in sticking to a low salt, cardiac diet, weighing daily, and being compliant with his HF medications. We will continue to monitor Damare's progress throughout the program. Monthly review of patient's Core Components/Risk Factors/Patient Goals are as follows: Goal progressing for improving shortness of breath with ADL's. Jarone is currently able to maintain sats >88% on room air while exercising. He is exercising on the seated elliptical and treadmill. He has made great progress. Mccall has increased his workload and METs on the elliptical and speed and incline on the treadmill. Goal progressing for weight loss. Tayvon is working  with our dietician to obtain his weight goals. He has lost ~3.5# since starting the program. Goal progressing for continued reduced heart failure signs and symptoms, and reduced HF exacerbations. Hriday is knowledgeable about eating a low salt, cardiac diet, weighing daily, and being compliant with his HF medications. He has had no CHF exacerbations since starting the program. We will continue to monitor Lelon's progress throughout the program. Monthly review of patient's Core Components/Risk Factors/Patient Goals are as follows: Goal progressing for improving shortness of breath with ADL's. Aaiden is currently able to maintain sats >88% on room air while exercising. He is exercising on the seated elliptical and treadmill. He has made great progress. Sylas has increased his workload and METs on the elliptical and speed and incline on  the treadmill. Goal progressing for weight loss. Ladarren is working with our dietician to obtain his weight goals. He has lost ~9# since starting the program. Goal progressing for continued reduced heart failure signs and symptoms, and reduced HF exacerbations. Ankush is knowledgeable about eating a low salt, cardiac diet, weighing daily, and being compliant with his HF medications. He has had no CHF exacerbations since starting the program. We will continue to monitor Domique's progress throughout the program. He will continue to benefit from PR for nutrition, education, exercise, and lifestyle modification.     Expected Outcomes For Dimetri to lose weight, improve shortness of breath with ADLs, and continued reduced heart failure signs and symptoms. Pt will show progress toward meeting expected goals and outcomes. Pt will show progress toward meeting expected goals and outcomes.        Exercise Goals and Review:  Exercise Goals     Row Name 06/10/23 1050             Exercise Goals   Increase Physical Activity Yes       Intervention Provide advice, education, support and counseling about physical activity/exercise needs.;Develop an individualized exercise prescription for aerobic and resistive training based on initial evaluation findings, risk stratification, comorbidities and participant's personal goals.       Expected Outcomes Short Term: Attend rehab on a regular basis to increase amount of physical activity.;Long Term: Exercising regularly at least 3-5 days a week.;Long Term: Add in home exercise to make exercise part of routine and to increase amount of physical activity.       Increase Strength and Stamina Yes       Intervention Provide advice, education, support and counseling about physical activity/exercise needs.;Develop an individualized exercise prescription for aerobic and resistive training based on initial evaluation findings, risk stratification, comorbidities and participant's  personal goals.       Expected Outcomes Short Term: Increase workloads from initial exercise prescription for resistance, speed, and METs.;Short Term: Perform resistance training exercises routinely during rehab and add in resistance training at home;Long Term: Improve cardiorespiratory fitness, muscular endurance and strength as measured by increased METs and functional capacity ( )       Able to understand and use rate of perceived exertion (RPE) scale Yes       Intervention Provide education and explanation on how to use RPE scale       Expected Outcomes Short Term: Able to use RPE daily in rehab to express subjective intensity level;Long Term:  Able to use RPE to guide intensity level when exercising independently       Able to understand and use Dyspnea scale Yes       Intervention Provide education and explanation on how to  use Dyspnea scale       Expected Outcomes Short Term: Able to use Dyspnea scale daily in rehab to express subjective sense of shortness of breath during exertion;Long Term: Able to use Dyspnea scale to guide intensity level when exercising independently       Knowledge and understanding of Target Heart Rate Range (THRR) Yes       Intervention Provide education and explanation of THRR including how the numbers were predicted and where they are located for reference       Expected Outcomes Short Term: Able to state/look up THRR;Short Term: Able to use daily as guideline for intensity in rehab;Long Term: Able to use THRR to govern intensity when exercising independently       Understanding of Exercise Prescription Yes       Intervention Provide education, explanation, and written materials on patient's individual exercise prescription       Expected Outcomes Short Term: Able to explain program exercise prescription;Long Term: Able to explain home exercise prescription to exercise independently          Exercise Goals Re-Evaluation:  Exercise Goals Re-Evaluation     Row  Name 07/03/23 0717 07/30/23 0914 08/31/23 0738 09/03/23 1508       Exercise Goal Re-Evaluation   Exercise Goals Review Increase Physical Activity;Able to understand and use Dyspnea scale;Understanding of Exercise Prescription;Increase Strength and Stamina;Knowledge and understanding of Target Heart Rate Range (THRR);Able to understand and use rate of perceived exertion (RPE) scale Increase Physical Activity;Able to understand and use Dyspnea scale;Understanding of Exercise Prescription;Increase Strength and Stamina;Knowledge and understanding of Target Heart Rate Range (THRR);Able to understand and use rate of perceived exertion (RPE) scale Increase Physical Activity;Able to understand and use Dyspnea scale;Understanding of Exercise Prescription;Increase Strength and Stamina;Knowledge and understanding of Target Heart Rate Range (THRR);Able to understand and use rate of perceived exertion (RPE) scale Increase Physical Activity;Able to understand and use Dyspnea scale;Understanding of Exercise Prescription;Increase Strength and Stamina;Knowledge and understanding of Target Heart Rate Range (THRR);Able to understand and use rate of perceived exertion (RPE) scale    Comments Raenette Bumps has completed 6 exercise sessions. He is exercising on the recumbent ellipitical for 15 min, level 3, METs 3.9. He recently has moved from the track to the treadmill, walking 15 min, speed 2.0 mph, 0 incline, METs 2.4. He became dizzy last 2 min therefore will need to evaluate again. He performs warm up and cool down with demonstrative cues at times due to being hard of hearing. His balance is poor. Using black bands, 7.3 lbs. Will progress as tolerated. Raenette Bumps has completed 13 exercise sessions. He is exercising on the recumbent ellipitical for 15 min, level 4, METs 4.9. He is also walking on the treadmill 15 min, speed 2.5 mph, 2 incline, METs 3.4. He has progressed substantially in METs recently. His balance has improved. Using black  bands, 7.3 lbs. Will progress as tolerated. Raenette Bumps has completed 21 exercise sessions. He is exercising on the recumbent ellipitical for 15 min, level 5, METs 5.1. He is also walking on the treadmill 15 min, speed 2.6 mph, 3 incline, METs 4. He attempted the airdyne bike a couple sessions but did not like the seat. He has made significant progress and is scheduled to graduate this week. Using black bands, 7.3 lbs. Bill completed 23 exercise sessions. He was very successful in the program, increasing his 6 min walk test by 32 %, 370 ft. His max METs were 4.1. His SOB decreased from 44 to  7 and his CAT score decreased from 12 to 9. He is already exercising on his own at the gym with his wife.    Expected Outcomes Through exercise at rehab and home, the patient will decrease shortness of breath with daily activities and feel confident in carrying out an exercise regimen at home. Through exercise at rehab and home, the patient will decrease shortness of breath with daily activities and feel confident in carrying out an exercise regimen at home. Through exercise at rehab and home, the patient will decrease shortness of breath with daily activities and feel confident in carrying out an exercise regimen at home. Through exercise at rehab and home, the patient will decrease shortness of breath with daily activities and feel confident in carrying out an exercise regimen at home.       Nutrition & Weight - Outcomes:  Pre Biometrics - 06/10/23 1225       Pre Biometrics   Grip Strength 30 kg          Post Biometrics - 09/01/23 1530        Post  Biometrics   Grip Strength 32 kg          Nutrition:  Nutrition Therapy & Goals - 08/27/23 1429       Nutrition Therapy   Diet Heart Healthy Diet    Drug/Food Interactions Statins/Certain Fruits      Personal Nutrition Goals   Nutrition Goal Patient to improve diet quality by using the plate method as a guide for meal planning to include lean protein/plant  protein, fruits, vegetables, whole grains, nonfat dairy as part of a well-balanced diet.   Goal in progress.   Personal Goal #2 Patient to identify strategies for blood sugar control with goal A1c <7%   goal in progress.   Personal Goal #3 Patient to identify strategies for weight loss with goal 0.5-2.0# per week.   goal in progress.   Comments Goal in progress. Scout has medical history of CHF, HTN, CAD, hyperlipidemia. He is motivated to lose weight and improve blood sugar control. He reports 10-15# weight gain over the last year; DM2 has previously been controlled with diet. He is down 9# since starting with our program. He continues to be mindful of food choices and sodium intake. Patient will benefit from participation in pulmonary rehab for nutrition, exercise, and lifestyle modification.      Intervention Plan   Intervention Prescribe, educate and counsel regarding individualized specific dietary modifications aiming towards targeted core components such as weight, hypertension, lipid management, diabetes, heart failure and other comorbidities.;Nutrition handout(s) given to patient.    Expected Outcomes Short Term Goal: Understand basic principles of dietary content, such as calories, fat, sodium, cholesterol and nutrients.;Long Term Goal: Adherence to prescribed nutrition plan.          Nutrition Discharge:   Education Questionnaire Score:  Knowledge Questionnaire Score - 08/28/23 0708       Knowledge Questionnaire Score   Post Score 16/18         Sammie Crigler graduated the [employer] program on 09/03/23 completing 23 sessions. Psychosocial re-evaluation at time of program completion: Heru stated he failed his sleep study. He is in the process of getting home oxygen and a Cpap. He is hopeful this will provide more adequate sleep. Kirklin denies any other psy/soc barriers or concerns and states he has great support at home.  Program completion review of patient's Core  Components/Risk Factors/Patient Goals are as follows: Goal met for  improving shortness of breath with ADL's. Reace is currently able to maintain sats >88% on room air while exercising. He is exercising on the seated elliptical and treadmill. He has made great progress. Ami has increased his workload and METs on the elliptical and speed and incline on the treadmill. Goal met for weight loss. Abron is working with our dietician to obtain his weight goals. He has lost ~8.14# since starting the program. Goal met for continued reduced heart failure signs and symptoms, and reduced HF exacerbations. Devarion is knowledgeable about eating a low salt, cardiac diet, weighing daily, and being compliant with his HF medications. He has had no CHF exacerbations since starting the program. He stated that he feels better since starting the program. He is pleased that he is less short of breath with his ADLs, especially his yard work.     Goals reviewed with patient; copy given to patient.

## 2023-09-03 NOTE — Patient Instructions (Signed)
 Medication Instructions:  Your physician has recommended you make the following change in your medication:  1.) stop Entresto  2.) tomorrow, start ramipril  2.5 mg - one tablet daily  *If you need a refill on your cardiac medications before your next appointment, please call your pharmacy*  Lab Work: none If you have labs (blood work) drawn today and your tests are completely normal, you will receive your results only by: MyChart Message (if you have MyChart) OR A paper copy in the mail If you have any lab test that is abnormal or we need to change your treatment, we will call you to review the results.  Testing/Procedures: none  Follow-Up: At [facility], you and your health needs are our priority.  As part of our continuing mission to provide you with exceptional heart care, our providers are all part of one team.  This team includes your primary Cardiologist (physician) and Advanced Practice Providers or APPs (Physician Assistants and Nurse Practitioners) who all work together to provide you with the care you need, when you need it.  Your next appointment:   6 month(s)  Provider:   Antoinette Batman, MD  Or Lovette Rud, PA-C

## 2023-09-07 ENCOUNTER — Encounter (INDEPENDENT_AMBULATORY_CARE_PROVIDER_SITE_OTHER): Admitting: Ophthalmology

## 2023-09-07 DIAGNOSIS — H353132 Nonexudative age-related macular degeneration, bilateral, intermediate dry stage: Secondary | ICD-10-CM

## 2023-09-07 DIAGNOSIS — H34812 Central retinal vein occlusion, left eye, with macular edema: Secondary | ICD-10-CM

## 2023-09-07 DIAGNOSIS — I10 Essential (primary) hypertension: Secondary | ICD-10-CM | POA: Diagnosis not present

## 2023-09-07 DIAGNOSIS — H33302 Unspecified retinal break, left eye: Secondary | ICD-10-CM

## 2023-09-07 DIAGNOSIS — H35033 Hypertensive retinopathy, bilateral: Secondary | ICD-10-CM | POA: Diagnosis not present

## 2023-09-07 DIAGNOSIS — H43813 Vitreous degeneration, bilateral: Secondary | ICD-10-CM

## 2023-09-07 DIAGNOSIS — H35372 Puckering of macula, left eye: Secondary | ICD-10-CM | POA: Diagnosis not present

## 2023-09-07 DIAGNOSIS — H348112 Central retinal vein occlusion, right eye, stable: Secondary | ICD-10-CM | POA: Diagnosis not present

## 2023-09-10 ENCOUNTER — Telehealth: Payer: Self-pay | Admitting: Cardiovascular Disease

## 2023-09-10 NOTE — Telephone Encounter (Signed)
-----   Message from Marlyse Single sent at 09/09/2023 11:44 AM EDT ----- Results sent to Lavelle Posey Carrizales via [employer].   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 It has not changed for years. There were some studies done in the past that suggested the dilation was larger. I think it would be reasonable to repeat  this again in a year. If on that study the size is no different, we can likely start getting these less often. Marlyse Single, PA-C    09/09/2023 11:39 AM   ----- Message ----- From: Dannis Dy, Rad Results In Sent: 09/04/2023   9:45 AM EDT To: Von Grumbling, PA-C

## 2023-09-10 NOTE — Telephone Encounter (Signed)
 Left the pt a message to call the office back to endorse CT ANGIO CHEST AORTA results per Marlyse Single PA-C (covering for [employer] PA-C).  Will go ahead and place the repeat CT ANGIO CHEST AORTA in one year in the system.  CT Scheduling will reach out to the pt closer to that time frame to arrange this appt.   Will also place a repeat BMET in one year, to have done prior to his CT ANGIO CHEST AORTA, per CT protocol. BMET placed and released for that time.  Will endorse entire plan to the pt when he returns a call back.   Did note he review these results in his active mychart account.

## 2023-09-10 NOTE — Addendum Note (Signed)
 Addended by: Peggi Bowels on: 09/10/2023 09:45 AM   Modules accepted: Orders

## 2023-09-10 NOTE — Telephone Encounter (Signed)
  Peggi Bowels, LPN 06/30/8117  1:47 AM EDT     Pt returned a call back and is aware of these results and plan.   Pt aware we will repeat another CT ANGIO CHEST AORTA on him in one year for surveillance.  He is aware our CT Scheduler will reach out to him closer to that time frame to arrange that appt.    Pt verbalized understanding and agrees with this plan.

## 2023-09-10 NOTE — Telephone Encounter (Signed)
 Patient returned RN's call regarding CT ANGIO CHEST AORTA results.

## 2023-09-20 ENCOUNTER — Encounter: Payer: Self-pay | Admitting: Physician Assistant

## 2023-09-21 DIAGNOSIS — I7121 Aneurysm of the ascending aorta, without rupture: Secondary | ICD-10-CM | POA: Diagnosis not present

## 2023-09-21 DIAGNOSIS — I712 Thoracic aortic aneurysm, without rupture, unspecified: Secondary | ICD-10-CM | POA: Diagnosis not present

## 2023-09-22 ENCOUNTER — Telehealth: Payer: Self-pay | Admitting: *Deleted

## 2023-09-22 ENCOUNTER — Encounter: Payer: Self-pay | Admitting: Primary Care

## 2023-09-22 ENCOUNTER — Ambulatory Visit: Admitting: Primary Care

## 2023-09-22 VITALS — BP 126/66 | HR 64 | Temp 97.7°F | Ht 72.0 in | Wt 213.0 lb

## 2023-09-22 DIAGNOSIS — Z87891 Personal history of nicotine dependence: Secondary | ICD-10-CM | POA: Diagnosis not present

## 2023-09-22 DIAGNOSIS — E11311 Type 2 diabetes mellitus with unspecified diabetic retinopathy with macular edema: Secondary | ICD-10-CM

## 2023-09-22 DIAGNOSIS — G4733 Obstructive sleep apnea (adult) (pediatric): Secondary | ICD-10-CM

## 2023-09-22 DIAGNOSIS — E1169 Type 2 diabetes mellitus with other specified complication: Secondary | ICD-10-CM

## 2023-09-22 LAB — BASIC METABOLIC PANEL WITH GFR
BUN/Creatinine Ratio: 21 (ref 10–24)
BUN: 23 mg/dL (ref 8–27)
CO2: 18 mmol/L — ABNORMAL LOW (ref 20–29)
Calcium: 9.5 mg/dL (ref 8.6–10.2)
Chloride: 105 mmol/L (ref 96–106)
Creatinine, Ser: 1.07 mg/dL (ref 0.76–1.27)
Glucose: 119 mg/dL — ABNORMAL HIGH (ref 70–99)
Potassium: 4.9 mmol/L (ref 3.5–5.2)
Sodium: 141 mmol/L (ref 134–144)
eGFR: 69 mL/min/{1.73_m2} (ref >59)

## 2023-09-22 NOTE — Telephone Encounter (Signed)
-----   Message from Veva JINNY Ferrari sent at 09/22/2023  3:42 PM EDT ----- Regarding: Lab orders for Tue, 7.22.25 Patient is scheduled for CPX labs, please order future labs, Thanks , Veva

## 2023-09-22 NOTE — Patient Instructions (Signed)
 -MODERATE TO SEVERE SLEEP APNEA: Sleep apnea is a condition where your breathing repeatedly stops and starts during sleep. Your sleep study showed an average of 28.5 apneic events per hour, with your oxygen levels dropping to 82%. We will start you on auto CPAP therapy to help keep your airway open during sleep. You will need to follow up in 6-8 weeks to check your compliance and possibly do an overnight oximetry test. We have sent your CPAP order to [facility]. You were educated on CPAP mask options and maintenance, and you can change masks within the first 30 days if needed. We may adjust your CPAP settings based on your follow-up results, and if auto CPAP is not sufficient, an in-lab titration may be needed. It's important to use the CPAP regularly as required by your insurance.  -CONGESTIVE HEART FAILURE: Congestive heart failure is a condition where your heart doesn't pump blood as well as it should. You experience shortness of breath, especially with exertion or bending down. You are currently taking Lasix  and ramipril , but not Entresto  due to low blood pressure. Your shortness of breath may be related to your heart condition rather than your lungs. Using CPAP therapy for your sleep apnea may help reduce pulmonary hypertension and improve your heart failure symptoms. Continue taking your medications as prescribed, and we will monitor your symptoms with the CPAP therapy. If your shortness of breath persists, we may consider a breathing test.  INSTRUCTIONS: Please follow up in 6-8 weeks to check your compliance with the CPAP therapy and possibly do an overnight oximetry test. Continue taking Lasix  and ramipril  as prescribed. If your shortness of breath persists despite CPAP therapy, we may consider a breathing test.  Follow-up 6-8 weeks for CPAP compliance check with [facility] NP / can be virtual on Friday afternoon    CPAP and BIPAP Information CPAP and BIPAP use air pressure to keep  your airways open and help you breathe well. CPAP and BIPAP use different amounts of pressure. Your health care provider will tell you whether CPAP or BIPAP would be best for you. CPAP stands for continuous positive airway pressure. With CPAP, the amount of pressure stays the same while you breathe in and out. BIPAP stands for bi-level positive airway pressure. With BIPAP, the amount of pressure will be higher when you breathe in and lower when you breathe out. This allows you to take bigger breaths. CPAP or BIPAP may be used in the hospital or at home. You may need to have a sleep study before your provider can order a device for you to use at home. What are the advantages? CPAP and BIPAP are most often used for obstructive sleep apnea to keep the airways from collapsing when the muscles relax during sleep. CPAP or BIPAP can be used if you have: Chronic obstructive pulmonary disease. Heart failure. Medical conditions that cause muscle weakness. Other problems that cause breathing to be shallow, weak, or difficult. What are the risks? Your provider will talk with you about risks. These may include: Sores on your nose or face caused from the mask, prongs, or nasal pillows. Dry or stuffy nose or nosebleeds. Feeling gassy or bloated. Sinus or lung infection if the equipment is not cleaned well. When should CPAP or BIPAP be used? In most cases, CPAP or BIPAP is used during sleep at night or whenever the main sleep time happens. It's also used during naps. People with some medical conditions may need to wear the mask  when they're awake. Follow instructions from your provider about when to use your CPAP or BIPAP. What happens during CPAP or BIPAP?  Both CPAP and BIPAP use a small machine that uses electricity to create air pressure. A long tube connects the device to a plastic mask. Air is blown through the mask into your nose or mouth. The amount of pressure that's used to blow the air can be  adjusted. Your provider will set the pressure setting and help you find the best mask for you. Tips for using the mask There are different types and sizes of masks. If your mask does not fit well, talk with your provider about getting a different one. Some common types of masks include: Full face masks, which fit over the mouth and nose. Nasal masks, which fit over the nose. Nasal pillow or prong masks, which fit into the nostrils. The mask needs to be snug to your face, so some people feel trapped or closed in at first. If you feel this way, you may need to get used to the mask. Hold the mask loosely over your nose or mouth and then gradually put the the mask on more snugly. Slowly increase the amount of time you use the mask. If you have trouble with your mask not fitting well or leaking, talk with your provider. Do not stop using the mask. Tips for using the device Follow instructions from your provider about how to and how often to use the device. For home use, CPAP and BIPAP devices come from home health care companies. There are many different brands. Your health insurance company will help to decide which device you get. Keep the CPAP or BIPAP device and attachments clean. Ask your home health care company or check the instruction book for cleaning instructions. Make sure the humidifier is filled with germ-free (sterile) water  and is working correctly. This will help prevent a dry or stuffy nose or nosebleeds. A nasal saline mist or spray may keep your nose from getting dry and sore. Do not eat or drink while the CPAP or BIPAP device is on. Food or drinks could get pushed into your lungs by the pressure of the CPAP or BIPAP. Follow these instructions at home: Take over-the-counter and prescription medicines only as told by your provider. Do not smoke, vape, or use nicotine or tobacco. Contact a health care provider if: You have redness or pressure sores on your head, face, mouth, or nose  from the mask or headgear. You have trouble using the CPAP or BIPAP device. You have trouble going to sleep or staying asleep. Someone tells you that you snore even when wearing your CPAP or BIPAP device. Get help right away if: You have trouble breathing. You feel confused. These symptoms may be an emergency. Get help right away. Call 911. Do not wait to see if the symptoms will go away. Do not drive yourself to the hospital. This information is not intended to replace advice given to you by your health care provider. Make sure you discuss any questions you have with your health care provider. Document Revised: 07/02/2022 Document Reviewed: 07/02/2022 Elsevier Patient Education  2024 [employer].

## 2023-09-22 NOTE — Progress Notes (Signed)
 @Patient  ID: Dominic Kane, male    DOB: 1940/06/12, 83 y.o.   MRN: [id number]  No chief complaint on file.   Referring provider: Avelina Greig BRAVO, MD  HPI: 83 year old male, former smoker.  Past medical history significant for myopathy, CHF, hypertension, transient hypotension, thoracic aortic aneurysm, GERD, hypothyroidism, papillary thyroid  cancer, per lipidemia, iron deficiency anemia, vitamin D  deficiency.   Previous [facility] pulmonary encounter 10/08/2022 Patient presents today for sleep consult. No known history of sleep apnea. He had a sleep study 20-25 years ago that was reported negative. He has never been on CPAP. Wife has noted patient snores loudly at night along with abnormal breathing pattern and gasping for air in his sleep. He feels he sleeps well at night. He feels rested when he wakes up. He does take a nap a couple times a week. His wife has noticed him fall asleep watching tv or when inactive.    06/23/2023 Discussed the use of AI scribe software for clinical note transcription with the patient, who gave verbal consent to proceed.  History of Present Illness   Dominic Kane is an 83 year old male with congestive heart failure and significant cardiac history who presents with shortness of breath and suspected sleep apnea. He is accompanied by his wife.  He experiences shortness of breath that is stable except with exertion or bending down. This symptom has been progressively worsening, and he has adapted to it as much as possible. He is currently attending pulmonary rehab for heart failure, but it is too early to determine its effectiveness. He manages his heart failure with Lasix  40 mg daily and Entresto . He also takes Crestor  10 mg for cholesterol management. He monitors his weight at home and is actively trying to lose weight, averaging about two pounds per week through diet and exercise.  He has a history of loud snoring with abnormal breathing patterns and waking up  gasping, as reported by his wife. He feels rested upon waking and occasionally naps during the week. A sleep study was previously ordered but not completed due to insurance delays. There is a moderate suspicion of sleep apnea, potentially contributing to his heart failure.  His cardiac history includes congestive heart failure, cardiomyopathy, and coronary artery disease. He has no history of arrhythmias like atrial fibrillation. A CTA of the chest in May 2024 showed mild bronchial thickening and trace atelectasis.  He is a former smoker, having quit in 1981.   Assessment & Plan:    1. Loud snoring (Primary) - Home sleep test; Future   2. Witnessed episode of apnea - Home sleep test; Future  Suspected Obstructive Sleep Apnea Moderate suspicion of obstructive sleep apnea due to symptoms of loud snoring, abnormal breathing patterns, and waking up gasping. Previous sleep study 20-25 years ago was negative. Sleep apnea may contribute to congestive heart failure.  - Order home sleep study to rule out sleep apnea - If sleep apnea is confirmed, initiate CPAP therapy   Congestive Heart Failure Chronic congestive heart failure with dyspnea, particularly with exertion or bending. Managed with Lasix  and Entresto . Pulmonary rehab is ongoing. Dyspnea is likely cardiac-related rather than pulmonary. Weight loss and CPAP therapy, if sleep apnea is confirmed, may improve symptoms. - Continue Lasix  40 mg daily - Continue Entresto  - Continue pulmonary rehab - Monitor weight at home - Encourage weight loss through diet and exercise - Consider formal pulmonary function test if no improvement with pulmonary rehab and negative sleep study  Coronary Artery Disease Coronary artery disease with significant cardiac history including cardiomyopathy and thoracic aortic aneurysm. Managed with Crestor . - Continue Crestor  10 mg daily   09/22/2023- Interim hx  Discussed the use of AI scribe software for clinical  note transcription with the patient, who gave verbal consent to proceed.  History of Present Illness   Dominic Kane is an 83 year old male with congestive heart failure who presents with suspected sleep apnea and shortness of breath.  He experiences loud snoring and witnessed apneas, which led to a sleep study. HST on 07/14/23 confirmed moderate to severe sleep apnea with an average of 28.5 apneic events per hour. His baseline oxygen saturation was 89%, dropping to a low of 82% during the study. He has not previously used a CPAP machine and is not currently using one.  He has a history of congestive heart failure and experiences shortness of breath, which is stable except with exertion or bending down. He completed pulmonary rehabilitation and is currently taking Lasix  and ramipril . He previously took Entresto , but it was discontinued due to low blood pressure.  He smoked a pipe for about 15 years, quitting approximately 45 years ago. No history of asthma as a child.       Allergies  Allergen Reactions   Sulfonamide Derivatives Hives and Nausea And Vomiting   Codeine  Nausea Only    Immunization History  Administered Date(s) Administered   Fluad Quad(high Dose 65+) 11/17/2018   Influenza Whole 12/23/2006, 12/09/2007, 12/22/2008   Influenza, High Dose Seasonal PF 11/17/2014, 12/12/2015, 12/18/2016, 12/22/2017, 12/07/2019, 11/07/2020, 10/31/2021, 11/26/2022   Influenza,inj,Quad PF,6+ Mos 01/06/2013   Influenza-Unspecified 11/22/2013   Moderna Covid-19 Vaccine Bivalent Booster 25yrs & up 01/22/2021   Moderna Sars-Covid-2 Vaccination 04/06/2019, 05/04/2019, 02/20/2020   PNEUMOCOCCAL CONJUGATE-20 11/07/2020   Pfizer(Comirnaty)Fall Seasonal Vaccine 12 years and older 11/26/2022   Pneumococcal Conjugate-13 10/12/2014   Pneumococcal Polysaccharide-23 12/23/2006   Td 03/24/2001   Tdap 11/17/2014   Zoster Recombinant(Shingrix) 01/02/2022, 04/25/2022   Zoster, Live 06/23/2014    Past  Medical History:  Diagnosis Date   Abnormality of gait    Acute sinusitis, unspecified    Allergy    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   Displacement of lumbar intervertebral disc without myelopathy    Diverticulosis of colon (without mention of hemorrhage)    Dysfunction of eustachian tube    Hard of hearingbilateral hearing aids   Enlarged prostate    GERD (gastroesophageal reflux disease)    Headache(784.0)    History of kidney stones    past hx. 15 yrs ago x1   Hypertrophy of prostate without urinary obstruction and other lower urinary tract symptoms (LUTS)    Lipoprotein deficiencies    Other and unspecified hyperlipidemia    Other primary cardiomyopathies    Pain in joint, lower leg    Personal history of colonic polyps    Personal history of other diseases of digestive system    Pes anserinus tendinitis or bursitis    left shoulder remains an issue   Sinoatrial  node dysfunction (HCC)    Dr. Verlin follows   Thoracic aneurysm without mention of rupture    Thoracic or lumbosacral neuritis or radiculitis, unspecified    Unspecified disorder of skin and subcutaneous tissue    Unspecified essential hypertension    Unspecified vitamin D  deficiency     Tobacco History: Social History   Tobacco Use  Smoking Status Former   Current packs/day: 0.00   Types: Cigarettes   Quit date: 07/03/1980   Years since quitting: 43.2  Smokeless Tobacco Never   Counseling given: Not Answered   Outpatient Medications Prior to Visit  Medication Sig Dispense Refill   Accu-Chek Softclix  Lancets lancets Use to check blood sugar once daily 100 each 3   acetaminophen  (TYLENOL ) 500 MG tablet Take 1,000 mg by mouth every 8 (eight) hours as needed for moderate pain (pain score 4-6).     aspirin  EC 81 MG tablet Take 1 tablet (81 mg total) by mouth daily. Swallow whole. 30 tablet 12   Chlorpheniramine-DM (CORICIDIN HBP COUGH/COLD PO) Take 1 tablet by mouth daily as needed (congestion).     Cholecalciferol (VITAMIN D -3) 125 MCG (5000 UT) TABS Take 5,000 Units by mouth at bedtime.     ciprofloxacin (CILOXAN) 0.3 % ophthalmic solution Place 2 drops into both eyes as needed (For Eye Injections).     ferrous sulfate  325 (65 FE) MG EC tablet 1 tablet with food every other day 100 tablet 3   finasteride  (PROSCAR ) 5 MG tablet Take 1 tablet (5 mg total) by mouth daily. 90 tablet 3   fluticasone (FLONASE) 50 MCG/ACT nasal spray Place 1 spray into both nostrils daily as needed for allergies or rhinitis.     furosemide  (LASIX ) 40 MG tablet Take 1 tablet (40 mg total) by mouth daily. 90 tablet 1   guaiFENesin  (MUCINEX ) 600 MG 12 hr tablet Take 600 mg by mouth 2 (two) times daily as needed for cough.     Lancets (ONETOUCH DELICA PLUS LANCET33G) MISC      levothyroxine  (SYNTHROID ) 175 MCG tablet Take 1 tablet (175 mcg total) by mouth daily. 30 tablet 11   Melatonin 5 MG CAPS Take 5 mg by mouth at bedtime.     Multiple Vitamins-Minerals (PRESERVISION AREDS 2 PO) Take 1 tablet by mouth in the morning and at bedtime.     nitroGLYCERIN  (NITROSTAT ) 0.4 MG SL tablet Place 1 tablet (0.4 mg total) under the tongue every 5 (five) minutes as needed for chest pain. 25 tablet 3   omeprazole  (PRILOSEC) 20 MG capsule Take 1 capsule (20 mg total) by mouth daily. 90 capsule 3   Phenylephrine -DM-GG-APAP (TYLENOL  COLD/FLU SEVERE PO) Take 2 tablets by mouth daily as needed (cold symptoms/congestion). For High Blood Pressure     polyethylene glycol (MIRALAX  / GLYCOLAX ) 17 g packet Take 17 g by mouth daily.      PRESCRIPTION MEDICATION every 6 (six) months. *Antibiotic Injection in Eye*     ramipril  (ALTACE ) 2.5 MG capsule Take 1 capsule (2.5 mg total) by mouth daily. 90 capsule 3   rosuvastatin  (CRESTOR ) 10 MG tablet Take 1 tablet (10 mg total) by mouth daily. 90 tablet 3   terazosin  (HYTRIN ) 10 MG capsule Take 1 capsule (10 mg total) by mouth at bedtime. 90 capsule 3   No facility-administered medications prior to visit.   Review of Systems  Review of Systems  Constitutional: Negative.   HENT: Negative.    Respiratory:  Positive for shortness of breath.   Cardiovascular:  Negative.    Physical Exam  There were no vitals taken for this visit. Physical Exam Constitutional:      General: He is not in acute distress.    Appearance: Normal appearance. He is not ill-appearing.  HENT:     Head: Normocephalic and atraumatic.     Mouth/Throat:     Mouth: Mucous membranes are moist.     Pharynx: Oropharynx is clear.   Cardiovascular:     Rate and Rhythm: Normal rate and regular rhythm.  Pulmonary:     Effort: Pulmonary effort is normal.     Breath sounds: Normal breath sounds.   Musculoskeletal:        General: Normal range of motion.   Skin:    General: Skin is warm and dry.   Neurological:     General: No focal deficit present.     Mental Status: He is alert and oriented to person, place, and time. Mental status is at baseline.   Psychiatric:        Mood and Affect: Mood normal.        Behavior: Behavior normal.        Thought Content: Thought content normal.        Judgment: Judgment normal.      Lab Results:  CBC    Component Value Date/Time   WBC 8.8 04/28/2023 1435   WBC 8.4 12/30/2022 1100   RBC 4.33 04/28/2023 1435   RBC 3.72 (L) 12/30/2022 1100   HGB 12.6 (L) 05/01/2023 1328   HGB 13.2 04/28/2023 1435   HCT 37.0 (L) 05/01/2023 1328   HCT 39.6 04/28/2023 1435   PLT 165 04/28/2023 1435   MCV 92 04/28/2023 1435   MCH 30.5 04/28/2023 1435   MCH 31.0 03/06/2022  0021   MCHC 33.3 04/28/2023 1435   MCHC 32.9 12/30/2022 1100   RDW 11.9 04/28/2023 1435   LYMPHSABS 1.6 12/30/2022 1100   MONOABS 0.9 12/30/2022 1100   EOSABS 0.4 12/30/2022 1100   BASOSABS 0.0 12/30/2022 1100    BMET    Component Value Date/Time   NA 141 09/21/2023 1108   K 4.9 09/21/2023 1108   CL 105 09/21/2023 1108   CO2 18 (L) 09/21/2023 1108   GLUCOSE 119 (H) 09/21/2023 1108   GLUCOSE 111 (H) 04/14/2023 0803   BUN 23 09/21/2023 1108   CREATININE 1.07 09/21/2023 1108   CREATININE 1.14 08/21/2022 1530   CALCIUM  9.5 09/21/2023 1108   GFRNONAA >60 03/07/2022 0636   GFRAA 81 07/20/2019 0913    BNP    Component Value Date/Time   BNP 149.7 (H) 03/01/2022 1600    ProBNP No results found for: PROBNP  Imaging: CT ANGIO CHEST AORTA W/CM & OR WO/CM Result Date: 09/04/2023 CLINICAL DATA:  Thoracic aortic aneurysm, follow-up EXAM: CT ANGIOGRAPHY CHEST WITH CONTRAST TECHNIQUE: Multidetector CT imaging of the chest was performed using the standard protocol during bolus administration of intravenous contrast. Multiplanar CT image reconstructions and MIPs were obtained to evaluate the vascular anatomy. RADIATION DOSE REDUCTION: This exam was performed according to the departmental dose-optimization program which includes automated exposure control, adjustment of the mA and/or kV according to patient size and/or use of iterative reconstruction technique. CONTRAST:  75mL OMNIPAQUE  IOHEXOL  350 MG/ML SOLN COMPARISON:  Prior CTA chest 07/25/2022; prior CTA of the chest 06/11/2016 FINDINGS: Cardiovascular: Conventional 3 vessel arch anatomy. Scattered atherosclerotic plaque throughout the thoracic aorta. The descending thoracic aorta is highly tortuous. Stable size of the aortic root measuring 4.5 cm  at the sinuses of Valsalva. The ascending thoracic aorta remains unchanged at 4 cm in maximal diameter. This remains unchanged across several prior studies dating back to March of 2018.  calcifications present throughout the coronary arteries. Heart is normal in size. No pericardial effusion. Mediastinum/Nodes: Surgical changes of prior thyroid  resection. No mediastinal mass or adenopathy. Unremarkable thoracic esophagus. Lungs/Pleura: Lungs are clear. No pleural effusion or pneumothorax. Upper Abdomen: Extensive colonic diverticulosis noted. No evidence of active diverticulitis. No acute abnormality. Musculoskeletal: Levoconvex scoliosis of the upper thoracic spine with a dextroconvex compensatory curvature more inferiorly. No acute fracture or aggressive appearing lytic or blastic osseous lesion. Review of the MIP images confirms the above findings. IMPRESSION: 1. Continued stability of the ascending thoracic aorta which measures 4 cm in maximal diameter. This is unchanged across several prior studies dating back to March of 2018. More than 7 years of stability is highly reassuring. It is highly likely that this aortic diameter is normal for this male patient. 2. Aortic and coronary artery atherosclerotic vascular calcifications. 3. Extensive colonic diverticulosis noted incidentally in the upper abdomen. 4. Additional ancillary findings as above without interval change. Aortic Atherosclerosis (ICD10-I70.0). Electronically Signed   By: Wilkie Lent M.D.   On: 09/04/2023 09:43     Assessment & Plan:   1. Moderate obstructive sleep apnea (Primary) - Ambulatory Referral for DME  Assessment and Plan    Moderate to severe sleep apnea Moderate to severe sleep apnea confirmed by sleep study with 28.5 apneic events per hour. Baseline oxygen saturation was 89% with a nadir of 82%. - Initiate auto CPAP therapy for quicker initiation of treatment. - Send CPAP order to [facility]. - Educate on CPAP mask options and maintenance schedule, with the ability to change masks within the first 30 days. - Adjust CPAP settings as needed based on follow-up results. - Explained the  mechanism of CPAP and its role in preventing airway obstruction. - Discussed insurance requirements for compliance and the importance of regular use. - Check overnight oximetry at follow-up to re-assess oxygen levels on PAP therapy - Discussed potential need for in-lab titration if auto CPAP is insufficient. - Schedule follow-up in 6-8 weeks for compliance check and potential overnight oximetry test.  Congestive heart failure Congestive heart failure with shortness of breath, particularly with exertion or bending down. Not on Entresto  due to hypotension, but taking Lasix  and ramipril . Shortness of breath may be cardiac-related rather than pulmonary, given normal CT scan and low suspicion of pulmonary involvement. Sleep apnea may contribute to pulmonary hypertension and exacerbate heart failure symptoms. - Continue Lasix  and ramipril . - Monitor symptoms with CPAP therapy. - Consider breathing test if shortness of breath persists despite CPAP therapy.  Almarie LELON Ferrari, NP 09/22/2023

## 2023-10-05 ENCOUNTER — Encounter (INDEPENDENT_AMBULATORY_CARE_PROVIDER_SITE_OTHER): Admitting: Ophthalmology

## 2023-10-05 DIAGNOSIS — I10 Essential (primary) hypertension: Secondary | ICD-10-CM

## 2023-10-05 DIAGNOSIS — H33303 Unspecified retinal break, bilateral: Secondary | ICD-10-CM

## 2023-10-05 DIAGNOSIS — H348112 Central retinal vein occlusion, right eye, stable: Secondary | ICD-10-CM | POA: Diagnosis not present

## 2023-10-05 DIAGNOSIS — H34812 Central retinal vein occlusion, left eye, with macular edema: Secondary | ICD-10-CM

## 2023-10-05 DIAGNOSIS — H43813 Vitreous degeneration, bilateral: Secondary | ICD-10-CM

## 2023-10-05 DIAGNOSIS — H35033 Hypertensive retinopathy, bilateral: Secondary | ICD-10-CM | POA: Diagnosis not present

## 2023-10-13 ENCOUNTER — Other Ambulatory Visit (INDEPENDENT_AMBULATORY_CARE_PROVIDER_SITE_OTHER): Payer: PPO

## 2023-10-13 DIAGNOSIS — E1169 Type 2 diabetes mellitus with other specified complication: Secondary | ICD-10-CM | POA: Diagnosis not present

## 2023-10-13 DIAGNOSIS — E11311 Type 2 diabetes mellitus with unspecified diabetic retinopathy with macular edema: Secondary | ICD-10-CM | POA: Diagnosis not present

## 2023-10-13 DIAGNOSIS — E785 Hyperlipidemia, unspecified: Secondary | ICD-10-CM | POA: Diagnosis not present

## 2023-10-13 LAB — MICROALBUMIN / CREATININE URINE RATIO
Creatinine,U: 141.2 mg/dL
Microalb Creat Ratio: 6.3 mg/g (ref 0.0–30.0)
Microalb, Ur: 0.9 mg/dL (ref 0.0–1.9)

## 2023-10-13 LAB — HEMOGLOBIN A1C: Hgb A1c MFr Bld: 7.5 % — ABNORMAL HIGH (ref 4.6–6.5)

## 2023-10-13 LAB — COMPREHENSIVE METABOLIC PANEL WITH GFR
ALT: 13 U/L (ref 0–53)
AST: 16 U/L (ref 0–37)
Albumin: 4.1 g/dL (ref 3.5–5.2)
Alkaline Phosphatase: 75 U/L (ref 39–117)
BUN: 22 mg/dL (ref 6–23)
CO2: 28 meq/L (ref 19–32)
Calcium: 9.1 mg/dL (ref 8.4–10.5)
Chloride: 104 meq/L (ref 96–112)
Creatinine, Ser: 1.04 mg/dL (ref 0.40–1.50)
GFR: 66.75 mL/min (ref >60.00)
Glucose, Bld: 121 mg/dL — ABNORMAL HIGH (ref 70–99)
Potassium: 4.1 meq/L (ref 3.5–5.1)
Sodium: 139 meq/L (ref 135–145)
Total Bilirubin: 1.2 mg/dL (ref 0.2–1.2)
Total Protein: 6.7 g/dL (ref 6.0–8.3)

## 2023-10-13 LAB — LIPID PANEL
Cholesterol: 109 mg/dL (ref 0–200)
HDL: 35.2 mg/dL — ABNORMAL LOW (ref >39.00)
LDL Cholesterol: 55 mg/dL (ref 0–99)
NonHDL: 73.72
Total CHOL/HDL Ratio: 3
Triglycerides: 95 mg/dL (ref 0.0–149.0)
VLDL: 19 mg/dL (ref 0.0–40.0)

## 2023-10-14 DIAGNOSIS — G4733 Obstructive sleep apnea (adult) (pediatric): Secondary | ICD-10-CM | POA: Diagnosis not present

## 2023-10-15 ENCOUNTER — Ambulatory Visit: Payer: Self-pay | Admitting: Family Medicine

## 2023-10-15 NOTE — Progress Notes (Signed)
 No critical labs need to be addressed urgently. We will discuss labs in detail at upcoming office visit.

## 2023-10-20 ENCOUNTER — Encounter: Payer: Self-pay | Admitting: Family Medicine

## 2023-10-20 ENCOUNTER — Ambulatory Visit (INDEPENDENT_AMBULATORY_CARE_PROVIDER_SITE_OTHER): Payer: PPO | Admitting: Family Medicine

## 2023-10-20 ENCOUNTER — Ambulatory Visit: Payer: PPO | Admitting: Family Medicine

## 2023-10-20 VITALS — BP 110/62 | HR 61 | Temp 97.9°F | Ht 69.5 in | Wt 211.0 lb

## 2023-10-20 DIAGNOSIS — Z0000 Encounter for general adult medical examination without abnormal findings: Secondary | ICD-10-CM | POA: Diagnosis not present

## 2023-10-20 DIAGNOSIS — E1159 Type 2 diabetes mellitus with other circulatory complications: Secondary | ICD-10-CM | POA: Diagnosis not present

## 2023-10-20 DIAGNOSIS — Z7984 Long term (current) use of oral hypoglycemic drugs: Secondary | ICD-10-CM | POA: Diagnosis not present

## 2023-10-20 DIAGNOSIS — E785 Hyperlipidemia, unspecified: Secondary | ICD-10-CM

## 2023-10-20 DIAGNOSIS — E11319 Type 2 diabetes mellitus with unspecified diabetic retinopathy without macular edema: Secondary | ICD-10-CM | POA: Diagnosis not present

## 2023-10-20 DIAGNOSIS — E1169 Type 2 diabetes mellitus with other specified complication: Secondary | ICD-10-CM | POA: Diagnosis not present

## 2023-10-20 DIAGNOSIS — C73 Malignant neoplasm of thyroid gland: Secondary | ICD-10-CM | POA: Diagnosis not present

## 2023-10-20 DIAGNOSIS — I5022 Chronic systolic (congestive) heart failure: Secondary | ICD-10-CM | POA: Diagnosis not present

## 2023-10-20 DIAGNOSIS — E1340 Other specified diabetes mellitus with diabetic neuropathy, unspecified: Secondary | ICD-10-CM

## 2023-10-20 DIAGNOSIS — I152 Hypertension secondary to endocrine disorders: Secondary | ICD-10-CM

## 2023-10-20 DIAGNOSIS — G4733 Obstructive sleep apnea (adult) (pediatric): Secondary | ICD-10-CM | POA: Diagnosis not present

## 2023-10-20 DIAGNOSIS — E11311 Type 2 diabetes mellitus with unspecified diabetic retinopathy with macular edema: Secondary | ICD-10-CM

## 2023-10-20 LAB — HM DIABETES FOOT EXAM

## 2023-10-20 MED ORDER — EMPAGLIFLOZIN 10 MG PO TABS: 10.0000 mg | ORAL_TABLET | Freq: Every day | ORAL | 11 refills | Status: AC

## 2023-10-20 NOTE — Assessment & Plan Note (Signed)
 Chronic, followed by ophthalmology.

## 2023-10-20 NOTE — Assessment & Plan Note (Signed)
 Chronic,  at goal LDL less than 70 on Crestor  10 mg daily

## 2023-10-20 NOTE — Assessment & Plan Note (Signed)
 Chronic, Due to DM, minimally symptomatic.

## 2023-10-20 NOTE — Assessment & Plan Note (Signed)
 Chronic, well-controlled  Ramipril 2.5 mg daily

## 2023-10-20 NOTE — Assessment & Plan Note (Addendum)
 Chronic,   worsening.. discussed treatment options... GLP1 contraindicated given papillary thyroid  cancer history.  Will start farxiga give CHF history.  Farxiga 10 mg p.o. daily

## 2023-10-20 NOTE — Assessment & Plan Note (Signed)
 S/P removal of thyroid  in 2018, since  then he has been on levothyroxine  150 mcg daily.  10/15/22 TSH nml at 3.45  Followed by ENDO in past .. he plans to have me  follow at this point.  Lab Results  Component Value Date   TSH 6.440 (H) 05/29/2023

## 2023-10-20 NOTE — Patient Instructions (Signed)
Start jardiance 10 mg daily

## 2023-10-20 NOTE — Progress Notes (Signed)
 Patient ID: Dominic Kane, male    DOB: 30-Aug-1940, 83 y.o.   MRN: [id number]  This visit was conducted in person.  BP 110/62   Pulse 61   Temp 97.9 F (36.6 C) (Temporal)   Ht 5' 9.5 (1.765 m)   Wt 211 lb (95.7 kg)   SpO2 92%   BMI 30.71 kg/m    Chief Complaint  Patient presents with   Annual Exam    Part 2 MWV 02.05.2025      Subjective:   HPI: Dominic Kane is a 83 y.o. male presenting on 10/20/2023 for   PArt 2 AMW  The patient presents for complete physical and review of chronic health problems. He/She also has the following acute concerns today: none    The patient saw a LPN or RN for medicare wellness visit. 2/2/205  Prevention and wellness was reviewed in detail. Note reviewed and important notes copied below.   Diabetes:   worsened control on no med.  He has been cutting back on sweets  but less exercsie givent he heat. Lab Results  Component Value Date   HGBA1C 7.5 (H) 10/13/2023  Using medications without difficulties: Hypoglycemic episodes:none Hyperglycemic episodes: none Feet problems: no ulcers Blood Sugars averaging:  FBS   130s eye exam within last year: yes  Associated with neuropathy and retinopathy  Hypertension:    Stable control on low dose rampiril BP Readings from Last 3 Encounters:  10/20/23 110/62  09/22/23 126/66  09/03/23 (!) 80/52  Using medication without problems or lightheadedness:  occ Chest pain with exertion: none Edema: none Short of breath: mild Average home BPs: 116-122/76-87 Other issues:  Elevated Cholesterol:  LD at goal  < 70 on Crestor  10 mg daily  [facility] cholesterol from 7.24.2024 showed total 131, trig 148, HDl 37 LDL 68 Lab Results  Component Value Date   CHOL 109 10/13/2023   HDL 35.20 (L) 10/13/2023   LDLCALC 55 10/13/2023   TRIG 95.0 10/13/2023   CHOLHDL 3 10/13/2023  Using medications without problems: Muscle aches:  Diet compliance: moderate Exercise:  walking daily Other  complaints:  Cardiomyopathy , TAA, CAD and CHF followed by cardiology. Reviewed last office visit note from Orren Fabry, [place] May 02, 2022 Most recent echo showed May 13, 2022 EF 35 to 40%, stable, no valvular disease. CTA to evaluate thoracic aortic aneurysm 07/27/2022 showed stable changes  Hypothyroid and papillary thyroid  care followed by Dr.  Tommas in past   On levothyroxine  175 mcg daily.  Free t3 and free t4 stable Lab Results  Component Value Date   TSH 6.440 (H) 05/29/2023     OSA.. now on CPAP.   Relevant past medical, surgical, family and social history reviewed and updated as indicated. Interim medical history since our last visit reviewed. Allergies and medications reviewed and updated. Outpatient Medications Prior to Visit  Medication Sig Dispense Refill   Accu-Chek Softclix Lancets lancets Use to check blood sugar once daily 100 each 3   acetaminophen  (TYLENOL ) 500 MG tablet Take 1,000 mg by mouth every 8 (eight) hours as needed for moderate pain (pain score 4-6).     aspirin  EC 81 MG tablet Take 1 tablet (81 mg total) by mouth daily. Swallow whole. 30 tablet 12   Chlorpheniramine-DM (CORICIDIN HBP COUGH/COLD PO) Take 1 tablet by mouth daily as needed (congestion).     Cholecalciferol (VITAMIN D -3) 125 MCG (5000 UT) TABS Take 5,000 Units by mouth at bedtime.  ciprofloxacin (CILOXAN) 0.3 % ophthalmic solution Place 2 drops into both eyes as needed (For Eye Injections).     ferrous sulfate  325 (65 FE) MG EC tablet 1 tablet with food every other day 100 tablet 3   finasteride  (PROSCAR ) 5 MG tablet Take 1 tablet (5 mg total) by mouth daily. 90 tablet 3   fluticasone (FLONASE) 50 MCG/ACT nasal spray Place 1 spray into both nostrils daily as needed for allergies or rhinitis.     furosemide  (LASIX ) 40 MG tablet Take 1 tablet (40 mg total) by mouth daily. 90 tablet 1   guaiFENesin  (MUCINEX ) 600 MG 12 hr tablet Take 600 mg by mouth 2 (two) times daily as needed for  cough.     Lancets (ONETOUCH DELICA PLUS LANCET33G) MISC      levothyroxine  (SYNTHROID ) 175 MCG tablet Take 1 tablet (175 mcg total) by mouth daily. 30 tablet 11   Melatonin 5 MG CAPS Take 5 mg by mouth at bedtime.     Multiple Vitamins-Minerals (PRESERVISION AREDS 2 PO) Take 1 tablet by mouth in the morning and at bedtime.     nitroGLYCERIN  (NITROSTAT ) 0.4 MG SL tablet Place 1 tablet (0.4 mg total) under the tongue every 5 (five) minutes as needed for chest pain. 25 tablet 3   omeprazole  (PRILOSEC) 20 MG capsule Take 1 capsule (20 mg total) by mouth daily. 90 capsule 3   Phenylephrine -DM-GG-APAP (TYLENOL  COLD/FLU SEVERE PO) Take 2 tablets by mouth daily as needed (cold symptoms/congestion). For High Blood Pressure     polyethylene glycol (MIRALAX  / GLYCOLAX ) 17 g packet Take 17 g by mouth daily.     PRESCRIPTION MEDICATION every 6 (six) months. *Antibiotic Injection in Eye*     ramipril  (ALTACE ) 2.5 MG capsule Take 1 capsule (2.5 mg total) by mouth daily. 90 capsule 3   rosuvastatin  (CRESTOR ) 10 MG tablet Take 1 tablet (10 mg total) by mouth daily. 90 tablet 3   terazosin  (HYTRIN ) 10 MG capsule Take 1 capsule (10 mg total) by mouth at bedtime. 90 capsule 3   No facility-administered medications prior to visit.     Per HPI unless specifically indicated in ROS section below Review of Systems  Constitutional:  Positive for fever. Negative for fatigue.  HENT:  Positive for sinus pressure and sore throat. Negative for ear pain.   Eyes:  Negative for pain.  Respiratory:  Positive for cough. Negative for shortness of breath.   Cardiovascular:  Negative for chest pain, palpitations and leg swelling.  Gastrointestinal:  Negative for abdominal pain.  Genitourinary:  Negative for dysuria.  Musculoskeletal:  Negative for arthralgias.  Neurological:  Negative for syncope, light-headedness and headaches.  Psychiatric/Behavioral:  Negative for dysphoric mood.    Objective:  BP 110/62   Pulse 61    Temp 97.9 F (36.6 C) (Temporal)   Ht 5' 9.5 (1.765 m)   Wt 211 lb (95.7 kg)   SpO2 92%   BMI 30.71 kg/m   Wt Readings from Last 3 Encounters:  10/20/23 211 lb (95.7 kg)  09/22/23 213 lb (96.6 kg)  09/03/23 214 lb 6.4 oz (97.3 kg)      Physical Exam Constitutional:      Appearance: He is well-developed. He is not ill-appearing or toxic-appearing.  HENT:     Head: Normocephalic.     Right Ear: Hearing normal.     Left Ear: Hearing normal.     Nose: Congestion present.     Mouth/Throat:     Pharynx: No  oropharyngeal exudate or posterior oropharyngeal erythema.  Eyes:     Conjunctiva/sclera: Conjunctivae normal.  Neck:     Thyroid : No thyroid  mass or thyromegaly.     Vascular: No carotid bruit.     Trachea: Trachea normal.  Cardiovascular:     Rate and Rhythm: Normal rate and regular rhythm.     Pulses: Normal pulses.     Heart sounds: Heart sounds not distant. No murmur heard.    No friction rub. No gallop.     Comments: No peripheral edema Pulmonary:     Effort: Pulmonary effort is normal. No respiratory distress.     Breath sounds: Normal breath sounds.  Skin:    General: Skin is warm and dry.     Findings: No rash.  Psychiatric:        Speech: Speech normal.        Behavior: Behavior normal.        Thought Content: Thought content normal.    Diabetic foot exam: Abnormal inspection No skin breakdown  Pre ulcerative calluses  Normal DP pulses Decreased  sensation to light touch and monofilament Nails thickened     Results for orders placed or performed in visit on 10/20/23  [facility] DIABETES FOOT EXAM   Collection Time: 10/20/23 12:00 AM  Result Value Ref Range   [facility] Diabetic Foot Exam done     This visit occurred during the SARS-CoV-2 public health emergency.  Safety protocols were in place, including screening questions prior to the visit, additional usage of staff PPE, and extensive cleaning of exam room while observing appropriate contact time as indicated  for disinfecting solutions.   COVID 19 screen:  No recent travel or known exposure to COVID19 The patient denies respiratory symptoms of COVID 19 at this time. The importance of social distancing was discussed today.   Assessment and Plan The patient's preventative maintenance and recommended screening tests for an annual wellness exam were reviewed in full today. Brought up to date unless services declined.  Counselled on the importance of diet, exercise, and its role in overall health and mortality. The patient's FH and SH was reviewed, including their home life, tobacco status, and drug and alcohol status.   Vaccines:Uptodate, COVID x 4.   S/P PNA,Shingrix. 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 Avram, no followed up indicated Former smoker 20 pipe, quit 40 years ago.  Prostate eval not indicated.   Problem List Items Addressed This Visit     Chronic systolic CHF (congestive heart failure) (HCC)   Chronic, followed by cardiology  Most recent echo showed May 13, 2022 EF 35 to 40%, stable, no valvular disease.  Lasix  40 mg p.o. 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 discussed treatment options... GLP1 contraindicated given papillary thyroid  cancer history.  Will start farxiga give CHF history.  Farxiga 10 mg p.o. daily       Relevant Medications   empagliflozin  (JARDIANCE ) 10 MG TABS tablet   DM retinopathy (HCC)   Chronic, followed by ophthalmology.      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      Relevant Medications   empagliflozin  (JARDIANCE ) 10 MG TABS tablet   Papillary thyroid  carcinoma (HCC)    S/P removal of thyroid  in 2018, since  then he has been on levothyroxine  150 mcg daily.  10/15/22 TSH nml at 3.45  Followed by ENDO in past .. he plans to have me  follow at this point.  Lab Results  Component Value Date   TSH 6.440 (H) 05/29/2023             Other Visit Diagnoses       Routine general medical examination at a health care facility    -  Primary     OSA (obstructive sleep apnea)            Meds ordered this encounter  Medications   empagliflozin  (JARDIANCE ) 10 MG TABS tablet    Sig: Take 1 tablet (10 mg total) by mouth daily before breakfast.    Dispense:  30 tablet    Refill:  11   Orders Placed This Encounter  Procedures   [facility] DIABETES FOOT EXAM    This external order was created through the Results Console.       Greig Ring, MD

## 2023-10-20 NOTE — Assessment & Plan Note (Signed)
Chronic, followed by cardiology  Most recent echo showed May 13, 2022 EF 35 to 40%, stable, no valvular disease.  Lasix 40 mg p.o. daily as needed Ramipril 2.5 mg daily

## 2023-10-22 ENCOUNTER — Other Ambulatory Visit: Payer: Self-pay | Admitting: Family Medicine

## 2023-10-22 MED ORDER — FUROSEMIDE 40 MG PO TABS: 40.0000 mg | ORAL_TABLET | Freq: Every day | ORAL | 1 refills | Status: DC

## 2023-11-02 ENCOUNTER — Encounter (INDEPENDENT_AMBULATORY_CARE_PROVIDER_SITE_OTHER): Admitting: Ophthalmology

## 2023-11-02 DIAGNOSIS — H353112 Nonexudative age-related macular degeneration, right eye, intermediate dry stage: Secondary | ICD-10-CM

## 2023-11-02 DIAGNOSIS — H34812 Central retinal vein occlusion, left eye, with macular edema: Secondary | ICD-10-CM | POA: Diagnosis not present

## 2023-11-02 DIAGNOSIS — H35372 Puckering of macula, left eye: Secondary | ICD-10-CM

## 2023-11-02 DIAGNOSIS — H348112 Central retinal vein occlusion, right eye, stable: Secondary | ICD-10-CM

## 2023-11-02 DIAGNOSIS — I10 Essential (primary) hypertension: Secondary | ICD-10-CM | POA: Diagnosis not present

## 2023-11-02 DIAGNOSIS — H43813 Vitreous degeneration, bilateral: Secondary | ICD-10-CM | POA: Diagnosis not present

## 2023-11-02 DIAGNOSIS — H35033 Hypertensive retinopathy, bilateral: Secondary | ICD-10-CM | POA: Diagnosis not present

## 2023-11-14 DIAGNOSIS — G4733 Obstructive sleep apnea (adult) (pediatric): Secondary | ICD-10-CM | POA: Diagnosis not present

## 2023-11-30 ENCOUNTER — Encounter (INDEPENDENT_AMBULATORY_CARE_PROVIDER_SITE_OTHER): Admitting: Ophthalmology

## 2023-12-02 ENCOUNTER — Ambulatory Visit: Admitting: Primary Care

## 2023-12-02 ENCOUNTER — Encounter: Payer: Self-pay | Admitting: Primary Care

## 2023-12-02 VITALS — BP 114/70 | HR 63 | Ht 72.0 in | Wt 211.4 lb

## 2023-12-02 DIAGNOSIS — I509 Heart failure, unspecified: Secondary | ICD-10-CM | POA: Diagnosis not present

## 2023-12-02 DIAGNOSIS — Z87891 Personal history of nicotine dependence: Secondary | ICD-10-CM

## 2023-12-02 DIAGNOSIS — G4733 Obstructive sleep apnea (adult) (pediatric): Secondary | ICD-10-CM | POA: Diagnosis not present

## 2023-12-02 NOTE — Progress Notes (Signed)
 @Patient  ID: TKAI SERFASS, male    DOB: 1941-01-31, 83 y.o.   MRN: [id number]  Chief Complaint  Patient presents with   Sleep Apnea    Referring provider: Avelina Greig BRAVO, MD  HPI: 83 year old male, former smoker.  Past medical history significant for myopathy, CHF, hypertension, transient hypotension, thoracic aortic aneurysm, GERD, hypothyroidism, papillary thyroid  cancer, per lipidemia, iron deficiency anemia, vitamin D  deficiency.   Previous [facility] pulmonary encounter 10/08/2022 Patient presents today for sleep consult. No known history of sleep apnea. He had a sleep study 20-25 years ago that was reported negative. He has never been on CPAP. Wife has noted patient snores loudly at night along with abnormal breathing pattern and gasping for air in his sleep. He feels he sleeps well at night. He feels rested when he wakes up. He does take a nap a couple times a week. His wife has noticed him fall asleep watching tv or when inactive.    06/23/2023 Discussed the use of AI scribe software for clinical note transcription with the patient, who gave verbal consent to proceed.  History of Present Illness   ESHAN TRUPIANO is an 83 year old male with congestive heart failure and significant cardiac history who presents with shortness of breath and suspected sleep apnea. He is accompanied by his wife.  He experiences shortness of breath that is stable except with exertion or bending down. This symptom has been progressively worsening, and he has adapted to it as much as possible. He is currently attending pulmonary rehab for heart failure, but it is too early to determine its effectiveness. He manages his heart failure with Lasix  40 mg daily and Entresto . He also takes Crestor  10 mg for cholesterol management. He monitors his weight at home and is actively trying to lose weight, averaging about two pounds per week through diet and exercise.  He has a history of loud snoring with abnormal  breathing patterns and waking up gasping, as reported by his wife. He feels rested upon waking and occasionally naps during the week. A sleep study was previously ordered but not completed due to insurance delays. There is a moderate suspicion of sleep apnea, potentially contributing to his heart failure.  His cardiac history includes congestive heart failure, cardiomyopathy, and coronary artery disease. He has no history of arrhythmias like atrial fibrillation. A CTA of the chest in May 2024 showed mild bronchial thickening and trace atelectasis.  He is a former smoker, having quit in 1981.   Assessment & Plan:    1. Loud snoring (Primary) - Home sleep test; Future   2. Witnessed episode of apnea - Home sleep test; Future  Suspected Obstructive Sleep Apnea Moderate suspicion of obstructive sleep apnea due to symptoms of loud snoring, abnormal breathing patterns, and waking up gasping. Previous sleep study 20-25 years ago was negative. Sleep apnea may contribute to congestive heart failure.  - Order home sleep study to rule out sleep apnea - If sleep apnea is confirmed, initiate CPAP therapy   Congestive Heart Failure Chronic congestive heart failure with dyspnea, particularly with exertion or bending. Managed with Lasix  and Entresto . Pulmonary rehab is ongoing. Dyspnea is likely cardiac-related rather than pulmonary. Weight loss and CPAP therapy, if sleep apnea is confirmed, may improve symptoms. - Continue Lasix  40 mg daily - Continue Entresto  - Continue pulmonary rehab - Monitor weight at home - Encourage weight loss through diet and exercise - Consider formal pulmonary function test if no improvement with pulmonary rehab  and negative sleep study   Coronary Artery Disease Coronary artery disease with significant cardiac history including cardiomyopathy and thoracic aortic aneurysm. Managed with Crestor . - Continue Crestor  10 mg daily   09/22/2023 Discussed the use of AI scribe  software for clinical note transcription with the patient, who gave verbal consent to proceed.  History of Present Illness   DANTON PALMATEER is an 83 year old male with congestive heart failure who presents with suspected sleep apnea and shortness of breath.  He experiences loud snoring and witnessed apneas, which led to a sleep study. HST on 07/14/23 confirmed moderate to severe sleep apnea with an average of 28.5 apneic events per hour. His baseline oxygen saturation was 89%, dropping to a low of 82% during the study. He has not previously used a CPAP machine and is not currently using one.  He has a history of congestive heart failure and experiences shortness of breath, which is stable except with exertion or bending down. He completed pulmonary rehabilitation and is currently taking Lasix  and ramipril . He previously took Entresto , but it was discontinued due to low blood pressure.  He smoked a pipe for about 15 years, quitting approximately 45 years ago. No history of asthma as a child.    1. 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 Baseline oxygen saturation was 89% with a nadir of 82%. - Initiate auto CPAP therapy for quicker initiation of treatment. - Send CPAP order to [facility]. - Educate on CPAP mask options and maintenance schedule, with the ability to change masks within the first 30 days. - Adjust CPAP settings as needed based on follow-up results. - Explained the mechanism of CPAP and its role in preventing airway obstruction. - Discussed insurance requirements for compliance and the importance of regular use. - Check overnight oximetry at follow-up to re-assess oxygen levels on PAP therapy - Discussed potential need for in-lab titration if auto CPAP is insufficient. - Schedule  follow-up in 6-8 weeks for compliance check and potential overnight oximetry test.   Congestive heart failure Congestive heart failure with shortness of breath, particularly with exertion or bending down. Not on Entresto  due to hypotension, but taking Lasix  and ramipril . Shortness of breath may be cardiac-related rather than pulmonary, given normal CT scan and low suspicion of pulmonary involvement. Sleep apnea may contribute to pulmonary hypertension and exacerbate heart failure symptoms. - Continue Lasix  and ramipril . - Monitor symptoms with CPAP therapy. - Consider breathing test if shortness of breath persists despite CPAP therapy.  12/02/2023- Interim hx  Discussed the use of AI scribe software for clinical note transcription with the patient, who gave verbal consent to proceed.  History of Present Illness NICKSON MIDDLESWORTH is an 83 year old male with sleep apnea and congestive heart failure who presents for a follow-up on CPAP therapy.   He is following up on his CPAP therapy which as started in July, which was initiated after a sleep study showed moderate to severe sleep apnea with 28.5 events per hour. He uses the CPAP machine 93% of the time, averaging 8 hours and 46 minutes per night. The CPAP settings are on auto, with a minimum pressure of 5 and a maximum of 15, averaging 11, and peaking at 14.6. His apnea score has improved to 2.1 events per hour.  He notes improved sleep quality, stating he 'sleeps deeper' and does not wake up as frequently. However, he experiences nasal congestion when first putting on the mask, which affects his ability to return to sleep if he wakes up during the night.  He manages nasal congestion with Flonase and occasionally uses Mucinex  or Coricidin, especially during hay fever season. Coricidin only lasts four hours, and he sometimes struggles with congestion when it wears off. He has adjusted the CPAP humidification settings, lowering the temperature from 82-84 to  78 degrees, which may have helped with condensation issues.  Regarding his congestive heart failure, he is on Lasix  and ramipril , having discontinued Entresto  due to hypotension. No significant changes in breathing are noted, aside from the nasal congestion. He notes that he does not have the energy reserve he would like, but he remains active at home and occasionally visits the [facility]. He quit smoking in 1982 and does not have a rescue inhaler.  He has participated in pulmonary rehab, which he found variably helpful, and is trying to apply what he learned at home. He modifies his activities to manage his energy levels, avoiding exertion during hot weather.  Airview download 11/02/23-12/01/23 Usage 28/30 days (93%); 28 days (93%) Average usage 8 hours 36 mins Pressure 5-15cm h20 (13.6cm h20) Airleaks 2.9L/min (95%) AHI 2.1   Allergies  Allergen Reactions   Sulfonamide Derivatives Hives and Nausea And Vomiting   Codeine  Nausea Only    Immunization History  Administered Date(s) Administered   Fluad Quad(high Dose 65+) 11/17/2018   INFLUENZA, HIGH DOSE SEASONAL PF 11/17/2014, 12/12/2015, 12/18/2016, 12/22/2017, 12/07/2019, 11/07/2020, 10/31/2021, 11/26/2022   Influenza Whole 12/23/2006, 12/09/2007, 12/22/2008   Influenza,inj,Quad PF,6+ Mos 01/06/2013   Influenza-Unspecified 11/22/2013   Moderna Covid-19 Vaccine Bivalent Booster 30yrs & up 01/22/2021   Moderna Sars-Covid-2 Vaccination 04/06/2019, 05/04/2019, 02/20/2020   PNEUMOCOCCAL CONJUGATE-20 11/07/2020   Pfizer(Comirnaty)Fall Seasonal Vaccine 12 years and older 11/26/2022   Pneumococcal Conjugate-13 10/12/2014   Pneumococcal Polysaccharide-23 12/23/2006   Td 03/24/2001   Tdap 11/17/2014   Zoster Recombinant(Shingrix) 01/02/2022, 04/25/2022   Zoster, Live 06/23/2014    Past Medical History:  Diagnosis Date   Abnormality of gait    Acute sinusitis, unspecified    Allergy    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   Displacement of lumbar intervertebral disc without myelopathy    Diverticulosis of colon (without mention of hemorrhage)    Dysfunction of eustachian tube    Hard of hearingbilateral hearing aids   Enlarged prostate    GERD (gastroesophageal reflux disease)    Headache(784.0)    History of kidney stones    past hx. 15 yrs ago x1   Hypertrophy of prostate without urinary obstruction and other lower urinary tract symptoms (LUTS)    Lipoprotein deficiencies    Other and unspecified hyperlipidemia    Other primary cardiomyopathies    Pain in joint, lower leg    Personal history of colonic polyps    Personal history of other diseases of digestive system    Pes anserinus tendinitis or  bursitis    left shoulder remains an issue   Sinoatrial node dysfunction (HCC)    Dr. Verlin follows   Thoracic aneurysm without mention of rupture    Thoracic or lumbosacral neuritis or radiculitis, unspecified    Unspecified disorder of skin and subcutaneous tissue    Unspecified essential hypertension    Unspecified vitamin D  deficiency     Tobacco History: Social History   Tobacco Use  Smoking Status Former   Current packs/day: 0.00   Types: Cigarettes   Quit date: 07/03/1980   Years since quitting: 43.4  Smokeless Tobacco Never   Counseling given: Not Answered   Outpatient Medications Prior to Visit  Medication Sig Dispense Refill   Accu-Chek Softclix Lancets lancets Use to check blood sugar once daily 100 each 3   acetaminophen  (TYLENOL ) 500 MG tablet Take 1,000 mg by mouth every 8 (eight) hours as needed for  moderate pain (pain score 4-6).     aspirin  EC 81 MG tablet Take 1 tablet (81 mg total) by mouth daily. Swallow whole. 30 tablet 12   Chlorpheniramine-DM (CORICIDIN HBP COUGH/COLD PO) Take 1 tablet by mouth daily as needed (congestion).     Cholecalciferol (VITAMIN D -3) 125 MCG (5000 UT) TABS Take 5,000 Units by mouth at bedtime.     ciprofloxacin (CILOXAN) 0.3 % ophthalmic solution Place 2 drops into both eyes as needed (For Eye Injections).     empagliflozin  (JARDIANCE ) 10 MG TABS tablet Take 1 tablet (10 mg total) by mouth daily before breakfast. 30 tablet 11   ferrous sulfate  325 (65 FE) MG EC tablet 1 tablet with food every other day 100 tablet 3   finasteride  (PROSCAR ) 5 MG tablet Take 1 tablet (5 mg total) by mouth daily. 90 tablet 3   fluticasone (FLONASE) 50 MCG/ACT nasal spray Place 1 spray into both nostrils daily as needed for allergies or rhinitis.     furosemide  (LASIX ) 40 MG tablet Take 1 tablet (40 mg total) by mouth daily. 90 tablet 1   guaiFENesin  (MUCINEX ) 600 MG 12 hr tablet Take 600 mg by mouth 2 (two) times daily as needed for cough.     Lancets (ONETOUCH DELICA PLUS LANCET33G) MISC      levothyroxine  (SYNTHROID ) 175 MCG tablet Take 1 tablet (175 mcg total) by mouth daily. 30 tablet 11   Melatonin 5 MG CAPS Take 5 mg by mouth at bedtime.     Multiple Vitamins-Minerals (PRESERVISION AREDS 2 PO) Take 1 tablet by mouth in the morning and at bedtime.     nitroGLYCERIN  (NITROSTAT ) 0.4 MG SL tablet Place 1 tablet (0.4 mg total) under the tongue every 5 (five) minutes as needed for chest pain. 25 tablet 3   omeprazole  (PRILOSEC) 20 MG capsule Take 1 capsule (20 mg total) by mouth daily. 90 capsule 3   Phenylephrine -DM-GG-APAP (TYLENOL  COLD/FLU SEVERE PO) Take 2 tablets by mouth daily as needed (cold symptoms/congestion). For High Blood Pressure     polyethylene glycol (MIRALAX  / GLYCOLAX ) 17 g packet Take 17 g by mouth daily.     PRESCRIPTION MEDICATION every 6 (six) months.  *Antibiotic Injection in Eye*     ramipril  (ALTACE ) 2.5 MG capsule Take 1 capsule (2.5 mg total) by mouth daily. 90 capsule 3   rosuvastatin  (CRESTOR ) 10 MG tablet Take 1 tablet (10 mg total) by mouth daily. 90 tablet 3   terazosin  (HYTRIN ) 10 MG capsule Take 1 capsule (10 mg total) by mouth at bedtime. 90 capsule 3   No  facility-administered medications prior to visit.   Review of Systems  Review of Systems  Constitutional: Negative.  Negative for fatigue.  Respiratory: Negative.    Psychiatric/Behavioral:  Negative for sleep disturbance.    Physical Exam  BP 114/70 (BP Location: Left Arm, Patient Position: Sitting, Cuff Size: Normal)   Pulse 63   Ht 6' (1.829 m)   Wt 211 lb 6.4 oz (95.9 kg)   SpO2 94% Comment: RA  BMI 28.67 kg/m  Physical Exam Constitutional:      Appearance: Normal appearance. He is well-developed.  HENT:     Head: Normocephalic and atraumatic.     Mouth/Throat:     Mouth: Mucous membranes are moist.     Pharynx: Oropharynx is clear.  Cardiovascular:     Rate and Rhythm: Normal rate and regular rhythm.     Heart sounds: Normal heart sounds.  Pulmonary:     Effort: Pulmonary effort is normal. No respiratory distress.     Breath sounds: Normal breath sounds. No wheezing or rhonchi.  Musculoskeletal:        General: Normal range of motion.     Cervical back: Normal range of motion and neck supple.  Skin:    General: Skin is warm and dry.     Findings: No erythema or rash.  Neurological:     General: No focal deficit present.     Mental Status: He is alert and oriented to person, place, and time. Mental status is at baseline.  Psychiatric:        Mood and Affect: Mood normal.        Behavior: Behavior normal.        Thought Content: Thought content normal.        Judgment: Judgment normal.      Lab Results:  CBC    Component Value Date/Time   WBC 8.8 04/28/2023 1435   WBC 8.4 12/30/2022 1100   RBC 4.33 04/28/2023 1435   RBC 3.72 (L)  12/30/2022 1100   HGB 12.6 (L) 05/01/2023 1328   HGB 13.2 04/28/2023 1435   HCT 37.0 (L) 05/01/2023 1328   HCT 39.6 04/28/2023 1435   PLT 165 04/28/2023 1435   MCV 92 04/28/2023 1435   MCH 30.5 04/28/2023 1435   MCH 31.0 03/06/2022 0021   MCHC 33.3 04/28/2023 1435   MCHC 32.9 12/30/2022 1100   RDW 11.9 04/28/2023 1435   LYMPHSABS 1.6 12/30/2022 1100   MONOABS 0.9 12/30/2022 1100   EOSABS 0.4 12/30/2022 1100   BASOSABS 0.0 12/30/2022 1100    BMET    Component Value Date/Time   NA 139 10/13/2023 0829   NA 141 09/21/2023 1108   K 4.1 10/13/2023 0829   CL 104 10/13/2023 0829   CO2 28 10/13/2023 0829   GLUCOSE 121 (H) 10/13/2023 0829   BUN 22 10/13/2023 0829   BUN 23 09/21/2023 1108   CREATININE 1.04 10/13/2023 0829   CREATININE 1.14 08/21/2022 1530   CALCIUM  9.1 10/13/2023 0829   GFRNONAA >60 03/07/2022 0636   GFRAA 81 07/20/2019 0913    BNP    Component Value Date/Time   BNP 149.7 (H) 03/01/2022 1600    ProBNP No results found for: PROBNP  Imaging: No results found.   Assessment & Plan:   1. OSA (obstructive sleep apnea) (Primary)  Assessment and Plan Assessment & Plan Obstructive sleep apnea, well controlled with CPAP Obstructive sleep apnea is well controlled with CPAP therapy. CPAP download shows 93% compliance with an average usage  of 8 hours and 46 minutes per night. Apnea-hypopnea index (AHI) is reduced to 2.1 events per hour, indicating effective control. Minimal risk of cardiac arrhythmia, stroke, pulmonary hypertension, and diabetes due to well-controlled sleep apnea. Reports improved sleep quality and reduced frequency of nocturnal awakenings. - Continue CPAP therapy with current settings 5-15cm h20. - Schedule follow-up in 6 months to reassess CPAP therapy.  Congestive heart failure Congestive heart failure is managed with Lasix  and ramipril . Not on Entresto  due to hypotension. No significant changes in symptoms reported, but some shortness of  breath associated with nasal congestion. No peripheral edema. - Continue current medications: Lasix  and ramipril .  Nasal congestion associated with CPAP use Nasal congestion occurs with CPAP use, causing difficulty returning to sleep if awakened. Uses Flonase and occasionally Mucinex  or Coricidin for congestion. Coricidin does not last through the night, necessitating a longer-acting solution. CPAP humidification settings may contribute to congestion. - Try nasal spray such as Flonase or saline spray before bed. - We will lower CPAP humidification to level 2 (from level 4) and adjust temperature to 76 degrees (from 78 degrees) settings to reduce condensation and nasal congestion. - Use saline nasal spray if waking up with congestion during the night. - Continue using Coricidin as needed for congestion, as it is safe for nightly use.   Almarie LELON Ferrari, NP 12/02/2023

## 2023-12-02 NOTE — Progress Notes (Signed)
 Patient states he received Covid and high dose flu vaccine in mid August at [facility].  Will bring paperwork next visit.

## 2023-12-02 NOTE — Patient Instructions (Addendum)
  VISIT SUMMARY: Today, you had a follow-up appointment to discuss your CPAP therapy for sleep apnea and to review your congestive heart failure management. You reported good compliance with your CPAP machine and noted improvements in your sleep quality. We also discussed your nasal congestion issues related to CPAP use and reviewed your current medications for heart failure.  YOUR PLAN: -OBSTRUCTIVE SLEEP APNEA: Obstructive sleep apnea is a condition where your breathing stops and starts repeatedly during sleep. Your CPAP therapy is working well, with a significant reduction in apnea events and improved sleep quality. Continue using your CPAP machine with the current settings, and we will reassess in 6 months.  -CONGESTIVE HEART FAILURE: Congestive heart failure is a condition where your heart doesn't pump blood as well as it should. Your condition is stable with your current medications, Lasix  and ramipril . Continue taking these medications as prescribed.  -NASAL CONGESTION ASSOCIATED WITH CPAP USE: Nasal congestion can occur with CPAP use, making it difficult to return to sleep if you wake up. You can try using a nasal spray like Flonase or saline spray before bed, adjust the CPAP humidification settings, and consider using a fabric cover for the CPAP tubing. If you wake up with congestion, use a saline nasal spray. Continue using Coricidin as needed for congestion.  INSTRUCTIONS: Please continue using your CPAP machine as directed and take your medications for heart failure as prescribed. Try the suggested methods to manage nasal congestion. Schedule a follow-up appointment in 6 months to reassess your CPAP therapy.  Follow-up: 6 months with Landry NP

## 2023-12-09 ENCOUNTER — Other Ambulatory Visit: Payer: Self-pay | Admitting: Family Medicine

## 2023-12-09 MED ORDER — FINASTERIDE 5 MG PO TABS: 5.0000 mg | ORAL_TABLET | Freq: Every day | ORAL | 3 refills | Status: AC

## 2023-12-15 DIAGNOSIS — G4733 Obstructive sleep apnea (adult) (pediatric): Secondary | ICD-10-CM | POA: Diagnosis not present

## 2023-12-24 ENCOUNTER — Encounter (INDEPENDENT_AMBULATORY_CARE_PROVIDER_SITE_OTHER): Admitting: Ophthalmology

## 2023-12-24 DIAGNOSIS — H35033 Hypertensive retinopathy, bilateral: Secondary | ICD-10-CM

## 2023-12-24 DIAGNOSIS — I10 Essential (primary) hypertension: Secondary | ICD-10-CM | POA: Diagnosis not present

## 2023-12-24 DIAGNOSIS — H353132 Nonexudative age-related macular degeneration, bilateral, intermediate dry stage: Secondary | ICD-10-CM | POA: Diagnosis not present

## 2023-12-24 DIAGNOSIS — H35372 Puckering of macula, left eye: Secondary | ICD-10-CM

## 2023-12-24 DIAGNOSIS — H348112 Central retinal vein occlusion, right eye, stable: Secondary | ICD-10-CM | POA: Diagnosis not present

## 2023-12-24 DIAGNOSIS — H34812 Central retinal vein occlusion, left eye, with macular edema: Secondary | ICD-10-CM | POA: Diagnosis not present

## 2023-12-24 DIAGNOSIS — H43813 Vitreous degeneration, bilateral: Secondary | ICD-10-CM | POA: Diagnosis not present

## 2024-01-14 DIAGNOSIS — G4733 Obstructive sleep apnea (adult) (pediatric): Secondary | ICD-10-CM | POA: Diagnosis not present

## 2024-01-20 ENCOUNTER — Encounter: Payer: Self-pay | Admitting: Family Medicine

## 2024-01-20 ENCOUNTER — Ambulatory Visit (INDEPENDENT_AMBULATORY_CARE_PROVIDER_SITE_OTHER): Admitting: Family Medicine

## 2024-01-20 VITALS — BP 124/76 | HR 67 | Temp 97.9°F | Ht 69.5 in | Wt 209.5 lb

## 2024-01-20 DIAGNOSIS — I152 Hypertension secondary to endocrine disorders: Secondary | ICD-10-CM

## 2024-01-20 DIAGNOSIS — I509 Heart failure, unspecified: Secondary | ICD-10-CM

## 2024-01-20 DIAGNOSIS — E1159 Type 2 diabetes mellitus with other circulatory complications: Secondary | ICD-10-CM | POA: Diagnosis not present

## 2024-01-20 DIAGNOSIS — Z7984 Long term (current) use of oral hypoglycemic drugs: Secondary | ICD-10-CM | POA: Diagnosis not present

## 2024-01-20 DIAGNOSIS — E11319 Type 2 diabetes mellitus with unspecified diabetic retinopathy without macular edema: Secondary | ICD-10-CM

## 2024-01-20 DIAGNOSIS — E119 Type 2 diabetes mellitus without complications: Secondary | ICD-10-CM

## 2024-01-20 DIAGNOSIS — E1340 Other specified diabetes mellitus with diabetic neuropathy, unspecified: Secondary | ICD-10-CM

## 2024-01-20 LAB — POCT GLYCOSYLATED HEMOGLOBIN (HGB A1C): Hemoglobin A1C: 6 % — AB (ref 4.0–5.6)

## 2024-01-20 MED ORDER — BLOOD GLUCOSE TEST VI STRP: 1.0000 | ORAL_STRIP | 0 refills | Status: AC

## 2024-01-20 MED ORDER — LANCETS MISC: 1.0000 | 0 refills | Status: DC

## 2024-01-20 MED ORDER — LANCET DEVICE MISC: 1.0000 | 0 refills | Status: AC

## 2024-01-20 MED ORDER — FUROSEMIDE 40 MG PO TABS: 20.0000 mg | ORAL_TABLET | Freq: Every day | ORAL | Status: DC

## 2024-01-20 MED ORDER — BLOOD GLUCOSE MONITORING SUPPL DEVI: 1.0000 | 0 refills | Status: AC

## 2024-01-20 NOTE — Assessment & Plan Note (Signed)
 Chronic, Due to DM, minimally symptomatic.

## 2024-01-20 NOTE — Progress Notes (Signed)
 Patient ID: Dominic Kane, male    DOB: 05-25-1940, 83 y.o.   MRN: [id number]  This visit was conducted in person.  BP 124/76   Pulse 67   Temp 97.9 F (36.6 C) (Oral)   Ht 5' 9.5 (1.765 m)   Wt 209 lb 8 oz (95 kg)   SpO2 93%   BMI 30.49 kg/m    CC:  Chief Complaint  Patient presents with   Medical Management of Chronic Issues    Here for DM f/u.      Subjective:   HPI: Dominic Kane is a 83 y.o. male presenting on 01/20/2024 for   DM follow up   Diabetes: Significant improvement with initiation of jardiance  10 mg p.o. daily  Has noted some increase in urination... has been holding furosemide  in last few months. No swelling, no weight increase.  Lab Results  Component Value Date   HGBA1C 6.0 (A) 01/20/2024  Using medications without difficulties: none,  no UTI and no fungal infeciton in groin. Hypoglycemic episodes:none Hyperglycemic episodes: none Feet problems: no ulcers Blood Sugars averaging:  FBS  103-109 eye exam within last year: yes  Associated with neuropathy and retinopathy Wt Readings from Last 3 Encounters:  01/20/24 209 lb 8 oz (95 kg)  12/02/23 211 lb 6.4 oz (95.9 kg)  10/20/23 211 lb (95.7 kg)     Hypertension:    Stable control on low dose rampiril BP Readings from Last 3 Encounters:  01/20/24 124/76  12/02/23 114/70  10/20/23 110/62  Using medication without problems or lightheadedness:  occ Chest pain with exertion: none Edema: none Short of breath:  Home BPs: at goal   Cardiomyopathy , TAA, CAD and CHF followed by cardiology. Most recent echo showed May 13, 2022 EF 35 to 40%, stable, no valvular disease. CTA to evaluate thoracic aortic aneurysm 07/27/2022 showed stable changes  Relevant past medical, surgical, family and social history reviewed and updated as indicated. Interim medical history since our last visit reviewed. Allergies and medications reviewed and updated. Outpatient Medications Prior to Visit   Medication Sig Dispense Refill   Accu-Chek Softclix Lancets lancets Use to check blood sugar once daily 100 each 3   acetaminophen  (TYLENOL ) 500 MG tablet Take 1,000 mg by mouth every 8 (eight) hours as needed for moderate pain (pain score 4-6).     aspirin  EC 81 MG tablet Take 1 tablet (81 mg total) by mouth daily. Swallow whole. 30 tablet 12   Chlorpheniramine-DM (CORICIDIN HBP COUGH/COLD PO) Take 1 tablet by mouth daily as needed (congestion).     Cholecalciferol (VITAMIN D -3) 125 MCG (5000 UT) TABS Take 5,000 Units by mouth at bedtime.     ciprofloxacin (CILOXAN) 0.3 % ophthalmic solution Place 2 drops into both eyes as needed (For Eye Injections).     empagliflozin  (JARDIANCE ) 10 MG TABS tablet Take 1 tablet (10 mg total) by mouth daily before breakfast. 30 tablet 11   ferrous sulfate  325 (65 FE) MG EC tablet 1 tablet with food every other day 100 tablet 3   finasteride  (PROSCAR ) 5 MG tablet Take 1 tablet (5 mg total) by mouth daily. 90 tablet 3   fluticasone (FLONASE) 50 MCG/ACT nasal spray Place 1 spray into both nostrils daily as needed for allergies or rhinitis.     guaiFENesin  (MUCINEX ) 600 MG 12 hr tablet Take 600 mg by mouth 2 (two) times daily as needed for cough.     Lancets (ONETOUCH DELICA PLUS LANCET33G) MISC  levothyroxine  (SYNTHROID ) 175 MCG tablet Take 1 tablet (175 mcg total) by mouth daily. 30 tablet 11   Melatonin 5 MG CAPS Take 5 mg by mouth at bedtime.     Multiple Vitamins-Minerals (PRESERVISION AREDS 2 PO) Take 1 tablet by mouth in the morning and at bedtime.     nitroGLYCERIN  (NITROSTAT ) 0.4 MG SL tablet Place 1 tablet (0.4 mg total) under the tongue every 5 (five) minutes as needed for chest pain. 25 tablet 3   omeprazole  (PRILOSEC) 20 MG capsule Take 1 capsule (20 mg total) by mouth daily. 90 capsule 3   Phenylephrine -DM-GG-APAP (TYLENOL  COLD/FLU SEVERE PO) Take 2 tablets by mouth daily as needed (cold symptoms/congestion). For High Blood Pressure      polyethylene glycol (MIRALAX  / GLYCOLAX ) 17 g packet Take 17 g by mouth daily.     PRESCRIPTION MEDICATION every 6 (six) months. *Antibiotic Injection in Eye*     ramipril  (ALTACE ) 2.5 MG capsule Take 1 capsule (2.5 mg total) by mouth daily. 90 capsule 3   rosuvastatin  (CRESTOR ) 10 MG tablet Take 1 tablet (10 mg total) by mouth daily. 90 tablet 3   terazosin  (HYTRIN ) 10 MG capsule Take 1 capsule (10 mg total) by mouth at bedtime. 90 capsule 3   furosemide  (LASIX ) 40 MG tablet Take 1 tablet (40 mg total) by mouth daily. (Patient not taking: Reported on 01/20/2024) 90 tablet 1   No facility-administered medications prior to visit.     Per HPI unless specifically indicated in ROS section below Review of Systems  Constitutional:  Positive for fever. Negative for fatigue.  HENT:  Positive for sinus pressure and sore throat. Negative for ear pain.   Eyes:  Negative for pain.  Respiratory:  Positive for cough. Negative for shortness of breath.   Cardiovascular:  Negative for chest pain, palpitations and leg swelling.  Gastrointestinal:  Negative for abdominal pain.  Genitourinary:  Negative for dysuria.  Musculoskeletal:  Negative for arthralgias.  Neurological:  Negative for syncope, light-headedness and headaches.  Psychiatric/Behavioral:  Negative for dysphoric mood.    Objective:  BP 124/76   Pulse 67   Temp 97.9 F (36.6 C) (Oral)   Ht 5' 9.5 (1.765 m)   Wt 209 lb 8 oz (95 kg)   SpO2 93%   BMI 30.49 kg/m   Wt Readings from Last 3 Encounters:  01/20/24 209 lb 8 oz (95 kg)  12/02/23 211 lb 6.4 oz (95.9 kg)  10/20/23 211 lb (95.7 kg)      Physical Exam Constitutional:      Appearance: He is well-developed. He is not ill-appearing or toxic-appearing.  HENT:     Head: Normocephalic.     Right Ear: Hearing normal.     Left Ear: Hearing normal.     Nose: Congestion present.     Mouth/Throat:     Pharynx: No oropharyngeal exudate or posterior oropharyngeal erythema.  Eyes:      Conjunctiva/sclera: Conjunctivae normal.  Neck:     Thyroid : No thyroid  mass or thyromegaly.     Vascular: No carotid bruit.     Trachea: Trachea normal.  Cardiovascular:     Rate and Rhythm: Normal rate and regular rhythm.     Pulses: Normal pulses.     Heart sounds: Heart sounds not distant. No murmur heard.    No friction rub. No gallop.     Comments: No peripheral edema Pulmonary:     Effort: Pulmonary effort is normal. No respiratory distress.  Breath sounds: Normal breath sounds.  Skin:    General: Skin is warm and dry.     Findings: No rash.  Psychiatric:        Speech: Speech normal.        Behavior: Behavior normal.        Thought Content: Thought content normal.        Results for orders placed or performed in visit on 01/20/24  POCT glycosylated hemoglobin (Hb A1C)   Collection Time: 01/20/24 10:17 AM  Result Value Ref Range   Hemoglobin A1C 6.0 (A) 4.0 - 5.6 %   HbA1c POC (<> result, manual entry)     HbA1c, POC (prediabetic range)     HbA1c, POC (controlled diabetic range)      This visit occurred during the SARS-CoV-2 public health emergency.  Safety protocols were in place, including screening questions prior to the visit, additional usage of staff PPE, and extensive cleaning of exam room while observing appropriate contact time as indicated for disinfecting solutions.   COVID 19 screen:  No recent travel or known exposure to COVID19 The patient denies respiratory symptoms of COVID 19 at this time. The importance of social distancing was discussed today.   Assessment and Plan    Problem List Items Addressed This Visit     CHF (NYHA class II, ACC/AHA stage C) (HCC)   Euvolemic in office today . Pt stopped lasix  on his own given he thought jardiance  was a diuretic. To avoid fluid overload in setting of cardiomyopathy and CHF... I have recommended restarting Lasix  at half dose (20 mg daily) he will hold this medication if he feels like blood pressure is  dropping or he is feeling dizzy.  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 daily       Relevant Orders   POCT glycosylated hemoglobin (Hb A1C) (Completed)   DM retinopathy (HCC)   Chronic, associated with diabetes, followed by ophthalmology.      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      Other Visit Diagnoses       Diabetes mellitus treated with oral medication (HCC)            Meds ordered this encounter  Medications   Blood Glucose Monitoring Suppl DEVI    Sig: 1 each by Does not apply route as directed. Dispense based on patient and insurance preference. Use up to four times daily as directed. (FOR ICD-10 E10.9, E11.9).    Dispense:  1 each    Refill:  0   Glucose Blood (BLOOD GLUCOSE TEST STRIPS) STRP    Sig: 1 each by Does not apply route as directed. Dispense based on patient and insurance preference. Use up to four times daily as directed. (FOR ICD-10 E10.9, E11.9).    Dispense:  100 strip    Refill:  0   Lancet Device MISC    Sig: 1 each by Does not apply route as directed. Dispense based on patient and insurance preference. Use up to four times daily as directed. (FOR ICD-10 E10.9, E11.9).  Dispense:  1 each    Refill:  0   Lancets MISC    Sig: 1 each by Does not apply route as directed. Dispense based on patient and insurance preference. Use up to four times daily as directed. (FOR ICD-10 E10.9, E11.9).    Dispense:  100 each    Refill:  0   furosemide  (LASIX ) 40 MG tablet    Sig: Take 0.5 tablets (20 mg total) by mouth daily.   Orders Placed This Encounter  Procedures    POCT glycosylated hemoglobin (Hb A1C)       Dominic Ring, MD

## 2024-01-20 NOTE — Patient Instructions (Signed)
 Restart lasix  but at 20 mg daily

## 2024-01-20 NOTE — Assessment & Plan Note (Signed)
 Chronic, well-controlled  Ramipril 2.5 mg daily

## 2024-01-20 NOTE — Assessment & Plan Note (Addendum)
 Chronic,  improved control  Jardiance  10 mg p.o. daily

## 2024-01-20 NOTE — Assessment & Plan Note (Addendum)
 Euvolemic in office today . Pt stopped lasix  on his own given he thought jardiance  was a diuretic. To avoid fluid overload in setting of cardiomyopathy and CHF... I have recommended restarting Lasix  at half dose (20 mg daily) he will hold this medication if he feels like blood pressure is dropping or he is feeling dizzy.  On ACEI and now on SGLT2i

## 2024-01-20 NOTE — Assessment & Plan Note (Signed)
 Chronic, associated with diabetes, followed by ophthalmology.

## 2024-01-21 ENCOUNTER — Encounter (INDEPENDENT_AMBULATORY_CARE_PROVIDER_SITE_OTHER): Admitting: Ophthalmology

## 2024-01-21 DIAGNOSIS — H43813 Vitreous degeneration, bilateral: Secondary | ICD-10-CM

## 2024-01-21 DIAGNOSIS — I10 Essential (primary) hypertension: Secondary | ICD-10-CM | POA: Diagnosis not present

## 2024-01-21 DIAGNOSIS — H35363 Drusen (degenerative) of macula, bilateral: Secondary | ICD-10-CM | POA: Diagnosis not present

## 2024-01-21 DIAGNOSIS — H348112 Central retinal vein occlusion, right eye, stable: Secondary | ICD-10-CM

## 2024-01-21 DIAGNOSIS — H353132 Nonexudative age-related macular degeneration, bilateral, intermediate dry stage: Secondary | ICD-10-CM | POA: Diagnosis not present

## 2024-01-21 DIAGNOSIS — H35033 Hypertensive retinopathy, bilateral: Secondary | ICD-10-CM | POA: Diagnosis not present

## 2024-01-21 DIAGNOSIS — H34812 Central retinal vein occlusion, left eye, with macular edema: Secondary | ICD-10-CM | POA: Diagnosis not present

## 2024-01-26 ENCOUNTER — Other Ambulatory Visit: Payer: Self-pay | Admitting: Family Medicine

## 2024-02-10 ENCOUNTER — Other Ambulatory Visit: Payer: Self-pay | Admitting: Family Medicine

## 2024-02-10 MED ORDER — TERAZOSIN HCL 10 MG PO CAPS: 10.0000 mg | ORAL_CAPSULE | Freq: Every day | ORAL | 3 refills | Status: AC

## 2024-02-22 ENCOUNTER — Encounter (INDEPENDENT_AMBULATORY_CARE_PROVIDER_SITE_OTHER): Admitting: Ophthalmology

## 2024-02-22 DIAGNOSIS — H34812 Central retinal vein occlusion, left eye, with macular edema: Secondary | ICD-10-CM

## 2024-02-22 DIAGNOSIS — H35372 Puckering of macula, left eye: Secondary | ICD-10-CM

## 2024-02-22 DIAGNOSIS — I10 Essential (primary) hypertension: Secondary | ICD-10-CM

## 2024-02-22 DIAGNOSIS — H353112 Nonexudative age-related macular degeneration, right eye, intermediate dry stage: Secondary | ICD-10-CM

## 2024-02-22 DIAGNOSIS — H35033 Hypertensive retinopathy, bilateral: Secondary | ICD-10-CM | POA: Diagnosis not present

## 2024-02-22 DIAGNOSIS — H348112 Central retinal vein occlusion, right eye, stable: Secondary | ICD-10-CM

## 2024-02-22 DIAGNOSIS — H43813 Vitreous degeneration, bilateral: Secondary | ICD-10-CM | POA: Diagnosis not present

## 2024-02-29 ENCOUNTER — Encounter (INDEPENDENT_AMBULATORY_CARE_PROVIDER_SITE_OTHER): Admitting: Ophthalmology

## 2024-02-29 DIAGNOSIS — H26491 Other secondary cataract, right eye: Secondary | ICD-10-CM | POA: Diagnosis not present

## 2024-02-29 NOTE — Progress Notes (Unsigned)
 i  No chief complaint on file.  History of Present Illness: 83 yo male with a past medical history significant for diastolic and systolic CHF, hypertension, nonobstructive coronary artery disease, thoracic aortic aneurysm, non-ischemic cardiomyopathy, benign prostatic hypertrophy and hyperlipidemia who is here today for cardiac follow up. Prior to 2005, he was known to have LVEF around 35%. He had an episode of chest pain in 2005 leading to a cardiac cath May 2005 which showed mild non-obstructive disease. Ejection fraction was noted to be 65%. An echocardiogram during that admission in May 2005 in [place] showed an ejection fraction of 55-60% with the ascending aorta at the sinotubular junction measuring 4.1 cm. LVEF has been noted to be around 45% on studies in 2010, 2011. Echo September 2013 suggested LVEF was around 30%. Cardiac MRI December 2014 showed mild enlargement of the aortic root (4.2 cm) and and EF of 44%. Stress myoview  in December 2024 with anterior scar and possible ischemia. Cardiac cath February 2025 with mild CAD and no evidence of microvascular dysfunction. Normal right heart pressures. Echo 08/19/23 with LVEF=35-40% with global hypokinesis. Chest CTA on 09/02/23 with stable 4.0 cm dilation of the ascending aorta. Ramipril  changed to Entresto  at visit here in March 2025. He had soft blood pressures and dizziness so the Entresto  was stopped and he was started back on Ramipril .   He is here today for follow up. The patient denies any chest pain, dyspnea, palpitations, lower extremity edema, orthopnea, PND, dizziness, near syncope or syncope.    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   Displacement of lumbar intervertebral disc without myelopathy    Diverticulosis of colon (without mention of hemorrhage)    Dysfunction of eustachian tube    Hard of hearingbilateral hearing aids   Enlarged prostate    GERD (gastroesophageal reflux disease)    Headache(784.0)    History of kidney stones    past hx. 15 yrs ago x1   Hypertrophy of prostate without urinary obstruction and other lower urinary tract symptoms (LUTS)    Lipoprotein deficiencies    Other and unspecified hyperlipidemia    Other primary cardiomyopathies    Pain in joint, lower leg    Personal history of colonic polyps    Personal history of other diseases of digestive system    Pes anserinus tendinitis or bursitis    left shoulder remains an issue   Sinoatrial node dysfunction (HCC)    Dr. Verlin follows   Thoracic aneurysm without mention of rupture    Thoracic or lumbosacral neuritis or radiculitis, unspecified    Unspecified disorder of skin and subcutaneous tissue    Unspecified essential hypertension    Unspecified vitamin D  deficiency     Past Surgical History:  Procedure Laterality Date   BASAL CELL CARCINOMA EXCISION  11/2016   Dr. Shona   CATARACT EXTRACTION, BILATERAL  COLONOSCOPY W/ POLYPECTOMY     COLONOSCOPY WITH PROPOFOL  N/A 03/03/2022   Procedure: COLONOSCOPY WITH PROPOFOL ;  Surgeon: Leigh Elspeth SQUIBB, MD;  Location: [facility] ENDOSCOPY;  Service: Gastroenterology;  Laterality: N/A;   COLONOSCOPY WITH PROPOFOL  N/A 03/04/2022   Procedure: COLONOSCOPY WITH PROPOFOL ;  Surgeon: Leigh Elspeth SQUIBB, MD;  Location: [facility] ENDOSCOPY;  Service: Gastroenterology;  Laterality: N/A;   COLONOSCOPY WITH PROPOFOL  N/A 03/08/2022   Procedure: COLONOSCOPY WITH  PROPOFOL ;  Surgeon: Leigh Elspeth SQUIBB, MD;  Location: [facility] ENDOSCOPY;  Service: Gastroenterology;  Laterality: N/A;   CORONARY PRESSURE/FFR STUDY N/A 05/01/2023   Procedure: CORONARY PRESSURE/FFR STUDY;  Surgeon: Wendel Lurena POUR, MD;  Location: [facility] INVASIVE CV LAB;  Service: Cardiovascular;  Laterality: N/A;   DOBUTAMINE  STRESS ECHO  1/07   Lateral hypokinesis but no ischemia   ESOPHAGOGASTRODUODENOSCOPY  11/07   Schatzki's ring, non bleeding erosive gastropathy [place]   ESOPHAGOGASTRODUODENOSCOPY (EGD) WITH PROPOFOL  N/A 03/02/2022   Procedure: ESOPHAGOGASTRODUODENOSCOPY (EGD) WITH PROPOFOL ;  Surgeon: Shila Gustav GAILS, MD;  Location: [facility] ENDOSCOPY;  Service: Gastroenterology;  Laterality: N/A;   FLEXIBLE SIGMOIDOSCOPY N/A 03/07/2022   Procedure: FLEXIBLE SIGMOIDOSCOPY;  Surgeon: Leigh Elspeth SQUIBB, MD;  Location: [facility] ENDOSCOPY;  Service: Gastroenterology;  Laterality: N/A;   GIVENS CAPSULE STUDY N/A 03/05/2022   Procedure: GIVENS CAPSULE STUDY;  Surgeon: Leigh Elspeth SQUIBB, MD;  Location: [facility] ENDOSCOPY;  Service: Gastroenterology;  Laterality: N/A;   KNEE SURGERY Left 1947   LYMPH NODE DISSECTION N/A 08/05/2016   Procedure: LIMITED LYMPH NODE DISSECTION;  Surgeon: Eletha Boas, MD;  Location: [facility] ORS;  Service: General;  Laterality: N/A;   Pulmonary functioning tests  1. 2003  2. 2005   1. Diminished lung capacity  2. Stable   RIGHT HEART CATH AND CORONARY ANGIOGRAPHY N/A 05/01/2023   Procedure: RIGHT HEART CATH AND CORONARY ANGIOGRAPHY;  Surgeon: Wendel Lurena POUR, MD;  Location: [facility] INVASIVE CV LAB;  Service: Cardiovascular;  Laterality: N/A;   THYROIDECTOMY N/A 08/05/2016   Procedure: TOTAL THYROIDECTOMY;  Surgeon: Eletha Boas, MD;  Location: [facility] ORS;  Service: General;  Laterality: N/A;   TONSILLECTOMY     TOTAL KNEE ARTHROPLASTY Left 03/18/2016   Procedure: LEFT TOTAL KNEE ARTHROPLASTY;  Surgeon: Donnice Car, MD;  Location: [facility] ORS;  Service: Orthopedics;  Laterality: Left;     Current Outpatient Medications  Medication Sig Dispense Refill   Accu-Chek Softclix Lancets lancets Use to check blood sugar once daily 100 each 3   acetaminophen  (TYLENOL ) 500 MG tablet Take 1,000 mg by mouth every 8 (eight) hours as needed for moderate pain (pain score 4-6).     aspirin  EC 81 MG tablet Take 1 tablet (81 mg total) by mouth daily. Swallow whole. 30 tablet 12   Blood Glucose Monitoring Suppl DEVI 1 each by Does not apply route as directed. Dispense based on patient and insurance preference. Use up to four times daily as directed. (FOR ICD-10 E10.9, E11.9). 1 each 0   Chlorpheniramine-DM (CORICIDIN HBP COUGH/COLD PO) Take 1 tablet by mouth daily as needed (congestion).     Cholecalciferol (VITAMIN D -3) 125 MCG (5000 UT) TABS Take 5,000 Units by mouth at bedtime.     ciprofloxacin (CILOXAN) 0.3 % ophthalmic solution Place 2 drops into both eyes as needed (For Eye Injections).     empagliflozin  (JARDIANCE ) 10 MG TABS tablet Take 1 tablet (10 mg total) by mouth daily before breakfast. 30 tablet 11   ferrous sulfate  325 (65 FE) MG EC tablet 1 tablet with food every other  day 100 tablet 3   finasteride  (PROSCAR ) 5 MG tablet Take 1 tablet (5 mg total) by mouth daily. 90 tablet 3   fluticasone (FLONASE) 50 MCG/ACT nasal spray Place 1 spray into both nostrils daily as needed for allergies or rhinitis.     furosemide  (LASIX ) 40 MG tablet Take 0.5 tablets (20 mg total) by mouth daily.     Glucose Blood (BLOOD GLUCOSE TEST STRIPS) STRP 1 each by Does not apply route as directed. Dispense based on patient and insurance preference. Use up to four times daily as directed. (FOR ICD-10 E10.9, E11.9). 100 strip 0   guaiFENesin  (MUCINEX ) 600 MG 12 hr tablet Take 600 mg by mouth 2 (two) times daily as needed for cough.     Lancet Device MISC 1 each by Does not apply route as directed. Dispense based on patient and insurance preference. Use up to four times daily as directed. (FOR ICD-10 E10.9,  E11.9). 1 each 0   Lancets (ONETOUCH DELICA PLUS LANCET33G) MISC      Lancets MISC 1 each by Does not apply route as directed. Dispense based on patient and insurance preference. Use up to four times daily as directed. (FOR ICD-10 E10.9, E11.9). 100 each 0   levothyroxine  (SYNTHROID ) 175 MCG tablet Take 1 tablet (175 mcg total) by mouth daily. 30 tablet 11   Melatonin 5 MG CAPS Take 5 mg by mouth at bedtime.     Multiple Vitamins-Minerals (PRESERVISION AREDS 2 PO) Take 1 tablet by mouth in the morning and at bedtime.     nitroGLYCERIN  (NITROSTAT ) 0.4 MG SL tablet Place 1 tablet (0.4 mg total) under the tongue every 5 (five) minutes as needed for chest pain. 25 tablet 3   omeprazole  (PRILOSEC) 20 MG capsule TAKE 1 CAPSULE BY MOUTH EVERY DAY 90 capsule 3   Phenylephrine -DM-GG-APAP (TYLENOL  COLD/FLU SEVERE PO) Take 2 tablets by mouth daily as needed (cold symptoms/congestion). For High Blood Pressure     polyethylene glycol (MIRALAX  / GLYCOLAX ) 17 g packet Take 17 g by mouth daily.     PRESCRIPTION MEDICATION every 6 (six) months. *Antibiotic Injection in Eye*     ramipril  (ALTACE ) 2.5 MG capsule Take 1 capsule (2.5 mg total) by mouth daily. 90 capsule 3   rosuvastatin  (CRESTOR ) 10 MG tablet Take 1 tablet (10 mg total) by mouth daily. 90 tablet 3   terazosin  (HYTRIN ) 10 MG capsule Take 1 capsule (10 mg total) by mouth at bedtime. 90 capsule 3   No current facility-administered medications for this visit.    Allergies  Allergen Reactions   Sulfonamide Derivatives Hives and Nausea And Vomiting   Codeine  Nausea Only    Social History   Socioeconomic History   Marital status: Married    Spouse name: Not on file   Number of children: 1   Years of education: Not on file   Highest education level: Associate degree: occupational, scientist, product/process development, or vocational program  Occupational History   Occupation: retired Cabin Crew: RETIRED  Tobacco Use   Smoking status: Former    Current  packs/day: 0.00    Types: Cigarettes    Quit date: 07/03/1980    Years since quitting: 43.6   Smokeless tobacco: Never  Vaping Use   Vaping status: Never Used  Substance and Sexual Activity   Alcohol use: Yes    Alcohol/week: 0.0 standard drinks of alcohol    Comment: occassionally   Drug use: No   Sexual activity: Not on file  Other Topics Concern   Not on file  Social History Narrative   Married 1 daughter   Retired from [employer] career but 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   1 caffeinated drinks a day no alcohol tobacco or drug use    former smoker   Social Drivers of Corporate Investment Banker Strain: Low Risk  (10/13/2023)   Overall Financial Resource Strain (CARDIA)    Difficulty of Paying Living Expenses: Not hard at all  Food Insecurity: No Food Insecurity (10/13/2023)   Hunger Vital Sign    Worried About Running Out of Food in the Last Year: Never true    Ran Out of Food in the Last Year: Never true  Transportation Needs: No Transportation Needs (10/13/2023)   PRAPARE - Administrator, Civil Service (Medical): No    Lack of Transportation (Non-Medical): No  Physical Activity: Sufficiently Active (10/13/2023)   Exercise Vital Sign    Days of Exercise per Week: 4 days    Minutes of Exercise per Session: 60 min  Stress: Stress Concern Present (10/13/2023)   [employer] of Occupational Health - Occupational Stress Questionnaire    Feeling of Stress: To some extent  Social Connections: Socially Integrated (10/13/2023)   Social Connection and Isolation Panel    Frequency of Communication with Friends and Family: More than three times a week    Frequency of Social Gatherings with Friends and Family: More than three times a week    Attends Religious Services: More than 4 times per year    Active Member of [employer] or Organizations: Yes    Attends Engineer, Structural: More than 4 times per year    Marital Status: Married  Catering Manager  Violence: Not At Risk (04/29/2023)   Humiliation, Afraid, Rape, and Kick questionnaire    Fear of Current or Ex-Partner: No    Emotionally Abused: No    Physically Abused: No    Sexually Abused: No    Family History  Problem Relation Age of Onset   Heart failure Mother    Coronary artery disease Mother    Stroke Mother    Hypothyroidism Mother    Cancer Father        LUNG   Cancer Sister        BREAST   Heart failure Maternal Grandmother    Hypothyroidism Daughter    Colon cancer Neg Hx     Review of Systems:  As stated in the HPI and otherwise negative.   There were no vitals taken for this visit.  Physical Examination: General: Well developed, well nourished, NAD  HEENT: OP clear, mucus membranes moist  SKIN: warm, dry. No rashes. Neuro: No focal deficits  Musculoskeletal: Muscle strength 5/5 all ext  Psychiatric: Mood and affect normal  Neck: No JVD, no carotid bruits, no thyromegaly, no lymphadenopathy.  Lungs:Clear bilaterally, no wheezes, rhonci, crackles Cardiovascular: Regular rate and rhythm. No murmurs, gallops or rubs. Abdomen:Soft. Bowel sounds present. Non-tender.  Extremities: No lower extremity edema. Pulses are 2 + in the bilateral DP/PT.  EKG:  EKG is *** ordered today. The ekg ordered today demonstrates:   Recent Labs: 04/28/2023: Platelets 165 05/01/2023: Hemoglobin 12.6 05/29/2023: TSH 6.440 10/13/2023: ALT 13; BUN 22; Creatinine, Ser 1.04; Potassium 4.1; Sodium 139   Lipid Panel    Component Value Date/Time   CHOL 109 10/13/2023 0829   TRIG 95.0 10/13/2023 0829   HDL 35.20 (L) 10/13/2023 0829   CHOLHDL  3 10/13/2023 0829   VLDL 19.0 10/13/2023 0829   LDLCALC 55 10/13/2023 0829     Wt Readings from Last 3 Encounters:  01/20/24 209 lb 8 oz (95 kg)  12/02/23 211 lb 6.4 oz (95.9 kg)  10/20/23 211 lb (95.7 kg)    Assessment and Plan:   1. CAD without angina:  Mild non-obstructive CAD by cardiac cath in February 2025. No chest pain. Continue ASA  and statin.    2. Non-ischemic Cardiomyopathy/Chronic systolic CHF: LVEF around 35-40% on echo May 2025. No beta blocker secondary to bradycardia. Soft BP and dizziness on Entresto . Continue Ramipril .    3. Thoracic aortic aneurysm:  4.0 cm by CTA June 2025. Unchanged from prior study. Repeat June 2026.    4. HLD: LDL 55 in July 2025. Continue statin.    5. Carotid bruit, right: Carotid dopplers February 2025 with mild bilateral carotid plaque.   Labs/ tests ordered today include:  No orders of the defined types were placed in this encounter.  Disposition:   F/U with me in 12  months  Signed, Lonni Cash, MD 02/29/2024 2:14 PM    [facility] [address] [place], [place], [place]  [address] Phone: [phone]; Fax: [phone]

## 2024-03-01 ENCOUNTER — Ambulatory Visit: Attending: Cardiovascular Disease | Admitting: Cardiovascular Disease

## 2024-03-01 ENCOUNTER — Encounter: Payer: Self-pay | Admitting: Cardiovascular Disease

## 2024-03-01 ENCOUNTER — Ambulatory Visit: Admitting: Physician Assistant

## 2024-03-01 VITALS — BP 145/81 | HR 63 | Ht 69.5 in | Wt 204.0 lb

## 2024-03-01 DIAGNOSIS — I2510 Atherosclerotic heart disease of native coronary artery without angina pectoris: Secondary | ICD-10-CM

## 2024-03-01 DIAGNOSIS — I5022 Chronic systolic (congestive) heart failure: Secondary | ICD-10-CM | POA: Diagnosis not present

## 2024-03-01 DIAGNOSIS — I428 Other cardiomyopathies: Secondary | ICD-10-CM

## 2024-03-01 DIAGNOSIS — E785 Hyperlipidemia, unspecified: Secondary | ICD-10-CM | POA: Diagnosis not present

## 2024-03-01 DIAGNOSIS — I779 Disorder of arteries and arterioles, unspecified: Secondary | ICD-10-CM

## 2024-03-01 DIAGNOSIS — I712 Thoracic aortic aneurysm, without rupture, unspecified: Secondary | ICD-10-CM | POA: Diagnosis not present

## 2024-03-01 NOTE — Patient Instructions (Signed)

## 2024-03-19 ENCOUNTER — Other Ambulatory Visit: Payer: Self-pay | Admitting: Family Medicine

## 2024-03-28 ENCOUNTER — Encounter (INDEPENDENT_AMBULATORY_CARE_PROVIDER_SITE_OTHER): Admitting: Ophthalmology

## 2024-03-28 DIAGNOSIS — H35372 Puckering of macula, left eye: Secondary | ICD-10-CM | POA: Diagnosis not present

## 2024-03-28 DIAGNOSIS — I10 Essential (primary) hypertension: Secondary | ICD-10-CM | POA: Diagnosis not present

## 2024-03-28 DIAGNOSIS — H34812 Central retinal vein occlusion, left eye, with macular edema: Secondary | ICD-10-CM | POA: Diagnosis not present

## 2024-03-28 DIAGNOSIS — H43813 Vitreous degeneration, bilateral: Secondary | ICD-10-CM | POA: Diagnosis not present

## 2024-03-28 DIAGNOSIS — H35033 Hypertensive retinopathy, bilateral: Secondary | ICD-10-CM

## 2024-03-28 DIAGNOSIS — H348112 Central retinal vein occlusion, right eye, stable: Secondary | ICD-10-CM | POA: Diagnosis not present

## 2024-03-28 DIAGNOSIS — H353112 Nonexudative age-related macular degeneration, right eye, intermediate dry stage: Secondary | ICD-10-CM | POA: Diagnosis not present

## 2024-04-07 ENCOUNTER — Other Ambulatory Visit: Payer: Self-pay | Admitting: Family Medicine

## 2024-04-07 MED ORDER — FUROSEMIDE 40 MG PO TABS: 20.0000 mg | ORAL_TABLET | Freq: Every day | ORAL | 3 refills | Status: AC

## 2024-04-29 ENCOUNTER — Ambulatory Visit: Payer: PPO

## 2024-05-02 ENCOUNTER — Encounter (INDEPENDENT_AMBULATORY_CARE_PROVIDER_SITE_OTHER): Admitting: Ophthalmology

## 2024-07-12 ENCOUNTER — Other Ambulatory Visit

## 2024-07-19 ENCOUNTER — Ambulatory Visit: Admitting: Family Medicine

## 2024-07-21 ENCOUNTER — Ambulatory Visit
